# Patient Record
Sex: Female | Born: 1945 | Race: Black or African American | Hispanic: No | Smoking: Never smoker
Health system: Southern US, Community
[De-identification: ages and names within clinical notes are randomized; demographics above are authoritative.]

## PROBLEM LIST (undated history)

## (undated) DIAGNOSIS — I679 Cerebrovascular disease, unspecified: Secondary | ICD-10-CM

## (undated) DIAGNOSIS — I639 Cerebral infarction, unspecified: Secondary | ICD-10-CM

## (undated) DIAGNOSIS — E785 Hyperlipidemia, unspecified: Secondary | ICD-10-CM

## (undated) DIAGNOSIS — F1410 Cocaine abuse, uncomplicated: Secondary | ICD-10-CM

## (undated) DIAGNOSIS — R197 Diarrhea, unspecified: Secondary | ICD-10-CM

## (undated) DIAGNOSIS — I10 Essential (primary) hypertension: Secondary | ICD-10-CM

## (undated) HISTORY — DX: Diarrhea, unspecified: R19.7

## (undated) HISTORY — DX: Hyperlipidemia, unspecified: E78.5

## (undated) HISTORY — DX: Essential (primary) hypertension: I10

## (undated) HISTORY — DX: Cocaine abuse, uncomplicated: F14.10

## (undated) HISTORY — DX: Cerebrovascular disease, unspecified: I67.9

---

## 1968-02-07 HISTORY — PX: TOTAL HIP ARTHROPLASTY: SHX124

## 1997-12-21 ENCOUNTER — Inpatient Hospital Stay (HOSPITAL_COMMUNITY): Admission: RE | Admit: 1997-12-21 | Discharge: 1997-12-29 | Payer: Self-pay | Admitting: Orthopaedic Surgery

## 1997-12-29 ENCOUNTER — Inpatient Hospital Stay (HOSPITAL_COMMUNITY)
Admission: RE | Admit: 1997-12-29 | Discharge: 1998-01-06 | Payer: Self-pay | Admitting: Physical Medicine and Rehabilitation

## 1998-01-25 ENCOUNTER — Encounter: Admission: RE | Admit: 1998-01-25 | Discharge: 1998-02-25 | Payer: Self-pay | Admitting: Orthopaedic Surgery

## 1998-04-01 ENCOUNTER — Encounter: Admission: RE | Admit: 1998-04-01 | Discharge: 1998-06-30 | Payer: Self-pay | Admitting: Orthopaedic Surgery

## 1999-06-29 ENCOUNTER — Encounter: Admission: RE | Admit: 1999-06-29 | Discharge: 1999-08-18 | Payer: Self-pay | Admitting: Orthopaedic Surgery

## 2002-08-19 ENCOUNTER — Encounter: Payer: Self-pay | Admitting: Emergency Medicine

## 2002-08-19 ENCOUNTER — Emergency Department (HOSPITAL_COMMUNITY): Admission: EM | Admit: 2002-08-19 | Discharge: 2002-08-19 | Payer: Self-pay | Admitting: Emergency Medicine

## 2003-12-16 ENCOUNTER — Ambulatory Visit: Payer: Self-pay | Admitting: Family Medicine

## 2004-05-10 ENCOUNTER — Ambulatory Visit: Payer: Self-pay | Admitting: Family Medicine

## 2004-06-15 ENCOUNTER — Ambulatory Visit: Payer: Self-pay | Admitting: Family Medicine

## 2005-08-20 ENCOUNTER — Inpatient Hospital Stay (HOSPITAL_COMMUNITY): Admission: EM | Admit: 2005-08-20 | Discharge: 2005-08-24 | Payer: Self-pay | Admitting: Emergency Medicine

## 2005-08-20 ENCOUNTER — Encounter (INDEPENDENT_AMBULATORY_CARE_PROVIDER_SITE_OTHER): Payer: Self-pay | Admitting: Cardiology

## 2005-08-20 ENCOUNTER — Encounter: Payer: Self-pay | Admitting: Vascular Surgery

## 2005-08-25 ENCOUNTER — Ambulatory Visit: Payer: Self-pay | Admitting: Family Medicine

## 2005-08-28 ENCOUNTER — Encounter: Admission: RE | Admit: 2005-08-28 | Discharge: 2005-08-28 | Payer: Self-pay | Admitting: Internal Medicine

## 2005-09-21 ENCOUNTER — Ambulatory Visit: Payer: Self-pay | Admitting: Family Medicine

## 2005-10-12 ENCOUNTER — Ambulatory Visit: Payer: Self-pay | Admitting: Family Medicine

## 2007-05-29 ENCOUNTER — Ambulatory Visit: Payer: Self-pay | Admitting: Family Medicine

## 2007-05-29 LAB — CONVERTED CEMR LAB
ALT: 10 units/L (ref 0–35)
AST: 11 units/L (ref 0–37)
BUN: 13 mg/dL (ref 6–23)
Basophils Absolute: 0 10*3/uL (ref 0.0–0.1)
Basophils Relative: 0 % (ref 0–1)
Calcium: 9.8 mg/dL (ref 8.4–10.5)
Chloride: 100 meq/L (ref 96–112)
Creatinine, Ser: 0.75 mg/dL (ref 0.40–1.20)
Eosinophils Absolute: 0 10*3/uL (ref 0.0–0.7)
Eosinophils Relative: 1 % (ref 0–5)
HCT: 43.2 % (ref 36.0–46.0)
Hemoglobin: 14.3 g/dL (ref 12.0–15.0)
MCHC: 33.1 g/dL (ref 30.0–36.0)
MCV: 81.1 fL (ref 78.0–100.0)
Monocytes Absolute: 0.4 10*3/uL (ref 0.1–1.0)
Monocytes Relative: 6 % (ref 3–12)
RBC: 5.33 M/uL — ABNORMAL HIGH (ref 3.87–5.11)
RDW: 12.8 % (ref 11.5–15.5)
Total Bilirubin: 0.3 mg/dL (ref 0.3–1.2)

## 2007-05-30 ENCOUNTER — Encounter (INDEPENDENT_AMBULATORY_CARE_PROVIDER_SITE_OTHER): Payer: Self-pay | Admitting: Family Medicine

## 2007-06-03 ENCOUNTER — Telehealth (INDEPENDENT_AMBULATORY_CARE_PROVIDER_SITE_OTHER): Payer: Self-pay | Admitting: *Deleted

## 2007-06-04 ENCOUNTER — Ambulatory Visit: Payer: Self-pay | Admitting: Family Medicine

## 2007-06-04 DIAGNOSIS — R197 Diarrhea, unspecified: Secondary | ICD-10-CM

## 2007-06-04 DIAGNOSIS — F1410 Cocaine abuse, uncomplicated: Secondary | ICD-10-CM | POA: Insufficient documentation

## 2007-06-04 LAB — CONVERTED CEMR LAB: Hgb A1c MFr Bld: 12 %

## 2007-06-05 ENCOUNTER — Telehealth (INDEPENDENT_AMBULATORY_CARE_PROVIDER_SITE_OTHER): Payer: Self-pay | Admitting: *Deleted

## 2007-06-05 DIAGNOSIS — I10 Essential (primary) hypertension: Secondary | ICD-10-CM | POA: Insufficient documentation

## 2007-06-05 DIAGNOSIS — E1129 Type 2 diabetes mellitus with other diabetic kidney complication: Secondary | ICD-10-CM

## 2007-06-05 DIAGNOSIS — E785 Hyperlipidemia, unspecified: Secondary | ICD-10-CM

## 2007-06-05 DIAGNOSIS — I679 Cerebrovascular disease, unspecified: Secondary | ICD-10-CM

## 2007-07-02 ENCOUNTER — Telehealth (INDEPENDENT_AMBULATORY_CARE_PROVIDER_SITE_OTHER): Payer: Self-pay | Admitting: *Deleted

## 2007-08-21 ENCOUNTER — Telehealth (INDEPENDENT_AMBULATORY_CARE_PROVIDER_SITE_OTHER): Payer: Self-pay | Admitting: *Deleted

## 2007-08-29 ENCOUNTER — Encounter (INDEPENDENT_AMBULATORY_CARE_PROVIDER_SITE_OTHER): Payer: Self-pay | Admitting: *Deleted

## 2007-09-30 ENCOUNTER — Ambulatory Visit: Payer: Self-pay | Admitting: Family Medicine

## 2007-09-30 LAB — CONVERTED CEMR LAB: Blood Glucose, Fingerstick: 242

## 2008-06-11 DIAGNOSIS — I639 Cerebral infarction, unspecified: Secondary | ICD-10-CM | POA: Insufficient documentation

## 2008-06-12 ENCOUNTER — Ambulatory Visit: Payer: Self-pay | Admitting: Surgery

## 2008-06-12 ENCOUNTER — Ambulatory Visit: Payer: Self-pay | Admitting: Cardiovascular Disease

## 2008-06-12 ENCOUNTER — Inpatient Hospital Stay (HOSPITAL_COMMUNITY): Admission: EM | Admit: 2008-06-12 | Discharge: 2008-06-15 | Payer: Self-pay | Admitting: Emergency Medicine

## 2008-06-12 ENCOUNTER — Encounter (INDEPENDENT_AMBULATORY_CARE_PROVIDER_SITE_OTHER): Payer: Self-pay | Admitting: Internal Medicine

## 2008-06-15 ENCOUNTER — Encounter (INDEPENDENT_AMBULATORY_CARE_PROVIDER_SITE_OTHER): Payer: Self-pay | Admitting: Internal Medicine

## 2008-06-16 ENCOUNTER — Telehealth (INDEPENDENT_AMBULATORY_CARE_PROVIDER_SITE_OTHER): Payer: Self-pay | Admitting: Internal Medicine

## 2008-06-23 ENCOUNTER — Encounter (INDEPENDENT_AMBULATORY_CARE_PROVIDER_SITE_OTHER): Payer: Self-pay | Admitting: Family Medicine

## 2008-06-23 ENCOUNTER — Ambulatory Visit: Payer: Self-pay | Admitting: Internal Medicine

## 2008-06-23 LAB — CONVERTED CEMR LAB: Blood Glucose, Fingerstick: 85

## 2008-06-25 ENCOUNTER — Telehealth (INDEPENDENT_AMBULATORY_CARE_PROVIDER_SITE_OTHER): Payer: Self-pay | Admitting: Internal Medicine

## 2008-07-01 ENCOUNTER — Ambulatory Visit: Payer: Self-pay | Admitting: Internal Medicine

## 2008-07-03 ENCOUNTER — Emergency Department (HOSPITAL_COMMUNITY): Admission: EM | Admit: 2008-07-03 | Discharge: 2008-07-03 | Payer: Self-pay | Admitting: Emergency Medicine

## 2008-07-09 ENCOUNTER — Encounter (INDEPENDENT_AMBULATORY_CARE_PROVIDER_SITE_OTHER): Payer: Self-pay | Admitting: Internal Medicine

## 2008-07-27 ENCOUNTER — Encounter: Admission: RE | Admit: 2008-07-27 | Discharge: 2008-10-25 | Payer: Self-pay | Admitting: Internal Medicine

## 2008-07-31 ENCOUNTER — Ambulatory Visit: Payer: Self-pay | Admitting: Internal Medicine

## 2008-07-31 LAB — CONVERTED CEMR LAB: Hgb A1c MFr Bld: 9 %

## 2008-08-05 ENCOUNTER — Encounter (INDEPENDENT_AMBULATORY_CARE_PROVIDER_SITE_OTHER): Payer: Self-pay | Admitting: Internal Medicine

## 2008-08-06 ENCOUNTER — Encounter (INDEPENDENT_AMBULATORY_CARE_PROVIDER_SITE_OTHER): Payer: Self-pay | Admitting: Internal Medicine

## 2008-08-12 ENCOUNTER — Encounter (INDEPENDENT_AMBULATORY_CARE_PROVIDER_SITE_OTHER): Payer: Self-pay | Admitting: Internal Medicine

## 2008-08-12 ENCOUNTER — Telehealth (INDEPENDENT_AMBULATORY_CARE_PROVIDER_SITE_OTHER): Payer: Self-pay | Admitting: Internal Medicine

## 2008-08-26 ENCOUNTER — Encounter (INDEPENDENT_AMBULATORY_CARE_PROVIDER_SITE_OTHER): Payer: Self-pay | Admitting: Internal Medicine

## 2008-08-31 ENCOUNTER — Encounter (INDEPENDENT_AMBULATORY_CARE_PROVIDER_SITE_OTHER): Payer: Self-pay | Admitting: Internal Medicine

## 2008-09-11 ENCOUNTER — Encounter (INDEPENDENT_AMBULATORY_CARE_PROVIDER_SITE_OTHER): Payer: Self-pay | Admitting: Internal Medicine

## 2008-09-23 ENCOUNTER — Ambulatory Visit: Payer: Self-pay | Admitting: Infectious Disease

## 2008-09-23 ENCOUNTER — Inpatient Hospital Stay (HOSPITAL_COMMUNITY): Admission: EM | Admit: 2008-09-23 | Discharge: 2008-09-29 | Payer: Self-pay | Admitting: Emergency Medicine

## 2008-09-24 ENCOUNTER — Encounter (INDEPENDENT_AMBULATORY_CARE_PROVIDER_SITE_OTHER): Payer: Self-pay | Admitting: Internal Medicine

## 2008-09-25 ENCOUNTER — Encounter: Payer: Self-pay | Admitting: Infectious Disease

## 2008-09-25 ENCOUNTER — Ambulatory Visit: Payer: Self-pay | Admitting: Physical Medicine & Rehabilitation

## 2008-10-13 ENCOUNTER — Emergency Department (HOSPITAL_COMMUNITY): Admission: EM | Admit: 2008-10-13 | Discharge: 2008-10-14 | Payer: Self-pay | Admitting: Emergency Medicine

## 2008-12-04 ENCOUNTER — Encounter (INDEPENDENT_AMBULATORY_CARE_PROVIDER_SITE_OTHER): Payer: Self-pay | Admitting: Internal Medicine

## 2008-12-25 ENCOUNTER — Encounter (INDEPENDENT_AMBULATORY_CARE_PROVIDER_SITE_OTHER): Payer: Self-pay | Admitting: Internal Medicine

## 2008-12-25 ENCOUNTER — Telehealth (INDEPENDENT_AMBULATORY_CARE_PROVIDER_SITE_OTHER): Payer: Self-pay | Admitting: Internal Medicine

## 2008-12-25 DIAGNOSIS — R1310 Dysphagia, unspecified: Secondary | ICD-10-CM | POA: Insufficient documentation

## 2009-01-05 ENCOUNTER — Telehealth (INDEPENDENT_AMBULATORY_CARE_PROVIDER_SITE_OTHER): Payer: Self-pay | Admitting: Internal Medicine

## 2009-01-08 ENCOUNTER — Telehealth (INDEPENDENT_AMBULATORY_CARE_PROVIDER_SITE_OTHER): Payer: Self-pay | Admitting: Internal Medicine

## 2009-01-12 ENCOUNTER — Encounter (INDEPENDENT_AMBULATORY_CARE_PROVIDER_SITE_OTHER): Payer: Self-pay | Admitting: Internal Medicine

## 2009-01-14 ENCOUNTER — Ambulatory Visit: Payer: Self-pay | Admitting: Internal Medicine

## 2009-01-14 DIAGNOSIS — G4700 Insomnia, unspecified: Secondary | ICD-10-CM | POA: Insufficient documentation

## 2009-01-14 DIAGNOSIS — H814 Vertigo of central origin: Secondary | ICD-10-CM

## 2009-01-14 LAB — CONVERTED CEMR LAB: Blood Glucose, Fingerstick: 177

## 2009-01-15 ENCOUNTER — Encounter (INDEPENDENT_AMBULATORY_CARE_PROVIDER_SITE_OTHER): Payer: Self-pay | Admitting: Internal Medicine

## 2009-01-22 ENCOUNTER — Encounter (INDEPENDENT_AMBULATORY_CARE_PROVIDER_SITE_OTHER): Payer: Self-pay | Admitting: Internal Medicine

## 2009-01-22 ENCOUNTER — Telehealth (INDEPENDENT_AMBULATORY_CARE_PROVIDER_SITE_OTHER): Payer: Self-pay | Admitting: Internal Medicine

## 2009-01-25 ENCOUNTER — Ambulatory Visit: Admission: RE | Admit: 2009-01-25 | Discharge: 2009-01-25 | Payer: Self-pay | Admitting: Internal Medicine

## 2009-01-25 ENCOUNTER — Ambulatory Visit (HOSPITAL_COMMUNITY): Admission: RE | Admit: 2009-01-25 | Discharge: 2009-01-25 | Payer: Self-pay | Admitting: Internal Medicine

## 2009-01-25 LAB — CONVERTED CEMR LAB
AST: 19 units/L (ref 0–37)
Alkaline Phosphatase: 53 units/L (ref 39–117)
BUN: 24 mg/dL — ABNORMAL HIGH (ref 6–23)
Glucose, Bld: 169 mg/dL — ABNORMAL HIGH (ref 70–99)
HDL: 56 mg/dL (ref >39)
LDL Cholesterol: 114 mg/dL — ABNORMAL HIGH (ref 0–99)
Total Bilirubin: 0.3 mg/dL (ref 0.3–1.2)
Total CHOL/HDL Ratio: 3.5
Triglycerides: 132 mg/dL (ref <150)
VLDL: 26 mg/dL (ref 0–40)

## 2009-01-27 ENCOUNTER — Encounter (INDEPENDENT_AMBULATORY_CARE_PROVIDER_SITE_OTHER): Payer: Self-pay | Admitting: Internal Medicine

## 2009-02-01 ENCOUNTER — Encounter (INDEPENDENT_AMBULATORY_CARE_PROVIDER_SITE_OTHER): Payer: Self-pay | Admitting: Internal Medicine

## 2009-02-09 ENCOUNTER — Ambulatory Visit: Payer: Self-pay | Admitting: Internal Medicine

## 2009-02-09 LAB — CONVERTED CEMR LAB
Basophils Absolute: 0 10*3/uL (ref 0.0–0.1)
Basophils Relative: 0 % (ref 0–1)
Eosinophils Absolute: 0 10*3/uL (ref 0.0–0.7)
MCHC: 31.6 g/dL (ref 30.0–36.0)
MCV: 84.6 fL (ref 78.0–100.0)
Monocytes Relative: 10 % (ref 3–12)
Neutrophils Relative %: 47 % (ref 43–77)
Platelets: 318 10*3/uL (ref 150–400)
RDW: 13 % (ref 11.5–15.5)

## 2009-02-10 ENCOUNTER — Encounter (INDEPENDENT_AMBULATORY_CARE_PROVIDER_SITE_OTHER): Payer: Self-pay | Admitting: Internal Medicine

## 2009-02-17 ENCOUNTER — Encounter (INDEPENDENT_AMBULATORY_CARE_PROVIDER_SITE_OTHER): Payer: Self-pay | Admitting: Internal Medicine

## 2009-02-22 ENCOUNTER — Telehealth (INDEPENDENT_AMBULATORY_CARE_PROVIDER_SITE_OTHER): Payer: Self-pay | Admitting: Internal Medicine

## 2009-02-23 ENCOUNTER — Encounter (INDEPENDENT_AMBULATORY_CARE_PROVIDER_SITE_OTHER): Payer: Self-pay | Admitting: Internal Medicine

## 2009-02-25 ENCOUNTER — Encounter (INDEPENDENT_AMBULATORY_CARE_PROVIDER_SITE_OTHER): Payer: Self-pay | Admitting: Internal Medicine

## 2009-02-28 ENCOUNTER — Encounter (INDEPENDENT_AMBULATORY_CARE_PROVIDER_SITE_OTHER): Payer: Self-pay | Admitting: Internal Medicine

## 2009-03-08 ENCOUNTER — Telehealth (INDEPENDENT_AMBULATORY_CARE_PROVIDER_SITE_OTHER): Payer: Self-pay | Admitting: Internal Medicine

## 2009-03-11 ENCOUNTER — Encounter (INDEPENDENT_AMBULATORY_CARE_PROVIDER_SITE_OTHER): Payer: Self-pay | Admitting: Internal Medicine

## 2009-03-25 ENCOUNTER — Telehealth (INDEPENDENT_AMBULATORY_CARE_PROVIDER_SITE_OTHER): Payer: Self-pay | Admitting: Internal Medicine

## 2009-04-02 ENCOUNTER — Telehealth (INDEPENDENT_AMBULATORY_CARE_PROVIDER_SITE_OTHER): Payer: Self-pay | Admitting: Internal Medicine

## 2009-04-06 ENCOUNTER — Encounter (INDEPENDENT_AMBULATORY_CARE_PROVIDER_SITE_OTHER): Payer: Self-pay | Admitting: Internal Medicine

## 2009-04-06 ENCOUNTER — Telehealth (INDEPENDENT_AMBULATORY_CARE_PROVIDER_SITE_OTHER): Payer: Self-pay | Admitting: Internal Medicine

## 2009-04-14 ENCOUNTER — Encounter (INDEPENDENT_AMBULATORY_CARE_PROVIDER_SITE_OTHER): Payer: Self-pay | Admitting: Internal Medicine

## 2009-04-20 ENCOUNTER — Encounter: Admission: RE | Admit: 2009-04-20 | Discharge: 2009-05-24 | Payer: Self-pay | Admitting: Internal Medicine

## 2009-04-20 ENCOUNTER — Encounter (INDEPENDENT_AMBULATORY_CARE_PROVIDER_SITE_OTHER): Payer: Self-pay | Admitting: Internal Medicine

## 2009-04-26 ENCOUNTER — Encounter (INDEPENDENT_AMBULATORY_CARE_PROVIDER_SITE_OTHER): Payer: Self-pay | Admitting: Internal Medicine

## 2009-05-19 ENCOUNTER — Encounter (INDEPENDENT_AMBULATORY_CARE_PROVIDER_SITE_OTHER): Payer: Self-pay | Admitting: Internal Medicine

## 2009-06-24 ENCOUNTER — Ambulatory Visit: Payer: Self-pay | Admitting: Internal Medicine

## 2009-06-24 DIAGNOSIS — F329 Major depressive disorder, single episode, unspecified: Secondary | ICD-10-CM

## 2009-06-24 DIAGNOSIS — F3289 Other specified depressive episodes: Secondary | ICD-10-CM | POA: Insufficient documentation

## 2009-06-24 DIAGNOSIS — M25559 Pain in unspecified hip: Secondary | ICD-10-CM | POA: Insufficient documentation

## 2009-06-29 ENCOUNTER — Ambulatory Visit (HOSPITAL_COMMUNITY): Admission: RE | Admit: 2009-06-29 | Discharge: 2009-06-29 | Payer: Self-pay | Admitting: Internal Medicine

## 2009-07-03 DIAGNOSIS — M1610 Unilateral primary osteoarthritis, unspecified hip: Secondary | ICD-10-CM | POA: Insufficient documentation

## 2009-07-03 DIAGNOSIS — M169 Osteoarthritis of hip, unspecified: Secondary | ICD-10-CM | POA: Insufficient documentation

## 2009-07-03 LAB — CONVERTED CEMR LAB
LDL Cholesterol: 111 mg/dL — ABNORMAL HIGH (ref 0–99)
Triglycerides: 166 mg/dL — ABNORMAL HIGH (ref <150)
VLDL: 33 mg/dL (ref 0–40)

## 2009-07-06 ENCOUNTER — Ambulatory Visit: Payer: Self-pay | Admitting: Internal Medicine

## 2009-07-07 ENCOUNTER — Encounter (INDEPENDENT_AMBULATORY_CARE_PROVIDER_SITE_OTHER): Payer: Self-pay | Admitting: Internal Medicine

## 2009-07-13 ENCOUNTER — Encounter (INDEPENDENT_AMBULATORY_CARE_PROVIDER_SITE_OTHER): Payer: Self-pay | Admitting: Internal Medicine

## 2009-07-21 ENCOUNTER — Encounter: Admission: RE | Admit: 2009-07-21 | Discharge: 2009-10-19 | Payer: Self-pay | Admitting: Internal Medicine

## 2009-07-23 ENCOUNTER — Encounter (INDEPENDENT_AMBULATORY_CARE_PROVIDER_SITE_OTHER): Payer: Self-pay | Admitting: Internal Medicine

## 2009-07-26 ENCOUNTER — Telehealth (INDEPENDENT_AMBULATORY_CARE_PROVIDER_SITE_OTHER): Payer: Self-pay | Admitting: Internal Medicine

## 2009-07-28 ENCOUNTER — Telehealth (INDEPENDENT_AMBULATORY_CARE_PROVIDER_SITE_OTHER): Payer: Self-pay | Admitting: Internal Medicine

## 2009-07-29 ENCOUNTER — Encounter (INDEPENDENT_AMBULATORY_CARE_PROVIDER_SITE_OTHER): Payer: Self-pay | Admitting: Internal Medicine

## 2009-08-05 ENCOUNTER — Telehealth (INDEPENDENT_AMBULATORY_CARE_PROVIDER_SITE_OTHER): Payer: Self-pay | Admitting: Internal Medicine

## 2009-08-10 ENCOUNTER — Encounter (INDEPENDENT_AMBULATORY_CARE_PROVIDER_SITE_OTHER): Payer: Self-pay | Admitting: Internal Medicine

## 2009-08-17 ENCOUNTER — Encounter (INDEPENDENT_AMBULATORY_CARE_PROVIDER_SITE_OTHER): Payer: Self-pay | Admitting: Internal Medicine

## 2009-09-02 ENCOUNTER — Encounter (INDEPENDENT_AMBULATORY_CARE_PROVIDER_SITE_OTHER): Payer: Self-pay | Admitting: Internal Medicine

## 2009-09-15 ENCOUNTER — Encounter (INDEPENDENT_AMBULATORY_CARE_PROVIDER_SITE_OTHER): Payer: Self-pay | Admitting: Internal Medicine

## 2009-09-21 ENCOUNTER — Encounter (INDEPENDENT_AMBULATORY_CARE_PROVIDER_SITE_OTHER): Payer: Self-pay | Admitting: Internal Medicine

## 2009-09-27 ENCOUNTER — Encounter (INDEPENDENT_AMBULATORY_CARE_PROVIDER_SITE_OTHER): Payer: Self-pay | Admitting: Internal Medicine

## 2009-10-01 ENCOUNTER — Encounter (INDEPENDENT_AMBULATORY_CARE_PROVIDER_SITE_OTHER): Payer: Self-pay | Admitting: Internal Medicine

## 2009-10-01 ENCOUNTER — Ambulatory Visit (HOSPITAL_COMMUNITY): Admission: RE | Admit: 2009-10-01 | Discharge: 2009-10-01 | Payer: Self-pay | Admitting: Internal Medicine

## 2009-10-12 ENCOUNTER — Encounter (INDEPENDENT_AMBULATORY_CARE_PROVIDER_SITE_OTHER): Payer: Self-pay | Admitting: Internal Medicine

## 2009-11-16 ENCOUNTER — Telehealth (INDEPENDENT_AMBULATORY_CARE_PROVIDER_SITE_OTHER): Payer: Self-pay | Admitting: Internal Medicine

## 2009-11-16 ENCOUNTER — Ambulatory Visit: Payer: Self-pay | Admitting: Internal Medicine

## 2009-11-16 DIAGNOSIS — K5900 Constipation, unspecified: Secondary | ICD-10-CM | POA: Insufficient documentation

## 2009-11-18 ENCOUNTER — Encounter (INDEPENDENT_AMBULATORY_CARE_PROVIDER_SITE_OTHER): Payer: Self-pay | Admitting: Internal Medicine

## 2009-11-23 ENCOUNTER — Ambulatory Visit: Payer: Self-pay | Admitting: Internal Medicine

## 2009-11-25 ENCOUNTER — Telehealth (INDEPENDENT_AMBULATORY_CARE_PROVIDER_SITE_OTHER): Payer: Self-pay | Admitting: Internal Medicine

## 2009-11-25 ENCOUNTER — Emergency Department (HOSPITAL_COMMUNITY): Admission: EM | Admit: 2009-11-25 | Discharge: 2009-11-25 | Payer: Self-pay | Admitting: Emergency Medicine

## 2009-11-29 LAB — HM DIABETES EYE EXAM

## 2009-12-02 ENCOUNTER — Telehealth (INDEPENDENT_AMBULATORY_CARE_PROVIDER_SITE_OTHER): Payer: Self-pay | Admitting: Internal Medicine

## 2009-12-09 ENCOUNTER — Encounter (INDEPENDENT_AMBULATORY_CARE_PROVIDER_SITE_OTHER): Payer: Self-pay | Admitting: *Deleted

## 2009-12-10 ENCOUNTER — Encounter (INDEPENDENT_AMBULATORY_CARE_PROVIDER_SITE_OTHER): Payer: Self-pay | Admitting: Internal Medicine

## 2009-12-10 ENCOUNTER — Inpatient Hospital Stay (HOSPITAL_COMMUNITY): Admission: EM | Admit: 2009-12-10 | Discharge: 2009-12-13 | Payer: Self-pay | Admitting: Emergency Medicine

## 2009-12-14 ENCOUNTER — Inpatient Hospital Stay (HOSPITAL_COMMUNITY): Admission: EM | Admit: 2009-12-14 | Discharge: 2009-12-17 | Payer: Self-pay | Admitting: Emergency Medicine

## 2009-12-15 ENCOUNTER — Ambulatory Visit: Payer: Self-pay | Admitting: Vascular Surgery

## 2009-12-15 ENCOUNTER — Encounter (INDEPENDENT_AMBULATORY_CARE_PROVIDER_SITE_OTHER): Payer: Self-pay | Admitting: Internal Medicine

## 2009-12-16 ENCOUNTER — Encounter (INDEPENDENT_AMBULATORY_CARE_PROVIDER_SITE_OTHER): Payer: Self-pay | Admitting: Internal Medicine

## 2009-12-16 ENCOUNTER — Ambulatory Visit: Payer: Self-pay | Admitting: Internal Medicine

## 2009-12-17 ENCOUNTER — Telehealth (INDEPENDENT_AMBULATORY_CARE_PROVIDER_SITE_OTHER): Payer: Self-pay | Admitting: Internal Medicine

## 2009-12-29 ENCOUNTER — Telehealth (INDEPENDENT_AMBULATORY_CARE_PROVIDER_SITE_OTHER): Payer: Self-pay | Admitting: Internal Medicine

## 2010-01-03 ENCOUNTER — Ambulatory Visit: Payer: Self-pay | Admitting: Internal Medicine

## 2010-01-03 LAB — CONVERTED CEMR LAB: Blood Glucose, Fingerstick: 364

## 2010-01-03 LAB — HM DIABETES FOOT EXAM

## 2010-01-05 ENCOUNTER — Telehealth: Payer: Self-pay | Admitting: Internal Medicine

## 2010-01-07 ENCOUNTER — Encounter (INDEPENDENT_AMBULATORY_CARE_PROVIDER_SITE_OTHER): Payer: Self-pay | Admitting: Internal Medicine

## 2010-01-11 ENCOUNTER — Encounter: Payer: Self-pay | Admitting: Internal Medicine

## 2010-01-17 ENCOUNTER — Telehealth: Payer: Self-pay | Admitting: Internal Medicine

## 2010-01-24 ENCOUNTER — Telehealth: Payer: Self-pay | Admitting: Internal Medicine

## 2010-01-25 ENCOUNTER — Ambulatory Visit: Payer: Self-pay | Admitting: Internal Medicine

## 2010-01-25 DIAGNOSIS — H01009 Unspecified blepharitis unspecified eye, unspecified eyelid: Secondary | ICD-10-CM | POA: Insufficient documentation

## 2010-02-15 ENCOUNTER — Ambulatory Visit: Admit: 2010-02-15 | Payer: Self-pay | Admitting: Internal Medicine

## 2010-02-27 ENCOUNTER — Encounter: Payer: Self-pay | Admitting: Internal Medicine

## 2010-03-07 ENCOUNTER — Ambulatory Visit: Admit: 2010-03-07 | Payer: Self-pay | Admitting: Internal Medicine

## 2010-03-10 NOTE — Letter (Signed)
Summary: SUMMARY REPORT  SUMMARY REPORT   Imported By: Roland Earl 04/26/2009 09:12:32  _____________________________________________________________________  External Attachment:    Type:   Image     Comment:   External Document

## 2010-03-10 NOTE — Letter (Signed)
Summary: REFERRAL//[facility]ECH [facility]ERAP[facility]//APPT DATE & TIME  REFERRAL//[facility]//APPT DATE & TIME   Imported By: Roland Earl 07/19/2009 15:00:38  _____________________________________________________________________  External Attachment:    Type:   Image     Comment:   External Document

## 2010-03-10 NOTE — Letter (Signed)
Summary: [facility] FEEDBACK   Imported By: Roland Earl 07/09/2009 09:54:29  _____________________________________________________________________  External Attachment:    Type:   Image     Comment:   External Document

## 2010-03-10 NOTE — Progress Notes (Signed)
Summary: Rx refill req  Phone Note Call from Patient   Caller: [phone] - Daughter Rosaria Ferries  Summary of Call: Daughter is req a call back regarding pt's medications.  Initial call taken by: Charlsie Quest, [place],  January 05, 2010 3:46 PM  Follow-up for Phone Call        Pt's daughter called requesting refill of Amplodipine Follow-up by: Crissie Sickles, CMA,  January 06, 2010 11:13 AM    Prescriptions: AMLODIPINE BESYLATE 10 MG TABS (AMLODIPINE BESYLATE) 1 by mouth once daily  #30 x 11   Entered by:   Crissie Sickles, CMA   Authorized by:   Janith Lima MD   Signed by:   Crissie Sickles, CMA on 01/06/2010   Method used:   [facility]        [facility] [address] [facility] [address] [phone]* (retail)       [address]       [place], [place]  [address]       Ph: [phone] or [phone]       Fax: [phone]   RxID:   [id number]

## 2010-03-10 NOTE — Letter (Signed)
Summary: DURABLE MEDICAL EQUIPMENT //FAXED  DURABLE MEDICAL EQUIPMENT //FAXED   Imported By: Roland Earl 01/07/2010 12:18:27  _____________________________________________________________________  External Attachment:    Type:   Image     Comment:   External Document

## 2010-03-10 NOTE — Letter (Signed)
Summary: TEST ORDER FORM//MOD BARIUM SWALLOW //APPT DATE & TIME  TEST ORDER FORM//MOD BARIUM SWALLOW //APPT DATE & TIME   Imported By: Roland Earl 03/02/2009 14:59:33  _____________________________________________________________________  External Attachment:    Type:   Image     Comment:   External Document

## 2010-03-10 NOTE — Letter (Signed)
Summary: FAXED [facility]//PRESCRIPTION FOR COMMODE  FAXED [facility]//PRESCRIPTION FOR COMMODE   Imported By: Roland Earl 04/26/2009 09:19:17  _____________________________________________________________________  External Attachment:    Type:   Image     Comment:   External Document

## 2010-03-10 NOTE — Letter (Signed)
Summary: PT PROGRESS REPORT//[facility]  PT PROGRESS REPORT//[facility]   Imported By: Roland Earl 03/18/2009 15:56:11  _____________________________________________________________________  External Attachment:    Type:   Image     Comment:   External Document

## 2010-03-10 NOTE — Progress Notes (Signed)
Summary: Query:  Refill Meclizine?  Phone Note Outgoing Call   Summary of Call: Do you want to refill her meclizine for 6 months through [facility]?  Last seen 11/2009. Initial call taken by: Sherian Maroon RN,  December 17, 2009 11:05 AM  Follow-up for Phone Call        That's fine. Follow-up by: Mack Hook MD,  December 17, 2009 1:47 PM  Additional Follow-up for Phone Call Additional follow up Details #1::        Noted.  Refill completed.  Sherian Maroon RN  December 17, 2009 4:41 PM     Prescriptions: MECLIZINE HCL 25 MG TABS (MECLIZINE HCL) 1 by mouth q 6hours as needed  #30 x 5   Entered by:   Sherian Maroon RN   Authorized by:   Mack Hook MD   Signed by:   Sherian Maroon RN on 12/17/2009   Method used:   Historical   RxID:   [id number]

## 2010-03-10 NOTE — Letter (Signed)
Summary: [facility]//OPHTHALOLOGY  [facility]//OPHTHALOLOGY   Imported By: Roland Earl 01/07/2010 12:31:54  _____________________________________________________________________  External Attachment:    Type:   Image     Comment:   External Document

## 2010-03-10 NOTE — Letter (Signed)
Summary: PERSONAL CARE SERVICES  PERSONAL CARE SERVICES   Imported By: Roland Earl 07/06/2009 11:00:05  _____________________________________________________________________  External Attachment:    Type:   Image     Comment:   External Document

## 2010-03-10 NOTE — Letter (Signed)
Summary: BARIUM SWALLOW STUDY  BARIUM SWALLOW STUDY   Imported By: Roland Earl 07/06/2009 11:27:54  _____________________________________________________________________  External Attachment:    Type:   Image     Comment:   External Document

## 2010-03-10 NOTE — Progress Notes (Signed)
  Phone Note Outgoing Call   Summary of Call: Called Speech Therapy last week--pt. actually not in need of another study to evaluate swallowing, but reeducation as she is now living with another daughter that is not aware of what she is supposed to be doing with dysphagia diet.  Called and spoke with Speech again today and they will take care of it.   Initial call taken by: Mack Hook MD,  July 26, 2009 8:27 AM

## 2010-03-10 NOTE — Miscellaneous (Signed)
Summary: [facility]   Imported By: Roland Earl 06/24/2009 12:55:46  _____________________________________________________________________  External Attachment:    Type:   Image     Comment:   External Document

## 2010-03-10 NOTE — Assessment & Plan Note (Signed)
Summary: eye infection?/SD   Vital Signs:  Patient profile:   65 year old female Menstrual status:  postmenopausal Height:      60.5 inches Weight:      137 pounds BMI:     26.41 O2 Sat:      97 % on Room air Temp:     98.5 degrees F rectal Pulse rate:   64 / minute Pulse rhythm:   regular Resp:     16 per minute BP sitting:   116 / 68  (left arm) Cuff size:   large  Vitals Entered By: Estell Harpin CMA (January 25, 2010 8:52 AM)  Nutrition Counseling: Patient's BMI is greater than 25 and therefore counseled on weight management options.  O2 Flow:  Room air CC: Patient c/o watery, sore eays w/ redness, Hypertension Management Is Patient Diabetic? Yes Did you bring your meter with you today? No  Does patient need assistance? Functional Status Self care Ambulation Normal     Menstrual Status postmenopausal   Primary Care Nakina Spatz:  Janith Lima MD  CC:  Patient c/o watery, sore eays w/ redness, and Hypertension Management.  History of Present Illness: She returns c/o redness and irritation in both eyes for several months but she states that it has recently worsened. She tells me that the symptoms started after Dr. Zadie Rhine did a surgery on the right eye to repair some diabetic damage. She is putting a tear replacement drop in her eyes but she denies using any other eye drops, she does not wear contacts, and has not had an eye injury.  Hypertension History:      She denies headache, chest pain, palpitations, dyspnea with exertion, orthopnea, PND, peripheral edema, visual symptoms, neurologic problems, syncope, and side effects from treatment.  She notes no problems with any antihypertensive medication side effects.        Positive major cardiovascular risk factors include female age 50 years old or older, diabetes, hyperlipidemia, and hypertension.  Negative major cardiovascular risk factors include negative family history for ischemic heart disease and non-tobacco-user  status.        Positive history for target organ damage include prior stroke (or TIA).  Further assessment for target organ damage reveals no history of ASHD, cardiac end-organ damage (CHF/LVH), peripheral vascular disease, renal insufficiency, or hypertensive retinopathy.    Current Medications (verified): 1)  Glucometer and Supplies .... 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 Take 1 Tablet By Mouth Once A Day For Stroke Prevention 3)  Amlodipine Besylate 10 Mg Tabs (Amlodipine Besylate) .Marland Kitchen.. 1 By Mouth Once Daily 4)  Glipizide 10 Mg Tabs (Glipizide) .... 1/2 Tab By Mouth Two Times A Day 5)  Metformin Hcl 1000 Mg Tabs (Metformin Hcl) .Marland Kitchen.. 1 Tab By Mouth Two Times A Day 6)  Benazepril Hcl 10 Mg Tabs (Benazepril Hcl) .Marland Kitchen.. 1 Tab By Mouth Daily 7)  Hydrochlorothiazide 12.5 Mg Tabs (Hydrochlorothiazide) .Marland Kitchen.. 1 Tab By Mouth in Morning. 8)  Actos 30 Mg Tabs (Pioglitazone Hcl) .Marland Kitchen.. 1 Tab By Mouth Daily 9)  Plavix 75 Mg Tabs (Clopidogrel Bisulfate) .Marland Kitchen.. 1 By Mouth Once Daily 10)  Meclizine Hcl 25 Mg Tabs (Meclizine Hcl) .Marland Kitchen.. 1 By Mouth Q 6hours As Needed 11)  One Touch Ultra Test Strips .... Test Three Times A Day 12)  Lorazepam 1 Mg Tabs (Lorazepam) .... 1/2 To 1 Tab By Mouth Every 8 Hours As Needed For Anxiety 13)  Tub Bench .Marland Kitchen.. 386.2 and V12.59 14)  Miralax  Powd (Polyethylene Glycol 3350) .Marland KitchenMarland KitchenMarland Kitchen 17 G in 8 Oz Fluid By Mouth Daily--Decrease To 1/2 Dose If Stools Become Too Loose 15)  Flomax 0.4 Mg Caps (Tamsulosin Hcl) .... Take 1 Tablet By Mouth Once A Day  Allergies (verified): 1)  ! Morphine 2)  ! Simvastatin 3)  Pravastatin Sodium (Pravastatin Sodium)  Past History:  Past Medical History: Last updated: 06/04/2007 Current Problems:  HYPERLIPIDEMIA (ICD-272.4) CEREBROVASCULAR DISEASE (ICD-437.9) DIARRHEA, ACUTE (ICD-787.91) COCAINE ABUSE (ICD-305.60) ESSENTIAL HYPERTENSION, BENIGN (ICD-401.1) DIABETES MELLITUS, TYPE II,  UNCONTROLLED (ICD-250.02)  Past Surgical History: Last updated: 07/03/2009 1.  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 Dr.Mark Lorin Mercy  Family History: Last updated: 01/03/2010 Family History Diabetes 1st degree relative Family History of Stroke F 1st degree relative <60 Family History of Stroke M 1st degree relative <50  Social History: Last updated: 01/03/2010 Retired Divorced Never Smoked Alcohol use-no Drug use-no Regular exercise-yes  Risk Factors: Alcohol Use: 0 (01/14/2009) Caffeine Use: not every day (06/04/2007) Exercise: yes (01/03/2010)  Risk Factors: Smoking Status: never (01/03/2010)  Family History: Reviewed history from 01/03/2010 and no changes required. Family History Diabetes 1st degree relative Family History of Stroke F 1st degree relative <60 Family History of Stroke M 1st degree relative <50  Social History: Reviewed history from 01/03/2010 and no changes required. Retired Divorced Never Smoked Alcohol use-no Drug use-no Regular exercise-yes  Review of Systems  The patient denies anorexia, fever, weight loss, weight gain, chest pain, syncope, dyspnea on exertion, peripheral edema, prolonged cough, headaches, hemoptysis, abdominal pain, suspicious skin lesions, transient blindness, unusual weight change, abnormal bleeding, and enlarged lymph nodes.    Physical Exam  General:  alert, well-developed, well-nourished, well-hydrated, appropriate dress, normal appearance, healthy-appearing, cooperative to examination, and good hygiene.   Head:  she has multiple healed facial lesions that are small hypopigmented papules and two small scabs over her left eyebrow. there are no vesicles, pustules, or targets today. there is no swelling or angioedema. Eyes:  both eyes are injected with involvement of the lid margins and palpebral conjunctival surfaces, there is no exudate and no lesions on the corneal epithelial surfaces or the lid margins. there is no scleritis or  episcleritis Ears:  R ear normal and L ear normal.   Mouth:  Oral mucosa and oropharynx without lesions or exudates.  Teeth in good repair. Neck:  supple, full ROM, no masses, no thyromegaly, no thyroid nodules or tenderness, no JVD, normal carotid upstroke, no carotid bruits, no cervical lymphadenopathy, and no neck tenderness.   Lungs:  normal respiratory effort, no intercostal retractions, no accessory muscle use, normal breath sounds, no dullness, no fremitus, no crackles, and no wheezes.   Heart:  normal rate, regular rhythm, no murmur, no gallop, no rub, and no JVD.   Abdomen:  soft, non-tender, normal bowel sounds, no distention, no masses, no guarding, no rigidity, no rebound tenderness, no abdominal hernia, no inguinal hernia, no hepatomegaly, and no splenomegaly.   Msk:  No deformity or scoliosis noted of thoracic or lumbar spine.   Pulses:  R and L carotid,radial,femoral,dorsalis pedis and posterior tibial pulses are full and equal bilaterally Extremities:  No clubbing, cyanosis, edema, or deformity noted with normal full range of motion of all joints.   Neurologic:  alert & oriented X3, cranial nerves II-XII intact, Romberg negative, abnormal gait, LUE hyperreflexia, LUE weakness, LLE hyperreflexia, and LLE weakness.   Skin:  turgor normal, no suspicious lesions, no ecchymoses, no petechiae,  no purpura, no ulcerations, and no edema.   Cervical Nodes:  no anterior cervical adenopathy and no posterior cervical adenopathy.   Axillary Nodes:  no R axillary adenopathy and no L axillary adenopathy.   Psych:  Oriented X3, memory intact for recent and remote, normally interactive, good eye contact, not anxious appearing, not depressed appearing, not agitated, not suicidal, and not homicidal.     Impression & Recommendations:  Problem # 1:  BLEPHARITIS, UNSPECIFIED (ICD-373.00) Assessment New start blephamide drops and see Dr. Zadie Rhine soon Orders: Ophthalmology Referral  (Ophthalmology)  Problem # 2:  ESSENTIAL HYPERTENSION, BENIGN (ICD-401.1) Assessment: Improved  Her updated medication list for this problem includes:    Amlodipine Besylate 10 Mg Tabs (Amlodipine besylate) .Marland Kitchen... 1 by mouth once daily    Benazepril Hcl 10 Mg Tabs (Benazepril hcl) .Marland Kitchen... 1 tab by mouth daily    Hydrochlorothiazide 12.5 Mg Tabs (Hydrochlorothiazide) .Marland Kitchen... 1 tab by mouth in morning.  BP today: 116/68 Prior BP: 142/74 (01/03/2010)  Labs Reviewed: K+: 5.1 (01/14/2009) Creat: : 1.03 (01/14/2009)   Chol: 204 (06/24/2009)   HDL: 60 (06/24/2009)   LDL: 111 (06/24/2009)   TG: 166 (06/24/2009)  Complete Medication List: 1)  Glucometer and Supplies  .... Check cbg two times a day and record dx=iddm uncontrolled 2)  Baby Aspirin 81 Mg Chew (Aspirin) .... Take 1 tablet by mouth once a day for stroke prevention 3)  Amlodipine Besylate 10 Mg Tabs (Amlodipine besylate) .Marland Kitchen.. 1 by mouth once daily 4)  Glipizide 10 Mg Tabs (Glipizide) .... 1/2 tab by mouth two times a day 5)  Metformin Hcl 1000 Mg Tabs (Metformin hcl) .Marland Kitchen.. 1 tab by mouth two times a day 6)  Benazepril Hcl 10 Mg Tabs (Benazepril hcl) .Marland Kitchen.. 1 tab by mouth daily 7)  Hydrochlorothiazide 12.5 Mg Tabs (Hydrochlorothiazide) .Marland Kitchen.. 1 tab by mouth in morning. 8)  Plavix 75 Mg Tabs (Clopidogrel bisulfate) .Marland Kitchen.. 1 by mouth once daily 9)  Meclizine Hcl 25 Mg Tabs (Meclizine hcl) .Marland Kitchen.. 1 by mouth q 6hours as needed 10)  One Touch Ultra Test Strips  .... Test three times a day 11)  Lorazepam 1 Mg Tabs (Lorazepam) .... 1/2 to 1 tab by mouth every 8 hours as needed for anxiety 12)  Tub Bench  .Marland Kitchen.. 386.2 and v12.59 13)  Miralax Powd (Polyethylene glycol 3350) .Marland KitchenMarland KitchenMarland Kitchen 17 g in 8 oz fluid by mouth daily--decrease to 1/2 dose if stools become too loose 14)  Flomax 0.4 Mg Caps (Tamsulosin hcl) .... Take 1 tablet by mouth once a day 15)  Bleph-10 10 % Soln (Sulfacetamide sodium) .... One gtt in each eye tid  Hypertension Assessment/Plan:      The  patient's hypertensive risk group is category C: Target organ damage and/or diabetes.  Her calculated 10 year risk of coronary heart disease is 8 %.  Today's blood pressure is 116/68.  Her blood pressure goal is < 130/80.   Patient Instructions: 1)  Clean any discharge from eyelids with baby shampoo and warm water. Be sure to wash your hands often  to avoid spreading and reinfection. If you wear contacts, remove them and wear glasses until infection resolved (be sure and clean lenses before replacing).  Prescriptions: BLEPH-10 10 % SOLN (SULFACETAMIDE SODIUM) one gtt in each eye TID  #1 bottle x 0   Entered and Authorized by:   Janith Lima MD   Signed by:   Janith Lima MD on 01/25/2010   Method used:   Electronically to  [facility] [address] [phone]* (retail)       [address]       [place], [place]  [address]       Ph: [phone] or [phone]       Fax: [phone]   RxID:   [id number]    Orders Added: 1)  Ophthalmology Referral [Ophthalmology] 2)  Est. Patient Level IV [id number]

## 2010-03-10 NOTE — Miscellaneous (Signed)
  Clinical Lists Changes  Problems: Changed problem from PROBLEMS WITH SWALLOWING AND MASTICATION (ICD-V41.6) - Upgraded to Mechanical Soft DIII diet--thin liquids allowed to PROBLEMS WITH SWALLOWING AND MASTICATION (ICD-V41.6) - Upgraded to Mechanical Soft DIII diet--thin liquids allowed.  Last evaluation 10/01/09--needs to perform chin tuck to avoid silent aspiration.  Appended Document:     Clinical Lists Changes  Problems: Changed problem from PROBLEMS WITH SWALLOWING AND MASTICATION (ICD-V41.6) - Upgraded to Mechanical Soft DIII diet--thin liquids allowed.  Last evaluation 10/01/09--needs to perform chin tuck to avoid silent aspiration. to PROBLEMS WITH SWALLOWING AND MASTICATION (ICD-V41.6) - Upgraded to Mechanical Soft DIII diet--thin liquids allowed.  Last evaluation 10/01/09--needs to perform chin tuck to avoid silent aspiration.  To be seated upright at 90 degrees and take small bites, sips as well.

## 2010-03-10 NOTE — Letter (Signed)
Summary: Michelle Lopez /PHYSICIANS ORDERS  [facility] /PHYSICIANS ORDERS   Imported By: Roland Earl 04/05/2009 15:29:20  _____________________________________________________________________  External Attachment:    Type:   Image     Comment:   External Document

## 2010-03-10 NOTE — Letter (Signed)
Summary: [facility]   Imported By: Roland Earl 04/08/2009 12:51:11  _____________________________________________________________________  External Attachment:    Type:   Image     Comment:   External Document

## 2010-03-10 NOTE — Miscellaneous (Signed)
Summary: RENEWAL SUMMARY  RENEWAL SUMMARY   Imported By: Roland Earl 09/20/2009 10:21:34  _____________________________________________________________________  External Attachment:    Type:   Image     Comment:   External Document

## 2010-03-10 NOTE — Letter (Signed)
Summary: REQUEST FOR PERSONAL CARE SERVICES  REQUEST FOR PERSONAL CARE SERVICES   Imported By: Roland Earl 07/13/2009 10:04:29  _____________________________________________________________________  External Attachment:    Type:   Image     Comment:   External Document

## 2010-03-10 NOTE — Letter (Signed)
Summary: NUTRITIONIST//[facility]  NUTRITIONIST//[facility]   Imported By: Roland Earl 01/24/2010 12:04:05  _____________________________________________________________________  External Attachment:    Type:   Image     Comment:   External Document

## 2010-03-10 NOTE — Letter (Signed)
Summary: REFERRAL/PHYSICAL THERAPY/[facility]IGO  REFERRAL/PHYSICAL THERAPY/[facility]IGO   Imported By: Roland Earl 03/05/2009 12:53:02  _____________________________________________________________________  External Attachment:    Type:   Image     Comment:   External Document

## 2010-03-10 NOTE — Letter (Signed)
Summary: REFERRAL/PERSONAL CARE SERVICES//FAXED  REFERRAL/PERSONAL CARE SERVICES//FAXED   Imported By: Roland Earl 09/15/2009 12:52:46  _____________________________________________________________________  External Attachment:    Type:   Image     Comment:   External Document

## 2010-03-10 NOTE — Letter (Signed)
Summary: PERSONAL CARE SERVICE  PERSONAL CARE SERVICE   Imported By: Roland Earl 09/02/2009 15:52:01  _____________________________________________________________________  External Attachment:    Type:   Image     Comment:   External Document

## 2010-03-10 NOTE — Letter (Signed)
Summary: [facility]//FAXED  [facility]//FAXED   Imported By: Roland Earl 03/09/2009 15:10:58  _____________________________________________________________________  External Attachment:    Type:   Image     Comment:   External Document

## 2010-03-10 NOTE — Letter (Signed)
Summary: [facility] REPORT  [facility] REPORT   Imported By: Roland Earl 10/26/2009 10:00:14  _____________________________________________________________________  External Attachment:    Type:   Image     Comment:   External Document

## 2010-03-10 NOTE — Miscellaneous (Signed)
Summary: Rehab Report//INITIAL SUMMARY/MAILED  Rehab Report//INITIAL SUMMARY/MAILED   Imported By: Roland Earl 07/29/2009 10:15:36  _____________________________________________________________________  External Attachment:    Type:   Image     Comment:   External Document

## 2010-03-10 NOTE — Progress Notes (Signed)
Summary: MED ?  Phone Note Call from Patient   Caller: Daughter Michelle Lopez [phone]  (11:30) Summary of Call: Daughter called: Should pt be taking amlodipine & gen flomax?  Initial call taken by: Charlsie Quest, CMA,  January 17, 2010 11:45 AM  Follow-up for Phone Call        amlodipine - yes flomax- probably not, but I was not the Doctor that started her on that so I am not sure Follow-up by: Janith Lima MD,  January 17, 2010 11:56 AM  Additional Follow-up for Phone Call Additional follow up Details #1::        Daughter informed.  Additional Follow-up by: Charlsie Quest, [place],  January 17, 2010 2:11 PM

## 2010-03-10 NOTE — Assessment & Plan Note (Signed)
Summary: DM CHECK//KT   Vital Signs:  Patient profile:   65 year old female Weight:      144 pounds BMI:     27.76 Temp:     97.7 degrees F Pulse rate:   82 / minute Pulse rhythm:   regular Resp:     16 per minute BP sitting:   138 / 72  (left arm) Cuff size:   regular  Vitals Entered By: Shellia Carwin CMA (Jun 24, 2009 12:34 PM) CC: f/u on diabetes Is Patient Diabetic? Yes Pain Assessment Patient in pain? yes     Location: lt hip Intensity: 9 CBG Result 202  Does patient need assistance? Ambulation Impaired:Risk for fall   CC:  f/u on diabetes.  History of Present Illness: 1.  Pt. needs paperwork filled out for home aide:  Needs meals prepared.  Needs help getting into bath or shower.    2.  Central Vertigo:  pt. states she did go to physical therapy for this.  Finished 2 weeks ago.  Continuing exercises as well, though compliance in the past has been as issue.  Discussed will always have this from her stroke.  Sounds like much of her safety features were left at her [facility] house after her death in [facility] recall anything but a tub bench.  3.  Speech:  When eats, feels like she is choking.  Did receive speech therapy and speech/swallowing evaluation, but cannot find swallowing evaluation in emr nor on echart.  Pt not aware of having to thicken  liquids or being on a dysphagia diet.  She is aware of needing a chin tuck, but was not aware she was to do that with swallowing.  Feels she has had problems with this for 3 months.  4.  DM: Sugars checked by daughter, [name], with whom she is currently living.  A1C today is 7.3%--adequate.  5.  Hypertension:  has been controlled.  6.  Loss of daughter, Dewaine Oats, who was one of main caregivers--a little over 2 months ago.  Cannot stop crying, looks forward to day, able to get out of bed and get dressed daily.  Also, frustrated with her physical condition and impairments.   7.  Hyperlipidemia:  never came back for FLP after  increase in Simvastatin.  Fasting today.   8.  Low back pain into lateral upper left thigh.  Has been a problem for about 1 month.  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  Constant pain.  Dull aching pain.  No movement or position makes worse or better.  No numbness, tingling or weakness down that leg.  Allergies (verified): 1)  ! Morphine  Physical Exam  Lungs:  Normal respiratory effort, chest expands symmetrically. Lungs are clear to auscultation, no crackles or wheezes. Heart:  Normal rate and regular rhythm. S1 and S2 normal without gallop, murmur, click, rub or other extra sounds.  Radial pulses normal and equal Msk:  Tender over greater trochanter on left Neurologic:  patellar reflex on left possibly mildly increased--3+, strength of left hip flexor difficult to evaluate secondary to pain   Impression & Recommendations:  Problem # 1:  HIP PAIN, LEFT (ICD-719.45)  Her updated medication list for this problem includes:    Baby Aspirin 81 Mg Chew (Aspirin) .Marland Kitchen... Take 1 tablet by mouth once a day for stroke prevention  Orders: Diagnostic X-Ray/Fluoroscopy (Diagnostic X-Ray/Flu) Physical Therapy Referral (PT)  Problem # 2:  DEPRESSION (ICD-311)  Her updated medication list for this problem includes:  Lorazepam 1 Mg Tabs (Lorazepam) .Marland Kitchen... 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 no longer remembers what to do with swallowing. Will send her back to speech therapy to review diet and swallowing recommendations Did obtain Swallowing study later in day--pt. is to be on a Dysphagia 3 diet, ?Thin liquids--this will need to be reviewed by speech therapy as appears she has silent aspiration with thin liquids, to take meds in puree (apple sauce), clear throat  intermittently with eating and perform chin tuck with swallow.  Orders: Speech Therapy (Speech Therapy)  Problem # 5:  HYPERLIPIDEMIA (P102836.4)  Her updated medication list for this problem includes:    Simvastatin 40 Mg Tabs (Simvastatin) .Marland Kitchen... 1 tab by mouth daily  Orders: T-Lipid Profile [id number])  Problem # 6:  DIABETES MELLITUS, TYPE II, UNCONTROLLED (ICD-250.02)  Control adequate Her updated medication list for this problem includes:    Baby Aspirin 81 Mg Chew (Aspirin) .Marland Kitchen... Take 1 tablet by mouth once a day for stroke prevention    Glipizide 10 Mg Tabs (Glipizide) .Marland Kitchen... 1 by mouth once daily    Metformin Hcl 1000 Mg Tabs (Metformin hcl) .Marland Kitchen... 1 tab by mouth two times a day    Benazepril Hcl 10 Mg Tabs (Benazepril hcl) .Marland Kitchen... 1 tab by mouth daily    Actos 30 Mg Tabs (Pioglitazone hcl) .Marland Kitchen... 1 tab by mouth daily  Orders: Hemoglobin A1C (83036)  Problem # 7:  ESSENTIAL HYPERTENSION, BENIGN (ICD-401.1) Controlled Her updated medication list for this problem includes:    Amlodipine Besylate 10 Mg Tabs (Amlodipine besylate) .Marland Kitchen... 1 by mouth once daily    Benazepril Hcl 10 Mg Tabs (Benazepril hcl) .Marland Kitchen... 1 tab by mouth daily    Hydrochlorothiazide 12.5 Mg Tabs (Hydrochlorothiazide) .Marland Kitchen... 1 tab by mouth in morning.  Complete Medication List: 1)  Glucometer and Supplies  .... Check cbg two times a day and record dx=iddm uncontrolled 2)  Baby Aspirin 81 Mg Chew (Aspirin) .... Take 1 tablet by mouth once a day for stroke prevention 3)  Amlodipine Besylate 10 Mg Tabs (Amlodipine besylate) .Marland Kitchen.. 1 by mouth once daily 4)  Glipizide 10 Mg Tabs (Glipizide) .Marland Kitchen.. 1 by mouth once daily 5)  Metformin Hcl 1000 Mg Tabs (Metformin hcl) .Marland Kitchen.. 1 tab by mouth two times a day 6)  Benazepril Hcl 10 Mg Tabs (Benazepril hcl) .Marland Kitchen.. 1 tab by mouth daily 7)  Hydrochlorothiazide 12.5 Mg Tabs (Hydrochlorothiazide) .Marland Kitchen.. 1 tab by mouth in morning. 8)  Actos 30 Mg Tabs (Pioglitazone hcl) .Marland Kitchen.. 1 tab by  mouth daily 9)  Zolpidem Tartrate 5 Mg Tabs (Zolpidem tartrate) .Marland Kitchen.. 1 by mouth at bedtime 10)  Plavix 75 Mg Tabs (Clopidogrel bisulfate) .Marland Kitchen.. 1 by mouth once daily 11)  Tamsulosin Hcl 0.4 Mg Caps (Tamsulosin hcl) .Marland Kitchen.. 1 by mouth once daily 12)  Meclizine Hcl 25 Mg Tabs (Meclizine hcl) .Marland Kitchen.. 1 by mouth q 6hours as needed 13)  Simvastatin 40 Mg Tabs (Simvastatin) .Marland Kitchen.. 1 tab by mouth daily 14)  One Touch Ultra Test Strips  .... Test three times a day 15)  Lorazepam 1 Mg Tabs (Lorazepam) .... 1/2 to 1 tab by mouth every 8 hours as needed for anxiety 16)  Tub Bench  .Marland Kitchen.. 386.2 and v12.59  Other Orders: Capillary Blood Glucose/CBG [phone])  Patient Instructions: 1)  Take Tylenol 1000 mg two times a day for hip pain 2)  Follow up with Dr. Amil Amen in 3 months --hip pain 3)  Referral to Romilda Joy, grieving Prescriptions: TUB BENCH 386.2 and V12.59  #1 x 0   Entered and Authorized by:   Mack Hook MD   Signed by:   Mack Hook MD on 06/24/2009   Method used:   Print then Give to Patient   RxID:   [id number]

## 2010-03-10 NOTE — Progress Notes (Signed)
Summary: [place]  Phone Note From Other Clinic Call back at [phone]   Summary of Call: Erin from [facility] called in because they finished with the therapy and the pt wants to conitinue with the thepary outside of the home but in order to do that the physician needs to do a prescription for authorization. Haughton MD  Initial call taken by: Alexis Goodell,  February 22, 2009 11:09 AM  Follow-up for Phone Call        forward to provider to review for additional orders... Follow-up by: Vinetta Bergamo CMA,  February 22, 2009 12:09 PM  Additional Follow-up for Phone Call Additional follow up Details #1::        pt's daughter is calling and wants someone to also contact Kathy([facility]) speech therapy for additional services. Yvette Swartzendruber(daughter)please contact if you have additional [phone] or [phone]). Additional Follow-up by: Vinetta Bergamo CMA,  February 22, 2009 2:41 PM    Additional Follow-up for Phone Call Additional follow up Details #2::    I believe I filled out forms for more speech. Please see the order already done for outpatient vertigo PT at [facility] that been faxed? Follow-up by: Mack Hook MD,  February 23, 2009 6:38 PM  Additional Follow-up for Phone Call Additional follow up Details #3:: Details for Additional Follow-up Action Taken: Referral faxed last week. Additional Follow-up by: Shellia Carwin CMA,  March 01, 2009 2:56 PM

## 2010-03-10 NOTE — Medication Information (Signed)
Summary: [facility]   Imported By: Roland Earl 12/10/2009 15:22:11  _____________________________________________________________________  External Attachment:    Type:   Image     Comment:   External Document

## 2010-03-10 NOTE — Letter (Signed)
Summary: OSYCHOLOGY REFERRAL//NO SHOWED  OSYCHOLOGY REFERRAL//NO SHOWED   Imported By: Roland Earl 02/14/2010 10:33:57  _____________________________________________________________________  External Attachment:    Type:   Image     Comment:   External Document

## 2010-03-10 NOTE — Miscellaneous (Signed)
Summary: Rehab Report//DISCHARGE SUMMARY  Rehab Report//DISCHARGE SUMMARY   Imported By: Roland Earl 11/18/2009 15:57:26  _____________________________________________________________________  External Attachment:    Type:   Image     Comment:   External Document

## 2010-03-10 NOTE — Letter (Signed)
Summary: REFERRAL//O.P MODIFIED BARIUM SWALLOW  REFERRAL//O.P MODIFIED BARIUM SWALLOW   Imported By: Roland Earl 03/22/2009 14:13:56  _____________________________________________________________________  External Attachment:    Type:   Image     Comment:   External Document

## 2010-03-10 NOTE — Letter (Signed)
Summary: acute rehab  acute rehab   Imported By: Roland Earl 09/21/2009 14:29:40  _____________________________________________________________________  External Attachment:    Type:   Image     Comment:   External Document

## 2010-03-10 NOTE — Miscellaneous (Signed)
Summary: [facility]   Imported By: Roland Earl 09/30/2009 10:34:27  _____________________________________________________________________  External Attachment:    Type:   Image     Comment:   External Document

## 2010-03-10 NOTE — Letter (Signed)
Summary: Generic Letter  [facility]  [address] [place], [place] [address]   Phone: [phone]  Fax: [phone]    08/10/2009  Re:  SONIQUE HARLAN      [id number] [place], [place]  [address]  To Whom It May Concern:  Due to multiple health concerns, including a history of stroke with residual chronic dizziness/vertigo and lack of balance, Ms.  Minich would not be safe living alone.   Sincerely,   Mack Hook MD

## 2010-03-10 NOTE — Progress Notes (Signed)
Summary: NOT RESTING/DAUGHTER HAD MASSIVE STROKE  Phone Note Other Incoming Call back at [phone]   Caller: DAUGHTER-Michelle Lopez Summary of Call: [place] TO SEE IF YOU CAN PRESCRIBE SOMETHING TO HELP Michelle Lopez REST AND THE REASON WHY IS BECAUSE HER DAUGHTER Michelle Lopez WHO WAS HELPING TAKE CARE OF Michelle Lopez, HAS HAD A MASSIVE STROKE AND IS BRAIN DEAD. Michelle Lopez SAYS THAT THIS HAPPENED "SUNDAY AND Michelle Seaberg REFUSES TO LEAVE THE HOSPITAL, SHE WILL NOT EAT AND SHE HASN'T HAD ANY REST SINCE THIS HAS HAPPENED. Initial call taken by: Kimberly Tinnin,  March 25, 2009 10:43 AM  Follow-up for Phone Call        Will fax Ativan--see where she wants it.  Additional Follow-up for Phone Call Additional follow up Details #1::        [facility]  Faxed. Additional Follow-up by: Tiffany McCoy CMA,  March 25, 2009 11:23 AM    New/Updated Medications: LORAZEPAM 1 MG TABS (LORAZEPAM) 1/2 to 1 tab by mouth every 8 hours as needed for anxiety Prescriptions: LORAZEPAM 1 MG TABS (LORAZEPAM) 1/2 to 1 tab by mouth every 8 hours as needed for anxiety  #10 x 0   Entered and Authorized by:   Natanael Saladin MD   Signed by:   Joda Braatz MD on 03/25/2009   Method used:   Printed then faxed to ...       [facility]  [address] [id number] (retail)       [address]       [place], [place]  [address]       Ph: [phone] or [phone]       Fax: [phone]   RxID:   [id number]

## 2010-03-10 NOTE — Assessment & Plan Note (Signed)
Summary: DIZZINESS/VERTIGO///KT   Vital Signs:  Patient profile:   65 year old female Weight:      146.8 pounds Temp:     98.9 degrees F Pulse rate:   88 / minute Pulse rhythm:   regular Resp:     20 per minute BP sitting:   140 / 72  (left arm) Cuff size:   regular  Vitals Entered By: Rhunette Croft (November 16, 2009 12:02 PM) CC: follow-up visit. pt c/o dizziness. pt states it is getting worse  Is Patient Diabetic? Yes Pain Assessment Patient in pain? no       Does patient need assistance? Ambulation Impaired:Risk for fall Comments pt ambulates with cane   Chief Complaint:  follow-up visit. pt c/o dizziness. pt states it is getting worse .  History of Present Illness: 1.  Vertigo:  Chronic central vertigo.  Daughter states that Ms. Farooqui argued with caregiver and caregiver will no longer come.  Daughter quite adamant that her mother just is not doing what she should--doesn't exercise as recommended, doesn't eat in healthy way.  Not doing any home exercise program from [facility].  Pt. states when daughter leaves room that no one is willing to go out with her for walking, etc.  She is not using her cane or walker in home as she feels she needs to practice walking without help.  Grandson living with pt.  Live in caregiver to start soon.--no one available in evening to make sure she takes her Metformin with meal.  2. DM:  Not clear, but sugars in 200s per daughter.  Ms. Zicari states that was only once.  Not eating meals on a regular basis.  Not taking Metformin with meals as she often skips the meal.  Missing evening dose most days as just forgets. Willing to see Mendel Corning again for instruction in diet.  3.  Hyperlipidemia:  On Simvastatin--discussed need to switch to Pravastatin.   4.  Dysphagia:  still feels like she is choking at times, but not clear she is always doing a chin tuck--mainly with thin liquids.  Breathing is fine.  5.  Hypertension:  Sounds like  taking meds most of the time except for evening Metformin.  Little to no exercise as above  6.  Constipation:  Stools always hard and small.  Tries to drink enough fluid (sounds like she is drinking soda frequently per daughter)  Not clear she is getting much in way of veggies/fruits.  Has never tried Miralax.  Current Medications (verified): 1)  Glucometer and Supplies .... 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 Take 1 Tablet By Mouth Once A Day For Stroke Prevention 3)  Amlodipine Besylate 10 Mg Tabs (Amlodipine Besylate) .Marland Kitchen.. 1 By Mouth Once Daily 4)  Glipizide 10 Mg Tabs (Glipizide) .Marland Kitchen.. 1 By Mouth Once Daily 5)  Metformin Hcl 1000 Mg Tabs (Metformin Hcl) .Marland Kitchen.. 1 Tab By Mouth Two Times A Day 6)  Benazepril Hcl 10 Mg Tabs (Benazepril Hcl) .Marland Kitchen.. 1 Tab By Mouth Daily 7)  Hydrochlorothiazide 12.5 Mg Tabs (Hydrochlorothiazide) .Marland Kitchen.. 1 Tab By Mouth in Morning. 8)  Actos 30 Mg Tabs (Pioglitazone Hcl) .Marland Kitchen.. 1 Tab By Mouth Daily 9)  Zolpidem Tartrate 5 Mg Tabs (Zolpidem Tartrate) .Marland Kitchen.. 1 By Mouth At Bedtime 10)  Plavix 75 Mg Tabs (Clopidogrel Bisulfate) .Marland Kitchen.. 1 By Mouth Once Daily 11)  Tamsulosin Hcl 0.4 Mg Caps (Tamsulosin Hcl) .Marland Kitchen.. 1 By  Mouth Once Daily 12)  Meclizine Hcl 25 Mg Tabs (Meclizine Hcl) .Marland Kitchen.. 1 By Mouth Q 6hours As Needed 13)  Simvastatin 80 Mg Tabs (Simvastatin) .Marland Kitchen.. 1 Tab By Mouth Daily 14)  One Touch Ultra Test Strips .... Test Three Times A Day 15)  Lorazepam 1 Mg Tabs (Lorazepam) .... 1/2 To 1 Tab By Mouth Every 8 Hours As Needed For Anxiety 16)  Tub Bench .Marland Kitchen.. 386.2 and V12.59  Allergies (verified): 1)  ! Morphine  Physical Exam  General:  NAD, frequently tearful. Lungs:  Normal respiratory effort, chest expands symmetrically. Lungs are clear to auscultation, no crackles or wheezes. Heart:  Normal rate and regular rhythm. S1 and S2 normal without gallop, murmur, click, rub or other extra sounds. Neurologic:  Left  sided weakness with fairly stable gait using cane.  Does have obvious symptoms of vertigo when turns around.   Impression & Recommendations:  Problem # 1:  VERTIGO, CENTRAL (ICD-386.2) Called [facility]--pt. was noncompliant and plateaued because of this--they will send a copy of the exercises that were recommended.  If she needs retraining, they could do 1-2 visits if necessary. Orders: Misc. Referral (Misc. Ref)  Problem # 2:  DEPRESSION (ICD-311) Will have her come in for counseling with Birdie Hopes May need medication. Not sure if pt. is truly being bullied at home--she has a history of noncompliance, but suspect she is feeling a loss of independence secondary to her condition and how she is being treated at home. Her updated medication list for this problem includes:    Lorazepam 1 Mg Tabs (Lorazepam) .Marland Kitchen... 1/2 to 1 tab by mouth every 8 hours as needed for anxiety  Orders: Psychology Referral (Psychology)  Her updated medication list for this problem includes:    Lorazepam 1 Mg Tabs (Lorazepam) .Marland Kitchen... 1/2 to 1 tab by mouth every 8 hours as needed for anxiety  Problem # 3:  PROBLEMS WITH SWALLOWING AND MASTICATION (ICD-V41.6) To continue with chin tuck.  Problem # 4:  HYPERLIPIDEMIA (P102836.4)  Switching to Pravastatin Will recheck cholesterol at next visit. Her updated medication list for this problem includes:    Pravastatin Sodium 80 Mg Tabs (Pravastatin sodium) .Marland Kitchen... 1 tab by mouth daily  Her updated medication list for this problem includes:    Pravastatin Sodium 80 Mg Tabs (Pravastatin sodium) .Marland Kitchen... 1 tab by mouth daily  Problem # 5:  DIABETES MELLITUS, TYPE II, UNCONTROLLED (ICD-250.02) To come in and work with [place] on dietary habits Because of med concerns and concerns she is getting adequate attention with exercises, will refer to [facility] and see if they can evaluate home situation and if can set up meds .  Her updated medication list for this problem  includes:    Baby Aspirin 81 Mg Chew (Aspirin) .Marland Kitchen... Take 1 tablet by mouth once a day for stroke prevention    Glipizide 10 Mg Tabs (Glipizide) .Marland Kitchen... 1 by mouth once daily    Metformin Hcl 1000 Mg Tabs (Metformin hcl) .Marland Kitchen... 1 tab by mouth two times a day    Benazepril Hcl 10 Mg Tabs (Benazepril hcl) .Marland Kitchen... 1 tab by mouth daily    Actos 30 Mg Tabs (Pioglitazone hcl) .Marland Kitchen... 1 tab by mouth daily  Problem # 6:  ESSENTIAL HYPERTENSION, BENIGN (ICD-401.1) Fair control Her updated medication list for this problem includes:    Amlodipine Besylate 10 Mg Tabs (Amlodipine besylate) .Marland Kitchen... 1 by mouth once daily    Benazepril Hcl 10 Mg Tabs (Benazepril hcl) .Marland KitchenMarland KitchenMarland KitchenMarland Kitchen 1  tab by mouth daily    Hydrochlorothiazide 12.5 Mg Tabs (Hydrochlorothiazide) .Marland Kitchen... 1 tab by mouth in morning.  Problem # 7:  CONSTIPATION (ICD-564.00) Start Miralax. Her updated medication list for this problem includes:    Miralax Powd (Polyethylene glycol 3350) .Marland KitchenMarland KitchenMarland KitchenMarland Kitchen 17 g in 8 oz fluid by mouth daily--decrease to 1/2 dose if stools become too loose  Problem # 8:  Preventive Health Care (ICD-V70.0) Flu and Tdap vaccines today.  Complete Medication List: 1)  Glucometer and Supplies  .... Check cbg two times a day and record dx=iddm uncontrolled 2)  Baby Aspirin 81 Mg Chew (Aspirin) .... Take 1 tablet by mouth once a day for stroke prevention 3)  Amlodipine Besylate 10 Mg Tabs (Amlodipine besylate) .Marland Kitchen.. 1 by mouth once daily 4)  Glipizide 10 Mg Tabs (Glipizide) .Marland Kitchen.. 1 by mouth once daily 5)  Metformin Hcl 1000 Mg Tabs (Metformin hcl) .Marland Kitchen.. 1 tab by mouth two times a day 6)  Benazepril Hcl 10 Mg Tabs (Benazepril hcl) .Marland Kitchen.. 1 tab by mouth daily 7)  Hydrochlorothiazide 12.5 Mg Tabs (Hydrochlorothiazide) .Marland Kitchen.. 1 tab by mouth in morning. 8)  Actos 30 Mg Tabs (Pioglitazone hcl) .Marland Kitchen.. 1 tab by mouth daily 9)  Zolpidem Tartrate 5 Mg Tabs (Zolpidem tartrate) .Marland Kitchen.. 1 by mouth at bedtime 10)  Plavix 75 Mg Tabs (Clopidogrel bisulfate) .Marland Kitchen.. 1 by mouth once  daily 11)  Tamsulosin Hcl 0.4 Mg Caps (Tamsulosin hcl) .Marland Kitchen.. 1 by mouth once daily 12)  Meclizine Hcl 25 Mg Tabs (Meclizine hcl) .Marland Kitchen.. 1 by mouth q 6hours as needed 13)  Pravastatin Sodium 80 Mg Tabs (Pravastatin sodium) .Marland Kitchen.. 1 tab by mouth daily 14)  One Touch Ultra Test Strips  .... Test three times a day 15)  Lorazepam 1 Mg Tabs (Lorazepam) .... 1/2 to 1 tab by mouth every 8 hours as needed for anxiety 16)  Tub Bench  .Marland Kitchen.. 386.2 and v12.59 17)  Miralax Powd (Polyethylene glycol 3350) .Marland KitchenMarland KitchenMarland Kitchen 17 g in 8 oz fluid by mouth daily--decrease to 1/2 dose if stools become too loose  Patient Instructions: 1)  Follow up with Dr. Amil Amen in 3 months --constipation, depression,  2)  Referral to [place] 3)  Referral to Birdie Hopes Prescriptions: MIRALAX  POWD (POLYETHYLENE GLYCOL 3350) 17 g in 8 oz fluid by mouth daily--decrease to 1/2 dose if stools become too loose  #1 month x 11   Entered and Authorized by:   Mack Hook MD   Signed by:   Mack Hook MD on 11/16/2009   Method used:   [facility] [address] [phone]* (retail)       [address]       [place], [place]  [address]       Ph: [phone] or [phone]       Fax: [phone]   RxID:   [id number] PRAVASTATIN SODIUM 80 MG TABS (PRAVASTATIN SODIUM) 1 tab by mouth daily  #30 x 11   Entered and Authorized by:   Mack Hook MD   Signed by:   Mack Hook MD on 11/16/2009   Method used:   [facility]        [name] [address] [phone]* (retail)       [address]       [place], [place]  [address]       Ph: [phone] or  [phone]       Fax: [phone]   RxIDPD:[id number]     Appended Document: DIZZINESS/VERTIGO///KT    Clinical Lists Changes  Orders: Added new Service order of Influenza Vaccine MCR [phone]) - Signed Added new Service order of Tdap => 29yrs IM [id number]) - Signed Added new Service order of  Admin 1st Vaccine [phone]) - Signed Added new Service order of Admin 1st Vaccine [facility]) [phone]) - Signed Observations: Added new observation of TD BOOST VIS: 12/25/07 version given November 16, 2009. (11/16/2009 14:24) Added new observation of TD BOOSTERLO: [id number] (11/16/2009 14:24) Added new observation of TD BOOST EXP: 02/19/2012 (11/16/2009 14:24) Added new observation of TD BOOSTERBY: Chantel Miller (11/16/2009 14:24) Added new observation of TD BOOSTERRT: IM (11/16/2009 14:24) Added new observation of TDBOOSTERDSE: 0.5 ml (11/16/2009 14:24) Added new observation of TD BOOSTERMF: [place] (11/16/2009 14:24) Added new observation of TD BOOST SIT: right deltoid (11/16/2009 14:24) Added new observation of TD BOOSTER: Tdap (11/16/2009 14:24) Added new observation of FLU VAX#1VIS: 08/31/09 version given November 16, 2009. (11/16/2009 14:24) Added new observation of FLU VAXLOT: [id number] (11/16/2009 14:24) Added new observation of FLU VAX EXP: 08/06/2010 (11/16/2009 14:24) Added new observation of FLU VAXBY: Chantel Miller (11/16/2009 14:24) Added new observation of FLU VAXRTE: IM (11/16/2009 14:24) Added new observation of FLU VAX DSE: 0.5 ml (11/16/2009 14:24) Added new observation of FLU VAXMFR: GlaxoSmithKline (11/16/2009 14:24) Added new observation of FLU VAX SITE: left deltoid (11/16/2009 14:24) Added new observation of FLU VAX: Fluvax MCR (11/16/2009 14:24)       Tetanus/Td Vaccine    Vaccine Type: Tdap    Site: right deltoid    Mfr: [place]    Dose: 0.5 ml    Route: IM    Given by: Rhunette Croft    Exp. Date: 02/19/2012    Lot #: [id number]    VIS given: 12/25/07 version given November 16, 2009.  Influenza Vaccine    Vaccine Type: Fluvax MCR    Site: left deltoid    Mfr: GlaxoSmithKline    Dose: 0.5 ml    Route: IM    Given by: Rhunette Croft    Exp. Date: 08/06/2010    Lot #: [id number]    VIS given: 08/31/09 version given November 16, 2009.  Flu  Vaccine Consent Questions    Do you have a history of severe allergic reactions to this vaccine? no    Any prior history of allergic reactions to egg and/or gelatin? no    Do you have a sensitivity to the preservative Thimersol? no    Do you have a past history of Guillan-Barre Syndrome? no    Do you currently have an acute febrile illness? no    Have you ever had a severe reaction to latex? no    Vaccine information given and explained to patient? yes    Are you currently pregnant? no

## 2010-03-10 NOTE — Miscellaneous (Signed)
  Clinical Lists Changes  Problems: Changed problem from PROBLEMS WITH SWALLOWING AND MASTICATION (ICD-V41.6) to PROBLEMS WITH SWALLOWING AND MASTICATION (ICD-V41.6) - Upgraded to Mechanical Soft DIII diet--thin liquids allowed

## 2010-03-10 NOTE — Progress Notes (Signed)
Summary: Needs alternative cholesterol med  Phone Note Outgoing Call   Summary of Call: Pt. states she's allergic to pravastatin, broke out in hives, face swelled and broke out in hives, face was draining.  Went to the hospital ED on 11/25/09.  Hospital put her on steroids.  Has not had cholesterol medication x1 week. Initial call taken by: Sherian Maroon RN,  December 02, 2009 5:15 PM  Follow-up for Phone Call        Have her throw out her Pravastatin. When her rash is completely resolved, she may start  gemfibrozil 600 mg two times a day. Please schedule her for a repeat FLP/liver profile in 8 weeks. Follow-up by: Mack Hook MD,  December 03, 2009 8:20 AM  Additional Follow-up for Phone Call Additional follow up Details #1::        Left message on answering machine for pt to call back.Marland KitchenMarland KitchenMarland KitchenThailand Shannon  December 07, 2009 2:01 PM   tried calling pt but no answer .Marland KitchenMarland KitchenThailand Shannon  December 08, 2009 9:48 AM  mail box is full... will mail letter.Marland KitchenMarland KitchenThailand Shannon  December 09, 2009 11:14 AM    New Allergies: PRAVASTATIN SODIUM (PRAVASTATIN SODIUM) New/Updated Medications: GEMFIBROZIL 600 MG TABS (GEMFIBROZIL) 1 tab by mouth two times a day with meals New Allergies: PRAVASTATIN SODIUM (PRAVASTATIN SODIUM)Prescriptions: GEMFIBROZIL 600 MG TABS (GEMFIBROZIL) 1 tab by mouth two times a day with meals  #60 x 4   Entered and Authorized by:   Mack Hook MD   Signed by:   Mack Hook MD on 12/03/2009   Method used:   [facility]        [facility] [address] [phone]* (retail)       [address]       [place], [place]  [address]       Ph: [phone] or [phone]       Fax: [phone]   RxID:   [id number]

## 2010-03-10 NOTE — Letter (Signed)
Summary: REFERRAL//REHAB  REFERRAL//REHAB   Imported By: Roland Earl 04/22/2009 12:46:27  _____________________________________________________________________  External Attachment:    Type:   Image     Comment:   External Document

## 2010-03-10 NOTE — Letter (Signed)
Summary: [facility]//INTERIM ORDER/MAILED  [facility]//INTERIM ORDER/MAILED   Imported By: Roland Earl 02/23/2009 11:21:33  _____________________________________________________________________  External Attachment:    Type:   Image     Comment:   External Document

## 2010-03-10 NOTE — Progress Notes (Signed)
Summary: VERBAL ORDER FOR [facility]  Phone Note From Other Clinic Call back at [phone] EXT [phone]   Caller: [facility]  Reason for Call: Need Referral Information Summary of Call: WANTS TO KNOW IF SHE CAN GET A VERBAL ORDER TO GET MS Steinke A SHOWER CHAIR, BECAUSE OF HER HISTORY OF STROKES. Initial call taken by: Roberto Scales,  December 29, 2009 10:37 AM  Follow-up for Phone Call        per [facility] said its okay for pt to get shower chair... called [facility] and gave verbal order.. Follow-up by: [place] Shannon,  December 29, 2009 10:47 AM

## 2010-03-10 NOTE — Letter (Signed)
Summary: IME FOR APPT  IME FOR APPT   Imported By: Roland Earl 03/26/2009 12:02:32  _____________________________________________________________________  External Attachment:    Type:   Image     Comment:   External Document

## 2010-03-10 NOTE — Progress Notes (Signed)
  Phone Note Other Incoming   Summary of Call: Received note from [place] that pt. already discharged before our referral sent (for Speech therapy)   Please call the pt's daughter and see if outpatient PT/Vertigo PT set up and if they are already scheduled. Initial call taken by: Mack Hook MD,  March 08, 2009 11:39 AM  Follow-up for Phone Call        Spoke with pt's daughter Michelle Lopez and she said her mom has her own place now and does not think anyone has come out for the new therapy.  Referral resent to make sure. Follow-up by: Shellia Carwin CMA,  March 11, 2009 12:36 PM

## 2010-03-10 NOTE — Letter (Signed)
Summary: REFERRAL//PHYSICAL THERAPY  REFERRAL//PHYSICAL THERAPY   Imported By: Roland Earl 06/16/2009 12:36:02  _____________________________________________________________________  External Attachment:    Type:   Image     Comment:   External Document

## 2010-03-10 NOTE — Miscellaneous (Signed)
Summary: Rehab Report//DISCHARGE SUMMARY  Rehab Report//DISCHARGE SUMMARY   Imported By: Roland Earl 04/19/2009 14:26:01  _____________________________________________________________________  External Attachment:    Type:   Image     Comment:   External Document

## 2010-03-10 NOTE — Progress Notes (Signed)
Summary: eye burning  Phone Note Call from Patient Call back at Home Phone [phone]   Caller: Daughter Prentiss Bells  Summary of Call: Pt's eyes are swelling again. C/o redness, crust, runny & burning.  Initial call taken by: Charlsie Quest, [place],  January 24, 2010 4:37 PM  Follow-up for Phone Call        Spoke w/dtr, scheduled for eval tomorrow am.  Follow-up by: Charlsie Quest, CMA,  January 24, 2010 5:18 PM

## 2010-03-10 NOTE — Letter (Signed)
Summary: patient progress report  patient progress report   Imported By: Roland Earl 04/06/2009 09:24:48  _____________________________________________________________________  External Attachment:    Type:   Image     Comment:   External Document

## 2010-03-10 NOTE — Miscellaneous (Signed)
Summary: Rehab Report//DISCHARGE SUMMARY  Rehab Report//DISCHARGE SUMMARY   Imported By: Roland Earl 07/22/2009 15:32:47  _____________________________________________________________________  External Attachment:    Type:   Image     Comment:   External Document

## 2010-03-10 NOTE — Progress Notes (Signed)
Summary: [place]   Phone Note Outgoing Call   Summary of Call: I TALK TO AMARY [facility] ABOUT MS S[place] SHE SAID THAT SHE CAN'T HELP HER BECAUSE HER MEDICAID IS NOT [place] [employer]  IS STRAIGHT MEDICAID AND SHE CAN'T TOUCH HER . I SEND THE REFERRAL TO [facility] ph # [phone] or cell [phone] Initial call taken by: Maren Reamer,  November 25, 2009 8:59 AM  Follow-up for Phone Call         I TALK TO [place] SHE IS GOING TO CONTACT A NURSE AND WILL LET Nora . Follow-up by: Maren Reamer,  November 25, 2009 1:02 PM  Additional Follow-up for Phone Call Additional follow up Details #1::        Does [facility] do similar things that [facility] does? Can you also call [place] and see if pt. is a candidate for their home delivery program? Additional Follow-up by: Mack Hook MD,  November 25, 2009 11:35 PM    Additional Follow-up for Phone Call Additional follow up Details #2::    [place] . I CALL JEANNE FROM [facility] SHE WILL PASS BY AND PICK UP THE INFORMATION SO SHE CAN CONTACT THE PT. JEANNE CAME TODAY. I CALL HER DAUGHTER AND I LEFT A MESSAGE TO CALL ME BACK  Follow-up by: Maren Reamer,  November 26, 2009 4:02 PM  Additional Follow-up for Phone Call Additional follow up Details #3:: Details for Additional Follow-up Action Taken: Please have [facility] call here next week when I am back--I would like to speak to them regarding some of my concerns with Ms. Moch.  Mack Hook MD  November 30, 2009 11:29 PM   I'm sorry but Remi Haggard can't help the pt at these time . So i call [facility]  they provide physical therapy and nursing at home I spoke to Gerda Diss [phone] and she told me to faxed the information with the notes .Marland KitchenMaren Reamer  December 10, 2009 11:53 AM  I don't think she will meet criteria for home health--please have  the family get her into [facility] so that [facility] can help out.  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

## 2010-03-10 NOTE — Letter (Signed)
Summary: REFERRAL//PHYSICAL THERAPY  REFERRAL//PHYSICAL THERAPY   Imported By: Roland Earl 05/03/2009 15:24:24  _____________________________________________________________________  External Attachment:    Type:   Image     Comment:   External Document

## 2010-03-10 NOTE — Letter (Signed)
Summary: TEST ORDER FORM//MOD BARIUM SWALLOW  TEST ORDER FORM//MOD BARIUM SWALLOW   Imported By: Roland Earl 02/26/2009 12:03:58  _____________________________________________________________________  External Attachment:    Type:   Image     Comment:   External Document

## 2010-03-10 NOTE — Letter (Signed)
Summary: REFERRAL FORM//PHYSICAL THERAPY  REFERRAL FORM//PHYSICAL THERAPY   Imported By: Roland Earl 04/08/2009 14:10:45  _____________________________________________________________________  External Attachment:    Type:   Image     Comment:   External Document

## 2010-03-10 NOTE — Progress Notes (Signed)
  Phone Note Outgoing Call   Summary of Call: Did pt. ever get set up with [facility] (PT) ?  no notation in emr order.  If not, please refax order and notify pt./daughter Initial call taken by: Mack Hook MD,  April 06, 2009 10:00 AM  Follow-up for Phone Call        Spoke with [facility], they have not heard from them so I will refax order........ Follow-up by: Shellia Carwin CMA,  April 14, 2009 4:20 PM

## 2010-03-10 NOTE — Progress Notes (Signed)
Summary: [facility] referral  Phone Note Outgoing Call   Summary of Call: [place]--referral to [facility] for help with medication management --? [place]?  and also to get an idea of family dynamics.  Pt. feels somewhat bullied by daughter, but pt. also known to be noncompliant and daughter may just be very frustrated.  Pt. is reportedly eating a poor diet, missing evening meds regularly and not performing exercises given to her at [facility] (so her dizziness is worse) Initial call taken by: Mack Hook MD,  November 16, 2009 1:44 PM  Follow-up for Phone Call        I talk to [facility] she is going to give it to [facility] cause she is in charge of Medicaid pt. Follow-up by: Maren Reamer,  November 19, 2009 12:04 PM

## 2010-03-10 NOTE — Miscellaneous (Signed)
Summary: Rehab Report///INITIAL SUMMARY//MAILED  Rehab Report///INITIAL SUMMARY//MAILED   Imported By: Roland Earl 04/30/2009 09:50:41  _____________________________________________________________________  External Attachment:    Type:   Image     Comment:   External Document

## 2010-03-10 NOTE — Progress Notes (Signed)
  Phone Note Other Incoming   Summary of Call: Received another form for personal care services--have already done twice--please call the pt. and Tamika Lawson--see paperwork--and see why we are doing this again. Initial call taken by: Mack Hook MD,  July 28, 2009 9:48 PM  Follow-up for Phone Call        Left msg at [facility] for [place] to call me back. Follow-up by: Shellia Carwin CMA,  July 30, 2009 12:14 PM  Additional Follow-up for Phone Call Additional follow up Details #1::        Form was missing medicaid id and npi number.  Filled in and faxed to [phone] Additional Follow-up by: Shellia Carwin CMA,  July 30, 2009 12:35 PM

## 2010-03-10 NOTE — Letter (Signed)
Summary: O.P. MODIFIED BARIUM SWALLOW REFERRAL  O.P. MODIFIED BARIUM SWALLOW REFERRAL   Imported By: Roland Earl 07/27/2009 10:21:35  _____________________________________________________________________  External Attachment:    Type:   Image     Comment:   External Document

## 2010-03-10 NOTE — Progress Notes (Signed)
Summary: Michelle Lopez  Phone Note From Other Clinic   Summary of Call: Tim from [place] dropped off a patient progress report tor the provider to sign in.  The pt had been discharge from outpatient.  In case there is any questions, you can reach back to Tim at [phone]. [name] Md  Initial call taken by: Alexis Goodell,  April 02, 2009 3:19 PM  Follow-up for Phone Call        PUT IN YOUR REFILL SLOT Follow-up by: Roland Earl,  April 02, 2009 3:21 PM  Additional Follow-up for Phone Call Additional follow up Details #1::        signed Additional Follow-up by: Mack Hook MD,  April 06, 2009 9:59 AM

## 2010-03-10 NOTE — Letter (Signed)
Summary: *HSN Results Follow up  [facility]  [address] [place], [place] [address]   Phone: [phone]  Fax: [phone]      12/09/2009   [facility] [address] [place], [place]  [address]   Dear  Ms. Amani Suchy,                            ____S.Drinkard,FNP   ____D. Gore,FNP       ____B. McPherson,MD   ____V. Rankins,MD    ____E. Mulberry,MD    ____N. Hassell Done, FNP  ____D. Jobe Igo, MD    ____K. Tomma Lightning, MD    ____Other     This letter is to inform you that your recent test(s):  _______Pap Smear    _______Lab Test     _______X-ray    _______ is within acceptable limits  ___X____ requires a medication change  _______ requires a follow-up lab visit  _______ requires a follow-up visit with your Aniket Paye   Comments: We have been trying to reach you.  Please give the office a call       _________________________________________________________ If you have any questions, please contact our office                     Sincerely,  [place] Shannon [facility]

## 2010-03-10 NOTE — Letter (Signed)
Summary: REEFERRAL//PHYSICAL THERAPY  REEFERRAL//PHYSICAL THERAPY   Imported By: Roland Earl 07/07/2009 10:28:43  _____________________________________________________________________  External Attachment:    Type:   Image     Comment:   External Document

## 2010-03-10 NOTE — Progress Notes (Signed)
Summary: Letter Request  Phone Note Call from Patient Call back at [phone]   Summary of Call: Gennaro Africa, who is the daughter of the pt, is requesting the provider write down a letter stating ther mother cannot live alone.  The appointment with the section A which is on July 14.  Please when the letter is ready call her back so she can pick up. Kalub Morillo MD Initial call taken by: Alexis Goodell,  August 05, 2009 2:55 PM  Follow-up for Phone Call        Fwd to Dr. Amil Amen for letter. Follow-up by: Shellia Carwin CMA,  August 05, 2009 3:16 PM  Additional Follow-up for Phone Call Additional follow up Details #1::        Done Additional Follow-up by: Mack Hook MD,  August 10, 2009 5:58 PM    Additional Follow-up for Phone Call Additional follow up Details #2::    left msg at [phone] for [name] to call back. Follow-up by: Shellia Carwin CMA,  August 12, 2009 9:22 AM  Additional Follow-up for Phone Call Additional follow up Details #3:: Details for Additional Follow-up Action Taken: Pt's daughter aware. [name] Additional Follow-up by: Shellia Carwin CMA,  August 18, 2009 4:06 PM

## 2010-03-10 NOTE — Miscellaneous (Signed)
Summary: Care Plans/[facility]  Care Plans/[facility]   Imported By: Phillis Knack 01/17/2010 09:02:13  _____________________________________________________________________  External Attachment:    Type:   Image     Comment:   External Document

## 2010-03-10 NOTE — Letter (Signed)
Summary: MBS REPORT  MBS REPORT   Imported By: Roland Earl 10/14/2009 12:33:57  _____________________________________________________________________  External Attachment:    Type:   Image     Comment:   External Document

## 2010-03-10 NOTE — Letter (Signed)
Summary: [facility]  [facility]   Imported By: Roland Earl 10/12/2009 14:39:08  _____________________________________________________________________  External Attachment:    Type:   Image     Comment:   External Document

## 2010-03-10 NOTE — Assessment & Plan Note (Signed)
Summary: dizziness//gk   Vital Signs:  Patient profile:   65 year old female Weight:      142.6 pounds Temp:     98.3 degrees F oral Pulse rate:   88 / minute Pulse rhythm:   regular Resp:     17 per minute BP sitting:   118 / 74  (left arm) Cuff size:   regular  Vitals Entered By: Sharon Seller (February 09, 2009 2:37 PM) CC: pt states she has been dizzy all throughout the day shes been  feeling the dizziness since Nov 15th, pt is out of the One Touch Ultra strips for her glucose machine, pt is also out of Zolpidem Tartrate 5mg , pt has also beenhaving lower back pain mostly when shes moving around  Is Patient Diabetic? Yes Did you bring your meter with you today? Yes Pain Assessment Patient in pain? no      CBG Result 139  Does patient need assistance? Functional Status Self care Ambulation Impaired:Risk for fall   CC:  pt states she has been dizzy all throughout the day shes been  feeling the dizziness since Nov 15th, pt is out of the One Touch Ultra strips for her glucose machine, pt is also out of Zolpidem Tartrate 5mg , and pt has also beenhaving lower back pain mostly when shes moving around .  History of Present Illness: Pt. here with daughter, Evette  1.  Vertigo:  states has been worse for past week.  Has gradually worsened from her point of view.  Is undergoing PT with [facility]--Erin and Annette--sounds like they are doing vertigo rehab:  [phone]  No falls since last here.  2.  Swallowing concerns:  apparently having some aspiration--doing a chin tuck.  Have not seen report.   3.  DM:  Did get meds filled.  Have not been testing as out of strips.   4.  Health maintnenance:  did get a flu shot last visit per pt.   5.  Hyperlipidemia:  Did not increase Simvastatin as asked after last labs.  Allergies (verified): 1)  ! Morphine  Physical Exam  Lungs:  Normal respiratory effort, chest expands symmetrically. Lungs are clear to auscultation, no crackles or  wheezes. Heart:  Normal rate and regular rhythm. S1 and S2 normal without gallop, murmur, click, rub or other extra sounds.  Radial pulses normal and equal Neurologic:  Positive Romberg with eyes closed in particular.Rapid alternating motions with some difficulty on left hand--appears to be weakness.   Gait appears about the same.  Unsteady but does fairly well with cane   Impression & Recommendations:  Problem # 1:  VERTIGO, CENTRAL (ICD-386.2) Call into Annette at [facility] like to get her input as to how pt. is doing.  Problem # 2:  HYPERLIPIDEMIA (B2193296.4) Increase Simvastatin to 40 mg--they just have not picked up yet Her updated medication list for this problem includes:    Simvastatin 40 Mg Tabs (Simvastatin) .Marland Kitchen... 1 tab by mouth daily  Problem # 3:  DIABETES MELLITUS, TYPE II, UNCONTROLLED (ICD-250.02) Need them to bring in sugars next visit. Her updated medication list for this problem includes:    Baby Aspirin 81 Mg Chew (Aspirin) .Marland Kitchen... Take 1 tablet by mouth once a day for stroke prevention    Glipizide 10 Mg Tabs (Glipizide) .Marland Kitchen... 1 by mouth once daily    Metformin Hcl 1000 Mg Tabs (Metformin hcl) .Marland Kitchen... 1 tab by mouth two times a day    Benazepril Hcl 10 Mg Tabs (  Benazepril hcl) .Marland Kitchen... 1 tab by mouth daily    Actos 30 Mg Tabs (Pioglitazone hcl) .Marland Kitchen... 1 tab by mouth daily  Orders: T-Urine Microalbumin w/creat. ratio [phone])  Problem # 4:  PROBLEMS WITH SWALLOWING AND MASTICATION (ICD-V41.6) Apparently did not get the final swallowing eval--just the modified barium study--will set that up.  Complete Medication List: 1)  Glucometer and Supplies  .... Check cbg two times a day and record dx=iddm uncontrolled 2)  Baby Aspirin 81 Mg Chew (Aspirin) .... Take 1 tablet by mouth once a day for stroke prevention 3)  Amlodipine Besylate 10 Mg Tabs (Amlodipine besylate) .Marland Kitchen.. 1 by mouth once daily 4)  Glipizide 10 Mg Tabs (Glipizide) .Marland Kitchen.. 1 by mouth once daily 5)   Metformin Hcl 1000 Mg Tabs (Metformin hcl) .Marland Kitchen.. 1 tab by mouth two times a day 6)  Benazepril Hcl 10 Mg Tabs (Benazepril hcl) .Marland Kitchen.. 1 tab by mouth daily 7)  Hydrochlorothiazide 12.5 Mg Tabs (Hydrochlorothiazide) .Marland Kitchen.. 1 tab by mouth in morning. 8)  Actos 30 Mg Tabs (Pioglitazone hcl) .Marland Kitchen.. 1 tab by mouth daily 9)  Zolpidem Tartrate 5 Mg Tabs (Zolpidem tartrate) .Marland Kitchen.. 1 by mouth at bedtime 10)  Plavix 75 Mg Tabs (Clopidogrel bisulfate) .Marland Kitchen.. 1 by mouth once daily 11)  Tamsulosin Hcl 0.4 Mg Caps (Tamsulosin hcl) .Marland Kitchen.. 1 by mouth once daily 12)  Meclizine Hcl 25 Mg Tabs (Meclizine hcl) .Marland Kitchen.. 1 by mouth q 6hours as needed 13)  Simvastatin 40 Mg Tabs (Simvastatin) .Marland Kitchen.. 1 tab by mouth daily 14)  One Touch Ultra Test Strips  .... Test three times a day  Other Orders: Capillary Blood Glucose/CBG [id number]) T-CBC w/Diff [id number])  Patient Instructions: 1)  Change next fasting lab visit to 6 weeks from now--FLP, liver enzymes 2)  Follow up with Dr. Amil Amen in 3 months --DM, vertigo, hyperlipidemia Prescriptions: ONE TOUCH ULTRA TEST STRIPS Test three times a day  #100 x 11   Entered and Authorized by:   Mack Hook MD   Signed by:   Mack Hook MD on 02/09/2009   Method used:   Print then Give to Patient   RxID:   [id number]   Appended Document: dizziness//gk Received a call on 02/10/09 from [place] at [place] [facility].  She stated not much compliance with therapy other than when they are there to work with her.  Lots of social issues.  Has had 8 weeks of OP therapy.  They are limited as to what the can do for vertigo in her home--much more available if they went to a facility for this.  No nystagmus. Will refer to [facility] outpatient Vertigo PT please--call and let pt. and family know they should be hearing from them shortly.  to call if they do   Please also contact Erin at phone number above and let her know we are going to try sending her to a facility,  left msg at [phone] for [facility]  Referral faxed............. Shellia Carwin CMA  February 24, 2009 2:38 PM   Clinical Lists Changes  Orders: Added new Referral order of Physical Therapy Referral (PT) - Signed

## 2010-03-10 NOTE — Miscellaneous (Signed)
Summary: [facility] --med change  Clinical Lists Changes  Medications: Changed medication from GLIPIZIDE 10 MG TABS (GLIPIZIDE) 1 by mouth once daily to GLIPIZIDE 10 MG TABS (GLIPIZIDE) 1/2 tab by mouth two times a day

## 2010-03-10 NOTE — Miscellaneous (Signed)
Summary: Rehab Report//DISCHARGE SUMMARY  Rehab Report//DISCHARGE SUMMARY   Imported By: Roland Earl 01/18/2010 14:22:17  _____________________________________________________________________  External Attachment:    Type:   Image     Comment:   External Document

## 2010-03-10 NOTE — Letter (Signed)
Summary: Michelle Lopez   Imported By: Roland Earl 04/27/2009 15:09:32  _____________________________________________________________________  External Attachment:    Type:   Image     Comment:   External Document

## 2010-03-10 NOTE — Progress Notes (Signed)
Summary: Office Visit  Office Visit   Imported By: Renato Battles 02/12/2009 17:03:28  _____________________________________________________________________  External Attachment:    Type:   Image     Comment:   External Document  Appended Document: Office Visit Tiffany--can you call the number in this scanned document and give my order for their requested therapy for speech and swallowing--Thanks

## 2010-03-10 NOTE — Assessment & Plan Note (Signed)
Summary: new pt/medicare/#/lb   Vital Signs:  Patient profile:   65 year old female Height:      60.5 inches Weight:      138.38 pounds BMI:     26.68 O2 Sat:      96 % on Room air Temp:     98.3 degrees F oral Pulse rate:   74 / minute Pulse rhythm:   regular Resp:     16 per minute BP sitting:   142 / 74  (left arm) Cuff size:   large  Vitals Entered By: Estell Harpin CMA (January 03, 2010 1:32 PM)  Nutrition Counseling: Patient's BMI is greater than 25 and therefore counseled on weight management options.  O2 Flow:  Room air CC: New to establish CBG Result 364   Primary Care Provider:  Janith Lima MD  CC:  New to establish.  History of Present Illness: New to me she needs a new PCP. She was recently admitted for a vesicular rash on her face that was felt to be due to herpes zoster. The rash has healed and does not bother her. She was told to hold all meds until she was seen for f/up.  Preventive Screening-Counseling & Management  Alcohol-Tobacco     Smoking Status: never  Caffeine-Diet-Exercise     Does Patient Exercise: yes      Drug Use:  no.    Current Medications (verified): 1)  Glucometer and Supplies .... 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 Take 1 Tablet By Mouth Once A Day For Stroke Prevention 3)  Amlodipine Besylate 10 Mg Tabs (Amlodipine Besylate) .Marland Kitchen.. 1 By Mouth Once Daily 4)  Glipizide 10 Mg Tabs (Glipizide) .... 1/2 Tab By Mouth Two Times A Day 5)  Metformin Hcl 1000 Mg Tabs (Metformin Hcl) .Marland Kitchen.. 1 Tab By Mouth Two Times A Day 6)  Benazepril Hcl 10 Mg Tabs (Benazepril Hcl) .Marland Kitchen.. 1 Tab By Mouth Daily 7)  Hydrochlorothiazide 12.5 Mg Tabs (Hydrochlorothiazide) .Marland Kitchen.. 1 Tab By Mouth in Morning. 8)  Actos 30 Mg Tabs (Pioglitazone Hcl) .Marland Kitchen.. 1 Tab By Mouth Daily 9)  Zolpidem Tartrate 5 Mg Tabs (Zolpidem Tartrate) .Marland Kitchen.. 1 By Mouth At Bedtime 10)  Plavix 75 Mg Tabs (Clopidogrel Bisulfate)  .Marland Kitchen.. 1 By Mouth Once Daily 11)  Tamsulosin Hcl 0.4 Mg Caps (Tamsulosin Hcl) .Marland Kitchen.. 1 By Mouth Once Daily 12)  Meclizine Hcl 25 Mg Tabs (Meclizine Hcl) .Marland Kitchen.. 1 By Mouth Q 6hours As Needed 13)  One Touch Ultra Test Strips .... Test Three Times A Day 14)  Lorazepam 1 Mg Tabs (Lorazepam) .... 1/2 To 1 Tab By Mouth Every 8 Hours As Needed For Anxiety 15)  Tub Bench .Marland Kitchen.. 386.2 and V12.59 16)  Miralax  Powd (Polyethylene Glycol 3350) .Marland KitchenMarland KitchenMarland Kitchen 17 G in 8 Oz Fluid By Mouth Daily--Decrease To 1/2 Dose If Stools Become Too Loose 17)  Gemfibrozil 600 Mg Tabs (Gemfibrozil) .Marland Kitchen.. 1 Tab By Mouth Two Times A Day With Meals  Allergies (verified): 1)  ! Morphine 2)  ! Simvastatin 3)  Pravastatin Sodium (Pravastatin Sodium)  Past History:  Social History: Last updated: 01/03/2010 Retired Divorced Never Smoked Alcohol use-no Drug use-no Regular exercise-yes  Risk Factors: Alcohol Use: 0 (01/14/2009) Caffeine Use: not every day (06/04/2007) Exercise: yes (01/03/2010)  Risk Factors: Smoking Status: never (01/03/2010)  Past Medical History: Reviewed history from 06/04/2007 and no changes required. Current Problems:  HYPERLIPIDEMIA (ICD-272.4) CEREBROVASCULAR DISEASE (  ICD-437.9) DIARRHEA, ACUTE (ICD-787.91) COCAINE ABUSE (ICD-305.60) ESSENTIAL HYPERTENSION, BENIGN (ICD-401.1) DIABETES MELLITUS, TYPE II, UNCONTROLLED (ICD-250.02)  Past Surgical History: Reviewed history from 07/03/2009 and no changes required. 1.  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 Dr.Mark Lorin [facility]  Family History: Family History Diabetes 1st degree relative Family History of Stroke F 1st degree relative <60 Family History of Stroke M 1st degree relative <50  Social History: Retired Divorced Never Smoked Alcohol use-no Drug use-no Regular exercise-yes Drug Use:  no Does Patient Exercise:  yes  Review of Systems  The patient denies anorexia, fever, weight loss, weight gain, chest pain, syncope, dyspnea on exertion, peripheral  edema, prolonged cough, headaches, hemoptysis, abdominal pain, hematuria, suspicious skin lesions, and depression.   Psych:  Complains of anxiety, depression, and easily tearful; denies irritability, mental problems, panic attacks, sense of great danger, suicidal thoughts/plans, thoughts of violence, unusual visions or sounds, and thoughts /plans of harming others. Endo:  Denies cold intolerance, excessive hunger, excessive thirst, excessive urination, heat intolerance, polyuria, and weight change.  Physical Exam  General:  alert, well-developed, well-nourished, well-hydrated, appropriate dress, normal appearance, healthy-appearing, cooperative to examination, and good hygiene.   Head:  she has multiple healed facial lesions that are small hypopigmented papules and two small scabs over her left eyebrow. there are no vesicles, pustules, or targets today. there is no swelling or angioedema. Eyes:  vision grossly intact, pupils round, pupils reactive to light, and no injection.   Mouth:  Oral mucosa and oropharynx without lesions or exudates.  Teeth in good repair. Neck:  supple, full ROM, no masses, no thyromegaly, no thyroid nodules or tenderness, no JVD, normal carotid upstroke, no carotid bruits, no cervical lymphadenopathy, and no neck tenderness.   Lungs:  normal respiratory effort, no intercostal retractions, no accessory muscle use, normal breath sounds, no dullness, no fremitus, no crackles, and no wheezes.   Heart:  normal rate, regular rhythm, no murmur, no gallop, no rub, and no JVD.   Abdomen:  soft, non-tender, normal bowel sounds, no distention, no masses, no guarding, no rigidity, no rebound tenderness, no abdominal hernia, no inguinal hernia, no hepatomegaly, and no splenomegaly.   Msk:  No deformity or scoliosis noted of thoracic or lumbar spine.   Pulses:  R and L carotid,radial,femoral,dorsalis pedis and posterior tibial pulses are full and equal bilaterally Extremities:  No  clubbing, cyanosis, edema, or deformity noted with normal full range of motion of all joints.   Neurologic:  alert & oriented X3, cranial nerves II-XII intact, Romberg negative, abnormal gait, LUE hyperreflexia, LUE weakness, LLE hyperreflexia, and LLE weakness.   Skin:  turgor normal, no suspicious lesions, no ecchymoses, no petechiae, no purpura, no ulcerations, and no edema.   Cervical Nodes:  no anterior cervical adenopathy and no posterior cervical adenopathy.   Axillary Nodes:  no R axillary adenopathy and no L axillary adenopathy.   Psych:  Oriented X3, memory intact for recent and remote, normally interactive, good eye contact, not agitated, not suicidal, dysphoric affect, and tearful.    Diabetes Management Exam:    Foot Exam (with socks and/or shoes not present):       Sensory-Pinprick/Light touch:          Left medial foot (L-4): normal          Left dorsal foot (L-5): normal          Left lateral foot (S-1): normal          Right medial foot (L-4): normal  Right dorsal foot (L-5): normal          Right lateral foot (S-1): normal       Sensory-Monofilament:          Left foot: normal          Right foot: normal       Inspection:          Left foot: normal          Right foot: normal       Nails:          Left foot: normal          Right foot: normal    Eye Exam:       Eye Exam done elsewhere          Date: 11/29/2009          Results: diabetic retinopathy          Done by: Rankin   Impression & Recommendations:  Problem # 1:  DEPRESSION (ICD-311) Assessment Unchanged  Her updated medication list for this problem includes:    Lorazepam 1 Mg Tabs (Lorazepam) .Marland Kitchen... 1/2 to 1 tab by mouth every 8 hours as needed for anxiety  Problem # 2:  DIABETES MELLITUS, TYPE II, UNCONTROLLED (ICD-250.02) Assessment: Deteriorated she will restart meds  Her updated medication list for this problem includes:    Baby Aspirin 81 Mg Chew (Aspirin) .Marland Kitchen... Take 1 tablet by mouth  once a day for stroke prevention    Glipizide 10 Mg Tabs (Glipizide) .Marland Kitchen... 1/2 tab by mouth two times a day    Metformin Hcl 1000 Mg Tabs (Metformin hcl) .Marland Kitchen... 1 tab by mouth two times a day    Benazepril Hcl 10 Mg Tabs (Benazepril hcl) .Marland Kitchen... 1 tab by mouth daily    Actos 30 Mg Tabs (Pioglitazone hcl) .Marland Kitchen... 1 tab by mouth daily  Labs Reviewed: Creat: 1.03 (01/14/2009)     Last Eye Exam: diabetic retinopathy (11/29/2009) Reviewed HgBA1c results: 9.0 (07/31/2008)  12.0 (06/04/2007)  Problem # 3:  ESSENTIAL HYPERTENSION, BENIGN (ICD-401.1) Assessment: Deteriorated she will restart meds Her updated medication list for this problem includes:    Amlodipine Besylate 10 Mg Tabs (Amlodipine besylate) .Marland Kitchen... 1 by mouth once daily    Benazepril Hcl 10 Mg Tabs (Benazepril hcl) .Marland Kitchen... 1 tab by mouth daily    Hydrochlorothiazide 12.5 Mg Tabs (Hydrochlorothiazide) .Marland Kitchen... 1 tab by mouth in morning.  Complete Medication List: 1)  Glucometer and Supplies  .... Check cbg two times a day and record dx=iddm uncontrolled 2)  Baby Aspirin 81 Mg Chew (Aspirin) .... Take 1 tablet by mouth once a day for stroke prevention 3)  Amlodipine Besylate 10 Mg Tabs (Amlodipine besylate) .Marland Kitchen.. 1 by mouth once daily 4)  Glipizide 10 Mg Tabs (Glipizide) .... 1/2 tab by mouth two times a day 5)  Metformin Hcl 1000 Mg Tabs (Metformin hcl) .Marland Kitchen.. 1 tab by mouth two times a day 6)  Benazepril Hcl 10 Mg Tabs (Benazepril hcl) .Marland Kitchen.. 1 tab by mouth daily 7)  Hydrochlorothiazide 12.5 Mg Tabs (Hydrochlorothiazide) .Marland Kitchen.. 1 tab by mouth in morning. 8)  Actos 30 Mg Tabs (Pioglitazone hcl) .Marland Kitchen.. 1 tab by mouth daily 9)  Plavix 75 Mg Tabs (Clopidogrel bisulfate) .Marland Kitchen.. 1 by mouth once daily 10)  Meclizine Hcl 25 Mg Tabs (Meclizine hcl) .Marland Kitchen.. 1 by mouth q 6hours as needed 11)  One Touch Ultra Test Strips  .... Test three times a day 12)  Lorazepam 1 Mg Tabs (  Lorazepam) .... 1/2 to 1 tab by mouth every 8 hours as needed for anxiety 13)  Tub Bench   .Marland Kitchen.. 386.2 and v12.59 14)  Miralax Powd (Polyethylene glycol 3350) .Marland KitchenMarland KitchenMarland Kitchen 17 g in 8 oz fluid by mouth daily--decrease to 1/2 dose if stools become too loose  Other Orders: Capillary Blood Glucose/CBG [id number])  Patient Instructions: 1)  Please schedule a follow-up appointment in 2 months. 2)  It is important that you exercise regularly at least 20 minutes 5 times a week. If you develop chest pain, have severe difficulty breathing, or feel very tired , stop exercising immediately and seek medical attention. 3)  You need to lose weight. Consider a lower calorie diet and regular exercise.  4)  Check your blood sugars regularly. If your readings are usually above 200 or below 70 you should contact our office. 5)  It is important that your Diabetic A1c level is checked every 3 months. 6)  See your eye doctor yearly to check for diabetic eye damage. 7)  Check your feet each night for sore areas, calluses or signs of infection. 8)  Check your Blood Pressure regularly. If it is above 130/80: you should make an appointment.   Orders Added: 1)  Capillary Blood Glucose/CBG [[id number]] 2)  New Patient Level III [id number]

## 2010-03-24 ENCOUNTER — Telehealth: Payer: Self-pay | Admitting: Internal Medicine

## 2010-03-28 ENCOUNTER — Ambulatory Visit (INDEPENDENT_AMBULATORY_CARE_PROVIDER_SITE_OTHER): Payer: Medicare Other | Admitting: Internal Medicine

## 2010-03-28 ENCOUNTER — Encounter (INDEPENDENT_AMBULATORY_CARE_PROVIDER_SITE_OTHER): Payer: Self-pay | Admitting: *Deleted

## 2010-03-28 ENCOUNTER — Other Ambulatory Visit: Payer: Self-pay | Admitting: Internal Medicine

## 2010-03-28 ENCOUNTER — Other Ambulatory Visit: Payer: Medicare Other

## 2010-03-28 ENCOUNTER — Encounter: Payer: Self-pay | Admitting: Internal Medicine

## 2010-03-28 DIAGNOSIS — I10 Essential (primary) hypertension: Secondary | ICD-10-CM

## 2010-03-28 DIAGNOSIS — B354 Tinea corporis: Secondary | ICD-10-CM

## 2010-03-28 DIAGNOSIS — E785 Hyperlipidemia, unspecified: Secondary | ICD-10-CM

## 2010-03-28 DIAGNOSIS — E1165 Type 2 diabetes mellitus with hyperglycemia: Secondary | ICD-10-CM

## 2010-03-28 LAB — LIPID PANEL: Triglycerides: 148 mg/dL (ref 0.0–149.0)

## 2010-03-28 LAB — BASIC METABOLIC PANEL
CO2: 29 mEq/L (ref 19–32)
Calcium: 9.5 mg/dL (ref 8.4–10.5)
Glucose, Bld: 113 mg/dL — ABNORMAL HIGH (ref 70–99)
Potassium: 4 mEq/L (ref 3.5–5.1)
Sodium: 139 mEq/L (ref 135–145)

## 2010-03-28 LAB — HEPATIC FUNCTION PANEL
AST: 17 U/L (ref 0–37)
Albumin: 3.7 g/dL (ref 3.5–5.2)
Alkaline Phosphatase: 45 U/L (ref 39–117)
Total Protein: 7 g/dL (ref 6.0–8.3)

## 2010-03-29 ENCOUNTER — Telehealth: Payer: Self-pay | Admitting: Internal Medicine

## 2010-03-30 NOTE — Progress Notes (Addendum)
Summary: Low CBG & Rash Ronnald Ramp PT   Phone Note Call from Patient   Caller: [phone] Summary of Call: Daughter called lmovm reporting a CBG of 60 x 3days and severe rash which she thinks may be from medication. She is requesting a call back  Follow-up for Phone Call        Spoke w/daughter - she is unsure what meds pt is taking exactly. Caregiver has reported to daughter that pt's fasting cbgs the last couple days have been in 60's. Pt feels ok except for itchy rash under her breast.   Daughter will go to pt's home and review pt's meds and get more info on rash Follow-up by: Charlsie Quest, CMA,  March 24, 2010 5:13 PM  Additional Follow-up for Phone Call Additional follow up Details #1::        Reviewed meds w/daughter. Pt is taking metformin 1000 mg two times a day, glipizide 10mg  1/2 tab two times a day and actos 30mg  two times a day.   MD removed actos at last office visit. Daughter remembers this but forgot to have pt stop med. She will stop now continue to monitor and f/u w/Dr Ronnald Ramp Monday.  RASH - Pt has itchy rash under her breasts x 4 days. Advised UC soon, daughter agreed and will take pt for eval tomorrow am.  Additional Follow-up by: Charlsie Quest, [place],  March 24, 2010 7:01 PM    Additional Follow-up for Phone Call Additional follow up Details #2::    ok Follow-up by: Neena Rhymes MD,  March 25, 2010 10:00 AM

## 2010-04-05 NOTE — Progress Notes (Signed)
Summary: med refills and new prescription request  Phone Note Call from Patient Call back at Home Phone [phone]   Caller: Daughter - Verdis Frederickson Summary of Call: Daughter called - she is req a call back regarding pt's medication  Initial call taken by: Charlsie Quest, [place],  March 29, 2010 11:14 AM  Follow-up for Phone Call        spoke w/Pt's daughter (per HIPAA)  1-Pt is needing Rx refill on Bleph -10 10% soluton for eyes.Marland Kitchen 2-Pt is also needing #90 Rx for Meclizine 25mg  as she is using three times a day (8,2,8) instead of #30. 3-Pt also needs a new Rx for Glipizide 5mg  due to change in dosage (from 10mg  to 5 mg) Pt's pharmacy callback [phone] Follow-up by: Mackey Birchwood [facility]),  March 29, 2010 5:08 PM    Prescriptions: GLIPIZIDE 5 MG XR24H-TAB (GLIPIZIDE) One by mouth once daily for diabetes  #30 x 11   Entered and Authorized by:   Janith Lima MD   Signed by:   Janith Lima MD on 03/30/2010   Method used:   [facility]        [facility] [address] [phone]* (retail)       [address]       [place], [place]  [address]       Ph: [phone] or [phone]       Fax: [phone]   RxID:   [id number] BLEPH-10 10 % SOLN (SULFACETAMIDE SODIUM) one gtt in each eye TID  #1 bottle x 1   Entered and Authorized by:   Janith Lima MD   Signed by:   Janith Lima MD on 03/30/2010   Method used:   [facility]ically to        [facility] [address] [phone]* (retail)       [place] [address]       [place], [place]  [address]       Ph: [phone] or [phone]       Fax: [phone]   RxID:   [id number] MECLIZINE HCL 25 MG TABS (MECLIZINE HCL) 1 by mouth q 6hours as needed  #90 x 5   Entered and Authorized by:   Janith Lima MD   Signed by:   Janith Lima MD on 03/30/2010   Method used:   [facility] to        [facility] [address] [phone]* (retail)       [place] [address]       [place], [place]  [address]       Ph: [phone] or [phone]       Fax: [phone]   RxID:   [id number]

## 2010-04-05 NOTE — Assessment & Plan Note (Signed)
Summary: discuss blood sugar/SD   Vital Signs:  Patient profile:   65 year old female Menstrual status:  postmenopausal Height:      60.5 inches Weight:      155 pounds BMI:     29.88 O2 Sat:      97 % on Room air Temp:     98.4 degrees F oral Pulse rate:   88 / minute Pulse rhythm:   regular Resp:     16 per minute BP sitting:   128 / 62  (left arm) Cuff size:   large  Vitals Entered By: [place] (March 28, 2010 10:29 AM)  O2 Flow:  Room air CC: Patient c/o bilateral breast rash x 1wk/// painful and burning, Lipid Management Is Patient Diabetic? Yes Did you bring your meter with you today? No Pain Assessment Patient in pain? yes     Location: Bilat Breast Type: burning  Does patient need assistance? Functional Status Self care Ambulation Normal   Primary Care Provider:  Janith Lima MD  CC:  Patient c/o bilateral breast rash x 1wk/// painful and burning and Lipid Management.  History of Present Illness: She returns c/o several days of low blood sugars wtih RBS=60 or so and feeling weak and irritable.  Also, she has had a painful/itchy rash under both breasts for several days. It is symmetrical.  Lipid Management History:      Positive NCEP/ATP III risk factors include female age 58 years old or older, diabetes, hypertension, and prior stroke (or TIA).  Negative NCEP/ATP III risk factors include HDL cholesterol greater than 60, no family history for ischemic heart disease, non-tobacco-user status, no ASHD (atherosclerotic heart disease), no peripheral vascular disease, and no history of aortic aneurysm.        The patient states that she knows about the "Therapeutic Lifestyle Change" diet.  Her compliance with the TLC diet is poor.  The patient expresses understanding of adjunctive measures for cholesterol lowering.  Adjunctive measures started by the patient include fiber, limit alcohol consumpton, and weight reduction.  She expresses no side effects  from her lipid-lowering medication.  The patient denies any symptoms to suggest myopathy or liver disease.    Preventive Screening-Counseling & Management  Alcohol-Tobacco     Alcohol drinks/day: 0     Alcohol Counseling: not indicated; patient does not drink     Feels need to cut down: no     Smoking Status: never     Tobacco Counseling: not indicated; no tobacco use  Hep-HIV-STD-Contraception     Hepatitis Risk: no risk noted     HIV Risk: no risk noted     STD Risk: no risk noted      Drug Use:  no.    Clinical Review Panels:  Immunizations   Last Tetanus Booster:  Tdap (11/16/2009)   Last Flu Vaccine:  Fluvax MCR (11/16/2009)   Last Pneumovax:  Historical (12/24/2008)  Lipid Management   Cholesterol:  204 (06/24/2009)   LDL (bad choesterol):  111 (06/24/2009)   HDL (good cholesterol):  60 (06/24/2009)  Diabetes Management   HgBA1C:  9.0 (07/31/2008)   Creatinine:  1.03 (01/14/2009)   Last Dilated Eye Exam:  diabetic retinopathy (11/29/2009)   Last Foot Exam:  yes (01/03/2010)   Last Flu Vaccine:  Fluvax MCR (11/16/2009)   Last Pneumovax:  Historical (12/24/2008)  CBC   WBC:  7.1 (02/09/2009)   RBC:  4.79 (02/09/2009)   Hgb:  12.8 (02/09/2009)   Hct:  40.5 (02/09/2009)   Platelets:  318 (02/09/2009)   MCV  84.6 (02/09/2009)   MCHC  31.6 (02/09/2009)   RDW  13.0 (02/09/2009)   PMN:  47 (02/09/2009)   Lymphs:  43 (02/09/2009)   Monos:  10 (02/09/2009)   Eosinophils:  1 (02/09/2009)   Basophil:  0 (02/09/2009)  Complete Metabolic Panel   Glucose:  169 (01/14/2009)   Sodium:  138 (01/14/2009)   Potassium:  5.1 (01/14/2009)   Chloride:  99 (01/14/2009)   CO2:  18 (01/14/2009)   BUN:  24 (01/14/2009)   Creatinine:  1.03 (01/14/2009)   Albumin:  4.4 (01/14/2009)   Total Protein:  8.0 (01/14/2009)   Calcium:  9.3 (01/14/2009)   Total Bili:  0.3 (01/14/2009)   Alk Phos:  53 (01/14/2009)   SGPT (ALT):  10 (01/14/2009)   SGOT (AST):  19  (01/14/2009)   Medications Prior to Update: 1)  Glucometer and Supplies .... 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 Take 1 Tablet By Mouth Once A Day For Stroke Prevention 3)  Amlodipine Besylate 10 Mg Tabs (Amlodipine Besylate) .Marland Kitchen.. 1 By Mouth Once Daily 4)  Glipizide 10 Mg Tabs (Glipizide) .... 1/2 Tab By Mouth Two Times A Day 5)  Metformin Hcl 1000 Mg Tabs (Metformin Hcl) .Marland Kitchen.. 1 Tab By Mouth Two Times A Day 6)  Benazepril Hcl 10 Mg Tabs (Benazepril Hcl) .Marland Kitchen.. 1 Tab By Mouth Daily 7)  Hydrochlorothiazide 12.5 Mg Tabs (Hydrochlorothiazide) .Marland Kitchen.. 1 Tab By Mouth in Morning. 8)  Plavix 75 Mg Tabs (Clopidogrel Bisulfate) .Marland Kitchen.. 1 By Mouth Once Daily 9)  Meclizine Hcl 25 Mg Tabs (Meclizine Hcl) .Marland Kitchen.. 1 By Mouth Q 6hours As Needed 10)  One Touch Ultra Test Strips .... Test Three Times A Day 11)  Lorazepam 1 Mg Tabs (Lorazepam) .... 1/2 To 1 Tab By Mouth Every 8 Hours As Needed For Anxiety 12)  Tub Bench .Marland Kitchen.. 386.2 and V12.59 13)  Miralax  Powd (Polyethylene Glycol 3350) .Marland KitchenMarland KitchenMarland Kitchen 17 G in 8 Oz Fluid By Mouth Daily--Decrease To 1/2 Dose If Stools Become Too Loose 14)  Flomax 0.4 Mg Caps (Tamsulosin Hcl) .... Take 1 Tablet By Mouth Once A Day 15)  Bleph-10 10 % Soln (Sulfacetamide Sodium) .... One Gtt in Each Eye Tid  Current Medications (verified): 1)  Glucometer and Supplies .... 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 Take 1 Tablet By Mouth Once A Day For Stroke Prevention 3)  Amlodipine Besylate 10 Mg Tabs (Amlodipine Besylate) .Marland Kitchen.. 1 By Mouth Once Daily 4)  Metformin Hcl 1000 Mg Tabs (Metformin Hcl) .Marland Kitchen.. 1 Tab By Mouth Two Times A Day 5)  Benazepril Hcl 10 Mg Tabs (Benazepril Hcl) .Marland Kitchen.. 1 Tab By Mouth Daily 6)  Hydrochlorothiazide 12.5 Mg Tabs (Hydrochlorothiazide) .Marland Kitchen.. 1 Tab By Mouth in Morning. 7)  Plavix 75 Mg Tabs (Clopidogrel Bisulfate) .Marland Kitchen.. 1 By Mouth Once Daily 8)   Meclizine Hcl 25 Mg Tabs (Meclizine Hcl) .Marland Kitchen.. 1 By Mouth Q 6hours As Needed 9)  One Touch Ultra Test Strips .... Test Three Times A Day 10)  Lorazepam 1 Mg Tabs (Lorazepam) .... 1/2 To 1 Tab By Mouth Every 8 Hours As Needed For Anxiety 11)  Tub Bench .Marland Kitchen.. 386.2 and V12.59 12)  Miralax  Powd (Polyethylene Glycol 3350) .Marland KitchenMarland KitchenMarland Kitchen 17 G in 8 Oz Fluid By Mouth Daily--Decrease To 1/2 Dose  If Stools Become Too Loose 13)  Flomax 0.4 Mg Caps (Tamsulosin Hcl) .... Take 1 Tablet By Mouth Once A Day 14)  Bleph-10 10 % Soln (Sulfacetamide Sodium) .... One Gtt in Each Eye Tid 15)  Glipizide 5 Mg Xr24h-Tab (Glipizide) .... One By Mouth Once Daily For Diabetes 16)  Ketoconazole 2 % Crea (Ketoconazole) .... Apply To Aa Under Both Breasts Two Times A Day For 14 Days  Allergies (verified): 1)  ! Morphine 2)  ! Simvastatin 3)  Pravastatin Sodium (Pravastatin Sodium)  Past History:  Past Medical History: Last updated: 06/04/2007 Current Problems:  HYPERLIPIDEMIA (ICD-272.4) CEREBROVASCULAR DISEASE (ICD-437.9) DIARRHEA, ACUTE (ICD-787.91) COCAINE ABUSE (ICD-305.60) ESSENTIAL HYPERTENSION, BENIGN (ICD-401.1) DIABETES MELLITUS, TYPE II, UNCONTROLLED (ICD-250.02)  Past Surgical History: Last updated: 07/03/2009 1.  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 Dr.Mark Lorin [facility]  Family History: Last updated: 01/03/2010 Family History Diabetes 1st degree relative Family History of Stroke F 1st degree relative <60 Family History of Stroke M 1st degree relative <50  Social History: Last updated: 01/03/2010 Retired Divorced Never Smoked Alcohol use-no Drug use-no Regular exercise-yes  Risk Factors: Alcohol Use: 0 (03/28/2010) Caffeine Use: not every day (06/04/2007) Exercise: yes (01/03/2010)  Risk Factors: Smoking Status: never (03/28/2010)  Family History: Reviewed history from 01/03/2010 and no changes required. Family History Diabetes 1st degree relative Family History of Stroke F 1st degree relative  <60 Family History of Stroke M 1st degree relative <50  Social History: Reviewed history from 01/03/2010 and no changes required. Retired Divorced Never Smoked Alcohol use-no Drug use-no Regular exercise-yes Hepatitis Risk:  no risk noted HIV Risk:  no risk noted STD Risk:  no risk noted  Review of Systems       The patient complains of weight gain.  The patient denies anorexia, fever, weight loss, chest pain, syncope, dyspnea on exertion, peripheral edema, prolonged cough, headaches, hemoptysis, abdominal pain, hematuria, muscle weakness, enlarged lymph nodes, and angioedema.   Derm:  Complains of itching and rash; denies changes in color of skin, changes in nail beds, dryness, excessive perspiration, flushing, lesion(s), and poor wound healing. Endo:  Denies cold intolerance, excessive hunger, excessive thirst, excessive urination, heat intolerance, polyuria, and weight change.  Physical Exam  General:  alert, well-developed, well-nourished, well-hydrated, appropriate dress, normal appearance, healthy-appearing, cooperative to examination, and good hygiene.   Head:  she has multiple healed facial lesions that are small hypopigmented papules and two small scabs over her left eyebrow. there are no vesicles, pustules, or targets today. there is no swelling or angioedema. Mouth:  Oral mucosa and oropharynx without lesions or exudates.  Teeth in good repair. Neck:  supple, full ROM, no masses, no thyromegaly, no thyroid nodules or tenderness, no JVD, normal carotid upstroke, no carotid bruits, no cervical lymphadenopathy, and no neck tenderness.   Lungs:  normal respiratory effort, no intercostal retractions, no accessory muscle use, normal breath sounds, no dullness, no fremitus, no crackles, and no wheezes.   Heart:  normal rate, regular rhythm, no murmur, no gallop, no rub, and no JVD.   Abdomen:  soft, non-tender, normal bowel sounds, no distention, no masses, no guarding, no rigidity,  no rebound tenderness, no abdominal hernia, no inguinal hernia, no hepatomegaly, and no splenomegaly.   Msk:  No deformity or scoliosis noted of thoracic or lumbar spine.   Pulses:  R and L carotid,radial,femoral,dorsalis pedis and posterior tibial pulses are full and equal bilaterally Extremities:  No clubbing, cyanosis, edema, or deformity noted with normal full range of motion of  all joints.   Neurologic:  alert & oriented X3, cranial nerves II-XII intact, Romberg negative, abnormal gait, LUE hyperreflexia, LUE weakness, LLE hyperreflexia, and LLE weakness.   Skin:  she has a symmetrical rash under both breasts and over her lower sternum that is scaly/erythematous/serpiginous and coalesced in some areas, there is no exudate, streaking, induration, fluctuance, vesicles, targets, or fissures. Cervical Nodes:  no anterior cervical adenopathy and no posterior cervical adenopathy.   Axillary Nodes:  no R axillary adenopathy and no L axillary adenopathy.   Psych:  Oriented X3, memory intact for recent and remote, normally interactive, good eye contact, not anxious appearing, not depressed appearing, and not agitated.     Impression & Recommendations:  Problem # 1:  DIABETES MELLITUS, TYPE II, UNCONTROLLED (ICD-250.02) Assessment Deteriorated  The following medications were removed from the medication list:    Glipizide 10 Mg Tabs (Glipizide) .Marland Kitchen... 1/2 tab by mouth two times a day Her updated medication list for this problem includes:    Baby Aspirin 81 Mg Chew (Aspirin) .Marland Kitchen... Take 1 tablet by mouth once a day for stroke prevention    Metformin Hcl 1000 Mg Tabs (Metformin hcl) .Marland Kitchen... 1 tab by mouth two times a day    Benazepril Hcl 10 Mg Tabs (Benazepril hcl) .Marland Kitchen... 1 tab by mouth daily    Glipizide 5 Mg Xr24h-tab (Glipizide) ..... One by mouth once daily for diabetes  Orders: Venipuncture [id number]) TLB-Lipid Panel (80061-LIPID) TLB-BMP (Basic Metabolic Panel-BMET)  (99991111) TLB-Hepatic/Liver Function Pnl (80076-HEPATIC) TLB-A1C / Hgb A1C (Glycohemoglobin) (83036-A1C) Diabetic Clinic Referral (Diabetic) Nutrition Referral (Nutrition)  Problem # 2:  TINEA CORPORIS (ICD-110.5) Assessment: New start Ketoconazole Cream  Problem # 3:  ESSENTIAL HYPERTENSION, BENIGN (ICD-401.1) Assessment: Unchanged  Her updated medication list for this problem includes:    Amlodipine Besylate 10 Mg Tabs (Amlodipine besylate) .Marland Kitchen... 1 by mouth once daily    Benazepril Hcl 10 Mg Tabs (Benazepril hcl) .Marland Kitchen... 1 tab by mouth daily    Hydrochlorothiazide 12.5 Mg Tabs (Hydrochlorothiazide) .Marland Kitchen... 1 tab by mouth in morning.  BP today: 128/62 Prior BP: 116/68 (01/25/2010)  Prior 10 Yr Risk Heart Disease: 8 % (01/25/2010)  Labs Reviewed: K+: 5.1 (01/14/2009) Creat: : 1.03 (01/14/2009)   Chol: 204 (06/24/2009)   HDL: 60 (06/24/2009)   LDL: 111 (06/24/2009)   TG: 166 (06/24/2009)  Problem # 4:  HYPERLIPIDEMIA (ICD-272.4) Assessment: Unchanged  Orders: Venipuncture [id number]) TLB-Lipid Panel (80061-LIPID) TLB-BMP (Basic Metabolic Panel-BMET) (99991111) TLB-Hepatic/Liver Function Pnl (80076-HEPATIC) TLB-A1C / Hgb A1C (Glycohemoglobin) (83036-A1C)  Labs Reviewed: SGOT: 19 (01/14/2009)   SGPT: 10 (01/14/2009)  Prior 10 Yr Risk Heart Disease: 8 % (01/25/2010)   HDL:60 (06/24/2009), 56 (01/14/2009)  LDL:111 (06/24/2009), 114 (01/14/2009)  Chol:204 (06/24/2009), 196 (01/14/2009)  Trig:166 (06/24/2009), 132 (01/14/2009)  Complete Medication List: 1)  Glucometer and Supplies  .... Check cbg two times a day and record dx=iddm uncontrolled 2)  Baby Aspirin 81 Mg Chew (Aspirin) .... Take 1 tablet by mouth once a day for stroke prevention 3)  Amlodipine Besylate 10 Mg Tabs (Amlodipine besylate) .Marland Kitchen.. 1 by mouth once daily 4)  Metformin Hcl 1000 Mg Tabs (Metformin hcl) .Marland Kitchen.. 1 tab by mouth two times a day 5)  Benazepril Hcl 10 Mg Tabs (Benazepril hcl) .Marland Kitchen.. 1 tab by mouth  daily 6)  Hydrochlorothiazide 12.5 Mg Tabs (Hydrochlorothiazide) .Marland Kitchen.. 1 tab by mouth in morning. 7)  Plavix 75 Mg Tabs (Clopidogrel bisulfate) .Marland Kitchen.. 1 by mouth once daily 8)  Meclizine Hcl 25 Mg Tabs (Meclizine hcl) .Marland KitchenMarland KitchenMarland Kitchen  1 by mouth q 6hours as needed 9)  One Touch Ultra Test Strips  .... Test three times a day 10)  Lorazepam 1 Mg Tabs (Lorazepam) .... 1/2 to 1 tab by mouth every 8 hours as needed for anxiety 11)  Tub Bench  .Marland Kitchen.. 386.2 and v12.59 12)  Miralax Powd (Polyethylene glycol 3350) .Marland KitchenMarland KitchenMarland Kitchen 17 g in 8 oz fluid by mouth daily--decrease to 1/2 dose if stools become too loose 13)  Flomax 0.4 Mg Caps (Tamsulosin hcl) .... Take 1 tablet by mouth once a day 14)  Bleph-10 10 % Soln (Sulfacetamide sodium) .... One gtt in each eye tid 15)  Glipizide 5 Mg Xr24h-tab (Glipizide) .... One by mouth once daily for diabetes 16)  Ketoconazole 2 % Crea (Ketoconazole) .... Apply to aa under both breasts two times a day for 14 days  Lipid Assessment/Plan:      Based on NCEP/ATP III, the patient's risk factor category is "history of coronary disease, peripheral vascular disease, cerebrovascular disease, or aortic aneurysm along with either diabetes, current smoker, or LDL > 130 plus HDL < 40 plus triglycerides > 200".  The patient's lipid goals are as follows: Total cholesterol goal is 200; LDL cholesterol goal is 70; HDL cholesterol goal is 40; Triglyceride goal is 150.     Patient Instructions: 1)  Please schedule a follow-up appointment in 1 month. 2)  It is important that you exercise regularly at least 20 minutes 5 times a week. If you develop chest pain, have severe difficulty breathing, or feel very tired , stop exercising immediately and seek medical attention. 3)  You need to lose weight. Consider a lower calorie diet and regular exercise.  4)  Check your blood sugars regularly. If your readings are usually above 200 or below 70 you should contact our office. 5)  It is important that your Diabetic A1c  level is checked every 3 months. 6)  See your eye doctor yearly to check for diabetic eye damage. 7)  Check your feet each night for sore areas, calluses or signs of infection. 8)  Check your Blood Pressure regularly. If it is above 130/80: you should make an appointment. Prescriptions: KETOCONAZOLE 2 % CREA (KETOCONAZOLE) Apply to AA under both breasts two times a day for 14 days  #100 gms x 2   Entered and Authorized by:   Janith Lima MD   Signed by:   Janith Lima MD on 03/28/2010   Method used:   [facility]        [facility] [address] [phone]* (retail)       [address]       [place], [place]  [address]       Ph: [phone] or [phone]       Fax: [phone]   RxID:   [id number] GLIPIZIDE 5 MG XR24H-TAB (GLIPIZIDE) One by mouth once daily for diabetes  #30 x 11   Entered and Authorized by:   Janith Lima MD   Signed by:   Janith Lima MD on 03/28/2010   Method used:   [facility] to        [facility] [address] [phone]* (retail)       [place] [address]       [place],   [address]       Ph: [phone] or [phone]       Fax: [phone]  [id number]    Orders Added: 1)  Venipuncture [id number] 2)  TLB-Lipid Panel [80061-LIPID] 3)  TLB-BMP (Basic Metabolic Panel-BMET) 123456 4)  TLB-Hepatic/Liver Function Pnl [80076-HEPATIC] 5)  TLB-A1C / Hgb A1C (Glycohemoglobin) [83036-A1C] 6)  Diabetic Clinic Referral [Diabetic] 7)  Nutrition Referral [Nutrition] 8)  Est. Patient Level IV [id number]

## 2010-04-05 NOTE — Letter (Signed)
Summary: Lipid Letter  [facility]   [place], [place] [address]   Phone: [phone]  Fax: [phone]    03/28/2010  Michelle Lopez [place], [place]  [address]  Dear Ms. Tennant:  We have carefully reviewed your last lipid profile from 06/24/2009 and the results are noted below with a summary of recommendations for lipid management.    Cholesterol:       242     Goal: <200   HDL "good" Cholesterol:   49.70     Goal: >40   LDL "bad" Cholesterol:   174     Goal: <70   Triglycerides:       148.0     Goal: <150    blood sugars are a little too high    TLC Diet (Therapeutic Lifestyle Change): Saturated Fats & Transfatty acids should be kept < 7% of total calories ***Reduce Saturated Fats Polyunstaurated Fat can be up to 10% of total calories Monounsaturated Fat Fat can be up to 20% of total calories Total Fat should be no greater than 25-35% of total calories Carbohydrates should be 50-60% of total calories Protein should be approximately 15% of total calories Fiber should be at least 20-30 grams a day ***Increased fiber may help lower LDL Total Cholesterol should be < 200mg /day Consider adding plant stanol/sterols to diet (example: Benacol spread) ***A higher intake of unsaturated fat may reduce Triglycerides and Increase HDL    Adjunctive Measures (may lower LIPIDS and reduce risk of Heart Attack) include: Aerobic Exercise (20-30 minutes 3-4 times a week) Limit Alcohol Consumption Weight Reduction Aspirin 75-81 mg a day by mouth (if not allergic or contraindicated) Dietary Fiber 20-30 grams a day by mouth     Current Medications: 1)    Glucometer and Supplies  .... Check cbg two times a day and record dx=iddm uncontrolled 2)    Baby Aspirin 81 Mg  Chew (Aspirin) .... Take 1 tablet by mouth once a day for stroke prevention 3)    Amlodipine Besylate 10 Mg Tabs (Amlodipine besylate) .Marland Kitchen.. 1 by mouth once daily 4)     Metformin Hcl 1000 Mg Tabs (Metformin hcl) .Marland Kitchen.. 1 tab by mouth two times a day 5)    Benazepril Hcl 10 Mg Tabs (Benazepril hcl) .Marland Kitchen.. 1 tab by mouth daily 6)    Hydrochlorothiazide 12.5 Mg Tabs (Hydrochlorothiazide) .Marland Kitchen.. 1 tab by mouth in morning. 7)    Plavix 75 Mg Tabs (Clopidogrel bisulfate) .Marland Kitchen.. 1 by mouth once daily 8)    Meclizine Hcl 25 Mg Tabs (Meclizine hcl) .Marland Kitchen.. 1 by mouth q 6hours as needed 9)    One Touch Ultra Test Strips  .... Test three times a day 10)    Lorazepam 1 Mg Tabs (Lorazepam) .... 1/2 to 1 tab by mouth every 8 hours as needed for anxiety 11)    Tub Bench  .Marland Kitchen.. 386.2 and v12.59 12)    Miralax  Powd (Polyethylene glycol 3350) .Marland KitchenMarland KitchenMarland Kitchen 17 g in 8 oz fluid by mouth daily--decrease to 1/2 dose if stools become too loose 13)    Flomax 0.4 Mg Caps (Tamsulosin hcl) .... Take 1 tablet by mouth once a day 14)    Bleph-10 10 % Soln (Sulfacetamide sodium) .... One gtt in each eye tid 15)    Glipizide 5 Mg Xr24h-tab (Glipizide) .... One by mouth once daily for diabetes 16)    Ketoconazole 2 % Crea (Ketoconazole) .... Apply to  aa under both breasts two times a day for 14 days  If you have any questions, please call. We appreciate being able to work with you.   Sincerely,    [facility] Janith Lima MD

## 2010-04-19 LAB — GLUCOSE, CAPILLARY
Glucose-Capillary: 200 mg/dL — ABNORMAL HIGH (ref 70–99)
Glucose-Capillary: 208 mg/dL — ABNORMAL HIGH (ref 70–99)
Glucose-Capillary: 255 mg/dL — ABNORMAL HIGH (ref 70–99)
Glucose-Capillary: 272 mg/dL — ABNORMAL HIGH (ref 70–99)
Glucose-Capillary: 358 mg/dL — ABNORMAL HIGH (ref 70–99)
Glucose-Capillary: 365 mg/dL — ABNORMAL HIGH (ref 70–99)
Glucose-Capillary: 442 mg/dL — ABNORMAL HIGH (ref 70–99)
Glucose-Capillary: 443 mg/dL — ABNORMAL HIGH (ref 70–99)
Glucose-Capillary: 146 mg/dL — ABNORMAL HIGH (ref 70–99)
Glucose-Capillary: 170 mg/dL — ABNORMAL HIGH (ref 70–99)
Glucose-Capillary: 181 mg/dL — ABNORMAL HIGH (ref 70–99)
Glucose-Capillary: 251 mg/dL — ABNORMAL HIGH (ref 70–99)
Glucose-Capillary: 266 mg/dL — ABNORMAL HIGH (ref 70–99)
Glucose-Capillary: 275 mg/dL — ABNORMAL HIGH (ref 70–99)
Glucose-Capillary: 294 mg/dL — ABNORMAL HIGH (ref 70–99)
Glucose-Capillary: 368 mg/dL — ABNORMAL HIGH (ref 70–99)

## 2010-04-19 LAB — CBC
HCT: 35.4 % — ABNORMAL LOW (ref 36.0–46.0)
Hemoglobin: 11.5 g/dL — ABNORMAL LOW (ref 12.0–15.0)
Hemoglobin: 11.5 g/dL — ABNORMAL LOW (ref 12.0–15.0)
Hemoglobin: 12.7 g/dL (ref 12.0–15.0)
Hemoglobin: 12.3 g/dL (ref 12.0–15.0)
MCH: 26.6 pg (ref 26.0–34.0)
MCH: 26.6 pg (ref 26.0–34.0)
MCH: 26.6 pg (ref 26.0–34.0)
MCH: 27.1 pg (ref 26.0–34.0)
MCHC: 31.9 g/dL (ref 30.0–36.0)
MCHC: 31.9 g/dL (ref 30.0–36.0)
MCHC: 32.5 g/dL (ref 30.0–36.0)
MCV: 82.7 fL (ref 78.0–100.0)
MCV: 83.6 fL (ref 78.0–100.0)
MCV: 84 fL (ref 78.0–100.0)
MCV: 83.5 fL (ref 78.0–100.0)
Platelets: 241 10*3/uL (ref 150–400)
Platelets: 246 10*3/uL (ref 150–400)
Platelets: 273 10*3/uL (ref 150–400)
Platelets: 243 10*3/uL (ref 150–400)
RBC: 4.69 MIL/uL (ref 3.87–5.11)
RBC: 4.13 MIL/uL (ref 3.87–5.11)
RDW: 13 % (ref 11.5–15.5)
RDW: 13.1 % (ref 11.5–15.5)
RDW: 13.1 % (ref 11.5–15.5)
WBC: 10.9 10*3/uL — ABNORMAL HIGH (ref 4.0–10.5)
WBC: 8.2 10*3/uL (ref 4.0–10.5)
WBC: 9.1 10*3/uL (ref 4.0–10.5)

## 2010-04-19 LAB — COMPREHENSIVE METABOLIC PANEL
ALT: 8 U/L (ref 0–35)
AST: 15 U/L (ref 0–37)
Alkaline Phosphatase: 58 U/L (ref 39–117)
CO2: 26 mEq/L (ref 19–32)
Chloride: 105 mEq/L (ref 96–112)
GFR calc non Af Amer: >60 mL/min (ref >60)
Glucose, Bld: 142 mg/dL — ABNORMAL HIGH (ref 70–99)
Potassium: 4.6 mEq/L (ref 3.5–5.1)
Sodium: 139 mEq/L (ref 135–145)
Total Bilirubin: 0.5 mg/dL (ref 0.3–1.2)

## 2010-04-19 LAB — EYE CULTURE

## 2010-04-19 LAB — BASIC METABOLIC PANEL
BUN: 16 mg/dL (ref 6–23)
BUN: 12 mg/dL (ref 6–23)
CO2: 26 mEq/L (ref 19–32)
CO2: 28 mEq/L (ref 19–32)
Calcium: 8.6 mg/dL (ref 8.4–10.5)
Calcium: 8.5 mg/dL (ref 8.4–10.5)
Calcium: 8.5 mg/dL (ref 8.4–10.5)
Calcium: 8.8 mg/dL (ref 8.4–10.5)
Chloride: 101 mEq/L (ref 96–112)
Chloride: 101 mEq/L (ref 96–112)
Creatinine, Ser: 0.73 mg/dL (ref 0.4–1.2)
Creatinine, Ser: 0.94 mg/dL (ref 0.4–1.2)
Creatinine, Ser: 0.81 mg/dL (ref 0.4–1.2)
GFR calc Af Amer: >60 mL/min (ref >60)
GFR calc Af Amer: >60 mL/min (ref >60)
GFR calc non Af Amer: >60 mL/min (ref >60)
GFR calc non Af Amer: >60 mL/min (ref >60)
GFR calc non Af Amer: >60 mL/min (ref >60)
Glucose, Bld: 234 mg/dL — ABNORMAL HIGH (ref 70–99)
Glucose, Bld: 240 mg/dL — ABNORMAL HIGH (ref 70–99)
Glucose, Bld: 269 mg/dL — ABNORMAL HIGH (ref 70–99)
Potassium: 3.3 mEq/L — ABNORMAL LOW (ref 3.5–5.1)
Potassium: 3.9 mEq/L (ref 3.5–5.1)
Sodium: 140 mEq/L (ref 135–145)
Sodium: 139 mEq/L (ref 135–145)
Sodium: 140 mEq/L (ref 135–145)

## 2010-04-19 LAB — URINE CULTURE: Culture  Setup Time: 201111031755

## 2010-04-19 LAB — DIFFERENTIAL
Basophils Absolute: 0 10*3/uL (ref 0.0–0.1)
Basophils Relative: 0 % (ref 0–1)
Eosinophils Absolute: 0 10*3/uL (ref 0.0–0.7)
Eosinophils Relative: 0 % (ref 0–5)
Lymphocytes Relative: 43 % (ref 12–46)
Lymphs Abs: 1.8 10*3/uL (ref 0.7–4.0)
Monocytes Relative: 15 % — ABNORMAL HIGH (ref 3–12)
Neutro Abs: 3.5 10*3/uL (ref 1.7–7.7)
Neutrophils Relative %: 43 % (ref 43–77)

## 2010-04-19 LAB — CULTURE, BLOOD (ROUTINE X 2)
Culture  Setup Time: 201111040149
Culture  Setup Time: 201111040149
Culture: NO GROWTH

## 2010-04-19 LAB — URINALYSIS, ROUTINE W REFLEX MICROSCOPIC
Bilirubin Urine: NEGATIVE
Ketones, ur: NEGATIVE mg/dL
Nitrite: NEGATIVE
Urobilinogen, UA: 0.2 mg/dL (ref 0.0–1.0)
pH: 5.5 (ref 5.0–8.0)

## 2010-04-19 LAB — CK TOTAL AND CKMB (NOT AT ARMC): Relative Index: INVALID (ref 0.0–2.5)

## 2010-04-19 LAB — URINE MICROSCOPIC-ADD ON

## 2010-04-19 LAB — GONOCOCCUS CULTURE

## 2010-04-19 LAB — HIV ANTIBODY (ROUTINE TESTING W REFLEX): HIV: NONREACTIVE

## 2010-05-11 ENCOUNTER — Telehealth: Payer: Self-pay | Admitting: *Deleted

## 2010-05-11 NOTE — Telephone Encounter (Signed)
done

## 2010-05-11 NOTE — Telephone Encounter (Signed)
Pt's daughter is req referral to podiatrist - Dr Caren Macadam - [phone]

## 2010-05-13 LAB — URINALYSIS, ROUTINE W REFLEX MICROSCOPIC
Bilirubin Urine: NEGATIVE
Glucose, UA: NEGATIVE mg/dL
Hgb urine dipstick: NEGATIVE
Ketones, ur: NEGATIVE mg/dL
Protein, ur: NEGATIVE mg/dL
Urobilinogen, UA: 1 mg/dL (ref 0.0–1.0)

## 2010-05-13 LAB — COMPREHENSIVE METABOLIC PANEL
ALT: 26 U/L (ref 0–35)
AST: 28 U/L (ref 0–37)
Albumin: 3.7 g/dL (ref 3.5–5.2)
Alkaline Phosphatase: 67 U/L (ref 39–117)
Calcium: 9.4 mg/dL (ref 8.4–10.5)
GFR calc Af Amer: >60 mL/min (ref >60)
Glucose, Bld: 244 mg/dL — ABNORMAL HIGH (ref 70–99)
Potassium: 4 mEq/L (ref 3.5–5.1)
Sodium: 135 mEq/L (ref 135–145)
Total Protein: 7.6 g/dL (ref 6.0–8.3)

## 2010-05-13 LAB — URINE CULTURE: Culture: NO GROWTH

## 2010-05-13 LAB — CBC
Hemoglobin: 12.7 g/dL (ref 12.0–15.0)
RDW: 13.5 % (ref 11.5–15.5)

## 2010-05-13 LAB — DIFFERENTIAL
Basophils Relative: 1 % (ref 0–1)
Eosinophils Absolute: 0 10*3/uL (ref 0.0–0.7)
Eosinophils Relative: 1 % (ref 0–5)
Lymphs Abs: 1.9 10*3/uL (ref 0.7–4.0)
Monocytes Absolute: 0.7 10*3/uL (ref 0.1–1.0)
Monocytes Relative: 9 % (ref 3–12)
Neutrophils Relative %: 62 % (ref 43–77)

## 2010-05-14 LAB — CBC
HCT: 36.4 % (ref 36.0–46.0)
Hemoglobin: 12.1 g/dL (ref 12.0–15.0)
MCHC: 33.2 g/dL (ref 30.0–36.0)
MCV: 83.1 fL (ref 78.0–100.0)
Platelets: 250 10*3/uL (ref 150–400)
Platelets: 265 10*3/uL (ref 150–400)
RBC: 4.42 MIL/uL (ref 3.87–5.11)
WBC: 6.5 10*3/uL (ref 4.0–10.5)
WBC: 7.9 10*3/uL (ref 4.0–10.5)

## 2010-05-14 LAB — URINALYSIS, ROUTINE W REFLEX MICROSCOPIC
Glucose, UA: NEGATIVE mg/dL
Protein, ur: NEGATIVE mg/dL
Specific Gravity, Urine: 1.007 (ref 1.005–1.030)
pH: 6.5 (ref 5.0–8.0)

## 2010-05-14 LAB — CK TOTAL AND CKMB (NOT AT ARMC): Relative Index: INVALID (ref 0.0–2.5)

## 2010-05-14 LAB — GLUCOSE, CAPILLARY
Glucose-Capillary: 119 mg/dL — ABNORMAL HIGH (ref 70–99)
Glucose-Capillary: 119 mg/dL — ABNORMAL HIGH (ref 70–99)
Glucose-Capillary: 120 mg/dL — ABNORMAL HIGH (ref 70–99)
Glucose-Capillary: 122 mg/dL — ABNORMAL HIGH (ref 70–99)
Glucose-Capillary: 134 mg/dL — ABNORMAL HIGH (ref 70–99)
Glucose-Capillary: 141 mg/dL — ABNORMAL HIGH (ref 70–99)
Glucose-Capillary: 151 mg/dL — ABNORMAL HIGH (ref 70–99)
Glucose-Capillary: 152 mg/dL — ABNORMAL HIGH (ref 70–99)
Glucose-Capillary: 152 mg/dL — ABNORMAL HIGH (ref 70–99)
Glucose-Capillary: 196 mg/dL — ABNORMAL HIGH (ref 70–99)
Glucose-Capillary: 65 mg/dL — ABNORMAL LOW (ref 70–99)
Glucose-Capillary: 70 mg/dL (ref 70–99)
Glucose-Capillary: 91 mg/dL (ref 70–99)
Glucose-Capillary: 91 mg/dL (ref 70–99)

## 2010-05-14 LAB — RAPID URINE DRUG SCREEN, HOSP PERFORMED
Benzodiazepines: NOT DETECTED
Cocaine: NOT DETECTED
Opiates: NOT DETECTED
Tetrahydrocannabinol: NOT DETECTED

## 2010-05-14 LAB — COMPREHENSIVE METABOLIC PANEL
ALT: 17 U/L (ref 0–35)
Alkaline Phosphatase: 56 U/L (ref 39–117)
CO2: 29 mEq/L (ref 19–32)
GFR calc non Af Amer: >60 mL/min (ref >60)
Glucose, Bld: 263 mg/dL — ABNORMAL HIGH (ref 70–99)
Potassium: 3.6 mEq/L (ref 3.5–5.1)
Sodium: 138 mEq/L (ref 135–145)

## 2010-05-14 LAB — DIFFERENTIAL
Eosinophils Absolute: 0 10*3/uL (ref 0.0–0.7)
Eosinophils Relative: 0 % (ref 0–5)
Lymphocytes Relative: 32 % (ref 12–46)
Lymphs Abs: 2.6 10*3/uL (ref 0.7–4.0)
Monocytes Absolute: 0.6 10*3/uL (ref 0.1–1.0)
Monocytes Relative: 7 % (ref 3–12)

## 2010-05-14 LAB — BASIC METABOLIC PANEL
BUN: 17 mg/dL (ref 6–23)
BUN: 22 mg/dL (ref 6–23)
CO2: 27 mEq/L (ref 19–32)
Calcium: 9.2 mg/dL (ref 8.4–10.5)
Creatinine, Ser: 0.78 mg/dL (ref 0.4–1.2)
Creatinine, Ser: 0.8 mg/dL (ref 0.4–1.2)
GFR calc Af Amer: >60 mL/min (ref >60)
GFR calc non Af Amer: >60 mL/min (ref >60)

## 2010-05-14 LAB — POCT I-STAT, CHEM 8
BUN: 27 mg/dL — ABNORMAL HIGH (ref 6–23)
Calcium, Ion: 1.15 mmol/L (ref 1.12–1.32)
TCO2: 30 mmol/L (ref 0–100)

## 2010-05-14 LAB — LIPID PANEL
Cholesterol: 217 mg/dL — ABNORMAL HIGH (ref 0–200)
LDL Cholesterol: 138 mg/dL — ABNORMAL HIGH (ref 0–99)
VLDL: 25 mg/dL (ref 0–40)

## 2010-05-14 LAB — URINE MICROSCOPIC-ADD ON

## 2010-05-14 LAB — URINE CULTURE: Culture: NO GROWTH

## 2010-05-14 LAB — HEMOGLOBIN A1C: Hgb A1c MFr Bld: 7.8 % — ABNORMAL HIGH (ref 4.6–6.1)

## 2010-05-14 LAB — TROPONIN I: Troponin I: 0.05 ng/mL (ref 0.00–0.06)

## 2010-05-17 LAB — COMPREHENSIVE METABOLIC PANEL
ALT: 10 U/L (ref 0–35)
ALT: 10 U/L (ref 0–35)
AST: 49 U/L — ABNORMAL HIGH (ref 0–37)
AST: 12 U/L (ref 0–37)
AST: 15 U/L (ref 0–37)
Albumin: 3.7 g/dL (ref 3.5–5.2)
Albumin: 3.2 g/dL — ABNORMAL LOW (ref 3.5–5.2)
Albumin: 3.3 g/dL — ABNORMAL LOW (ref 3.5–5.2)
Albumin: 3.4 g/dL — ABNORMAL LOW (ref 3.5–5.2)
Alkaline Phosphatase: 74 U/L (ref 39–117)
Alkaline Phosphatase: 74 U/L (ref 39–117)
BUN: 22 mg/dL (ref 6–23)
BUN: 12 mg/dL (ref 6–23)
CO2: 29 mEq/L (ref 19–32)
Calcium: 9.3 mg/dL (ref 8.4–10.5)
Calcium: 9.1 mg/dL (ref 8.4–10.5)
Calcium: 9.4 mg/dL (ref 8.4–10.5)
Chloride: 100 mEq/L (ref 96–112)
Chloride: 107 mEq/L (ref 96–112)
Creatinine, Ser: 1.07 mg/dL (ref 0.4–1.2)
Creatinine, Ser: 0.87 mg/dL (ref 0.4–1.2)
GFR calc Af Amer: >60 mL/min (ref >60)
GFR calc Af Amer: >60 mL/min (ref >60)
GFR calc Af Amer: >60 mL/min (ref >60)
GFR calc Af Amer: >60 mL/min (ref >60)
GFR calc non Af Amer: 51 mL/min — ABNORMAL LOW (ref >60)
GFR calc non Af Amer: 52 mL/min — ABNORMAL LOW (ref >60)
GFR calc non Af Amer: >60 mL/min (ref >60)
Glucose, Bld: 299 mg/dL — ABNORMAL HIGH (ref 70–99)
Potassium: 3.3 mEq/L — ABNORMAL LOW (ref 3.5–5.1)
Potassium: 3.7 mEq/L (ref 3.5–5.1)
Sodium: 137 mEq/L (ref 135–145)
Sodium: 137 mEq/L (ref 135–145)
Total Bilirubin: 0.4 mg/dL (ref 0.3–1.2)
Total Bilirubin: 0.4 mg/dL (ref 0.3–1.2)
Total Protein: 6.8 g/dL (ref 6.0–8.3)

## 2010-05-17 LAB — HEMOGLOBIN A1C
Hgb A1c MFr Bld: 10.3 % — ABNORMAL HIGH (ref 4.6–6.1)
Mean Plasma Glucose: 249 mg/dL

## 2010-05-17 LAB — DIFFERENTIAL
Basophils Absolute: 0 10*3/uL (ref 0.0–0.1)
Basophils Absolute: 0 10*3/uL (ref 0.0–0.1)
Eosinophils Absolute: 0.1 10*3/uL (ref 0.0–0.7)
Eosinophils Relative: 0 % (ref 0–5)
Eosinophils Relative: 1 % (ref 0–5)
Lymphocytes Relative: 9 % — ABNORMAL LOW (ref 12–46)
Lymphocytes Relative: 42 % (ref 12–46)
Lymphs Abs: 1.2 10*3/uL (ref 0.7–4.0)
Monocytes Absolute: 0.7 10*3/uL (ref 0.1–1.0)
Monocytes Absolute: 0.4 10*3/uL (ref 0.1–1.0)
Neutro Abs: 10.5 10*3/uL — ABNORMAL HIGH (ref 1.7–7.7)

## 2010-05-17 LAB — CBC
HCT: 44 % (ref 36.0–46.0)
HCT: 40.9 % (ref 36.0–46.0)
Hemoglobin: 14 g/dL (ref 12.0–15.0)
MCHC: 33.2 g/dL (ref 30.0–36.0)
MCHC: 33.4 g/dL (ref 30.0–36.0)
MCHC: 33.9 g/dL (ref 30.0–36.0)
MCV: 80.8 fL (ref 78.0–100.0)
MCV: 80.6 fL (ref 78.0–100.0)
MCV: 81.6 fL (ref 78.0–100.0)
Platelets: 220 10*3/uL (ref 150–400)
Platelets: 213 10*3/uL (ref 150–400)
Platelets: 225 10*3/uL (ref 150–400)
RBC: 5.15 MIL/uL — ABNORMAL HIGH (ref 3.87–5.11)
RBC: 5.17 MIL/uL — ABNORMAL HIGH (ref 3.87–5.11)
RDW: 12.6 % (ref 11.5–15.5)
WBC: 5.7 10*3/uL (ref 4.0–10.5)
WBC: 5.9 10*3/uL (ref 4.0–10.5)

## 2010-05-17 LAB — RAPID URINE DRUG SCREEN, HOSP PERFORMED
Barbiturates: NOT DETECTED
Benzodiazepines: NOT DETECTED
Cocaine: POSITIVE — AB

## 2010-05-17 LAB — CARDIAC PANEL(CRET KIN+CKTOT+MB+TROPI)
CK, MB: 1.4 ng/mL (ref 0.3–4.0)
Relative Index: INVALID (ref 0.0–2.5)
Total CK: 39 U/L (ref 7–177)
Troponin I: 0.02 ng/mL (ref 0.00–0.06)
Troponin I: 0.02 ng/mL (ref 0.00–0.06)

## 2010-05-17 LAB — URINALYSIS, ROUTINE W REFLEX MICROSCOPIC
Bilirubin Urine: NEGATIVE
Ketones, ur: NEGATIVE mg/dL
Leukocytes, UA: NEGATIVE
Nitrite: NEGATIVE
Nitrite: NEGATIVE
Protein, ur: NEGATIVE mg/dL
Specific Gravity, Urine: 1.018 (ref 1.005–1.030)
Urobilinogen, UA: 1 mg/dL (ref 0.0–1.0)
Urobilinogen, UA: 1 mg/dL (ref 0.0–1.0)
pH: 5.5 (ref 5.0–8.0)

## 2010-05-17 LAB — BASIC METABOLIC PANEL
BUN: 15 mg/dL (ref 6–23)
CO2: 27 mEq/L (ref 19–32)
Glucose, Bld: 159 mg/dL — ABNORMAL HIGH (ref 70–99)
Potassium: 4 mEq/L (ref 3.5–5.1)
Sodium: 141 mEq/L (ref 135–145)

## 2010-05-17 LAB — GLUCOSE, CAPILLARY
Glucose-Capillary: 147 mg/dL — ABNORMAL HIGH (ref 70–99)
Glucose-Capillary: 151 mg/dL — ABNORMAL HIGH (ref 70–99)
Glucose-Capillary: 154 mg/dL — ABNORMAL HIGH (ref 70–99)
Glucose-Capillary: 191 mg/dL — ABNORMAL HIGH (ref 70–99)
Glucose-Capillary: 211 mg/dL — ABNORMAL HIGH (ref 70–99)
Glucose-Capillary: 219 mg/dL — ABNORMAL HIGH (ref 70–99)
Glucose-Capillary: 248 mg/dL — ABNORMAL HIGH (ref 70–99)
Glucose-Capillary: 295 mg/dL — ABNORMAL HIGH (ref 70–99)
Glucose-Capillary: 306 mg/dL — ABNORMAL HIGH (ref 70–99)
Glucose-Capillary: 70 mg/dL (ref 70–99)
Glucose-Capillary: 94 mg/dL (ref 70–99)

## 2010-05-17 LAB — LIPID PANEL
Cholesterol: 249 mg/dL — ABNORMAL HIGH (ref 0–200)
HDL: 50 mg/dL (ref >39)
HDL: 52 mg/dL (ref >39)
LDL Cholesterol: 179 mg/dL — ABNORMAL HIGH (ref 0–99)
Total CHOL/HDL Ratio: 5 RATIO
Total CHOL/HDL Ratio: 5.1 RATIO
Triglycerides: 171 mg/dL — ABNORMAL HIGH (ref <150)
VLDL: 34 mg/dL (ref 0–40)

## 2010-05-17 LAB — URINE CULTURE: Culture: NO GROWTH

## 2010-05-17 LAB — ETHANOL: Alcohol, Ethyl (B): <5 mg/dL (ref 0–10)

## 2010-05-17 LAB — TROPONIN I: Troponin I: 0.02 ng/mL (ref 0.00–0.06)

## 2010-05-17 LAB — POCT CARDIAC MARKERS
CKMB, poc: <1 ng/mL — ABNORMAL LOW (ref 1.0–8.0)
Myoglobin, poc: 51 ng/mL (ref 12–200)
Troponin i, poc: <0.05 ng/mL (ref 0.00–0.09)

## 2010-05-17 LAB — LIPASE, BLOOD: Lipase: 14 U/L (ref 11–59)

## 2010-05-17 LAB — BRAIN NATRIURETIC PEPTIDE: Pro B Natriuretic peptide (BNP): <30 pg/mL (ref 0.0–100.0)

## 2010-05-17 LAB — URINE MICROSCOPIC-ADD ON

## 2010-05-17 LAB — PROTIME-INR: Prothrombin Time: 14.2 seconds (ref 11.6–15.2)

## 2010-06-21 NOTE — Discharge Summary (Signed)
Michelle Lopez, Michelle Lopez NO.:  [id number]   MEDICAL RECORD NO.:  [id number]          PATIENT TYPE:  INP   LOCATION:  3002                         FACILITY:  [facility]   PHYSICIAN:  Alcide Evener, MD  DATE OF BIRTH:  1945-08-27   DATE OF ADMISSION:  09/23/2008  DATE OF DISCHARGE:  09/28/2008                               DISCHARGE SUMMARY   DISCHARGE DIAGNOSES:  1. Acute ischemic stroke.  2. Hypertension.  3. Hyperlipidemia.  4. Diabetes mellitus type 2.  5. Status post left hip replacement.   DISCHARGE MEDICATIONS:  1. Actos 15 mg p.o. daily.  2. Metformin 1000 mg p.o. b.i.d.  3. Glucotrol 10 mg p.o. daily.  4. HCTZ 12.5 mg p.o. daily.  5. Lotensin 1 mg p.o. daily.  6. Norvasc 10 mg p.o. daily.  7. Zocor 20 mg p.o. daily.  8. Clopidogrel 75 mg p.o. daily.   DISPOSITION AND FOLLOWUP:  Patient is discharged in stable condition.  Follow up with Michelle Lopez, PCP from [facility], phone [phone] on  October 15, 2008 at 2:15 p.m.  during this appointment, please recheck  hemoglobin A1C and fasting lipid panel and adjust those medications  accordingly.   PROCEDURES:  1. CT of the head without contrast.  Advanced small vessel ischemic      changes and an old left cerebellar infarct.  No acute intracranial      hemorrhage.  2. MRI/MRA of the brain showed the following.  A 4 mm acute infarct of      the right pons.  A 1.5 cm acute versus subacute infarct of the deep      white matter adjacent to the atrium of the right lateral ventricle.      Additionally, punctate foci of restriction in the thalamus that      could represent tiny acute infarct.  3. MRA of the Circle of Willis showed the following:  Atherosclerotic      changes, diffusely.  No acute large vessel occlusion.  4. Left hip film showed no acute process or fracture.  Echocardiogram      showed no evidence of cardioembolic source for stroke.   CONSULTATIONS:  None.   BRIEF ADMISSION HISTORY OF  PRESENT ILLNESS:  Michelle Lopez is a 65-year-  old African American female with past medical history of CVA x2 in 2007  and May 2010 with no residual deficits and also hypertension,  hyperlipidemia and type 2 diabetes who presented to [facility] Emergency  Department with a two day history of left sided weakness, dysarthria and  loss of balance.  The symptoms were first noticed by her rehab physical  therapy team on the day prior to admission.  These symptoms had remained  the same over the 48 hours with no improvement.  Nothing relieves or  exacerbates the symptoms.  She did fall one time onto her bed as a  result of poor balance.  She denies all of the following:  Loss of  sensation, tingling, syncope, palpations, chest pain, dyspnea, chest  congestion, cough and dysuria.   HOSPITAL COURSE:  1. Acute ischemic  stroke.  The patient presented with the above      symptoms and was found to be weak with 4/5 strength on the left      upper and left lower extremities.  Initial imaging of the head with      CT showed an ischemic stroke, not hemorrhagic, and this was      confirmed by MRI which showed specifically infarct in the locations      mentioned above.  This was her third stroke in the past three years      despite aspirin and statin therapy.  Therefore, we decided to      discontinue her aspirin in favor of clopidogrel started during the      hospitalization.  Risk stratification with hemoglobin A1C and      fasting lipid panel, both suggested that there were needs for      increased measures twoards secondary prevention.  During the      hospitalization, her neurologic function remained the same, and her      left sided weakness persisted.  She was able to work with physical      therapy and occupational therapy in a capacity that allowed her to      ambulate, feed herself and mostly be independent.  2. Hypertension.  Her home regimen on admission included Lotensin,      HCTZ and  amlodipine.  These medications were initially held in the      setting of an acute ischemic stroke allowing permissive      hypertension.  However, on discharge, we recommend that they all be      resumed.  3. Diabetes mellitus.  Hemoglobin A1C 7.8.  At home, she had been on      500 b.i.d. of Metformin, and it was decided that the dose of this      should be increased while the doses of Glucotrol and Actos were      kept the same as prior.  It would be important for Michelle Lopez to      follow up a repeat hemoglobin A1C and continue to adjust her      medications accordingly.  She may require insulin in the near      future for adequate control of her diabetes.  4. Hyperlipidemia.  Her fasting lipid panel showed an LDL of 138, and      despite treatment with Simvastatin 10 mg, thus her dose of      Simvastatin was increased to 20 mg during the hospitalization.  5. Status post left hip replacement.  The patient complained of slight      pain of the left hip on admission.  A film of that revealed no      acute fracture or other process, and thus, we monitored her hip      pain which gradually resolved during the hospitalization.   DISCHARGE LABORATORIES:  Discharge day labs include the following:  Sodium 137, potassium 3.5, chloride 101, bicarbonate 27, BUN 22,  creatinine 0.78, glucose 150.  CBC:  White count 7.0, hemoglobin 12.2,  hematocrit 36.8, platelets 265.  Fasting lipid panel, total cholesterol  217, triglyceride 123, HDL 58, LDL 138, hemoglobin A1C 7.8.      Michelle Ser, MD  Electronically Signed      Alcide Evener, MD  Electronically Signed    CW/MEDQ  D:  09/28/2008  T:  09/28/2008  Job:  [id number]   cc:   Michelle Lopez  [facility] Michelle Lopez

## 2010-06-21 NOTE — Discharge Summary (Signed)
NAMEJULEANA, COOKE               ACCOUNT NO.:  [id number]   MEDICAL RECORD NO.:  [id number]          PATIENT TYPE:  INP   LOCATION:  [id number]                         FACILITY:  [facility]   PHYSICIAN:  Cherene Altes, M.D.DATE OF BIRTH:  1945/08/18   DATE OF ADMISSION:  06/11/2008  DATE OF DISCHARGE:  06/15/2008                               DISCHARGE SUMMARY   PRIMARY CARE PHYSICIAN:  Unassigned - to follow up at [facility].   DISCHARGE DIAGNOSES:  1. Acute posterior circulation infarct on the left involving the      cerebellum and posterior membrane.      a.     Uncontrolled diabetes.      b.     Hypertension.      c.     Undiagnosed/untreated hypercholesterolemia.      d.     Cocaine abuse.      e.     No evidence of extracranial vascular disease.      f.     Normal echocardiogram.      g.     Aspirin therapy added to treatment.  2. Cocaine dependency/cocaine abuse - educated on connection to stroke      - counseled on resources for discontinuation.  3. Uncontrolled diabetes mellitus - medication therapy initiated.  4. Dyslipidemia - medication therapy initiated.  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   DISCHARGE MEDICATIONS:  1. Actos 15 mg p.o. daily.  2. Lotensin 10 mg p.o. daily.  3. Aspirin 325 mg p.o. daily.  4. Norvasc 10 mg p.o. daily.  5. Glucotrol 10 mg daily.  6. Glucophage 500 mg b.i.d.  7. Zocor 10 mg nightly.  8. Hydrochlorothiazide 12.5 mg daily   FOLLOW UP:  The patient is being set up for follow up at [facility].  She has no primary care physician at the present time.  She will follow  up at [facility] at the most readily available follow up appointment.  At that time, blood pressure and CBG should be rechecked.  LFTs should  be reassessed in approximately 1 month given the patient's new treatment  with Zocor.  BMET should also be assessed to assure the patient is  tolerating her ACE inhibitor therapy.   The patient should be assessed  for ongoing abstinence from cocaine.   CONSULTATIONS:  None.   PROCEDURES:  1. MRI of the brain, Jun 12, 2008 - acute posterior circulation infarct      on the left involving the cerebellum and posterior midbrain.  2. MRA of the head, Jun 12, 2008 - moderately severe diffuse      intracranial atherosclerosis without large vessel occlusion.  3. MRA of the neck Jun 12, 2008 - no significant carotid stenosis.  4. Transthoracic echocardiogram, Jun 12, 2008 - LV wall thickness      increase and pattern of severe left ventricular hypertrophy.      Systolic ejection fraction normal.  EF 55-60%.  Trivial aortic      regurg.  Left atrium mildly dilated.  5. Carotid  Doppler, Jun 12, 2008 - no significant extracranial carotid      artery stenosis demonstrated.   HOSPITAL COURSE:  Ms. Shamora Taunton is a pleasant 65 year old female who  presented to the hospital on Jun 11, 2008, with a 48-hour plus history of  slurred speech and questionable left-sided greater than right-sided  weakness.  On physical exam the patient did not appear to have focal  extremity weakness, but in fact was weak in general.  The patient  however, did exhibit significant slurred speech.  It was discovered at  the time of admission that the patient smokes cocaine on a daily basis.  Urine drug screen confirmed a positive cocaine test.  Full stroke  evaluation was carried out.  MRI and MRA of the head and neck were  carried out with results noted above.  Ultimately, a left posterior  circulation cerebellar and mid brain stroke were identified.  The  patient was also found to have uncontrolled hypertension, uncontrolled  diabetes mellitus and previously untreated hyperlipidemia.  All four of  these risk factors combined were addressed individually.  The patient  was counseled extensively as to the direct connection between cocaine  abuse and her acute stroke.  She was advised to abstain from cocaine   immediately.  Clinical social work consultation was carried out for  further counseling on available community resources to assist the  patient's abstinence from cocaine abuse.  Medication adjustment was  carried out to control the patient's diabetes.  CBG was significantly  improved at time of the patient's discharge.  Blood pressure medications  were titrated to assure appropriate control of the patient's blood  pressure, but the patient's blood pressure was not acutely lowered for  the initial 48 hours of the patient's hospital stay.  LFTs were  confirmed to be normal and Zocor was initiated for treatment of the  patient's hyperlipidemia.  The patient was educated on appropriate  diabetic diet.  The patient was scheduled for outpatient diabetes  education.  With completion of the patient's stroke workup to include  carotid Dopplers and echocardiogram no further inpatient hospitalization  was felt to be indicated.  On physical exam, however, the patient did  exhibit significant cognitive deficit.  This was further supported by  sessions with physical and occupational therapy.  This was felt to be  secondary to vascular mediated dementia as appreciated on MRI scan.  As  a result, it was advised that the patient not live independently.  Fortunately, the patient has multiple family members who were involved  in her care.  At the time of discharge, the patient is to  move in with  her daughter who will be able to supervise her.  On Jun 15, 2008, the  patient is deemed to be stable for discharge.  She is ambulating well  with no focal significant difficulties other than diffuse generalized  weakness.  Home health speech therapy will continue to work with the  patient on her slurred speech.  No further home health physical therapy  or occupational therapy needs are identified per our evaluation during  the hospital stay.      Cherene Altes, M.D.  Electronically Signed      JTM/MEDQ  D:  06/15/2008  T:  06/15/2008  Job:  [id number]

## 2010-06-24 NOTE — H&P (Signed)
NAME:  Michelle Lopez, Michelle Lopez               ACCOUNT NO.:  [id number]   MEDICAL RECORD NO.:  [id number]          PATIENT TYPE:  EMS   LOCATION:  MAJO                         FACILITY:  [facility]   PHYSICIAN:  Corinna L. Conley Canal, MDDATE OF BIRTH:  1945-10-02   DATE OF ADMISSION:  08/20/2005  DATE OF DISCHARGE:                                HISTORY & PHYSICAL   CHIEF COMPLAINT:  Slurred speech.   HISTORY OF PRESENT ILLNESS:  Michelle Lopez is an unassigned 65 year old black  female who presents to the emergency room with slurred speech, weakness and  confusion earlier today.  This occurred at 2 p.m., but she did not come to  the emergency room until 11:00.  She has a history of hypertension and  diabetes but has not taken her medications in half a year.  She usually goes  to [facility].  Her daughter reports that she seemed to be struggling to  get up off the floor where she was watching TV.  She also had slurred  speech.  This is resolved now.   PAST MEDICAL HISTORY:  1.  Hypertension.  2.  Diabetes.   MEDICATIONS:  None.   ALLERGIES:  None.   SOCIAL HISTORY:  She does not work.  She does not smoke.  She drinks  occasionally.  She has abused cocaine and marijuana in the past.   FAMILY HISTORY:  Her brother had a stroke.  Her mother had Alzheimer's  disease.   PAST SURGICAL HISTORY:  She has had a total hip arthroplasty.   REVIEW OF SYSTEMS:  As above.  Otherwise negative.   PHYSICAL EXAMINATION:  VITAL SIGNS:  Blood pressure initially 267/121, pulse  78, respiratory rate 20, oxygen saturation 99% on room air.  GENERAL:  The patient is a black female in no acute distress.  HEENT:  Normocephalic, atraumatic.  Pupils are equal, round and reactive to  light.  Sclerae are nonicteric.  Moist mucous membranes.  NECK:  Supple.  No carotid bruits.  No thyromegaly.  LUNGS:  Clear to auscultation bilaterally without wheezes, rhonchi or rales.  CARDIOVASCULAR:  Regular rate and rhythm without  murmurs, gallops or rubs.  ABDOMEN:  Normal bowel sounds.  Soft, nontender and nondistended.  GENITOURINARY:  Deferred.  RECTAL:  Deferred.  EXTREMITIES:  No clubbing, cyanosis or edema.  PSYCHIATRIC:  Normal affect.  NEUROLOGIC:  She is alert and oriented x3.  Cranial nerves are intact.  Motor strength is 5/5 throughout.  Deep tendon reflexes 2+ and equal.  Babinski's negative.  I did not test gait.  Finger-to-nose is normal.  SKIN:  No rash.   LABORATORY:  CBC is unremarkable.  PT/PTT normal.  Basic metabolic panel  significant for a glucose of 270, otherwise unremarkable.  EKG showed normal  sinus rhythm with LVH by voltage and strain versus ischemia laterally.  CT  of the brain shows extensive white matter disease, making it difficult to  rule out underlying infarct.   ASSESSMENT/PLAN:  1.  Hypertensive urgency.  She has received 20 mg of IV labetalol, and her      blood pressure is  198/96.  I will admit her to the stepdown unit and      continue p.r.n. labetalol.  I will also start metoprolol and      hydrochlorothiazide and give p.r.n. clonidine.  2.  Transient ischemic attack versus hypertensive encephalopathy.  I will      check a MRI/MRA, carotid Dopplers and echocardiogram, start an aspirin a      day and check fasting lipids and homocysteine level.  She will get neuro      checks.  Her symptoms are nearly resolved currently.  3.  Uncontrolled diabetes.  I will start Amaryl and give sliding-scale      insulin.  4.  History of drug abuse.  I will check a urine drug screen.  5.  Noncompliance.      Corinna L. Conley Canal, MD  Electronically Signed     CLS/MEDQ  D:  08/20/2005  T:  08/20/2005  Job:  [id number]

## 2010-06-24 NOTE — Discharge Summary (Signed)
NAMEWENDY, Michelle Lopez               ACCOUNT NO.:  [id number]   MEDICAL RECORD NO.:  [id number]          PATIENT TYPE:  INP   LOCATION:  3041                         FACILITY:  [facility]   PHYSICIAN:  Corinna L. Conley Canal, MDDATE OF BIRTH:  February 21, 1945   DATE OF ADMISSION:  08/19/2005  DATE OF DISCHARGE:  08/24/2005                                 DISCHARGE SUMMARY   DISCHARGE DIAGNOSES:  1. Hypertensive urgency.  2. Cerebrovascular infarct.  3. Diabetes, controlled.  4. Cocaine abuse.  5. Lipidemia  6. Diffuse cerebral vascular disease without treatable lesion.  7. Noncompliance.   DISCHARGE MEDICATIONS:  1. Aspirin 325 mg a day.  2. Zocor 20 mg a day.  3. Amaryl 8 mg in the morning.  4. Metformin 500 mg p.o. daily for 7 days and b.i.d. for 7 days, then 1000      mg p.o. b.i.d.  5. Hydrochlorothiazide 25 mg a day.  6. Labetalol 200 mg p.o. b.i.d.   FOLLOW UP:  With [facility] as soon as possible.   CONSULTATIONS:  CVTS, Dr. Estella Husk, Dr. Estanislado Pandy.   PROCEDURES:  Cerebral angiogram which showed diffuse focal areas of  narrowing involving the posterior and anterior circulation interspersed with  areas of normal caliber, probably reflecting arteriosclerotic changes  intracranially; vasculitis have a similar appearance whether inflammatory or  chemical in nature, 50% stenosis of the right internal carotid artery and  left internal carotid artery, proximal probable arteriosclerotic changes in  the left vertebral artery with narrowing of approximately 30%.   DIET:  His diet should be diabetic, low salt.   ACTIVITY:  Ad lib.   PERTINENT LABORATORY DATA:  CBC unremarkable.  PT/PTT normal. Complete  metabolic panel significant for a glucose of 270, otherwise unremarkable.  Homocystine 9, hemoglobin A1c 11.9.  Cardiac enzymes negative.  LDL 166, HDL  150, triglycerides 144 and drug screen positive for cocaine. EKG showed  normal sinus rhythm and LVH by voltage with strain versus  ischemia  laterally.  Echocardiogram showed ejection fraction of 60%, mildly increased  aortic valve thickness, trivial aortic valvular regurgitation, mild mitral  valvular regurgitation.  Carotid Dopplers showed mild soft plaque of the  common carotid artery and origin of the internal carotid artery, severe soft  plaque versus thrombus through the PCA and origin of the ICA on the left,  antegrade vertebral arteries. CT of the brain without contrast showed  extensive periventricular white matter, hypodensity consistent with small  vessel ischemic disease, nothing acute.  MRI of the brain showed moderate  size acute ischemic infarct involving the left paramedian pons, subacute  infarct evolving the right paramedian pons with 2 old lacunar infarcts,  ischemic infarct in the left caudate head and posterior aspect of the right  thalamus. MRA showed scattered areas of probable arteriosclerotic narrowing  involving the middle cerebral artery distribution, left posterior cerebral  artery distribution and cavernous carotid; no aneurysm; suspicion of  approximately 50% stenosis of the left vertebral basilar junction; probable  50% stenosis of the left vertebral basilar junction with less than 50%  stenosis of the right vertebral basilar junction.  HISTORY AND HOSPITAL COURSE:  Michelle Lopez is a 65 year old unassigned black  female with a history of hypertension, diabetes and cocaine abuse who  presented to the emergency room a day after her daughter noted she had  slurred speech.  She also was weak and confused.  Her blood pressure in the  emergency room was found to be 267/121.  She had a nonfocal exam and clear  speech.  She was admitted to the step-down unit for blood pressure control.  It was felt that she may have had a TIA versus hypertensive encephalopathy.  The MRI scan, however, did show several areas of infarct as well as  cerebrovascular disease.  Neurology was consulted and recommended  a cerebral  angiogram which was done.  There was diffuse cerebral vascular disease but  no treatable lesion.  Cardiovascular thoracic surgery also was consulted and  signed off.  The patient's blood pressure improved.  She was started on  medication for her diabetes, and this was better controlled.  She was  encouraged to abstain from drugs and follow up with [facility] as soon as  possible, also to take her medications as prescribed.      Corinna L. Conley Canal, MD  Electronically Signed     CLS/MEDQ  D:  09/10/2005  T:  09/11/2005  Job:  [id number]

## 2010-06-24 NOTE — Consult Note (Signed)
Michelle Lopez, Michelle Lopez               ACCOUNT NO.:  [id number]   MEDICAL RECORD NO.:  [id number]          PATIENT TYPE:  INP   LOCATION:  3041                         FACILITY:  [facility]   PHYSICIAN:  Shaune Pascal. Champey, M.D.DATE OF BIRTH:  12/08/1945   DATE OF CONSULTATION:  DATE OF DISCHARGE:                                   CONSULTATION   REASON FOR CONSULTATION:  Stroke.   HISTORY OF PRESENT ILLNESS:  Michelle Lopez is a 65 year old African American  female with past medical history of hypertension, diabetes (who stopped her  medication 6 months ago) who initially presented on August 20, 2005 with  facial droop and dysarthric speech.  Patient states that, last Friday, she  felt generalized weakness and fatigue.  The next day, they noticed that  patient had some facial droop, greater on the left than right, and had some  dysarthric speech.  Her speech has improved since the onset.  Patient has  also been having some difficulty swallowing.  She denies any focal weakness,  numbness, vision changes, vertigo, dizziness, falls , or loss of  consciousness.   PAST MEDICAL HISTORY:  Positive for hypertension, diabetes, and status post  left hip replacement.   CURRENT MEDICATIONS:  None prior to admission.  Currently, she is on Amaryl,  hydrochlorothiazide, aspirin, and labetalol.   ALLERGIES:  PATIENT HAS NO KNOWN DRUG ALLERGIES.   FAMILY HISTORY:  Positive for hypertension, diabetes, and heart disease.   SOCIAL HISTORY:  Patient lives alone.  Denies any smoking.  Patient socially  drinks alcohol.  Patient does have a history of drug use with marijuana and  cocaine and states that her last cocaine use was 1 month ago.   REVIEW OF SYSTEMS:  Positive as per history of present illness and also for  hot flashes and depression.  Review of systems negative as per history of  present illness and great than other 7 systems.   PHYSICAL EXAMINATION:  VITAL SIGNS:  Temperature 97.2, pulse 62,  respirations 20, blood pressure 131/64.  HEENT:  Normocephalic, atraumatic.  Extraocular muscles are intact.  Pupils  are equal, round, and reactive to light.  NECK:  Supple.  No carotid bruits are heard.  HEART:  Regular.  LUNGS:  Clear.  ABDOMEN:  Soft, nontender.  EXTREMITIES:  Show no edema with good pulses.  NEUROLOGIC:  Patient is awake, alert, and oriented x3.  Language is  dysarthric.  Patient does not have any aphagia.  Memory is within normal  limits.  Cranial nerves, patient might have some slight facial asymmetry.  However, rest of cranial nerves II-XII are grossly intact.  MOTOR:  Shows 4+/5 out of 5 strength.  Throughout, the patient does have 4  out of 5 strength in the left proximal upper extremity secondary to old,  left hip surgery.  Patient does have normal tone.  No drift is noted.  SENSORY:  Patient has slightly decreased sensation to pinprick on the left,  greater in the upper extremity than the lower extremity.  Reflexes are 1+ to  2+ in the upper extremities bilaterally and trace in  the lower extremities  bilaterally.  Toes are neutral bilaterally.  Cerebellar function is within  normal limits.  Finger-to-nose, heel-to-shin, gait is steady.   LABORATORY DATA:  Sodium is 139, potassium 3.7, chloride 101 CO2 31, BUN 14,  creatinine 0.8, glucose 285.  Urine drug screen was positive for cocaine.  Homocystine level is 9.  Cholesterol is 245, triglycerides 144, LDL 166.  2-  D echo showed an EF of 60%.  Carotid Doppler showed left ICA plaque.  MRI,  MRA showed moderate size infarct in the left pons, which is acute and  subacute right infarct with old lacunar infarcts in the pons.  Patient has  an old, left caudate ischemic infarct and small vessel ischemia.  Patient,  on MRA, had scattered arteriosclerosis in the MCA distribution, left ICA,  and carotids.  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  Patient has been on medications in the past and I agree with  starting patient on new regimen and starting aspirin and Zocor.  I have  reviewed the MRI and MRA, concerned for posterior circulation stenosis in  the left vertebral artery causing her strokes in the pons, both in new and  old strokes.  I have discussed the case with Dr. Estanislado Pandy, who read her  MRI.  I believe a cerebral angiogram is warranted for ideal evaluation of  the vasculature and stenosis.  Patient will also need drug counseling  education given her positive cocaine history.  Keep the patient n.p.o. until  she passes swallow evaluation by bedside nurse evaluations.  Check  hemoglobin A1c.  Get speech consult as well.  I have discussed the case and  plan with Dr. Dillard Essex, who agrees with the plan.  Will follow the patient on  our stroke consult service.      Shaune Pascal. Estella Husk, M.D.  Electronically Signed     DRC/MEDQ  D:  08/22/2005  T:  08/23/2005  Job:  [id number]

## 2010-06-30 ENCOUNTER — Telehealth: Payer: Self-pay | Admitting: *Deleted

## 2010-06-30 MED ORDER — SULFACETAMIDE SODIUM 10 % OP SOLN: 1.0000 [drp] | Freq: Three times a day (TID) | OPHTHALMIC | Status: AC

## 2010-06-30 NOTE — Telephone Encounter (Signed)
Rx Done . 

## 2010-08-02 ENCOUNTER — Telehealth: Payer: Self-pay | Admitting: *Deleted

## 2010-08-02 MED ORDER — AMLODIPINE BESYLATE 10 MG PO TABS: 10.0000 mg | ORAL_TABLET | Freq: Every day | ORAL | Status: DC

## 2010-08-02 MED ORDER — TAMSULOSIN HCL 0.4 MG PO CAPS: 0.4000 mg | ORAL_CAPSULE | Freq: Every day | ORAL | Status: DC

## 2010-08-02 MED ORDER — HYDROCHLOROTHIAZIDE 12.5 MG PO CAPS: 12.5000 mg | ORAL_CAPSULE | Freq: Every day | ORAL | Status: DC

## 2010-08-02 MED ORDER — GLUCOSE BLOOD VI STRP: ORAL_STRIP | Status: DC

## 2010-08-02 MED ORDER — BENAZEPRIL HCL 10 MG PO TABS: 10.0000 mg | ORAL_TABLET | Freq: Every day | ORAL | Status: DC

## 2010-08-02 MED ORDER — PRODIGY LANCETS 28G MISC: Status: DC

## 2010-08-02 MED ORDER — METFORMIN HCL 1000 MG PO TABS: 1000.0000 mg | ORAL_TABLET | Freq: Two times a day (BID) | ORAL | Status: DC

## 2010-08-02 MED ORDER — CLOPIDOGREL BISULFATE 75 MG PO TABS: 75.0000 mg | ORAL_TABLET | Freq: Every day | ORAL | Status: DC

## 2010-08-02 NOTE — Telephone Encounter (Signed)
Rfs needed, fax received from pharm

## 2010-08-05 ENCOUNTER — Other Ambulatory Visit: Payer: Self-pay | Admitting: Internal Medicine

## 2010-09-01 ENCOUNTER — Telehealth: Payer: Self-pay

## 2010-09-01 NOTE — Telephone Encounter (Signed)
Pharmacy notified.

## 2010-09-01 NOTE — Telephone Encounter (Signed)
She should not need this anymore, if she does then she needs to see her eye doctor

## 2010-09-01 NOTE — Telephone Encounter (Signed)
Received fax from pharmacy Needing to confirm continued need for neomycin and sulfacetamide. Please advise

## 2010-09-14 ENCOUNTER — Ambulatory Visit: Payer: Medicaid Other | Admitting: Internal Medicine

## 2010-09-15 ENCOUNTER — Other Ambulatory Visit: Payer: Self-pay | Admitting: Internal Medicine

## 2010-09-15 ENCOUNTER — Encounter: Payer: Self-pay | Admitting: Internal Medicine

## 2010-09-15 ENCOUNTER — Ambulatory Visit (INDEPENDENT_AMBULATORY_CARE_PROVIDER_SITE_OTHER): Payer: Medicare Other | Admitting: Internal Medicine

## 2010-09-15 ENCOUNTER — Other Ambulatory Visit (INDEPENDENT_AMBULATORY_CARE_PROVIDER_SITE_OTHER): Payer: Medicare Other

## 2010-09-15 DIAGNOSIS — I10 Essential (primary) hypertension: Secondary | ICD-10-CM

## 2010-09-15 DIAGNOSIS — Z1231 Encounter for screening mammogram for malignant neoplasm of breast: Secondary | ICD-10-CM | POA: Insufficient documentation

## 2010-09-15 DIAGNOSIS — E785 Hyperlipidemia, unspecified: Secondary | ICD-10-CM

## 2010-09-15 DIAGNOSIS — Z8679 Personal history of other diseases of the circulatory system: Secondary | ICD-10-CM

## 2010-09-15 LAB — COMPREHENSIVE METABOLIC PANEL
ALT: 9 U/L (ref 0–35)
Alkaline Phosphatase: 63 U/L (ref 39–117)
Creatinine, Ser: 1.3 mg/dL — ABNORMAL HIGH (ref 0.4–1.2)
Sodium: 137 mEq/L (ref 135–145)
Total Bilirubin: 0.3 mg/dL (ref 0.3–1.2)
Total Protein: 7.8 g/dL (ref 6.0–8.3)

## 2010-09-15 LAB — CBC WITH DIFFERENTIAL/PLATELET
Basophils Absolute: 0 10*3/uL (ref 0.0–0.1)
Eosinophils Absolute: 0 10*3/uL (ref 0.0–0.7)
HCT: 37.2 % (ref 36.0–46.0)
Lymphs Abs: 2.2 10*3/uL (ref 0.7–4.0)
MCHC: 32.1 g/dL (ref 30.0–36.0)
MCV: 81.3 fl (ref 78.0–100.0)
Monocytes Absolute: 0.5 10*3/uL (ref 0.1–1.0)
Neutro Abs: 3.2 10*3/uL (ref 1.4–7.7)
Platelets: 336 10*3/uL (ref 150.0–400.0)
RDW: 13.8 % (ref 11.5–14.6)

## 2010-09-15 LAB — LIPID PANEL
Cholesterol: 258 mg/dL — ABNORMAL HIGH (ref 0–200)
Total CHOL/HDL Ratio: 6
Triglycerides: 194 mg/dL — ABNORMAL HIGH (ref 0.0–149.0)

## 2010-09-15 LAB — LDL CHOLESTEROL, DIRECT: Direct LDL: 200.2 mg/dL

## 2010-09-15 MED ORDER — ROSUVASTATIN CALCIUM 10 MG PO TABS: 10.0000 mg | ORAL_TABLET | Freq: Every day | ORAL | Status: DC

## 2010-09-15 NOTE — Assessment & Plan Note (Signed)
Start crestor 

## 2010-09-15 NOTE — Assessment & Plan Note (Signed)
She is at her baseline wrt neuro deficits, I see no evidence of a new event

## 2010-09-15 NOTE — Progress Notes (Signed)
Subjective:    Patient ID: Michelle Lopez, female    DOB: 1945-11-26, 65 y.o.   MRN: [id number]  Hypertension This is a chronic problem. The current episode started more than 1 year ago. The problem has been gradually improving since onset. The problem is controlled. Pertinent negatives include no anxiety, blurred vision, chest pain, headaches, malaise/fatigue, neck pain, orthopnea, palpitations, peripheral edema, PND, shortness of breath or sweats. There are no associated agents to hypertension. Past treatments include calcium channel blockers and ACE inhibitors. The current treatment provides significant improvement. Compliance problems include medication side effects (dizziness).  Hypertensive end-organ damage includes CVA.  Diabetes She presents for her follow-up diabetic visit. She has type 2 diabetes mellitus. Her disease course has been stable. Hypoglycemia symptoms include dizziness and sleepiness. Pertinent negatives for hypoglycemia include no confusion, headaches, hunger, mood changes, nervousness/anxiousness, pallor, seizures, speech difficulty, sweats or tremors. Associated symptoms include weakness ("all over"). Pertinent negatives for diabetes include no blurred vision, no chest pain, no fatigue, no foot paresthesias, no foot ulcerations, no polydipsia, no polyphagia, no polyuria, no visual change and no weight loss. There are no hypoglycemic complications. Symptoms are stable. Diabetic complications include a CVA. Current diabetic treatment includes oral agent (dual therapy). She is compliant with treatment all of the time. Her weight is stable. She is following a generally healthy diet. Meal planning includes avoidance of concentrated sweets. She has not had a previous visit with a dietician. She never participates in exercise. There is no change in her home blood glucose trend. Her breakfast blood glucose range is generally 70-90 mg/dl. Her lunch blood glucose range is generally 90-110 mg/dl.  Her dinner blood glucose range is generally 90-110 mg/dl. Her highest blood glucose is >200 mg/dl. Her overall blood glucose range is 90-110 mg/dl. An ACE inhibitor/angiotensin II receptor blocker is being taken. She sees a podiatrist.Eye exam is current.      Review of Systems  Constitutional: Negative for fever, chills, weight loss, malaise/fatigue, diaphoresis, activity change, appetite change, fatigue and unexpected weight change.  HENT: Negative for sore throat, facial swelling, trouble swallowing, neck pain, neck stiffness and voice change.   Eyes: Negative for blurred vision, photophobia, redness and visual disturbance.  Respiratory: Negative for apnea, cough, choking, chest tightness, shortness of breath, wheezing and stridor.   Cardiovascular: Negative for chest pain, palpitations, orthopnea, leg swelling and PND.  Gastrointestinal: Negative for nausea, vomiting, abdominal pain, diarrhea, constipation and blood in stool.  Genitourinary: Negative for dysuria, urgency, polyuria, frequency, flank pain, decreased urine volume, enuresis and difficulty urinating.  Musculoskeletal: Negative for myalgias, back pain, joint swelling, arthralgias and gait problem.  Skin: Negative for color change, pallor, rash and wound.  Neurological: Positive for dizziness and weakness ("all over"). Negative for tremors, seizures, syncope, facial asymmetry, speech difficulty, light-headedness, numbness and headaches.  Hematological: Negative for polydipsia, polyphagia and adenopathy. Does not bruise/bleed easily.  Psychiatric/Behavioral: Negative for suicidal ideas, hallucinations, behavioral problems, confusion, sleep disturbance, self-injury, dysphoric mood, decreased concentration and agitation. The patient is not nervous/anxious and is not hyperactive.        Objective:   Physical Exam  Vitals reviewed. Constitutional: She is oriented to person, place, and time. She appears well-developed and  well-nourished. No distress.  HENT:  Head: Normocephalic and atraumatic.  Mouth/Throat: Oropharynx is clear and moist. No oropharyngeal exudate.  Eyes: Conjunctivae and EOM are normal. Pupils are equal, round, and reactive to light. Right eye exhibits no discharge. Left eye exhibits no discharge. No scleral icterus.  Neck: Normal range of motion. Neck supple. No JVD present. No tracheal deviation present. No thyromegaly present.  Cardiovascular: Normal rate, regular rhythm, normal heart sounds and intact distal pulses.  Exam reveals no gallop and no friction rub.   No murmur heard. Pulmonary/Chest: Effort normal and breath sounds normal. No stridor. No respiratory distress. She has no wheezes. She has no rales. She exhibits no tenderness.  Abdominal: Soft. Bowel sounds are normal. She exhibits no distension and no mass. There is no tenderness. There is no rebound and no guarding.  Musculoskeletal: Normal range of motion. She exhibits no edema and no tenderness.  Lymphadenopathy:    She has no cervical adenopathy.  Neurological: She is alert and oriented to person, place, and time. She displays no atrophy, no tremor and normal reflexes. No cranial nerve deficit or sensory deficit. She exhibits normal muscle tone. She displays no seizure activity. Coordination (mild ataxia, uses a cane walker) and gait abnormal. She displays no Babinski's sign on the right side. She displays no Babinski's sign on the left side.  Reflex Scores:      Tricep reflexes are 1+ on the right side and 1+ on the left side.      Bicep reflexes are 1+ on the right side and 1+ on the left side.      Brachioradialis reflexes are 1+ on the right side and 1+ on the left side.      Patellar reflexes are 1+ on the right side and 1+ on the left side.      Achilles reflexes are 1+ on the right side and 1+ on the left side. Skin: Skin is warm and dry. No rash noted. She is not diaphoretic. No erythema. No pallor.  Psychiatric: She has  a normal mood and affect. Her behavior is normal. Judgment and thought content normal.          Assessment & Plan:

## 2010-09-15 NOTE — Patient Instructions (Signed)
Diabetes, Type 2 Diabetes is a lasting (chronic) disease. In type 2 diabetes, the pancreas does not make enough insulin (a hormone), and the body does not respond normally to the insulin that is made. This type of diabetes was also previously called adult onset diabetes. About 90% of all those who have diabetes have type 2. It usually occurs after the age of 40 but can occur at any age. CAUSES Unlike type 1 diabetes, which happens because insulin is no longer being made, type 2 diabetes happens because the body is making less insulin and has trouble using the insulin properly. SYMPTOMS  Drinking more than usual.   Urinating more than usual.   Blurred vision.   Dry, itchy skin.   Frequent infection like yeast infections in women.   More tired than usual (fatigue).  TREATMENT  Healthy eating.   Exercise.   Medication, if needed.   Monitoring blood glucose (sugar).   Seeing your caregiver regularly.  HOME CARE INSTRUCTIONS  Check your blood glucose (sugar) at least once daily. More frequent monitoring may be necessary, depending on your medications and on how well your diabetes is controlled. Your caregiver will advise you.   Take your medicine as directed by your caregiver.   Do not smoke.   Make wise food choices. Ask your caregiver for information. Weight loss can improve your diabetes.   Learn about low blood glucose (hypoglycemia) and how to treat it.   Get your eyes checked regularly.   Have a yearly physical exam. Have your blood pressure checked. Get your blood and urine tested.   Wear a pendant or bracelet saying that you have diabetes.   Check your feet every night for sores. Let your caregiver know if you have sores that are not healing.  SEEK MEDICAL CARE IF:  You are having problems keeping your blood glucose at target range.   You feel you might be having problems with your medicines.   You have symptoms of an illness that is not improving after 24  hours.   You have a sore or wound that is not healing.   You notice a change in vision or a new problem with your vision.   You develop a fever of more than 100.5.  Document Released: 01/23/2005 Document Re-Released: 02/14/2009 [employer] Patient Information 2011 [employer].Hypertension (High Blood Pressure) As your heart beats, it forces blood through your arteries. This force is your blood pressure. If the pressure is too high, it is called hypertension (HTN) or high blood pressure. HTN is dangerous because you may have it and not know it. High blood pressure may mean that your heart has to work harder to pump blood. Your arteries may be narrow or stiff. The extra work puts you at risk for heart disease, stroke, and other problems.  Blood pressure consists of two numbers, a higher number over a lower, 110/72, for example. It is stated as "110 over 72." The ideal is below 120 for the top number (systolic) and under 80 for the bottom (diastolic). Write down your blood pressure today. You should pay close attention to your blood pressure if you have certain conditions such as:  Heart failure.  Prior heart attack.   Diabetes   Chronic kidney disease.   Prior stroke.   Multiple risk factors for heart disease.   To see if you have HTN, your blood pressure should be measured while you are seated with your arm held at the level of the heart. It   should be measured at least twice. A one-time elevated blood pressure reading (especially in the Emergency Department) does not mean that you need treatment. There may be conditions in which the blood pressure is different between your right and left arms. It is important to see your caregiver soon for a recheck. Most people have essential hypertension which means that there is not a specific cause. This type of high blood pressure may be lowered by changing lifestyle factors such as:  Stress.  Smoking.   Lack of exercise.   Excessive  weight.  Drug/tobacco/alcohol use.   Eating less salt.   Most people do not have symptoms from high blood pressure until it has caused damage to the body. Effective treatment can often prevent, delay or reduce that damage. TREATMENT Treatment for high blood pressure, when a cause has been identified, is directed at the cause. There are a large number of medications to treat HTN. These fall into several categories, and your caregiver will help you select the medicines that are best for you. Medications may have side effects. You should review side effects with your caregiver. If your blood pressure stays high after you have made lifestyle changes or started on medicines,   Your medication(s) may need to be changed.   Other problems may need to be addressed.   Be certain you understand your prescriptions, and know how and when to take your medicine.   Be sure to follow up with your caregiver within the time frame advised (usually within two weeks) to have your blood pressure rechecked and to review your medications.   If you are taking more than one medicine to lower your blood pressure, make sure you know how and at what times they should be taken. Taking two medicines at the same time can result in blood pressure that is too low.  SEEK IMMEDIATE MEDICAL CARE IF YOU DEVELOP:  A severe headache, blurred or changing vision, or confusion.   Unusual weakness or numbness, or a faint feeling.   Severe chest or abdominal pain, vomiting, or breathing problems.  MAKE SURE YOU:   Understand these instructions.   Will watch your condition.   Will get help right away if you are not doing well or get worse.  Document Released: 01/23/2005 Document Re-Released: 07/13/2009 [employer] Patient Information 2011 [employer]. 

## 2010-09-15 NOTE — Assessment & Plan Note (Signed)
Her BP is a little low and I think this is causing dizziness so I have asked her to stop taking amlodidpine

## 2010-09-15 NOTE — Assessment & Plan Note (Signed)
It sounds like her BS has been well controlled, I will check her A1C and monitor her renal function today

## 2010-10-21 ENCOUNTER — Ambulatory Visit: Payer: Medicare Other

## 2010-10-21 ENCOUNTER — Ambulatory Visit
Admission: RE | Admit: 2010-10-21 | Discharge: 2010-10-21 | Disposition: A | Discharge status: Home / Self Care | Payer: Medicare Other | Source: Ambulatory Visit | Attending: Internal Medicine | Admitting: Internal Medicine

## 2010-10-21 DIAGNOSIS — Z1231 Encounter for screening mammogram for malignant neoplasm of breast: Secondary | ICD-10-CM

## 2010-10-28 ENCOUNTER — Other Ambulatory Visit: Payer: Self-pay | Admitting: Internal Medicine

## 2010-10-28 DIAGNOSIS — R928 Other abnormal and inconclusive findings on diagnostic imaging of breast: Secondary | ICD-10-CM

## 2010-10-31 ENCOUNTER — Other Ambulatory Visit: Payer: Self-pay | Admitting: Internal Medicine

## 2010-11-09 ENCOUNTER — Other Ambulatory Visit: Payer: Medicare Other

## 2010-11-21 ENCOUNTER — Other Ambulatory Visit: Payer: Medicare Other

## 2010-12-07 ENCOUNTER — Other Ambulatory Visit (INDEPENDENT_AMBULATORY_CARE_PROVIDER_SITE_OTHER): Payer: Medicare Other

## 2010-12-07 ENCOUNTER — Encounter: Payer: Self-pay | Admitting: Internal Medicine

## 2010-12-07 ENCOUNTER — Ambulatory Visit (INDEPENDENT_AMBULATORY_CARE_PROVIDER_SITE_OTHER): Payer: Medicare Other | Admitting: Internal Medicine

## 2010-12-07 DIAGNOSIS — I10 Essential (primary) hypertension: Secondary | ICD-10-CM

## 2010-12-07 DIAGNOSIS — E785 Hyperlipidemia, unspecified: Secondary | ICD-10-CM

## 2010-12-07 LAB — BASIC METABOLIC PANEL
CO2: 27 mEq/L (ref 19–32)
Calcium: 9.4 mg/dL (ref 8.4–10.5)
Chloride: 104 mEq/L (ref 96–112)
Glucose, Bld: 144 mg/dL — ABNORMAL HIGH (ref 70–99)
Potassium: 4.6 mEq/L (ref 3.5–5.1)
Sodium: 140 mEq/L (ref 135–145)

## 2010-12-07 LAB — HEMOGLOBIN A1C: Hgb A1c MFr Bld: 7.1 % — ABNORMAL HIGH (ref 4.6–6.5)

## 2010-12-07 MED ORDER — ROSUVASTATIN CALCIUM 20 MG PO TABS: 20.0000 mg | ORAL_TABLET | Freq: Every day | ORAL | Status: DC

## 2010-12-07 NOTE — Patient Instructions (Signed)

## 2010-12-07 NOTE — Progress Notes (Signed)
Subjective:    Patient ID: Michelle Lopez, female    DOB: 09/27/1945, 65 y.o.   MRN: [id number]  Hypertension This is a chronic problem. The current episode started more than 1 year ago. The problem has been gradually improving since onset. The problem is controlled. Pertinent negatives include no anxiety, blurred vision, chest pain, headaches, malaise/fatigue, neck pain, orthopnea, palpitations, peripheral edema, PND, shortness of breath or sweats. There are no associated agents to hypertension. Past treatments include ACE inhibitors and diuretics. The current treatment provides significant improvement. Compliance problems include exercise and diet.   Diabetes She presents for her follow-up diabetic visit. She has type 2 diabetes mellitus. Her disease course has been stable. There are no hypoglycemic associated symptoms. Pertinent negatives for hypoglycemia include no headaches or sweats. Pertinent negatives for diabetes include no blurred vision, no chest pain, no fatigue, no foot paresthesias, no foot ulcerations, no polydipsia, no polyphagia, no polyuria, no visual change, no weakness and no weight loss. There are no hypoglycemic complications. Symptoms are stable. There are no diabetic complications. Current diabetic treatment includes oral agent (dual therapy). She is compliant with treatment all of the time. Her weight is stable. She is following a generally healthy diet. Meal planning includes avoidance of concentrated sweets. She has not had a previous visit with a dietician. She never participates in exercise. There is no change in her home blood glucose trend. Her breakfast blood glucose range is generally 70-90 mg/dl. Her lunch blood glucose range is generally 90-110 mg/dl. Her dinner blood glucose range is generally 140-180 mg/dl. Her highest blood glucose is 180-200 mg/dl. Her overall blood glucose range is 130-140 mg/dl. An ACE inhibitor/angiotensin II receptor blocker is being taken. Eye exam is  current.      Review of Systems  Constitutional: Negative.  Negative for weight loss, malaise/fatigue and fatigue.  HENT: Negative.  Negative for neck pain.   Eyes: Negative.  Negative for blurred vision.  Respiratory: Negative.  Negative for shortness of breath.   Cardiovascular: Negative.  Negative for chest pain, palpitations, orthopnea and PND.  Gastrointestinal: Negative.   Genitourinary: Negative.  Negative for polyuria.  Musculoskeletal: Negative.   Skin: Negative.   Neurological: Negative.  Negative for weakness and headaches.  Hematological: Negative.  Negative for polydipsia and polyphagia.  Psychiatric/Behavioral: Negative.        Objective:   Physical Exam  Vitals reviewed. Constitutional: She is oriented to person, place, and time. She appears well-developed and well-nourished. No distress.  HENT:  Head: Normocephalic and atraumatic.  Mouth/Throat: Oropharynx is clear and moist. No oropharyngeal exudate.  Eyes: Conjunctivae are normal. Right eye exhibits no discharge. Left eye exhibits no discharge. No scleral icterus.  Neck: Normal range of motion. Neck supple. No JVD present. No tracheal deviation present. No thyromegaly present.  Cardiovascular: Normal rate, regular rhythm, normal heart sounds and intact distal pulses.  Exam reveals no gallop and no friction rub.   No murmur heard. Pulmonary/Chest: Effort normal and breath sounds normal. No stridor. No respiratory distress. She has no wheezes. She has no rales. She exhibits no tenderness.  Abdominal: Soft. Bowel sounds are normal. She exhibits no distension and no mass. There is no tenderness. There is no rebound and no guarding.  Musculoskeletal: Normal range of motion. She exhibits no edema and no tenderness.  Lymphadenopathy:    She has no cervical adenopathy.  Neurological: She is oriented to person, place, and time.  Skin: Skin is warm and dry. No rash noted. She is  not diaphoretic. No erythema. No pallor.    Psychiatric: She has a normal mood and affect. Her behavior is normal. Judgment and thought content normal.      Lab Results  Component Value Date   WBC 6.0 09/15/2010   HGB 11.9* 09/15/2010   HCT 37.2 09/15/2010   PLT 336.0 09/15/2010   GLUCOSE 168* 09/15/2010   CHOL 258* 09/15/2010   TRIG 194.0* 09/15/2010   HDL 43.30 09/15/2010   LDLDIRECT 200.2 09/15/2010   LDLCALC 111* 06/24/2009   ALT 9 09/15/2010   AST 14 09/15/2010   NA 137 09/15/2010   K 4.0 09/15/2010   CL 101 09/15/2010   CREATININE 1.3* 09/15/2010   BUN 27* 09/15/2010   CO2 24 09/15/2010   TSH 1.01 09/15/2010   INR 0.99 12/09/2009   HGBA1C 6.8* 09/15/2010   MICROALBUR 1.95* 02/09/2009      Assessment & Plan:

## 2010-12-07 NOTE — Assessment & Plan Note (Signed)
It sounds like she is well controlled, I will check her a1c and see if her meds need adjusting, also today I will monitor her renal function since she takes metformin, I restarted crestor for her

## 2010-12-07 NOTE — Assessment & Plan Note (Signed)
Her BP is well controlled, I will monitor her lytes and renal function

## 2010-12-07 NOTE — Assessment & Plan Note (Signed)
Restart crestor 

## 2010-12-20 ENCOUNTER — Other Ambulatory Visit: Payer: Self-pay | Admitting: Internal Medicine

## 2010-12-21 ENCOUNTER — Other Ambulatory Visit: Payer: Medicare Other

## 2010-12-22 ENCOUNTER — Ambulatory Visit
Admission: RE | Admit: 2010-12-22 | Discharge: 2010-12-22 | Disposition: A | Discharge status: Home / Self Care | Payer: Medicare Other | Source: Ambulatory Visit | Attending: Internal Medicine | Admitting: Internal Medicine

## 2010-12-22 DIAGNOSIS — R928 Other abnormal and inconclusive findings on diagnostic imaging of breast: Secondary | ICD-10-CM

## 2010-12-28 ENCOUNTER — Other Ambulatory Visit: Payer: Self-pay | Admitting: Internal Medicine

## 2011-04-20 ENCOUNTER — Emergency Department (HOSPITAL_COMMUNITY): Payer: Medicare Other

## 2011-04-20 ENCOUNTER — Encounter (HOSPITAL_COMMUNITY): Payer: Self-pay

## 2011-04-20 ENCOUNTER — Inpatient Hospital Stay (HOSPITAL_COMMUNITY)
Admission: EM | Admit: 2011-04-20 | Discharge: 2011-04-24 | DRG: 690 | Disposition: A | Discharge status: Home / Self Care | Payer: Medicare Other | Attending: Internal Medicine | Admitting: Internal Medicine

## 2011-04-20 ENCOUNTER — Other Ambulatory Visit: Payer: Self-pay

## 2011-04-20 DIAGNOSIS — J9819 Other pulmonary collapse: Secondary | ICD-10-CM | POA: Diagnosis not present

## 2011-04-20 DIAGNOSIS — R000 Tachycardia, unspecified: Secondary | ICD-10-CM | POA: Diagnosis not present

## 2011-04-20 DIAGNOSIS — F1410 Cocaine abuse, uncomplicated: Secondary | ICD-10-CM | POA: Diagnosis present

## 2011-04-20 DIAGNOSIS — K5900 Constipation, unspecified: Secondary | ICD-10-CM | POA: Diagnosis present

## 2011-04-20 DIAGNOSIS — I10 Essential (primary) hypertension: Secondary | ICD-10-CM | POA: Diagnosis present

## 2011-04-20 DIAGNOSIS — H814 Vertigo of central origin: Secondary | ICD-10-CM | POA: Diagnosis present

## 2011-04-20 DIAGNOSIS — Z8679 Personal history of other diseases of the circulatory system: Secondary | ICD-10-CM

## 2011-04-20 DIAGNOSIS — I69959 Hemiplegia and hemiparesis following unspecified cerebrovascular disease affecting unspecified side: Secondary | ICD-10-CM

## 2011-04-20 DIAGNOSIS — N390 Urinary tract infection, site not specified: Principal | ICD-10-CM | POA: Diagnosis present

## 2011-04-20 DIAGNOSIS — E876 Hypokalemia: Secondary | ICD-10-CM | POA: Diagnosis present

## 2011-04-20 DIAGNOSIS — E785 Hyperlipidemia, unspecified: Secondary | ICD-10-CM | POA: Diagnosis not present

## 2011-04-20 DIAGNOSIS — R05 Cough: Secondary | ICD-10-CM | POA: Diagnosis not present

## 2011-04-20 DIAGNOSIS — Z1231 Encounter for screening mammogram for malignant neoplasm of breast: Secondary | ICD-10-CM

## 2011-04-20 DIAGNOSIS — R197 Diarrhea, unspecified: Secondary | ICD-10-CM

## 2011-04-20 DIAGNOSIS — E782 Mixed hyperlipidemia: Secondary | ICD-10-CM | POA: Diagnosis not present

## 2011-04-20 DIAGNOSIS — E1129 Type 2 diabetes mellitus with other diabetic kidney complication: Secondary | ICD-10-CM | POA: Diagnosis present

## 2011-04-20 DIAGNOSIS — G4700 Insomnia, unspecified: Secondary | ICD-10-CM

## 2011-04-20 DIAGNOSIS — J069 Acute upper respiratory infection, unspecified: Secondary | ICD-10-CM | POA: Diagnosis present

## 2011-04-20 DIAGNOSIS — G319 Degenerative disease of nervous system, unspecified: Secondary | ICD-10-CM | POA: Diagnosis not present

## 2011-04-20 DIAGNOSIS — M169 Osteoarthritis of hip, unspecified: Secondary | ICD-10-CM

## 2011-04-20 DIAGNOSIS — E119 Type 2 diabetes mellitus without complications: Secondary | ICD-10-CM | POA: Diagnosis not present

## 2011-04-20 DIAGNOSIS — R509 Fever, unspecified: Secondary | ICD-10-CM

## 2011-04-20 DIAGNOSIS — R5381 Other malaise: Secondary | ICD-10-CM | POA: Diagnosis not present

## 2011-04-20 DIAGNOSIS — N179 Acute kidney failure, unspecified: Secondary | ICD-10-CM | POA: Diagnosis present

## 2011-04-20 DIAGNOSIS — IMO0001 Reserved for inherently not codable concepts without codable children: Secondary | ICD-10-CM | POA: Diagnosis present

## 2011-04-20 DIAGNOSIS — F329 Major depressive disorder, single episode, unspecified: Secondary | ICD-10-CM

## 2011-04-20 DIAGNOSIS — R079 Chest pain, unspecified: Secondary | ICD-10-CM | POA: Diagnosis not present

## 2011-04-20 DIAGNOSIS — I679 Cerebrovascular disease, unspecified: Secondary | ICD-10-CM | POA: Diagnosis present

## 2011-04-20 LAB — APTT: aPTT: 32 seconds (ref 24–37)

## 2011-04-20 LAB — CBC
HCT: 33.7 % — ABNORMAL LOW (ref 36.0–46.0)
Hemoglobin: 12.7 g/dL (ref 12.0–15.0)
MCHC: 31.9 g/dL (ref 30.0–36.0)
MCHC: 32 g/dL (ref 30.0–36.0)
MCV: 80 fL (ref 78.0–100.0)
RDW: 13.2 % (ref 11.5–15.5)
RDW: 13.3 % (ref 11.5–15.5)

## 2011-04-20 LAB — URINALYSIS, ROUTINE W REFLEX MICROSCOPIC
Glucose, UA: NEGATIVE mg/dL
Protein, ur: 100 mg/dL — AB
pH: 5.5 (ref 5.0–8.0)

## 2011-04-20 LAB — RAPID URINE DRUG SCREEN, HOSP PERFORMED
Benzodiazepines: NOT DETECTED
Opiates: NOT DETECTED

## 2011-04-20 LAB — PROTIME-INR: INR: 1.07 (ref 0.00–1.49)

## 2011-04-20 LAB — DIFFERENTIAL
Basophils Absolute: 0.1 10*3/uL (ref 0.0–0.1)
Basophils Relative: 1 % (ref 0–1)
Monocytes Relative: 15 % — ABNORMAL HIGH (ref 3–12)
Neutro Abs: 3.7 10*3/uL (ref 1.7–7.7)
Neutrophils Relative %: 67 % (ref 43–77)

## 2011-04-20 LAB — COMPREHENSIVE METABOLIC PANEL
AST: 14 U/L (ref 0–37)
Albumin: 4.2 g/dL (ref 3.5–5.2)
Alkaline Phosphatase: 66 U/L (ref 39–117)
Chloride: 100 mEq/L (ref 96–112)
Potassium: 3.7 mEq/L (ref 3.5–5.1)
Total Bilirubin: 0.3 mg/dL (ref 0.3–1.2)

## 2011-04-20 LAB — GLUCOSE, CAPILLARY: Glucose-Capillary: 167 mg/dL — ABNORMAL HIGH (ref 70–99)

## 2011-04-20 LAB — URINE MICROSCOPIC-ADD ON

## 2011-04-20 LAB — CARDIAC PANEL(CRET KIN+CKTOT+MB+TROPI)
Relative Index: INVALID (ref 0.0–2.5)
Troponin I: <0.3 ng/mL (ref <0.30)

## 2011-04-20 MED ORDER — ATORVASTATIN CALCIUM 10 MG PO TABS: 10.0000 mg | ORAL_TABLET | Freq: Every day | ORAL | Status: DC

## 2011-04-20 MED ORDER — INSULIN ASPART 100 UNIT/ML ~~LOC~~ SOLN
0.0000 [IU] | Freq: Three times a day (TID) | SUBCUTANEOUS | Status: DC
  Administered 2011-04-22 – 2011-04-24 (×4): 1 [IU] via SUBCUTANEOUS

## 2011-04-20 MED ORDER — LEVOFLOXACIN IN D5W 750 MG/150ML IV SOLN
750.0000 mg | INTRAVENOUS | Status: DC
  Administered 2011-04-20 – 2011-04-22 (×3): 750 mg via INTRAVENOUS
  Filled 2011-04-20 (×4): qty 150

## 2011-04-20 MED ORDER — NITROGLYCERIN 0.4 MG SL SUBL: 0.4000 mg | SUBLINGUAL_TABLET | SUBLINGUAL | Status: DC | PRN

## 2011-04-20 MED ORDER — HYDROCHLOROTHIAZIDE 12.5 MG PO CAPS
12.5000 mg | ORAL_CAPSULE | Freq: Every day | ORAL | Status: DC
  Administered 2011-04-21 – 2011-04-22 (×2): 12.5 mg via ORAL
  Filled 2011-04-20 (×3): qty 1

## 2011-04-20 MED ORDER — SODIUM CHLORIDE 0.9 % IV SOLN
INTRAVENOUS | Status: AC
  Administered 2011-04-20: 21:00:00 via INTRAVENOUS

## 2011-04-20 MED ORDER — ROSUVASTATIN CALCIUM 20 MG PO TABS
20.0000 mg | ORAL_TABLET | Freq: Every day | ORAL | Status: DC
  Administered 2011-04-21 – 2011-04-24 (×4): 20 mg via ORAL
  Filled 2011-04-20 (×5): qty 1

## 2011-04-20 MED ORDER — ONDANSETRON HCL 4 MG PO TABS: 4.0000 mg | ORAL_TABLET | Freq: Four times a day (QID) | ORAL | Status: DC | PRN

## 2011-04-20 MED ORDER — ACETAMINOPHEN 325 MG PO TABS
650.0000 mg | ORAL_TABLET | Freq: Once | ORAL | Status: AC
  Administered 2011-04-20: 650 mg via ORAL
  Filled 2011-04-20: qty 2

## 2011-04-20 MED ORDER — DEXTROSE 5 % IV SOLN
1.0000 g | INTRAVENOUS | Status: DC
  Administered 2011-04-20: 18:00:00 via INTRAVENOUS
  Administered 2011-04-21 – 2011-04-22 (×2): 1 g via INTRAVENOUS
  Filled 2011-04-20 (×4): qty 10

## 2011-04-20 MED ORDER — SENNOSIDES-DOCUSATE SODIUM 8.6-50 MG PO TABS
1.0000 | ORAL_TABLET | Freq: Two times a day (BID) | ORAL | Status: DC
  Administered 2011-04-20 – 2011-04-23 (×2): 1 via ORAL
  Filled 2011-04-20 (×11): qty 1

## 2011-04-20 MED ORDER — BENAZEPRIL HCL 10 MG PO TABS
10.0000 mg | ORAL_TABLET | Freq: Every day | ORAL | Status: DC
  Administered 2011-04-21 – 2011-04-22 (×2): 10 mg via ORAL
  Filled 2011-04-20 (×3): qty 1

## 2011-04-20 MED ORDER — BISACODYL 10 MG RE SUPP: 10.0000 mg | Freq: Every day | RECTAL | Status: DC | PRN

## 2011-04-20 MED ORDER — METFORMIN HCL 500 MG PO TABS
1000.0000 mg | ORAL_TABLET | Freq: Two times a day (BID) | ORAL | Status: DC
  Administered 2011-04-20: 1000 mg via ORAL
  Filled 2011-04-20 (×3): qty 2

## 2011-04-20 MED ORDER — CLOPIDOGREL BISULFATE 75 MG PO TABS
75.0000 mg | ORAL_TABLET | Freq: Every day | ORAL | Status: DC
  Administered 2011-04-21 – 2011-04-24 (×4): 75 mg via ORAL
  Filled 2011-04-20 (×5): qty 1

## 2011-04-20 MED ORDER — ONDANSETRON HCL 4 MG/2ML IJ SOLN: 4.0000 mg | Freq: Four times a day (QID) | INTRAMUSCULAR | Status: DC | PRN

## 2011-04-20 MED ORDER — POLYVINYL ALCOHOL 1.4 % OP SOLN
2.0000 [drp] | Freq: Two times a day (BID) | OPHTHALMIC | Status: DC
  Administered 2011-04-20 – 2011-04-24 (×8): 2 [drp] via OPHTHALMIC
  Filled 2011-04-20: qty 15

## 2011-04-20 MED ORDER — TAMSULOSIN HCL 0.4 MG PO CAPS
0.4000 mg | ORAL_CAPSULE | Freq: Every day | ORAL | Status: DC
  Administered 2011-04-21 – 2011-04-24 (×4): 0.4 mg via ORAL
  Filled 2011-04-20 (×5): qty 1

## 2011-04-20 MED ORDER — HEPARIN SODIUM (PORCINE) 5000 UNIT/ML IJ SOLN
5000.0000 [IU] | Freq: Three times a day (TID) | INTRAMUSCULAR | Status: DC
  Administered 2011-04-20 – 2011-04-24 (×11): 5000 [IU] via SUBCUTANEOUS
  Filled 2011-04-20 (×17): qty 1

## 2011-04-20 MED ORDER — GLIPIZIDE 5 MG PO TABS
5.0000 mg | ORAL_TABLET | Freq: Two times a day (BID) | ORAL | Status: DC
  Filled 2011-04-20 (×2): qty 1

## 2011-04-20 MED ORDER — POLYETHYL GLYCOL-PROPYL GLYCOL 0.4-0.3 % OP SOLN: 2.0000 [drp] | Freq: Two times a day (BID) | OPHTHALMIC | Status: DC

## 2011-04-20 MED ORDER — GUAIFENESIN-DM 100-10 MG/5ML PO SYRP: 5.0000 mL | ORAL_SOLUTION | ORAL | Status: DC | PRN

## 2011-04-20 MED ORDER — SODIUM CHLORIDE 0.9 % IV BOLUS (SEPSIS)
1000.0000 mL | Freq: Once | INTRAVENOUS | Status: AC
  Administered 2011-04-20: 1000 mL via INTRAVENOUS

## 2011-04-20 MED ORDER — AMLODIPINE BESYLATE 10 MG PO TABS
10.0000 mg | ORAL_TABLET | Freq: Every day | ORAL | Status: DC
  Administered 2011-04-21 – 2011-04-24 (×4): 10 mg via ORAL
  Filled 2011-04-20 (×5): qty 1

## 2011-04-20 MED ORDER — POLYETHYLENE GLYCOL 3350 17 G PO PACK
17.0000 g | PACK | Freq: Two times a day (BID) | ORAL | Status: DC
  Administered 2011-04-20: 17 g via ORAL
  Filled 2011-04-20 (×11): qty 1

## 2011-04-20 MED ORDER — ALBUTEROL SULFATE (5 MG/ML) 0.5% IN NEBU
2.5000 mg | INHALATION_SOLUTION | RESPIRATORY_TRACT | Status: DC | PRN
Start: 2011-04-20 — End: 2011-04-24

## 2011-04-20 MED ORDER — ASPIRIN 81 MG PO TABS: 81.0000 mg | ORAL_TABLET | Freq: Every day | ORAL | Status: DC

## 2011-04-20 MED ORDER — HYDROCODONE-ACETAMINOPHEN 5-325 MG PO TABS
1.0000 | ORAL_TABLET | ORAL | Status: DC | PRN
  Administered 2011-04-22 – 2011-04-23 (×2): 2 via ORAL
  Filled 2011-04-20 (×2): qty 2

## 2011-04-20 MED ORDER — SODIUM CHLORIDE 0.9 % IJ SOLN
3.0000 mL | Freq: Two times a day (BID) | INTRAMUSCULAR | Status: DC
  Administered 2011-04-20 – 2011-04-22 (×3): 3 mL via INTRAVENOUS

## 2011-04-20 NOTE — ED Notes (Signed)
Attempted to call report-placed on hold

## 2011-04-20 NOTE — ED Notes (Signed)
Pt states hx of CVA years ago with deficit to rt side, states now having weakness/aching/heaviness to all extriemties. C/o nonproductive cough x2days. C/o lower abdominal pain. Pt denies n/v/d

## 2011-04-20 NOTE — ED Provider Notes (Signed)
History     CSN: [id number]  Arrival date & time 04/20/11  1129   First MD Initiated Contact with Patient 04/20/11 1201      Chief Complaint  Patient presents with  . Extremity Weakness  . Cough    HPI Patient presents to the emergency room with complaints of cough and generalized weakness. She states her last few days she's had a nonproductive cough with some congestion. She has not had any vomiting or diarrhea. She has history of a prior stroke with resulting partial left hemiparesthesias. Today however she notes she is even having trouble lifting her right leg she also feels it in both arms. Patient does feel achy all over as well. She has had a fever as well. Her symptoms are worse whenever she tries to move around. She describes her pain as a 7/10 Past Medical History  Diagnosis Date  . Hyperlipidemia   . Diabetes mellitus     type 2, uncontrolled  . Essential hypertension, benign   . Cocaine abuse, unspecified   . Diarrhea   . Cerebrovascular disease, unspecified     Past Surgical History  Procedure Date  . Total hip arthroplasty 1970    Dr. Rodell Perna  . Total hip arthroplasty     Family History  Problem Relation Age of Onset  . Diabetes Other   . Stroke Other     History  Substance Use Topics  . Smoking status: Never Smoker   . Smokeless tobacco: Not on file  . Alcohol Use: No    OB History    Grav Para Term Preterm Abortions TAB SAB Ect Mult Living                  Review of Systems  All other systems reviewed and are negative.    Allergies  Morphine; Pravastatin sodium; and Simvastatin  Home Medications   Current Outpatient Rx  Name Route Sig Dispense Refill  . AMLODIPINE BESYLATE 10 MG PO TABS Oral Take 10 mg by mouth daily.      Marland Kitchen AMLODIPINE BESYLATE 10 MG PO TABS  TAKE 1 TABLET BY MOUTH EVERY DAY 30 tablet PRN  . ASPIRIN 81 MG PO TABS Oral Take 81 mg by mouth daily.      Marland Kitchen BENAZEPRIL HCL 10 MG PO TABS  TAKE 1 TABLET BY MOUTH EVERY DAY  30 tablet PRN  . CLOPIDOGREL BISULFATE 75 MG PO TABS  TAKE 1 TABLET BY MOUTH EVERY DAY 30 tablet PRN  . CRESTOR 20 MG PO TABS  TAKE 1 TABLET BY MOUTH EVERY NIGHT AT BEDTIME 30 tablet PRN  . GLIPIZIDE ER 5 MG PO TB24 Oral Take 5 mg by mouth daily.      Marland Kitchen HYDROCHLOROTHIAZIDE 12.5 MG PO CAPS  TAKE 1 CAPSULE BY MOUTH EVERY DAY 30 capsule PRN  . MECLIZINE HCL 25 MG PO TABS Oral Take 25 mg by mouth every 6 (six) hours as needed.      Marland Kitchen METFORMIN HCL 1000 MG PO TABS  TAKE 1 TABLET BY MOUTH TWICE DAILY 60 tablet PRN  . PRODIGY NO CODING BLOOD GLUC VI STRP  USE TO CHECK BLOOD SUGAR TWICE DAILY 100 each PRN  . PRODIGY TWIST TOP LANCETS 28G MISC  USE TO CHECK BLOOD SUGAR TWICE DAILY 100 each PRN  . TAMSULOSIN HCL 0.4 MG PO CAPS  TAKE 1 CAPSULE BY MOUTH EVERY DAY 30 capsule PRN    BP 135/59  Pulse 123  Temp(Src) 101.1 F (38.4  C) (Oral)  Resp 18  SpO2 94%  Physical Exam  Nursing note and vitals reviewed. Constitutional: She appears well-developed and well-nourished. No distress.  HENT:  Head: Normocephalic and atraumatic.  Right Ear: External ear normal.  Left Ear: External ear normal.  Eyes: Conjunctivae are normal. Right eye exhibits no discharge. Left eye exhibits no discharge. No scleral icterus.  Neck: Neck supple. No tracheal deviation present.  Cardiovascular: Normal rate, regular rhythm and intact distal pulses.   Pulmonary/Chest: Effort normal and breath sounds normal. No stridor. No respiratory distress. She has no wheezes. She has no rales.  Abdominal: Soft. Bowel sounds are normal. She exhibits no distension. There is no tenderness. There is no rebound and no guarding.  Musculoskeletal: She exhibits no edema and no tenderness.  Neurological: She is alert. No cranial nerve deficit or sensory deficit. She exhibits abnormal muscle tone. She displays no seizure activity. Coordination normal.       Father 5 grip strength bilaterally, no facial asymmetry, 5 over 5 strength right lower  extremity, able to lift the leg off the bed, unable to lift left leg off the  Skin: Skin is warm and dry. No rash noted.  Psychiatric: She has a normal mood and affect.    ED Course  Procedures (including critical care time)  Date: 04/20/2011  Rate: 115  Rhythm: sinus tachycardia and premature ventricular contractions (PVC)  QRS Axis: normal  Intervals: normal  ST/T Wave abnormalities: nonspecific T wave changes  Conduction Disutrbances:none  Narrative Interpretation:   Old EKG Reviewed: changes noted   Medications  benazepril (LOTENSIN) 10 MG tablet (not administered)  clopidogrel (PLAVIX) 75 MG tablet (not administered)  metFORMIN (GLUCOPHAGE) 500 MG tablet (not administered)  Tamsulosin HCl (FLOMAX) 0.4 MG CAPS (not administered)  hydrochlorothiazide (MICROZIDE) 12.5 MG capsule (not administered)  rosuvastatin (CRESTOR) 20 MG tablet (not administered)  glipiZIDE (GLUCOTROL) 5 MG tablet (not administered)  Polyethyl Glycol-Propyl Glycol (SYSTANE OP) (not administered)  cefTRIAXone (ROCEPHIN) 1 g in dextrose 5 % 50 mL IVPB (not administered)  sodium chloride 0.9 % bolus 1,000 mL (1000 mL Intravenous Given 04/20/11 1648)  acetaminophen (TYLENOL) tablet 650 mg (650 mg Oral Given 04/20/11 1646)    Labs Reviewed  CBC - Abnormal; Notable for the following:    MCH 25.7 (*)    All other components within normal limits  DIFFERENTIAL - Abnormal; Notable for the following:    Monocytes Relative 15 (*)    All other components within normal limits  COMPREHENSIVE METABOLIC PANEL - Abnormal; Notable for the following:    Glucose, Bld 188 (*)    Creatinine, Ser 1.11 (*)    GFR calc non Af Amer 50 (*)    GFR calc Af Amer 58 (*)    All other components within normal limits  GLUCOSE, CAPILLARY - Abnormal; Notable for the following:    Glucose-Capillary 167 (*)    All other components within normal limits  PROTIME-INR  APTT  CK TOTAL AND CKMB  TROPONIN I  URINALYSIS, ROUTINE W REFLEX  MICROSCOPIC   Dg Chest 1 View  04/20/2011  *RADIOLOGY REPORT*  Clinical Data: Cough.  Hypertension.  Chest pain.  Weakness.  CHEST - 1 VIEW  Comparison: 12/14/2009  Findings: Possible remote left clavicular trauma.  Mild osteopenia. Patient rotated to the left. Midline trachea.  Normal heart size for level of inspiration.  No pleural effusion or pneumothorax. Numerous leads and wires project over the chest.  Low lung volumes with resultant pulmonary interstitial prominence.  The chin overlies the left apex.  Mild volume loss/atelectasis at the left lung base.  IMPRESSION: Low lung volumes without acute disease.  Original Report Authenticated By: Areta Haber, M.D.   Ct Head Wo Contrast  04/20/2011  *RADIOLOGY REPORT*  Clinical Data: Weakness, cough.  CT HEAD WITHOUT CONTRAST  Technique:  Contiguous axial images were obtained from the base of the skull through the vertex without contrast.  Comparison: 09/23/2008  Findings: There is atrophy and chronic small vessel disease changes. No acute intracranial abnormality.  Specifically, no hemorrhage, hydrocephalus, mass lesion, acute infarction, or significant intracranial injury.  No acute calvarial abnormality. Visualized paranasal sinuses and mastoids clear.  Orbital soft tissues unremarkable.  IMPRESSION: No acute intracranial abnormality.  Atrophy, chronic microvascular disease.  Original Report Authenticated By: Raelyn Number, M.D.     1. UTI (lower urinary tract infection)   2. Fever   3. Tachycardia       MDM  I suspect the patient's general weakness and myalgias are related to her febrile illness. At this time she does not appear in any distress. X-ray does not suggest an obvious pneumonia however she has been having coughing.  I am awaiting the results of her urinalysis.  4:58 PM urinalysis is consistent with a urinary tract infection. Patient clinically appears well but with her tachycardia fever and weakness she will be admitted to the  hospital for further treatment      Kathalene Frames, MD 04/20/11 1659

## 2011-04-20 NOTE — ED Notes (Signed)
Report given to Shirlee Limerick, RN-transfer to [phone]

## 2011-04-20 NOTE — H&P (Signed)
Michelle Lopez CSN:[id number],[id number]  Outpatient Primary MD for the patient is Scarlette Calico, MD, MD  With History of -  Past Medical History  Diagnosis Date  . Hyperlipidemia   . Diabetes mellitus     type 2, uncontrolled  . Essential hypertension, benign   . Cocaine abuse, unspecified   . Diarrhea   . Cerebrovascular disease, unspecified       Past Surgical History  Procedure Date  . Total hip arthroplasty 1970    Dr. Rodell Perna  . Total hip arthroplasty     in for   Chief Complaint  Patient presents with  . Extremity Weakness  . Cough     HPI  Michelle Lopez  is a 66 y.o. 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    Review of Systems    In addition to the HPI above,   + Fever-chills, No Headache, No changes with Vision or hearing, No problems swallowing food or Liquids, No Chest pain, + Cough , No Shortness of Breath, No Abdominal pain, No Nausea or Vommitting, Bowel movements are regular, No Blood in stool or Urine, + dysuria, No new skin rashes or bruises, No new joints pains-aches,  No new focal weakness, tingling, numbness in any extremity, No recent weight gain or loss, No polyuria, polydypsia or polyphagia, No significant Mental Stressors.  A full 10 point Review of Systems was done, except as stated above, all other Review of Systems were negative.   Social History History  Substance Use Topics  . Smoking status: Never Smoker   . Smokeless tobacco: Not on file  . Alcohol Use: No      Family History Family History  Problem Relation Age of Onset  . Diabetes Other   . Stroke Other       Prior to Admission medications   Medication Sig Start Date End Date Taking? Authorizing Provider  amLODipine (NORVASC) 10 MG  tablet Take 10 mg by mouth daily.     Yes Historical Provider, MD  aspirin 81 MG tablet Take 81 mg by mouth daily.     Yes Historical Provider, MD  benazepril (LOTENSIN) 10 MG tablet Take 10 mg by mouth daily.   Yes Historical Provider, MD  clopidogrel (PLAVIX) 75 MG tablet Take 75 mg by mouth daily.   Yes Historical Provider, MD  glipiZIDE (GLUCOTROL) 5 MG tablet Take 5 mg by mouth 2 (two) times daily before a meal.   Yes Historical Provider, MD  hydrochlorothiazide (MICROZIDE) 12.5 MG capsule Take 12.5 mg by mouth daily.   Yes Historical Provider, MD  metFORMIN (GLUCOPHAGE) 500 MG tablet Take 1,000 mg by mouth 2 (two) times daily with a meal.   Yes Historical Provider, MD  Polyethyl Glycol-Propyl Glycol (SYSTANE OP) Place 1 drop into both eyes 2 (two) times daily.   Yes Historical Provider, MD  PRODIGY NO CODING BLOOD GLUC test strip USE TO CHECK BLOOD SUGAR TWICE DAILY 08/05/10  Yes Janith Lima, MD  PRODIGY TWIST TOP LANCETS 28G MISC USE TO CHECK BLOOD SUGAR TWICE DAILY 10/31/10  Yes Janith Lima, MD  rosuvastatin (CRESTOR) 20 MG tablet Take 20 mg by mouth daily.   Yes Historical Provider, MD  Tamsulosin HCl (FLOMAX) 0.4 MG CAPS  Take 0.4 mg by mouth daily.   Yes Historical Provider, MD    Allergies  Allergen Reactions  . Morphine Hives  . Pravastatin Sodium Rash  . Simvastatin Rash    Physical Exam  Vitals  Blood pressure 115/59, pulse 120, temperature 101.4 F (38.6 C), temperature source Oral, resp. rate 31, SpO2 99.00%.   1. General middle aged [place] female lying in bed in NAD,     2. Normal affect and insight, Not Suicidal or Homicidal, Awake Alert, Oriented *3.  3. No F.N deficits, ALL C.Nerves Intact, Strength 5/5 Rt sided extremities, Sensation intact all 4 extremities, Plantars down going.Left side 4/5  4. Ears and Eyes appear Normal, Conjunctivae clear, PERRLA. Moist Oral Mucosa.  5. Supple Neck, No JVD, No cervical lymphadenopathy appriciated, No Carotid  Bruits.  6. Symmetrical Chest wall movement, Good air movement bilaterally, few rales.  7. RRR, No Gallops, Rubs or Murmurs, No Parasternal Heave.  8. Positive Bowel Sounds, Abdomen Soft, Non tender, No organomegaly appriciated,       No rebound -guarding or rigidity.  9.  No Cyanosis, Normal Skin Turgor, No Skin Rash or Bruise.  10. Good muscle tone,  joints appear normal , no effusions, Normal ROM.  11. No Palpable Lymph Nodes in Neck or Axillae     Data Review  CBC  Lab 04/20/11 1229  WBC 5.6  HGB 12.7  HCT 39.8  PLT 297  MCV 80.6  MCH 25.7*  MCHC 31.9  RDW 13.2  LYMPHSABS 1.0  MONOABS 0.8  EOSABS 0.0  BASOSABS 0.1  BANDABS --   ------------------------------------------------------------------------------------------------------------------ Chemistries   Lab 04/20/11 1229  NA 138  K 3.7  CL 100  CO2 26  GLUCOSE 188*  BUN 17  CREATININE 1.11*  CALCIUM 9.8  MG --  AST 14  ALT 6  ALKPHOS 66  BILITOT 0.3   ------------------------------------------------------------------------------------------------------------------ CrCl is unknown because both a height and weight (above a minimum accepted value) are required for this calculation. ------------------------------------------------------------------------------------------------------------------ No results found for this basename: TSH,T4TOTAL,FREET3,T3FREE,THYROIDAB in the last 72 hours  Coagulation profile  Lab 04/20/11 1229  INR 1.07  PROTIME --   ------------------------------------------------------------------------------------------------------------------- No results found for this basename: DDIMER:2 in the last 72 hours ------------------------------------------------------------------------------------------------------------------- Cardiac Enzymes  Lab 04/20/11 1229  CKMB 0.9  TROPONINI <0.30  MYOGLOBIN --    ------------------------------------------------------------------------------------------------------------------ No components found with this basename: POCBNP:3 ------------------------------------------------------------------------------------------------------------------  Imaging results:   Dg Chest 1 View  04/20/2011  *RADIOLOGY REPORT*  Clinical Data: Cough.  Hypertension.  Chest pain.  Weakness.  CHEST - 1 VIEW  Comparison: 12/14/2009  Findings: Possible remote left clavicular trauma.  Mild osteopenia. Patient rotated to the left. Midline trachea.  Normal heart size for level of inspiration.  No pleural effusion or pneumothorax. Numerous leads and wires project over the chest.  Low lung volumes with resultant pulmonary interstitial prominence.  The chin overlies the left apex.  Mild volume loss/atelectasis at the left lung base.  IMPRESSION: Low lung volumes without acute disease.  Original Report Authenticated By: Areta Haber, M.D.   Ct Head Wo Contrast  04/20/2011  *RADIOLOGY REPORT*  Clinical Data: Weakness, cough.  CT HEAD WITHOUT CONTRAST  Technique:  Contiguous axial images were obtained from the base of the skull through the vertex without contrast.  Comparison: 09/23/2008  Findings: There is atrophy and chronic small vessel disease changes. No acute intracranial abnormality.  Specifically, no hemorrhage, hydrocephalus, mass lesion, acute infarction, or significant intracranial injury.  No acute calvarial  abnormality. Visualized paranasal sinuses and mastoids clear.  Orbital soft tissues unremarkable.  IMPRESSION: No acute intracranial abnormality.  Atrophy, chronic microvascular disease.  Original Report Authenticated By: Raelyn Number, M.D.    My personal review of EKG: Rhythm NSR, Rate  115 /min, QTc437 , T wave inversion Inf leads.    Assessment & Plan  1. Gen weakness due to UTI + URI - pan culture- IV Levaquin, PRN Nebs-o2, monitor. Gentle IVF, PT to see in  am.   2. H/O DM-2 - check A1c, home meds + ISS   3. 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   4.EKG change - pain free, No old EKG in chart, H/O Cociane abuse, declines current use, check UDS, follow Card enzymes, Echo-Tele, PRN NTG, ASA-Plavix continue.   5.HTN - home meds to contiue monitor trend.   DVT Prophylaxis Heparin   AM Labs Ordered, also please review Full Orders  Admission, patients condition and plan of care including tests being ordered have been discussed with the patient and daughter who indicate understanding and agree with the plan and Code Status.  Code Status Full  Condition Jill Side K M.D on 04/20/2011 at 5:17 PM  [facility] Office  [phone]

## 2011-04-20 NOTE — ED Notes (Signed)
Attempted to call report to 4 Filimon Miranda-no answer

## 2011-04-20 NOTE — ED Notes (Signed)
MD at bedside. 

## 2011-04-21 ENCOUNTER — Inpatient Hospital Stay (HOSPITAL_COMMUNITY): Payer: Medicare Other

## 2011-04-21 ENCOUNTER — Other Ambulatory Visit: Payer: Self-pay

## 2011-04-21 DIAGNOSIS — R05 Cough: Secondary | ICD-10-CM | POA: Diagnosis not present

## 2011-04-21 DIAGNOSIS — R0602 Shortness of breath: Secondary | ICD-10-CM | POA: Diagnosis not present

## 2011-04-21 DIAGNOSIS — E119 Type 2 diabetes mellitus without complications: Secondary | ICD-10-CM | POA: Diagnosis not present

## 2011-04-21 DIAGNOSIS — E782 Mixed hyperlipidemia: Secondary | ICD-10-CM | POA: Diagnosis not present

## 2011-04-21 DIAGNOSIS — J069 Acute upper respiratory infection, unspecified: Secondary | ICD-10-CM | POA: Diagnosis not present

## 2011-04-21 DIAGNOSIS — I517 Cardiomegaly: Secondary | ICD-10-CM

## 2011-04-21 DIAGNOSIS — I69959 Hemiplegia and hemiparesis following unspecified cerebrovascular disease affecting unspecified side: Secondary | ICD-10-CM | POA: Diagnosis not present

## 2011-04-21 LAB — CBC
Hemoglobin: 11 g/dL — ABNORMAL LOW (ref 12.0–15.0)
MCH: 25.6 pg — ABNORMAL LOW (ref 26.0–34.0)
MCV: 80.4 fL (ref 78.0–100.0)
RBC: 4.29 MIL/uL (ref 3.87–5.11)

## 2011-04-21 LAB — BASIC METABOLIC PANEL
CO2: 25 mEq/L (ref 19–32)
Calcium: 8.7 mg/dL (ref 8.4–10.5)
Glucose, Bld: 66 mg/dL — ABNORMAL LOW (ref 70–99)
Potassium: 3.1 mEq/L — ABNORMAL LOW (ref 3.5–5.1)
Sodium: 137 mEq/L (ref 135–145)

## 2011-04-21 LAB — CARDIAC PANEL(CRET KIN+CKTOT+MB+TROPI)
CK, MB: 1.2 ng/mL (ref 0.3–4.0)
Relative Index: INVALID (ref 0.0–2.5)
Total CK: 69 U/L (ref 7–177)
Troponin I: <0.3 ng/mL (ref <0.30)

## 2011-04-21 LAB — GLUCOSE, CAPILLARY
Glucose-Capillary: 91 mg/dL (ref 70–99)
Glucose-Capillary: 68 mg/dL — ABNORMAL LOW (ref 70–99)
Glucose-Capillary: 83 mg/dL (ref 70–99)
Glucose-Capillary: 72 mg/dL (ref 70–99)

## 2011-04-21 MED ORDER — ZOLPIDEM TARTRATE 5 MG PO TABS
5.0000 mg | ORAL_TABLET | Freq: Every evening | ORAL | Status: DC | PRN
  Administered 2011-04-21: 5 mg via ORAL
  Filled 2011-04-21: qty 1

## 2011-04-21 MED ORDER — METFORMIN HCL 500 MG PO TABS
1000.0000 mg | ORAL_TABLET | Freq: Two times a day (BID) | ORAL | Status: DC
  Administered 2011-04-21 – 2011-04-24 (×5): 1000 mg via ORAL
  Filled 2011-04-21 (×9): qty 2

## 2011-04-21 MED ORDER — POTASSIUM CHLORIDE CRYS ER 20 MEQ PO TBCR
40.0000 meq | EXTENDED_RELEASE_TABLET | Freq: Once | ORAL | Status: AC
  Administered 2011-04-21: 40 meq via ORAL
  Filled 2011-04-21: qty 2

## 2011-04-21 MED ORDER — METFORMIN HCL 500 MG PO TABS
500.0000 mg | ORAL_TABLET | Freq: Two times a day (BID) | ORAL | Status: DC
  Administered 2011-04-21: 500 mg via ORAL
  Filled 2011-04-21 (×2): qty 1

## 2011-04-21 NOTE — Progress Notes (Signed)
  Echocardiogram 2D Echocardiogram has been performed.  Alvin Critchley A 04/21/2011, 1:49 PM

## 2011-04-21 NOTE — Telephone Encounter (Signed)
Unable to verify if qd or bid dur to pt in the hospital

## 2011-04-21 NOTE — Progress Notes (Signed)
INITIAL ADULT NUTRITION ASSESSMENT Date: 04/21/2011   Time: 3:17 PM Reason for Assessment: health hx  ASSESSMENT: Female 66 y.o.  Dx: UTI (lower urinary tract infection)  Hx:  Past Medical History  Diagnosis Date  . Hyperlipidemia   . Diabetes mellitus     type 2, uncontrolled  . Essential hypertension, benign   . Cocaine abuse, unspecified   . Diarrhea   . Cerebrovascular disease, unspecified    Past Surgical History  Procedure Date  . Total hip arthroplasty 1970    Dr. Rodell Perna  . Total hip arthroplasty     Related Meds:  Scheduled Meds:   . acetaminophen  650 mg Oral Once  . amLODipine  10 mg Oral Daily  . benazepril  10 mg Oral Daily  . cefTRIAXone (ROCEPHIN)  IV  1 g Intravenous Q24H  . clopidogrel  75 mg Oral Q breakfast  . heparin  5,000 Units Subcutaneous Q8H  . hydrochlorothiazide  12.5 mg Oral Daily  . insulin aspart  0-9 Units Subcutaneous TID WC  . levofloxacin (LEVAQUIN) IV  750 mg Intravenous Q24H  . metFORMIN  1,000 mg Oral BID WC  . polyethylene glycol  17 g Oral BID  . polyvinyl alcohol  2 drop Both Eyes BID  . potassium chloride  40 mEq Oral Once  . rosuvastatin  20 mg Oral q1800  . senna-docusate  1 tablet Oral BID  . sodium chloride  1,000 mL Intravenous Once  . sodium chloride  3 mL Intravenous Q12H  . Tamsulosin HCl  0.4 mg Oral Daily  . DISCONTD: aspirin  81 mg Oral Daily  . DISCONTD: atorvastatin  10 mg Oral q1800  . DISCONTD: glipiZIDE  5 mg Oral BID AC  . DISCONTD: metFORMIN  1,000 mg Oral BID WC  . DISCONTD: metFORMIN  500 mg Oral BID WC  . DISCONTD: Polyethyl Glycol-Propyl Glycol  2 drop Both Eyes BID   Continuous Infusions:   . sodium chloride 50 mL/hr at 04/20/11 2106   PRN Meds:.albuterol, bisacodyl, guaiFENesin-dextromethorphan, HYDROcodone-acetaminophen, nitroGLYCERIN, ondansetron (ZOFRAN) IV, ondansetron   Ht: 5' (152.4 cm)  Wt: 137 lb 4.8 oz (62.279 kg)  Ideal Wt: 55.7 kg % Ideal Wt: 111%  Usual Wt: 62.1 kg  (11/2010) % Usual Wt: 100%  Body mass index is 26.81 kg/(m^2).  Food/Nutrition Related Hx: h/o CVAs x4 in the past, h/o dysphagia, chronic L sided hemiparesis.  Labs:  CMP     Component Value Date/Time   NA 137 04/21/2011 0250   K 3.1* 04/21/2011 0250   CL 102 04/21/2011 0250   CO2 25 04/21/2011 0250   GLUCOSE 66* 04/21/2011 0250   BUN 17 04/21/2011 0250   CREATININE 1.27* 04/21/2011 0250   CALCIUM 8.7 04/21/2011 0250   PROT 8.3 04/20/2011 1229   ALBUMIN 4.2 04/20/2011 1229   AST 14 04/20/2011 1229   ALT 6 04/20/2011 1229   ALKPHOS 66 04/20/2011 1229   BILITOT 0.3 04/20/2011 1229   GFRNONAA 42* 04/21/2011 0250   GFRAA 49* 04/21/2011 0250    CBC    Component Value Date/Time   WBC 4.6 04/21/2011 0250   RBC 4.29 04/21/2011 0250   HGB 11.0* 04/21/2011 0250   HCT 34.5* 04/21/2011 0250   PLT 232 04/21/2011 0250   MCV 80.4 04/21/2011 0250   MCH 25.6* 04/21/2011 0250   MCHC 31.9 04/21/2011 0250   RDW 13.4 04/21/2011 0250   LYMPHSABS 1.0 04/20/2011 1229   MONOABS 0.8 04/20/2011 1229   EOSABS 0.0 04/20/2011  1229   BASOSABS 0.1 04/20/2011 1229    Intake: 0% Output: 1-3 BMs daily  Intake/Output Summary (Last 24 hours) at 04/21/11 1521 Last data filed at 04/21/11 1500  Gross per 24 hour  Intake    720 ml  Output    204 ml  Net    516 ml     Diet Order: Carb Control  Supplements/Tube Feeding:  none  IVF:    sodium chloride Last Rate: 50 mL/hr at 04/20/11 2106    Estimated Nutritional Needs:   Kcal: 1550-1740 kcal Protein: 62-74g Fluid: >1.8 L/day  NUTRITION DIAGNOSIS: -Swallowing difficulty (NI-1.1).  Status: Ongoing  RELATED TO: h/o of CVAs, dysphagia  AS EVIDENCE BY: pt report  MONITORING/EVALUATION(Goals): 1.  Food/Beverage; pt to consume >50% of meals 2.  Head/Neck; to tolerate diet without s/s aspiration  EDUCATION NEEDS: -Education needs addressed  INTERVENTION: 1.  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  Reports she was on a  pureed diet at some point last year.  She would with a ST and was able to progress to a regular diet.  Pt reports she gets choked on food- solids and liquids- requiring her to cough it up. Although this does not affect her intake, it concerns her.  MD- if appropriate, consider evaluation by SLP.  Dietitian [phone]  DOCUMENTATION CODES Per approved criteria  -Not Applicable    Brynda Greathouse [facility] 04/21/2011, 3:17 PM

## 2011-04-21 NOTE — Progress Notes (Signed)
[name] CSN:[id number],MRN:[id number] is a 66 y.o. female,  Outpatient Primary MD for the patient is Michelle Calico, MD, MD  Chief Complaint  Patient presents with  . Extremity Weakness  . Cough        Subjective:   Michelle Lopez today has, No headache, No chest pain, No abdominal pain - No Nausea, No new weakness tingling or numbness, ++ Cough -  No SOB.    Objective:   Filed Vitals:   04/20/11 1850 04/20/11 2140 04/21/11 0506 04/21/11 0918  BP: 111/68 116/65 137/68 130/76  Pulse: 103 98 109   Temp: 99.7 F (37.6 C) 99.1 F (37.3 C) 99.2 F (37.3 C)   TempSrc: Oral Oral Oral   Resp: 24 22 20    Height: 5' (1.524 m)     Weight:   62.279 kg (137 lb 4.8 oz)   SpO2: 96% 92% 93%     Wt Readings from Last 3 Encounters:  04/21/11 62.279 kg (137 lb 4.8 oz)  12/07/10 62.143 kg (137 lb)  09/15/10 60.102 kg (132 lb 8 oz)     Intake/Output Summary (Last 24 hours) at 04/21/11 1209 Last data filed at 04/21/11 0900  Gross per 24 hour  Intake    720 ml  Output    203 ml  Net    517 ml    Exam Awake Alert, Oriented *3, No new F.N deficits, Normal affect [name].AT,PERRAL Supple Neck,No JVD, No cervical lymphadenopathy appriciated.  Symmetrical Chest wall movement, Good air movement bilaterally, few rales RRR,No Gallops,Rubs or new Murmurs, No Parasternal Heave +ve B.Sounds, Abd Soft, Non tender, No organomegaly appriciated, No rebound -guarding or rigidity. No Cyanosis, Clubbing or edema, No new Rash or bruise     Data Review  CBC  Lab 04/21/11 0250 04/20/11 1945 04/20/11 1229  WBC 4.6 4.3 5.6  HGB 11.0* 10.8* 12.7  HCT 34.5* 33.7* 39.8  PLT 232 242 297  MCV 80.4 80.0 80.6  MCH 25.6* 25.7* 25.7*  MCHC 31.9 32.0 31.9  RDW 13.4 13.3 13.2  LYMPHSABS -- -- 1.0  MONOABS -- -- 0.8  EOSABS -- -- 0.0  BASOSABS -- -- 0.1  BANDABS -- -- --    Chemistries   Lab 04/21/11 0250 04/20/11 1229  NA 137 138  K 3.1* 3.7  CL 102 100  CO2 25 26  GLUCOSE 66* 188*  BUN 17 17    CREATININE 1.27* 1.11*  CALCIUM 8.7 9.8  MG -- --  AST -- 14  ALT -- 6  ALKPHOS -- 66  BILITOT -- 0.3   ------------------------------------------------------------------------------------------------------------------ estimated creatinine clearance is 34.9 ml/min (by C-G formula based on Cr of 1.27). ------------------------------------------------------------------------------------------------------------------  [facility] 04/20/11 1945  HGBA1C 7.1*   ------------------------------------------------------------------------------------------------------------------ No results found for this basename: CHOL:2,HDL:2,LDLCALC:2,TRIG:2,CHOLHDL:2,LDLDIRECT:2 in the last 72 hours ------------------------------------------------------------------------------------------------------------------  [facility] 04/20/11 1945  TSH 0.595  T4TOTAL --  T3FREE --  THYROIDAB --   ------------------------------------------------------------------------------------------------------------------ No results found for this basename: VITAMINB12:2,FOLATE:2,FERRITIN:2,TIBC:2,IRON:2,RETICCTPCT:2 in the last 72 hours  Coagulation profile  Lab 04/20/11 1229  INR 1.07  PROTIME --    No results found for this basename: DDIMER:2 in the last 72 hours  Cardiac Enzymes  Lab 04/21/11 1125 04/21/11 0250 04/20/11 1945  CKMB 1.4 1.2 1.0  TROPONINI <0.30 <0.30 <0.30  MYOGLOBIN -- -- --   ------------------------------------------------------------------------------------------------------------------ No components found with this basename: POCBNP:3  Micro Results No results found for this or any previous visit (from the past 240 hour(s)).  Radiology Reports Dg Chest 1 View  04/20/2011  *RADIOLOGY REPORT*  Clinical Data: Cough.  Hypertension.  Chest pain.  Weakness.  CHEST - 1 VIEW  Comparison: 12/14/2009  Findings: Possible remote left clavicular trauma.  Mild osteopenia. Patient rotated to the left.  Midline trachea.  Normal heart size for level of inspiration.  No pleural effusion or pneumothorax. Numerous leads and wires project over the chest.  Low lung volumes with resultant pulmonary interstitial prominence.  The chin overlies the left apex.  Mild volume loss/atelectasis at the left lung base.  IMPRESSION: Low lung volumes without acute disease.  Original Report Authenticated By: Areta Haber, M.D.   Ct Head Wo Contrast  04/20/2011  *RADIOLOGY REPORT*  Clinical Data: Weakness, cough.  CT HEAD WITHOUT CONTRAST  Technique:  Contiguous axial images were obtained from the base of the skull through the vertex without contrast.  Comparison: 09/23/2008  Findings: There is atrophy and chronic small vessel disease changes. No acute intracranial abnormality.  Specifically, no hemorrhage, hydrocephalus, mass lesion, acute infarction, or significant intracranial injury.  No acute calvarial abnormality. Visualized paranasal sinuses and mastoids clear.  Orbital soft tissues unremarkable.  IMPRESSION: No acute intracranial abnormality.  Atrophy, chronic microvascular disease.  Original Report Authenticated By: Raelyn Number, M.D.    Scheduled Meds:   . acetaminophen  650 mg Oral Once  . amLODipine  10 mg Oral Daily  . benazepril  10 mg Oral Daily  . cefTRIAXone (ROCEPHIN)  IV  1 g Intravenous Q24H  . clopidogrel  75 mg Oral Q breakfast  . heparin  5,000 Units Subcutaneous Q8H  . hydrochlorothiazide  12.5 mg Oral Daily  . insulin aspart  0-9 Units Subcutaneous TID WC  . levofloxacin (LEVAQUIN) IV  750 mg Intravenous Q24H  . metFORMIN  500 mg Oral BID WC  . polyethylene glycol  17 g Oral BID  . polyvinyl alcohol  2 drop Both Eyes BID  . potassium chloride  40 mEq Oral Once  . rosuvastatin  20 mg Oral q1800  . senna-docusate  1 tablet Oral BID  . sodium chloride  1,000 mL Intravenous Once  . sodium chloride  3 mL Intravenous Q12H  . Tamsulosin HCl  0.4 mg Oral Daily  . DISCONTD: aspirin  81 mg  Oral Daily  . DISCONTD: atorvastatin  10 mg Oral q1800  . DISCONTD: glipiZIDE  5 mg Oral BID AC  . DISCONTD: metFORMIN  1,000 mg Oral BID WC  . DISCONTD: Polyethyl Glycol-Propyl Glycol  2 drop Both Eyes BID   Continuous Infusions:   . sodium chloride 50 mL/hr at 04/20/11 2106   PRN Meds:.albuterol, bisacodyl, guaiFENesin-dextromethorphan, HYDROcodone-acetaminophen, nitroGLYCERIN, ondansetron (ZOFRAN) IV, ondansetron  Assessment & Plan    1. Gen weakness due to UTI + URI - pan culture- IV Levaquin continue, repeat 2 view CXR, , PRN Nebs-o2, monitor. PT to see.  2. H/O DM-2 - noted stable A1c, home meds adjusted as Low sugars last night(DM diet)  +  ISS   Off glucotrol for now  Lab Results  Component Value Date   HGBA1C 7.1* 04/20/2011     CBG (last 3)   Basename 04/21/11 0747 04/21/11 0445 04/21/11 0357  GLUCAP 93 112* 70       3. 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      4.EKG change - pain free, No old EKG in chart, H/O Cociane abuse, declines current use, -ve UDS, -ve Card enzymes, Echo pending monitor on Tele, PRN  NTG, ASA-Plavix continue.     5.HTN - home meds to contiue monitor trend.    6. Low K - replaced.    DVT Prophylaxis  Heparin       Thurnell Lose M.D on 04/21/2011 at 12:09 PM  [facility] Office  [phone]

## 2011-04-21 NOTE — Telephone Encounter (Signed)
Received faxed refill request for glipizide xl 5mg 

## 2011-04-21 NOTE — Progress Notes (Signed)
CBG: 57       Treatment: 15 GM carbohydrate snack, orange juice  Symptoms: None  Follow up CBG Result:112  Possible Reasons for Event: Medication regimen: 1000mg  metformin at 2000  Comments/MD notified: sticky note left for MD

## 2011-04-21 NOTE — Progress Notes (Signed)
MD on call notified about hypoglycemia. Metformin orders have been modified.

## 2011-04-21 NOTE — Evaluation (Signed)
Physical Therapy Evaluation Patient Details Name: Henryetta Guastella MRN: [id number] DOB: 08-Jun-1945 Today's Date: 04/21/2011  Problem List:  Patient Active Problem List  Diagnoses  . DIABETES MELLITUS, TYPE II, UNCONTROLLED  . HYPERLIPIDEMIA  . DEPRESSION  . VERTIGO, CENTRAL  . Essential hypertension, benign  . CEREBROVASCULAR DISEASE  . OSTEOARTHRITIS, HIP, RIGHT  . INSOMNIA  . LEFT ACUTE POSTERIOR CIRCULATION INFARCT  . Other screening mammogram  . Hyperlipidemia  . Diarrhea  . Cocaine abuse, unspecified  . UTI (lower urinary tract infection)  . URI (upper respiratory infection)    Past Medical History:  Past Medical History  Diagnosis Date  . Hyperlipidemia   . Diabetes mellitus     type 2, uncontrolled  . Essential hypertension, benign   . Cocaine abuse, unspecified   . Diarrhea   . Cerebrovascular disease, unspecified    Past Surgical History:  Past Surgical History  Procedure Date  . Total hip arthroplasty 1970    Dr. Rodell Perna  . Total hip arthroplasty     PT Assessment/Plan/Recommendation PT Assessment Clinical Impression Statement: Pt diagnosed with UTI.  Pt would benefit from acute PT services in order to improve independence with ambulation and activity tolerance to prepare for d/c home.  Pt declined ambulation but supervision level for transfers BSC to recliner.  Pt did require assist for hygiene today but reports it is only 2* to her not feeling well.  Pt reports she feels her daughters could assist her upon return home. PT Recommendation/Assessment: Patient will need skilled PT in the acute care venue PT Problem List: Decreased activity tolerance;Decreased mobility PT Therapy Diagnosis : Difficulty walking PT Plan PT Frequency: Min 3X/week PT Treatment/Interventions: DME instruction;Gait training;Functional mobility training;Therapeutic activities;Therapeutic exercise PT Recommendation Follow Up Recommendations: Home health PT (depending on  progress) Equipment Recommended: None recommended by PT PT Goals  Acute Rehab PT Goals PT Goal Formulation: With patient Time For Goal Achievement: 7 days Pt will go Sit to Stand: with modified independence PT Goal: Sit to Stand - Progress: Goal set today Pt will go Stand to Sit: with modified independence PT Goal: Stand to Sit - Progress: Goal set today Pt will Ambulate: >150 feet;with modified independence;with least restrictive assistive device PT Goal: Ambulate - Progress: Goal set today Pt will Perform Home Exercise Program: with supervision, verbal cues required/provided PT Goal: Perform Home Exercise Program - Progress: Goal set today  PT Evaluation Precautions/Restrictions    Prior Functioning  Home Living Lives With: Alone Receives Help From:  (daughter comes everyday to assist) Type of Home: Apartment Home Layout: One level Home Adaptive Equipment: Shower chair with back;Walker - standard;Straight cane Additional Comments: Pt reports she doesn't know where her SW is located. Prior Function Level of Independence: Independent with basic ADLs;Requires assistive device for independence;Needs assistance with homemaking Comments: Pt reports daughter comes to assist with housekeeping and meals.  Pt reports she is mod I with ADLs and ambulates with cane. Cognition Cognition Arousal/Alertness: Awake/alert Overall Cognitive Status: Appears within functional limits for tasks assessed Sensation/Coordination   Extremity Assessment RUE Assessment RUE Assessment: Within Functional Limits LUE Assessment LUE Assessment: Exceptions to [facility] LUE Strength LUE Overall Strength: Due to premorbid status RLE Assessment RLE Assessment: Within Functional Limits LLE Strength LLE Overall Strength Comments: grossly 4/5 throughout, except DF 3+/5 Mobility (including Balance) Bed Mobility Bed Mobility: No (pt on BSC upon entering room) Transfers Transfers: Yes Sit to Stand: 5:  Supervision;With upper extremity assist;From chair/3-in-1 Sit to Stand Details (indicate cue type  and reason): verbal cues for hand placement Stand to Sit: 5: Supervision;With upper extremity assist;To chair/3-in-1 Stand Pivot Transfers: 5: Supervision Stand Pivot Transfer Details (indicate cue type and reason): pt able to take a couple steps to recliner without assist or assistive device. Ambulation/Gait Ambulation/Gait: No (pt declined today)    Exercise    End of Session PT - End of Session Activity Tolerance: Patient tolerated treatment well Patient left: in chair;with call bell in reach General Behavior During Session: [facility] for tasks performed Cognition: [facility] for tasks performed  Devean Skoczylas,KATHrine E 04/21/2011, 12:47 PM Pager: [phone]

## 2011-04-22 DIAGNOSIS — E119 Type 2 diabetes mellitus without complications: Secondary | ICD-10-CM | POA: Diagnosis not present

## 2011-04-22 DIAGNOSIS — J069 Acute upper respiratory infection, unspecified: Secondary | ICD-10-CM | POA: Diagnosis not present

## 2011-04-22 DIAGNOSIS — E782 Mixed hyperlipidemia: Secondary | ICD-10-CM | POA: Diagnosis not present

## 2011-04-22 DIAGNOSIS — I69959 Hemiplegia and hemiparesis following unspecified cerebrovascular disease affecting unspecified side: Secondary | ICD-10-CM | POA: Diagnosis not present

## 2011-04-22 LAB — URINE CULTURE
Colony Count: 25000
Culture  Setup Time: 201303150347

## 2011-04-22 LAB — GLUCOSE, CAPILLARY
Glucose-Capillary: 114 mg/dL — ABNORMAL HIGH (ref 70–99)
Glucose-Capillary: 147 mg/dL — ABNORMAL HIGH (ref 70–99)

## 2011-04-22 MED ORDER — MAGNESIUM SULFATE 40 MG/ML IJ SOLN
2.0000 g | Freq: Once | INTRAMUSCULAR | Status: AC
  Administered 2011-04-22: 2 g via INTRAVENOUS
  Filled 2011-04-22: qty 50

## 2011-04-22 NOTE — Progress Notes (Signed)
Michelle Lopez CSN:[id number],[id number] is a 66 y.o. female,  Outpatient Primary MD for the patient is Scarlette Calico, MD, MD  Chief Complaint  Patient presents with  . Extremity Weakness  . Cough        Subjective:   Michelle Lopez today has, No headache, No chest pain, No abdominal pain - No Nausea, No new weakness tingling or numbness, Imporoved Cough -  No SOB.  Some weakness  Objective:   Filed Vitals:   04/21/11 0918 04/21/11 1500 04/21/11 2225 04/22/11 0709  BP: 130/76 110/70 125/80 117/71  Pulse:  102 101 96  Temp:  100.2 F (37.9 C) 99.5 F (37.5 C) 98.7 F (37.1 C)  TempSrc:  Oral Oral   Resp:  20 18 18   Height:      Weight:    62.2 kg (137 lb 2 oz)  SpO2:  96% 96% 97%    Wt Readings from Last 3 Encounters:  04/22/11 62.2 kg (137 lb 2 oz)  12/07/10 62.143 kg (137 lb)  09/15/10 60.102 kg (132 lb 8 oz)     Intake/Output Summary (Last 24 hours) at 04/22/11 1051 Last data filed at 04/22/11 0900  Gross per 24 hour  Intake    513 ml  Output    851 ml  Net   -338 ml    Exam Awake Alert, Oriented *3, No new F.N deficits, Normal affect [place].AT,PERRAL Supple Neck,No JVD, No cervical lymphadenopathy appriciated.  Symmetrical Chest wall movement, Good air movement bilaterally, few rales RRR,No Gallops,Rubs or new Murmurs, No Parasternal Heave +ve B.Sounds, Abd Soft, Non tender, No organomegaly appriciated, No rebound -guarding or rigidity. No Cyanosis, Clubbing or edema, No new Rash or bruise     Data Review  CBC  Lab 04/21/11 0250 04/20/11 1945 04/20/11 1229  WBC 4.6 4.3 5.6  HGB 11.0* 10.8* 12.7  HCT 34.5* 33.7* 39.8  PLT 232 242 297  MCV 80.4 80.0 80.6  MCH 25.6* 25.7* 25.7*  MCHC 31.9 32.0 31.9  RDW 13.4 13.3 13.2  LYMPHSABS -- -- 1.0  MONOABS -- -- 0.8  EOSABS -- -- 0.0  BASOSABS -- -- 0.1  BANDABS -- -- --    Chemistries   Lab 04/22/11 0518 04/21/11 0250 04/20/11 1229  NA -- 137 138  K 3.7 3.1* 3.7  CL -- 102 100  CO2 -- 25 26  GLUCOSE  -- 66* 188*  BUN -- 17 17  CREATININE -- 1.27* 1.11*  CALCIUM -- 8.7 9.8  MG 1.2* -- --  AST -- -- 14  ALT -- -- 6  ALKPHOS -- -- 66  BILITOT -- -- 0.3   ------------------------------------------------------------------------------------------------------------------ estimated creatinine clearance is 34.9 ml/min (by C-G formula based on Cr of 1.27). ------------------------------------------------------------------------------------------------------------------  [facility] 04/20/11 1945  HGBA1C 7.1*   ------------------------------------------------------------------------------------------------------------------ No results found for this basename: CHOL:2,HDL:2,LDLCALC:2,TRIG:2,CHOLHDL:2,LDLDIRECT:2 in the last 72 hours ------------------------------------------------------------------------------------------------------------------  [facility] 04/20/11 1945  TSH 0.595  T4TOTAL --  T3FREE --  THYROIDAB --   ------------------------------------------------------------------------------------------------------------------ No results found for this basename: VITAMINB12:2,FOLATE:2,FERRITIN:2,TIBC:2,IRON:2,RETICCTPCT:2 in the last 72 hours  Coagulation profile  Lab 04/20/11 1229  INR 1.07  PROTIME --    No results found for this basename: DDIMER:2 in the last 72 hours  Cardiac Enzymes  Lab 04/21/11 1125 04/21/11 0250 04/20/11 1945  CKMB 1.4 1.2 1.0  TROPONINI <0.30 <0.30 <0.30  MYOGLOBIN -- -- --   ------------------------------------------------------------------------------------------------------------------ No components found with this basename: POCBNP:3  Micro Results Recent Results (from the past 240 hour(s))  URINE CULTURE  Status: Normal   Collection Time   04/20/11  4:49 PM      Component Value Range Status Comment   Specimen Description URINE, CLEAN CATCH   Final    Special Requests NONE   Final    Culture  Setup Time [id number]   Final    Colony  Count 25,000 COLONIES/ML   Final    Culture     Final    Value: Multiple bacterial morphotypes present, none predominant. Suggest appropriate recollection if clinically indicated.   Report Status 04/22/2011 FINAL   Final   CULTURE, BLOOD (ROUTINE X 2)     Status: Normal (Preliminary result)   Collection Time   04/20/11  5:55 PM      Component Value Range Status Comment   Specimen Description BLOOD LEFT ANTECUBITAL   Final    Special Requests BOTTLES DRAWN AEROBIC AND ANAEROBIC 5 CC EACH   Final    Culture  Setup Time [id number]   Final    Culture     Final    Value:        BLOOD CULTURE RECEIVED NO GROWTH TO DATE CULTURE WILL BE HELD FOR 5 DAYS BEFORE ISSUING A FINAL NEGATIVE REPORT   Report Status PENDING   Incomplete   CULTURE, BLOOD (ROUTINE X 2)     Status: Normal (Preliminary result)   Collection Time   04/20/11  6:00 PM      Component Value Range Status Comment   Specimen Description BLOOD LEFT HAND   Final    Special Requests BOTTLES DRAWN AEROBIC AND ANAEROBIC 5 CC EACH   Final    Culture  Setup Time [id number]   Final    Culture     Final    Value:        BLOOD CULTURE RECEIVED NO GROWTH TO DATE CULTURE WILL BE HELD FOR 5 DAYS BEFORE ISSUING A FINAL NEGATIVE REPORT   Report Status PENDING   Incomplete     Radiology Reports Dg Chest 1 View  04/20/2011  *RADIOLOGY REPORT*  Clinical Data: Cough.  Hypertension.  Chest pain.  Weakness.  CHEST - 1 VIEW  Comparison: 12/14/2009  Findings: Possible remote left clavicular trauma.  Mild osteopenia. Patient rotated to the left. Midline trachea.  Normal heart size for level of inspiration.  No pleural effusion or pneumothorax. Numerous leads and wires project over the chest.  Low lung volumes with resultant pulmonary interstitial prominence.  The chin overlies the left apex.  Mild volume loss/atelectasis at the left lung base.  IMPRESSION: Low lung volumes without acute disease.  Original Report Authenticated By: Areta Haber, M.D.   Ct  Head Wo Contrast  04/20/2011  *RADIOLOGY REPORT*  Clinical Data: Weakness, cough.  CT HEAD WITHOUT CONTRAST  Technique:  Contiguous axial images were obtained from the base of the skull through the vertex without contrast.  Comparison: 09/23/2008  Findings: There is atrophy and chronic small vessel disease changes. No acute intracranial abnormality.  Specifically, no hemorrhage, hydrocephalus, mass lesion, acute infarction, or significant intracranial injury.  No acute calvarial abnormality. Visualized paranasal sinuses and mastoids clear.  Orbital soft tissues unremarkable.  IMPRESSION: No acute intracranial abnormality.  Atrophy, chronic microvascular disease.  Original Report Authenticated By: Raelyn Number, M.D.    Scheduled Meds:    . amLODipine  10 mg Oral Daily  . benazepril  10 mg Oral Daily  . cefTRIAXone (ROCEPHIN)  IV  1 g Intravenous Q24H  . clopidogrel  75 mg  Oral Q breakfast  . heparin  5,000 Units Subcutaneous Q8H  . hydrochlorothiazide  12.5 mg Oral Daily  . insulin aspart  0-9 Units Subcutaneous TID WC  . levofloxacin (LEVAQUIN) IV  750 mg Intravenous Q24H  . magnesium sulfate 1 - 4 g bolus IVPB  2 g Intravenous Once  . metFORMIN  1,000 mg Oral BID WC  . polyethylene glycol  17 g Oral BID  . polyvinyl alcohol  2 drop Both Eyes BID  . potassium chloride  40 mEq Oral Once  . rosuvastatin  20 mg Oral q1800  . senna-docusate  1 tablet Oral BID  . sodium chloride  3 mL Intravenous Q12H  . Tamsulosin HCl  0.4 mg Oral Daily  . DISCONTD: metFORMIN  500 mg Oral BID WC   Continuous Infusions:  PRN Meds:.albuterol, bisacodyl, guaiFENesin-dextromethorphan, HYDROcodone-acetaminophen, nitroGLYCERIN, ondansetron (ZOFRAN) IV, ondansetron, zolpidem  Assessment & Plan    1. Gen weakness due to UTI + URI - pan culture- IV Levaquin continue, stable repeat 2 view CXR, , PRN Nebs-o2, monitor. PT to see.Improved.  2. H/O DM-2 - noted stable A1c, home meds adjusted as Low sugars last  night(DM diet)  +  ISS   Off glucotrol for now  Lab Results  Component Value Date   HGBA1C 7.1* 04/20/2011     CBG (last 3)   Basename 04/21/11 2144 04/21/11 1654 04/21/11 1139  GLUCAP 93 72 83       3. 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      4.Non specific EKG change on admission - pain free, No old EKG in chart, H/O Cociane abuse, declines current use, -ve UDS, -ve Card enzymes, Echo stable, stable monitor on Tele, PRN NTG, ASA-Plavix continue. Symptom free.  Echo -   Left ventricle: The cavity size was normal. Wall thickness was increased in a pattern of mild LVH. Systolic function was vigorous. The estimated ejection fraction was in the range of 65% to 70%. Doppler parameters are consistent with abnormal left ventricular relaxation (grade 1 diastolic dysfunction).     5.HTN - home meds to contiue monitor trend.     6. Low K - replaced.Stable, Low Mag replaced, check in am.    DVT Prophylaxis  Heparin       Thurnell Lose M.D on 04/22/2011 at 10:51 AM  [facility] Office  [phone]

## 2011-04-22 NOTE — Progress Notes (Signed)
Informed dr. Candiss Norse on rounds, held metformin as pt did not want to eat breakfast with CBG of 114. Stated this was o.k. For now.  Did not want to eat lunch until mid afternoon, about 1430.

## 2011-04-22 NOTE — Progress Notes (Signed)
CARE MANAGEMENT NOTE 04/22/2011  Patient:  Michelle Lopez,Michelle Lopez   Account Number:  [id number]  Date Initiated:  04/22/2011  Documentation initiated by:  Makiyla Linch  Subjective/Objective Assessment:   66 yo female admitted with urinary tract infection. PCP Volanda Napoleon.     Action/Plan:   Home when stable   Anticipated DC Date:  04/24/2011   Anticipated DC Plan:  [place]  In-house referral  NA      DC Planning Services  CM consult      [facility] Choice  HOME HEALTH   Choice offered to / List presented to:  C-1 Patient   DME arranged  NA      DME agency  NA     [place] arranged  HH-2 PT      [place]   Status of service:  Completed, signed off Medicare Important Message given?   (If response is "NO", the following Medicare IM given date fields will be blank) Date Medicare IM given:   Date Additional Medicare IM given:    Discharge Disposition:    Per UR Regulation:    If discussed at Long Length of Stay Meetings, dates discussed:    Comments:  04/22/11 [name] [phone] cm spoke with pt concerning MD order for HHPT. Pt agrees with plan of care,  per pt choice [place] to provide [facility] services. Pt states adult daughter is care taker for pt in home. No other [place] services needed. pt has access to DME. [place] [employer] rep Butch Penny notified of new referral.

## 2011-04-23 DIAGNOSIS — E782 Mixed hyperlipidemia: Secondary | ICD-10-CM | POA: Diagnosis not present

## 2011-04-23 DIAGNOSIS — J069 Acute upper respiratory infection, unspecified: Secondary | ICD-10-CM | POA: Diagnosis not present

## 2011-04-23 DIAGNOSIS — E119 Type 2 diabetes mellitus without complications: Secondary | ICD-10-CM | POA: Diagnosis not present

## 2011-04-23 DIAGNOSIS — I69959 Hemiplegia and hemiparesis following unspecified cerebrovascular disease affecting unspecified side: Secondary | ICD-10-CM | POA: Diagnosis not present

## 2011-04-23 LAB — BASIC METABOLIC PANEL
CO2: 26 mEq/L (ref 19–32)
Calcium: 8.9 mg/dL (ref 8.4–10.5)
Chloride: 99 mEq/L (ref 96–112)
Sodium: 135 mEq/L (ref 135–145)

## 2011-04-23 LAB — GLUCOSE, CAPILLARY
Glucose-Capillary: 129 mg/dL — ABNORMAL HIGH (ref 70–99)
Glucose-Capillary: 116 mg/dL — ABNORMAL HIGH (ref 70–99)

## 2011-04-23 LAB — MAGNESIUM: Magnesium: 1.8 mg/dL (ref 1.5–2.5)

## 2011-04-23 LAB — CBC
Platelets: 217 10*3/uL (ref 150–400)
RBC: 4.5 MIL/uL (ref 3.87–5.11)
WBC: 4.8 10*3/uL (ref 4.0–10.5)

## 2011-04-23 MED ORDER — SODIUM CHLORIDE 0.9 % IV SOLN
INTRAVENOUS | Status: AC
  Administered 2011-04-24: 75 mL/h via INTRAVENOUS

## 2011-04-23 MED ORDER — LEVOFLOXACIN IN D5W 750 MG/150ML IV SOLN: 750.0000 mg | INTRAVENOUS | Status: DC

## 2011-04-23 MED ORDER — LEVOFLOXACIN 500 MG PO TABS: 500.0000 mg | ORAL_TABLET | Freq: Every day | ORAL | Status: DC

## 2011-04-23 MED ORDER — LEVOFLOXACIN 250 MG PO TABS
250.0000 mg | ORAL_TABLET | Freq: Every day | ORAL | Status: DC
  Administered 2011-04-23 – 2011-04-24 (×2): 250 mg via ORAL
  Filled 2011-04-23 (×3): qty 1

## 2011-04-23 NOTE — Progress Notes (Signed)
Michelle Lopez CSN:[id number],MRN:[id number] is a 66 y.o. female,  Outpatient Primary MD for the patient is Scarlette Calico, MD, MD  Chief Complaint  Patient presents with  . Extremity Weakness  . Cough        Subjective:   Michelle Lopez today has, No headache, No chest pain, No abdominal pain - No Nausea, No new weakness tingling or numbness, Imporoved Cough -  No SOB.  Some weakness  Objective:   Filed Vitals:   04/22/11 0709 04/22/11 1300 04/22/11 2226 04/23/11 0556  BP: 117/71 96/60 111/69 100/63  Pulse: 96 102 90 79  Temp: 98.7 F (37.1 C) 99.1 F (37.3 C) 98.8 F (37.1 C) 97.6 F (36.4 C)  TempSrc:  Oral Oral Oral  Resp: 18 16 18 18   Height:      Weight: 62.2 kg (137 lb 2 oz)     SpO2: 97% 94% 93% 96%    Wt Readings from Last 3 Encounters:  04/22/11 62.2 kg (137 lb 2 oz)  12/07/10 62.143 kg (137 lb)  09/15/10 60.102 kg (132 lb 8 oz)     Intake/Output Summary (Last 24 hours) at 04/23/11 1105 Last data filed at 04/23/11 0935  Gross per 24 hour  Intake    500 ml  Output    750 ml  Net   -250 ml    Exam Awake Alert, Oriented *3, No new F.N deficits, Normal affect [place].AT,PERRAL Supple Neck,No JVD, No cervical lymphadenopathy appriciated.  Symmetrical Chest wall movement, Good air movement bilaterally, few rales RRR,No Gallops,Rubs or new Murmurs, No Parasternal Heave +ve B.Sounds, Abd Soft, Non tender, No organomegaly appriciated, No rebound -guarding or rigidity. No Cyanosis, Clubbing or edema, No new Rash or bruise     Data Review  CBC  Lab 04/23/11 0637 04/21/11 0250 04/20/11 1945 04/20/11 1229  WBC 4.8 4.6 4.3 5.6  HGB 11.5* 11.0* 10.8* 12.7  HCT 36.8 34.5* 33.7* 39.8  PLT 217 232 242 297  MCV 81.8 80.4 80.0 80.6  MCH 25.6* 25.6* 25.7* 25.7*  MCHC 31.3 31.9 32.0 31.9  RDW 13.6 13.4 13.3 13.2  LYMPHSABS -- -- -- 1.0  MONOABS -- -- -- 0.8  EOSABS -- -- -- 0.0  BASOSABS -- -- -- 0.1  BANDABS -- -- -- --    Chemistries   Lab 04/23/11 0637 04/22/11  0518 04/21/11 0250 04/20/11 1229  NA 135 -- 137 138  K 3.8 3.7 3.1* 3.7  CL 99 -- 102 100  CO2 26 -- 25 26  GLUCOSE 118* -- 66* 188*  BUN 18 -- 17 17  CREATININE 1.33* -- 1.27* 1.11*  CALCIUM 8.9 -- 8.7 9.8  MG 1.8 1.2* -- --  AST -- -- -- 14  ALT -- -- -- 6  ALKPHOS -- -- -- 66  BILITOT -- -- -- 0.3   ------------------------------------------------------------------------------------------------------------------ estimated creatinine clearance is 33.4 ml/min (by C-G formula based on Cr of 1.33). ------------------------------------------------------------------------------------------------------------------  [facility] 04/20/11 1945  HGBA1C 7.1*   ------------------------------------------------------------------------------------------------------------------ No results found for this basename: CHOL:2,HDL:2,LDLCALC:2,TRIG:2,CHOLHDL:2,LDLDIRECT:2 in the last 72 hours ------------------------------------------------------------------------------------------------------------------  [facility] 04/20/11 1945  TSH 0.595  T4TOTAL --  T3FREE --  THYROIDAB --   ------------------------------------------------------------------------------------------------------------------ No results found for this basename: VITAMINB12:2,FOLATE:2,FERRITIN:2,TIBC:2,IRON:2,RETICCTPCT:2 in the last 72 hours  Coagulation profile  Lab 04/20/11 1229  INR 1.07  PROTIME --    No results found for this basename: DDIMER:2 in the last 72 hours  Cardiac Enzymes  Lab 04/21/11 1125 04/21/11 0250 04/20/11 1945  CKMB 1.4 1.2 1.0  TROPONINI <0.30 <0.30 <0.30  MYOGLOBIN -- -- --   ------------------------------------------------------------------------------------------------------------------ No components found with this basename: POCBNP:3  Micro Results Recent Results (from the past 240 hour(s))  URINE CULTURE     Status: Normal   Collection Time   04/20/11  4:49 PM      Component Value Range  Status Comment   Specimen Description URINE, CLEAN CATCH   Final    Special Requests NONE   Final    Culture  Setup Time [id number]   Final    Colony Count 25,000 COLONIES/ML   Final    Culture     Final    Value: Multiple bacterial morphotypes present, none predominant. Suggest appropriate recollection if clinically indicated.   Report Status 04/22/2011 FINAL   Final   CULTURE, BLOOD (ROUTINE X 2)     Status: Normal (Preliminary result)   Collection Time   04/20/11  5:55 PM      Component Value Range Status Comment   Specimen Description BLOOD LEFT ANTECUBITAL   Final    Special Requests BOTTLES DRAWN AEROBIC AND ANAEROBIC 5 CC EACH   Final    Culture  Setup Time [id number]   Final    Culture     Final    Value:        BLOOD CULTURE RECEIVED NO GROWTH TO DATE CULTURE WILL BE HELD FOR 5 DAYS BEFORE ISSUING A FINAL NEGATIVE REPORT   Report Status PENDING   Incomplete   CULTURE, BLOOD (ROUTINE X 2)     Status: Normal (Preliminary result)   Collection Time   04/20/11  6:00 PM      Component Value Range Status Comment   Specimen Description BLOOD LEFT HAND   Final    Special Requests BOTTLES DRAWN AEROBIC AND ANAEROBIC 5 CC EACH   Final    Culture  Setup Time [id number]   Final    Culture     Final    Value:        BLOOD CULTURE RECEIVED NO GROWTH TO DATE CULTURE WILL BE HELD FOR 5 DAYS BEFORE ISSUING A FINAL NEGATIVE REPORT   Report Status PENDING   Incomplete     Radiology Reports Dg Chest 1 View  04/20/2011  *RADIOLOGY REPORT*  Clinical Data: Cough.  Hypertension.  Chest pain.  Weakness.  CHEST - 1 VIEW  Comparison: 12/14/2009  Findings: Possible remote left clavicular trauma.  Mild osteopenia. Patient rotated to the left. Midline trachea.  Normal heart size for level of inspiration.  No pleural effusion or pneumothorax. Numerous leads and wires project over the chest.  Low lung volumes with resultant pulmonary interstitial prominence.  The chin overlies the left apex.  Mild volume  loss/atelectasis at the left lung base.  IMPRESSION: Low lung volumes without acute disease.  Original Report Authenticated By: Areta Haber, M.D.   Ct Head Wo Contrast  04/20/2011  *RADIOLOGY REPORT*  Clinical Data: Weakness, cough.  CT HEAD WITHOUT CONTRAST  Technique:  Contiguous axial images were obtained from the base of the skull through the vertex without contrast.  Comparison: 09/23/2008  Findings: There is atrophy and chronic small vessel disease changes. No acute intracranial abnormality.  Specifically, no hemorrhage, hydrocephalus, mass lesion, acute infarction, or significant intracranial injury.  No acute calvarial abnormality. Visualized paranasal sinuses and mastoids clear.  Orbital soft tissues unremarkable.  IMPRESSION: No acute intracranial abnormality.  Atrophy, chronic microvascular disease.  Original Report Authenticated By: Raelyn Number, M.D.    Scheduled  Meds:    . amLODipine  10 mg Oral Daily  . clopidogrel  75 mg Oral Q breakfast  . heparin  5,000 Units Subcutaneous Q8H  . insulin aspart  0-9 Units Subcutaneous TID WC  . levofloxacin  500 mg Oral Daily  . magnesium sulfate 1 - 4 g bolus IVPB  2 g Intravenous Once  . metFORMIN  1,000 mg Oral BID WC  . polyethylene glycol  17 g Oral BID  . polyvinyl alcohol  2 drop Both Eyes BID  . rosuvastatin  20 mg Oral q1800  . senna-docusate  1 tablet Oral BID  . sodium chloride  3 mL Intravenous Q12H  . Tamsulosin HCl  0.4 mg Oral Daily  . DISCONTD: benazepril  10 mg Oral Daily  . DISCONTD: cefTRIAXone (ROCEPHIN)  IV  1 g Intravenous Q24H  . DISCONTD: hydrochlorothiazide  12.5 mg Oral Daily  . DISCONTD: levofloxacin (LEVAQUIN) IV  750 mg Intravenous Q24H  . DISCONTD: levofloxacin (LEVAQUIN) IV  750 mg Intravenous Q48H   Continuous Infusions:    . sodium chloride     PRN Meds:.albuterol, bisacodyl, guaiFENesin-dextromethorphan, HYDROcodone-acetaminophen, nitroGLYCERIN, ondansetron (ZOFRAN) IV, ondansetron,  zolpidem  Assessment & Plan    1. Gen weakness due to UTI + URI - pan culture- much better on RA, change to PO Levaquin, no SOB, no fever.    2. H/O DM-2 - noted stable A1c, home meds adjusted as Low sugars last night(DM diet)  +  ISS   Off glucotrol for now  Lab Results  Component Value Date   HGBA1C 7.1* 04/20/2011     CBG (last 3)   Basename 04/22/11 2230 04/22/11 1711 04/22/11 1223  GLUCAP 73 147* 121*       3. 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      4.Non specific EKG change on admission - pain free, No old EKG in chart, H/O Cociane abuse, declines current use, -ve UDS, -ve Card enzymes, Echo stable, stable monitor on Tele, PRN NTG, ASA-Plavix continue. Symptom free.  Echo -   Left ventricle: The cavity size was normal. Wall thickness was increased in a pattern of mild LVH. Systolic function was vigorous. The estimated ejection fraction was in the range of 65% to 70%. Doppler parameters are consistent with abnormal left ventricular relaxation (grade 1 diastolic dysfunction).     5.HTN - home meds to contiue monitor trend.     6. Low K & Low Mag - replaced & Stable.    7.Mild ARF - likely dehydration from fevers, hold ACE-HCTZ, IVF gentle.     DVT Prophylaxis  Heparin       Thurnell Lose M.D on 04/23/2011 at 11:05 AM  [facility] Office  [phone]

## 2011-04-24 ENCOUNTER — Inpatient Hospital Stay (HOSPITAL_COMMUNITY): Payer: Medicare Other

## 2011-04-24 DIAGNOSIS — J069 Acute upper respiratory infection, unspecified: Secondary | ICD-10-CM | POA: Diagnosis not present

## 2011-04-24 DIAGNOSIS — K5900 Constipation, unspecified: Secondary | ICD-10-CM | POA: Diagnosis not present

## 2011-04-24 DIAGNOSIS — E782 Mixed hyperlipidemia: Secondary | ICD-10-CM | POA: Diagnosis not present

## 2011-04-24 DIAGNOSIS — I69959 Hemiplegia and hemiparesis following unspecified cerebrovascular disease affecting unspecified side: Secondary | ICD-10-CM | POA: Diagnosis not present

## 2011-04-24 DIAGNOSIS — R110 Nausea: Secondary | ICD-10-CM | POA: Diagnosis not present

## 2011-04-24 DIAGNOSIS — E119 Type 2 diabetes mellitus without complications: Secondary | ICD-10-CM | POA: Diagnosis not present

## 2011-04-24 LAB — CREATININE, SERUM
Creatinine, Ser: 1.14 mg/dL — ABNORMAL HIGH (ref 0.50–1.10)
GFR calc non Af Amer: 48 mL/min — ABNORMAL LOW (ref >90)

## 2011-04-24 LAB — GLUCOSE, CAPILLARY: Glucose-Capillary: 111 mg/dL — ABNORMAL HIGH (ref 70–99)

## 2011-04-24 MED ORDER — LEVOFLOXACIN 250 MG PO TABS: 250.0000 mg | ORAL_TABLET | Freq: Every day | ORAL | Status: DC

## 2011-04-24 MED ORDER — BISACODYL 10 MG RE SUPP: 10.0000 mg | Freq: Every day | RECTAL | Status: DC | PRN

## 2011-04-24 MED ORDER — SENNOSIDES-DOCUSATE SODIUM 8.6-50 MG PO TABS: 1.0000 | ORAL_TABLET | Freq: Two times a day (BID) | ORAL | Status: DC | PRN

## 2011-04-24 NOTE — Discharge Summary (Addendum)
Michelle Lopez, 66 y.o., DOB May 24, 1945, MRN [id number]. Admission date: 04/20/2011 Discharge Date 04/24/2011 Primary MD Scarlette Calico, MD, MD Admitting Physician Thurnell Lose, MD  Admission Diagnosis  Cocaine abuse, unspecified [305.60] Diarrhea [787.91] Hyperlipidemia [272.4] Tachycardia [785.0] UTI (lower urinary tract infection) [599.0] Fever [780.60] EXTREMITY WEAKNESS, COUGH  Discharge Diagnosis   Principal Problem:  *UTI (lower urinary tract infection) Active Problems:  DIABETES MELLITUS, TYPE II, UNCONTROLLED  HYPERLIPIDEMIA  VERTIGO, CENTRAL  Essential hypertension, benign  CEREBROVASCULAR DISEASE  Cocaine abuse, unspecified  URI (upper respiratory infection)    Past Medical History  Diagnosis Date  . Hyperlipidemia   . Diabetes mellitus     type 2, uncontrolled  . Essential hypertension, benign   . Cocaine abuse, unspecified   . Diarrhea   . Cerebrovascular disease, unspecified     Past Surgical History  Procedure Date  . Total hip arthroplasty 1970    Dr. Rodell Perna  . Total hip arthroplasty      Hospital Course See H&P, Labs, Consult and Test reports for all details in brief, patient was admitted for    1. Gen weakness due to UTI + URI - negative cultures - looks and feels much better on Room Air, changed to PO Levaquin, no SOB, no fever, no dysuria.   2. H/O DM-2 - noted stable A1c, home meds adjusted as Low sugars last here(DM diet) , Off glucotrol for now    Lab Results  Component Value Date   HGBA1C 7.1* 04/20/2011    CBG (last 3)   Basename 04/24/11 1154 04/24/11 1031 04/24/11 0757  GLUCAP 131* 111* 132*      3. 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    4.Non specific EKG change on admission - pain free, No old EKG in chart, H/O Cociane abuse, declines current use, -ve UDS, -ve Card enzymes, Echo stable, stable monitor on Tele, PRN NTG, ASA-Plavix continue. Symptom free.  Outpt Cardiology Follow up recommended.   5.HTN - stable on Norvasc.   6. Low K & Low Mag - replaced & Stable.     7.Mild ARF - likely due to dehydration from fevers, hold ACE-HCTZ, improved post IVF, monitor BMP in 4 days and BP and adjust Meds.     8. Constipation - good results with enema, stool softners given for home PRN use.    Significant Tests:  See full reports for all details    Echo - Left ventricle: The cavity size was normal. Wall thickness was increased in a pattern of mild LVH. Systolic function was vigorous. The estimated ejection fraction was in the range of 65% to 70%. Doppler parameters are consistent with abnormal left ventricular relaxation (grade 1 diastolic dysfunction).     Dg Chest 1 View  04/20/2011  *RADIOLOGY REPORT*  Clinical Data: Cough.  Hypertension.  Chest pain.  Weakness.  CHEST - 1 VIEW  Comparison: 12/14/2009  Findings: Possible remote left clavicular trauma.  Mild osteopenia. Patient rotated to the left. Midline trachea.  Normal heart size for level of inspiration.  No pleural effusion or pneumothorax. Numerous leads and wires project over the chest.  Low lung volumes with resultant pulmonary interstitial prominence.  The chin overlies the left apex.  Mild volume loss/atelectasis at the left lung base.  IMPRESSION: Low lung volumes without acute disease.  Original Report Authenticated By: Areta Haber, M.D.   Dg Chest 2 View  04/21/2011  *  RADIOLOGY REPORT*  Clinical Data: Shortness of breath.  Cough.  CHEST - 2 VIEW  Comparison: 04/20/2011 and 12/14/2009 and a CT scan of the chest dated 12/14/2009  Findings: The heart size and pulmonary vascularity are normal and the lungs are clear except for small calcified granulomas in the left lung.  IMPRESSION: No acute abnormalities.  Original Report Authenticated By: Larey Seat, M.D.   Ct Head Wo Contrast  04/20/2011  *RADIOLOGY REPORT*  Clinical Data: Weakness, cough.  CT HEAD WITHOUT CONTRAST   Technique:  Contiguous axial images were obtained from the base of the skull through the vertex without contrast.  Comparison: 09/23/2008  Findings: There is atrophy and chronic small vessel disease changes. No acute intracranial abnormality.  Specifically, no hemorrhage, hydrocephalus, mass lesion, acute infarction, or significant intracranial injury.  No acute calvarial abnormality. Visualized paranasal sinuses and mastoids clear.  Orbital soft tissues unremarkable.  IMPRESSION: No acute intracranial abnormality.  Atrophy, chronic microvascular disease.  Original Report Authenticated By: Raelyn Number, M.D.   Dg Abd Portable 1v  04/24/2011  *RADIOLOGY REPORT*  Clinical Data: Nausea and constipation.  PORTABLE ABDOMEN - 1 VIEW  Comparison: CT of 10/13/2008  Findings: Single supine view of the abdomen and pelvis.  The far right side of the abdomen is excluded.  No free intraperitoneal air, given limitation of supine imaging.  Moderate ascending colonic stool.  Normal caliber of the colon.  Distal gas identified.  The lower rectum is partially excluded.  No significant small bowel dilatation.  No pneumatosis.  No definite abnormal abdominal calcifications. Left hip arthroplasty.  Right hip osteoarthritis.  IMPRESSION: Moderate ascending colonic stool.  Question constipation.  No obstruction.  Original Report Authenticated By: Areta Haber, M.D.     Today   Subjective:   Michelle Lopez today has no headache,no chest abdominal pain,no new weakness tingling or numbness, feels much better wants to go home today.    Objective:   Blood pressure 111/64, pulse 90, temperature 98.7 F (37.1 C), temperature source Oral, resp. rate 18, height 5' (1.524 m), weight 61.009 kg (134 lb 8 oz), SpO2 94.00%.  Intake/Output Summary (Last 24 hours) at 04/24/11 1358 Last data filed at 04/24/11 1319  Gross per 24 hour  Intake 1563.75 ml  Output    625 ml  Net 938.75 ml    Exam Awake Alert, Oriented *3, No new  F.N deficits, Normal affect [place].AT,PERRAL Supple Neck,No JVD, No cervical lymphadenopathy appriciated.  Symmetrical Chest wall movement, Good air movement bilaterally, CTAB RRR,No Gallops,Rubs or new Murmurs, No Parasternal Heave +ve B.Sounds, Abd Soft, Non tender, No organomegaly appriciated, No rebound -guarding or rigidity. No Cyanosis, Clubbing or edema, No new Rash or bruise  Data Review      CBC w Diff: Lab Results  Component Value Date   WBC 4.8 04/23/2011   HGB 11.5* 04/23/2011   HCT 36.8 04/23/2011   PLT 217 04/23/2011   LYMPHOPCT 18 04/20/2011   MONOPCT 15* 04/20/2011   EOSPCT 0 04/20/2011   BASOPCT 1 04/20/2011   CMP: Lab Results  Component Value Date   NA 135 04/23/2011   K 3.8 04/23/2011   CL 99 04/23/2011   CO2 26 04/23/2011   BUN 18 04/23/2011   CREATININE 1.14* 04/24/2011   PROT 8.3 04/20/2011   ALBUMIN 4.2 04/20/2011   BILITOT 0.3 04/20/2011   ALKPHOS 66 04/20/2011   AST 14 04/20/2011   ALT 6 04/20/2011  .  Micro Results Recent Results (from the  past 240 hour(s))  URINE CULTURE     Status: Normal   Collection Time   04/20/11  4:49 PM      Component Value Range Status Comment   Specimen Description URINE, CLEAN CATCH   Final    Special Requests NONE   Final    Culture  Setup Time [id number]   Final    Colony Count 25,000 COLONIES/ML   Final    Culture     Final    Value: Multiple bacterial morphotypes present, none predominant. Suggest appropriate recollection if clinically indicated.   Report Status 04/22/2011 FINAL   Final   CULTURE, BLOOD (ROUTINE X 2)     Status: Normal (Preliminary result)   Collection Time   04/20/11  5:55 PM      Component Value Range Status Comment   Specimen Description BLOOD LEFT ANTECUBITAL   Final    Special Requests BOTTLES DRAWN AEROBIC AND ANAEROBIC 5 CC EACH   Final    Culture  Setup Time [id number]   Final    Culture     Final    Value:        BLOOD CULTURE RECEIVED NO GROWTH TO DATE CULTURE WILL BE HELD FOR 5 DAYS BEFORE  ISSUING A FINAL NEGATIVE REPORT   Report Status PENDING   Incomplete   CULTURE, BLOOD (ROUTINE X 2)     Status: Normal (Preliminary result)   Collection Time   04/20/11  6:00 PM      Component Value Range Status Comment   Specimen Description BLOOD LEFT HAND   Final    Special Requests BOTTLES DRAWN AEROBIC AND ANAEROBIC 5 CC EACH   Final    Culture  Setup Time [id number]   Final    Culture     Final    Value:        BLOOD CULTURE RECEIVED NO GROWTH TO DATE CULTURE WILL BE HELD FOR 5 DAYS BEFORE ISSUING A FINAL NEGATIVE REPORT   Report Status PENDING   Incomplete      Discharge Instructions     Follow with Primary MD Scarlette Calico, MD, MD in 4 days   Get CBC, CMP, checked 4 days by Primary MD and again as instructed by your Primary MD. Get a 2 view Chest X ray done next visit.  Get Medicines reviewed and adjusted.  Accuchecks 4 times/day, Once in AM empty stomach and then before each meal. Log in all results and show them to your Prim.MD in 3 days. If any glucose reading is under 80 or above 300 call your Prim MD immidiately. Follow Low glucose instructions for glucose under 80 as instructed.  Please request your Prim.MD to go over all Hospital Tests and Procedure/Radiological results at the follow up, please get all Hospital records sent to your Prim MD by signing hospital release before you go home.  Activity: Fall precautions use walker/cane & assistance as needed  Diet: Heart Healthy - Diabetic, Aspiration precautions.  For Heart failure patients - Check your Weight same time everyday, if you gain over 2 pounds, or you develop in leg swelling, experience more shortness of breath or chest pain, call your Primary MD immediately. Follow Cardiac Low Salt Diet and 1.8 lit/day fluid restriction.  Disposition Home  If you experience worsening of your admission symptoms, develop shortness of breath, life threatening emergency, suicidal or homicidal thoughts you must seek medical  attention immediately by calling 911 or calling your MD immediately  if symptoms less severe.  You Must read complete instructions/literature along with all the possible adverse reactions/side effects for all the Medicines you take and that have been prescribed to you. Take any new Medicines after you have completely understood and accpet all the possible adverse reactions/side effects.   Do not drive if your were admitted for syncope or siezures until you have seen by Primary MD or a Neurologist and advised to drive.  Do not drive when taking Pain medications.    Do not take more than prescribed Pain, Sleep and Anxiety Medications  Special Instructions: If you have smoked or chewed Tobacco  in the last 2 yrs please stop smoking, stop any regular Alcohol  and or any Recreational drug use.  Wear Seat belts while driving.   Follow-up Information    Follow up with Scarlette Calico, MD. Schedule an appointment as soon as possible for a visit in 4 days.      Follow up with Jenkins Rouge, MD. Schedule an appointment as soon as possible for a visit in 1 week.   Contact information:   [id number], [place] [phone]          Discharge Medications   Medication List  As of 04/24/2011  1:58 PM   START taking these medications         bisacodyl 10 MG suppository   Commonly known as: DULCOLAX   Place 1 suppository (10 mg total) rectally daily as needed.      levofloxacin 250 MG tablet   Commonly known as: LEVAQUIN   Take 1 tablet (250 mg total) by mouth daily.      senna-docusate 8.6-50 MG per tablet   Commonly known as: Senokot-S   Take 1 tablet by mouth 2 (two) times daily as needed for constipation.         CONTINUE taking these medications         amLODipine 10 MG tablet   Commonly known as: NORVASC      aspirin 81 MG tablet      clopidogrel 75 MG tablet   Commonly known as: PLAVIX      hydrochlorothiazide  12.5 MG capsule   Commonly known as: MICROZIDE      metFORMIN 500 MG tablet   Commonly known as: GLUCOPHAGE      PRODIGY NO CODING BLOOD GLUC test strip   Generic drug: glucose blood   USE TO CHECK BLOOD SUGAR TWICE DAILY      PRODIGY TWIST TOP LANCETS 28G Misc   USE TO CHECK BLOOD SUGAR TWICE DAILY      rosuvastatin 20 MG tablet   Commonly known as: CRESTOR      SYSTANE OP      Tamsulosin HCl 0.4 MG Caps   Commonly known as: FLOMAX         STOP taking these medications         benazepril 10 MG tablet      glipiZIDE 5 MG tablet          Where to get your medications    These are the prescriptions that you need to pick up.   You may get these medications from any pharmacy.         bisacodyl 10 MG suppository   levofloxacin 250 MG tablet   senna-docusate 8.6-50 MG per tablet             Total Time in preparing paper work, data evaluation and todays  exam - 35 minutes  Thurnell Lose M.D on 04/24/2011 at 1:58 PM  [place]  [phone]

## 2011-04-24 NOTE — Progress Notes (Signed)
Physical Therapy Treatment Patient Details Name: Michelle Lopez MRN: [id number] DOB: 22-Nov-1945 Today's Date: 04/24/2011  PT Assessment/Plan  PT - Assessment/Plan Comments on Treatment Session: Pt reports she feels weak, especially in her legs with walking. She states her daughter can assist her at home. PT now recommending RW for home. Pt did well with ambulation today. Pt reports baseline dizziness when walking, she states she's had dizziness since having multiple strokes. PT Plan: Discharge plan remains appropriate PT Frequency: Min 3X/week Follow Up Recommendations: Home health PT Equipment Recommended: Rolling walker with 5" wheels PT Goals  Acute Rehab PT Goals PT Goal Formulation: With patient Time For Goal Achievement: 7 days Pt will go Sit to Stand: with modified independence PT Goal: Sit to Stand - Progress: Progressing toward goal Pt will go Stand to Sit: with modified independence PT Goal: Stand to Sit - Progress: Progressing toward goal Pt will Ambulate: >150 feet;with modified independence;with least restrictive assistive device PT Goal: Ambulate - Progress: Progressing toward goal Pt will Perform Home Exercise Program: with supervision, verbal cues required/provided PT Goal: Perform Home Exercise Program - Progress: Not met  PT Treatment Precautions/Restrictions  Restrictions Weight Bearing Restrictions: No Mobility (including Balance) Bed Mobility Bed Mobility: Yes Supine to Sit: 6: Modified independent (Device/Increase time);With rails;HOB flat Transfers Transfers: Yes Sit to Stand: 5: Supervision;From bed;With upper extremity assist;With armrests Sit to Stand Details (indicate cue type and reason): VCs for hand placement Stand to Sit: 5: Supervision;With upper extremity assist;To chair/3-in-1 Stand to Sit Details: VCs for hand placement Ambulation/Gait Ambulation/Gait: Yes Ambulation/Gait Assistance: 5: Supervision Ambulation/Gait Assistance Details (indicate  cue type and reason): Initially pt ambulated with [facility] for 15' but was unsteady so switched to using RW. No LOB with RW.  Ambulation Distance (Feet): 120 Feet Assistive device: Rolling walker Gait Pattern: Within Functional Limits    Exercise    End of Session PT - End of Session Equipment Utilized During Treatment: Gait belt Activity Tolerance: Patient tolerated treatment well Patient left: in chair;with call bell in reach Nurse Communication: Mobility status for transfers;Mobility status for ambulation General Behavior During Session: [facility] for tasks performed Cognition: [facility] for tasks performed  Philomena Doheny 04/24/2011, 11:55 AM

## 2011-04-24 NOTE — Progress Notes (Signed)
Tap water enema done with positive effect, hard small stools in large amount,MD aware.

## 2011-04-24 NOTE — Progress Notes (Signed)
Discharge to home accompanied by daughter(Andrea) alert and oriented, no complaints of any pain or discomfort. D/c instructions  and follow up appointments done and discussed with patient and the daughter, verbalized understanding.

## 2011-04-24 NOTE — Progress Notes (Signed)
04-24-11 Spoke with Jackelyn Poling with [facility] to make aware of the need for RW. To bring before dtr picks up patient. Sticky note left for nursing. Mrs Bhullar made aware as well.  [place], [place]  [phone]

## 2011-04-24 NOTE — Discharge Instructions (Signed)
Follow with Primary MD Scarlette Calico, MD, MD in 4 days   Get CBC, CMP, checked 4 days by Primary MD and again as instructed by your Primary MD. Get a 2 view Chest X ray done next visit.  Get Medicines reviewed and adjusted.  Accuchecks 4 times/day, Once in AM empty stomach and then before each meal. Log in all results and show them to your Prim.MD in 3 days. If any glucose reading is under 80 or above 300 call your Prim MD immidiately. Follow Low glucose instructions for glucose under 80 as instructed.  Please request your Prim.MD to go over all Hospital Tests and Procedure/Radiological results at the follow up, please get all Hospital records sent to your Prim MD by signing hospital release before you go home.  Activity: Fall precautions use walker/cane & assistance as needed  Diet: Heart Healthy - Diabetic, Aspiration precautions.  For Heart failure patients - Check your Weight same time everyday, if you gain over 2 pounds, or you develop in leg swelling, experience more shortness of breath or chest pain, call your Primary MD immediately. Follow Cardiac Low Salt Diet and 1.8 lit/day fluid restriction.  Disposition Home  If you experience worsening of your admission symptoms, develop shortness of breath, life threatening emergency, suicidal or homicidal thoughts you must seek medical attention immediately by calling 911 or calling your MD immediately  if symptoms less severe.  You Must read complete instructions/literature along with all the possible adverse reactions/side effects for all the Medicines you take and that have been prescribed to you. Take any new Medicines after you have completely understood and accpet all the possible adverse reactions/side effects.   Do not drive if your were admitted for syncope or siezures until you have seen by Primary MD or a Neurologist and advised to drive.  Do not drive when taking Pain medications.    Do not take more than prescribed Pain,  Sleep and Anxiety Medications  Special Instructions: If you have smoked or chewed Tobacco  in the last 2 yrs please stop smoking, stop any regular Alcohol  and or any Recreational drug use.  Wear Seat belts while driving.

## 2011-04-26 DIAGNOSIS — IMO0001 Reserved for inherently not codable concepts without codable children: Secondary | ICD-10-CM | POA: Diagnosis not present

## 2011-04-26 DIAGNOSIS — H814 Vertigo of central origin: Secondary | ICD-10-CM | POA: Diagnosis not present

## 2011-04-26 DIAGNOSIS — I69959 Hemiplegia and hemiparesis following unspecified cerebrovascular disease affecting unspecified side: Secondary | ICD-10-CM | POA: Diagnosis not present

## 2011-04-26 DIAGNOSIS — M6281 Muscle weakness (generalized): Secondary | ICD-10-CM | POA: Diagnosis not present

## 2011-04-27 LAB — CULTURE, BLOOD (ROUTINE X 2)
Culture  Setup Time: 201303150129
Culture: NO GROWTH
Culture: NO GROWTH

## 2011-05-02 ENCOUNTER — Other Ambulatory Visit (INDEPENDENT_AMBULATORY_CARE_PROVIDER_SITE_OTHER): Payer: Medicare Other

## 2011-05-02 ENCOUNTER — Ambulatory Visit (INDEPENDENT_AMBULATORY_CARE_PROVIDER_SITE_OTHER): Payer: Medicare Other | Admitting: Internal Medicine

## 2011-05-02 ENCOUNTER — Encounter: Payer: Self-pay | Admitting: Internal Medicine

## 2011-05-02 ENCOUNTER — Encounter: Payer: Medicare Other | Admitting: Physician Assistant

## 2011-05-02 VITALS — BP 138/70 | HR 81 | Temp 97.8°F | Resp 16

## 2011-05-02 DIAGNOSIS — IMO0001 Reserved for inherently not codable concepts without codable children: Secondary | ICD-10-CM

## 2011-05-02 DIAGNOSIS — E785 Hyperlipidemia, unspecified: Secondary | ICD-10-CM

## 2011-05-02 DIAGNOSIS — N390 Urinary tract infection, site not specified: Secondary | ICD-10-CM | POA: Diagnosis not present

## 2011-05-02 DIAGNOSIS — F068 Other specified mental disorders due to known physiological condition: Secondary | ICD-10-CM | POA: Diagnosis not present

## 2011-05-02 DIAGNOSIS — F0390 Unspecified dementia without behavioral disturbance: Secondary | ICD-10-CM

## 2011-05-02 DIAGNOSIS — Z0000 Encounter for general adult medical examination without abnormal findings: Secondary | ICD-10-CM

## 2011-05-02 LAB — URINALYSIS, ROUTINE W REFLEX MICROSCOPIC
Bilirubin Urine: NEGATIVE
Ketones, ur: NEGATIVE
Urine Glucose: NEGATIVE
Urobilinogen, UA: 0.2 (ref 0.0–1.0)

## 2011-05-02 NOTE — Progress Notes (Signed)
Subjective:    Patient ID: Michelle Lopez, female    DOB: 15-Dec-1945, 66 y.o.   MRN: [id number]  HPI  She returns for f/up and was recently admitted to the hosp for treatment of a uti. She has no urinary s/s today. While in the hospital she had some SOB and tells me that she saw Dr. Mare Ferrari and that she had a f/up with him today. She is very forgetful today and does not remember any details about the admission though she tells me that she had a brain scan done. She reports an achy pain in her arms and legs and diffuse weakness today.  Review of Systems  Constitutional: Negative for fever, chills, diaphoresis, activity change, appetite change, fatigue and unexpected weight change.  HENT: Negative.   Eyes: Negative.   Respiratory: Positive for shortness of breath. Negative for apnea, cough, choking, chest tightness, wheezing and stridor.   Cardiovascular: Negative for chest pain, palpitations and leg swelling.  Gastrointestinal: Negative for abdominal pain, diarrhea, constipation, blood in stool, abdominal distention, anal bleeding and rectal pain.  Genitourinary: Negative.   Musculoskeletal: Positive for myalgias. Negative for back pain, joint swelling, arthralgias and gait problem.  Skin: Negative for color change, pallor, rash and wound.  Neurological: Positive for dizziness. Negative for tremors, seizures, syncope, facial asymmetry, speech difficulty, weakness, light-headedness, numbness and headaches.  Psychiatric/Behavioral: Positive for confusion and decreased concentration. Negative for suicidal ideas, hallucinations, behavioral problems, sleep disturbance, self-injury, dysphoric mood and agitation. The patient is not nervous/anxious and is not hyperactive.        Objective:   Physical Exam  Vitals reviewed. Constitutional: She is oriented to person, place, and time. She appears well-developed and well-nourished. No distress.  HENT:  Head: Normocephalic and atraumatic.    Mouth/Throat: Oropharynx is clear and moist.  Eyes: Conjunctivae are normal. Right eye exhibits no discharge. Left eye exhibits no discharge. No scleral icterus.  Neck: Normal range of motion. Neck supple. No JVD present. No tracheal deviation present. No thyromegaly present.  Cardiovascular: Normal rate, regular rhythm, normal heart sounds and intact distal pulses.  Exam reveals no gallop and no friction rub.   No murmur heard. Pulmonary/Chest: Effort normal and breath sounds normal. No stridor. No respiratory distress. She has no wheezes. She has no rales. She exhibits no tenderness.  Abdominal: Soft. Bowel sounds are normal. She exhibits no distension and no mass. There is no tenderness. There is no rebound and no guarding.  Musculoskeletal: Normal range of motion. She exhibits no edema and no tenderness.  Lymphadenopathy:    She has no cervical adenopathy.  Neurological: She is oriented to person, place, and time.  Skin: Skin is warm and dry. No rash noted. She is not diaphoretic. No erythema. No pallor.  Psychiatric: She has a normal mood and affect. Her speech is normal. Judgment and thought content normal. Her mood appears not anxious. Her affect is not angry, not blunt, not labile and not inappropriate. She is slowed and withdrawn. She is not agitated, not aggressive, is not hyperactive, not actively hallucinating and not combative. Thought content is not paranoid and not delusional. Cognition and memory are impaired. She does not express impulsivity or inappropriate judgment. She does not exhibit a depressed mood. She expresses no homicidal and no suicidal ideation. She expresses no suicidal plans and no homicidal plans. She exhibits abnormal recent memory and abnormal remote memory. She is inattentive.      Lab Results  Component Value Date   WBC 4.8 04/23/2011  HGB 11.5* 04/23/2011   HCT 36.8 04/23/2011   PLT 217 04/23/2011   GLUCOSE 118* 04/23/2011   CHOL 258* 09/15/2010   TRIG  194.0* 09/15/2010   HDL 43.30 09/15/2010   LDLDIRECT 200.2 09/15/2010   LDLCALC 111* 06/24/2009   ALT 6 04/20/2011   AST 14 04/20/2011   NA 135 04/23/2011   K 3.8 04/23/2011   CL 99 04/23/2011   CREATININE 1.14* 04/24/2011   BUN 18 04/23/2011   CO2 26 04/23/2011   TSH 0.595 04/20/2011   INR 1.07 04/20/2011   HGBA1C 7.1* 04/20/2011   MICROALBUR 1.95* 02/09/2009      Assessment & Plan:

## 2011-05-02 NOTE — Telephone Encounter (Signed)
This encounter was created in error - please disregard.

## 2011-05-02 NOTE — Assessment & Plan Note (Signed)
I have asked her to see neurology to evaluate this

## 2011-05-02 NOTE — Assessment & Plan Note (Signed)
She has myalgias and weakness so I have asked her to stop the crestor to see if her symptoms improve

## 2011-05-02 NOTE — Progress Notes (Signed)
Addended by: Janith Lima on: 05/02/2011 06:46 PM   Modules accepted: Orders

## 2011-05-02 NOTE — Assessment & Plan Note (Signed)
Her recent a1c looks good

## 2011-05-02 NOTE — Patient Instructions (Signed)
Hypercholesterolemia High Blood Cholesterol Cholesterol is a white, waxy, fat-like protein needed by your body in small amounts. The liver makes all the cholesterol you need. It is carried from the liver by the blood through the blood vessels. Deposits (plaque) may build up on blood vessel walls. This makes the arteries narrower and stiffer. Plaque increases the risk for heart attack and stroke. You cannot feel your cholesterol level even if it is very high. The only way to know is by a blood test to check your lipid (fats) levels. Once you know your cholesterol levels, you should keep a record of the test results. Work with your caregiver to to keep your levels in the desired range. WHAT THE RESULTS MEAN:  Total cholesterol is a rough measure of all the cholesterol in your blood.   LDL is the so-called bad cholesterol. This is the type that deposits cholesterol in the walls of the arteries. You want this level to be low.   HDL is the good cholesterol because it cleans the arteries and carries the LDL away. You want this level to be high.   Triglycerides are fat that the body can either burn for energy or store. High levels are closely linked to heart disease.  DESIRED LEVELS:  Total cholesterol below 200.   LDL below 100 for people at risk, below 70 for very high risk.   HDL above 50 is good, above 60 is best.   Triglycerides below 150.  HOW TO LOWER YOUR CHOLESTEROL:  Diet.   Choose fish or white meat chicken and [place], roasted or baked. Limit fatty cuts of red meat, fried foods, and processed meats, such as sausage and lunch meat.   Eat lots of fresh fruits and vegetables. Choose whole grains, beans, pasta, potatoes and cereals.   Use only small amounts of olive, corn or canola oils. Avoid butter, mayonnaise, shortening or palm kernel oils. Avoid foods with trans-fats.   Use skim/nonfat milk and low-fat/nonfat yogurt and cheeses. Avoid whole milk, cream, ice cream, egg yolks and  cheeses. Healthy desserts include angel food cake, gingersnaps, animal crackers, hard candy, popsicles, and low-fat/nonfat frozen yogurt. Avoid pastries, cakes, pies and cookies.   Exercise.   A regular program helps decrease LDL and raises HDL.   Helps with weight control.   Do things that increase your activity level like gardening, walking, or taking the stairs.   Medication.   May be prescribed by your caregiver to help lowering cholesterol and the risk for heart disease.   You may need medicine even if your levels are normal if you have several risk factors.  HOME CARE INSTRUCTIONS   Follow your diet and exercise programs as suggested by your caregiver.   Take medications as directed.   Have blood work done when your caregiver feels it is necessary.  MAKE SURE YOU:   Understand these instructions.   Will watch your condition.   Will get help right away if you are not doing well or get worse.  Document Released: 01/23/2005 Document Revised: 01/12/2011 Document Reviewed: 07/11/2006 [facility] Patient Information 2012 [place].Urinary Tract Infection Infections of the urinary tract can start in several places. A bladder infection (cystitis), a kidney infection (pyelonephritis), and a prostate infection (prostatitis) are different types of urinary tract infections (UTIs). They usually get better if treated with medicines (antibiotics) that kill germs. Take all the medicine until it is gone. You or your child may feel better in a few days, but TAKE ALL MEDICINE  or the infection may not respond and may become more difficult to treat. HOME CARE INSTRUCTIONS   Drink enough water and fluids to keep the urine clear or pale yellow. Cranberry juice is especially recommended, in addition to large amounts of water.   Avoid caffeine, tea, and carbonated beverages. They tend to irritate the bladder.   Alcohol may irritate the prostate.   Only take over-the-counter or prescription  medicines for pain, discomfort, or fever as directed by your caregiver.  To prevent further infections:  Empty the bladder often. Avoid holding urine for long periods of time.   After a bowel movement, women should cleanse from front to back. Use each tissue only once.   Empty the bladder before and after sexual intercourse.  FINDING OUT THE RESULTS OF YOUR TEST Not all test results are available during your visit. If your or your child's test results are not back during the visit, make an appointment with your caregiver to find out the results. Do not assume everything is normal if you have not heard from your caregiver or the medical facility. It is important for you to follow up on all test results. SEEK MEDICAL CARE IF:   There is back pain.   Your baby is older than 3 months with a rectal temperature of 100.5 F (38.1 C) or higher for more than 1 day.   Your or your child's problems (symptoms) are no better in 3 days. Return sooner if you or your child is getting worse.  SEEK IMMEDIATE MEDICAL CARE IF:   There is severe back pain or lower abdominal pain.   You or your child develops chills.   You have a fever.   Your baby is older than 3 months with a rectal temperature of 102 F (38.9 C) or higher.   Your baby is 53 months old or younger with a rectal temperature of 100.4 F (38 C) or higher.   There is nausea or vomiting.   There is continued burning or discomfort with urination.  MAKE SURE YOU:   Understand these instructions.   Will watch your condition.   Will get help right away if you are not doing well or get worse.  Document Released: 11/02/2004 Document Revised: 01/12/2011 Document Reviewed: 06/07/2006 [facility] Patient Information 2012 [place].

## 2011-05-02 NOTE — Assessment & Plan Note (Signed)
I will recheck her UA and culture today

## 2011-05-03 ENCOUNTER — Ambulatory Visit (INDEPENDENT_AMBULATORY_CARE_PROVIDER_SITE_OTHER): Payer: Medicare Other | Admitting: Cardiology

## 2011-05-03 ENCOUNTER — Encounter: Payer: Self-pay | Admitting: Cardiology

## 2011-05-03 VITALS — BP 110/70 | HR 78 | Ht 60.0 in | Wt 134.0 lb

## 2011-05-03 DIAGNOSIS — I119 Hypertensive heart disease without heart failure: Secondary | ICD-10-CM

## 2011-05-03 DIAGNOSIS — E119 Type 2 diabetes mellitus without complications: Secondary | ICD-10-CM | POA: Diagnosis not present

## 2011-05-03 DIAGNOSIS — I639 Cerebral infarction, unspecified: Secondary | ICD-10-CM

## 2011-05-03 DIAGNOSIS — R42 Dizziness and giddiness: Secondary | ICD-10-CM | POA: Diagnosis not present

## 2011-05-03 DIAGNOSIS — I10 Essential (primary) hypertension: Secondary | ICD-10-CM

## 2011-05-03 DIAGNOSIS — I6350 Cerebral infarction due to unspecified occlusion or stenosis of unspecified cerebral artery: Secondary | ICD-10-CM

## 2011-05-03 DIAGNOSIS — E785 Hyperlipidemia, unspecified: Secondary | ICD-10-CM

## 2011-05-03 NOTE — Assessment & Plan Note (Signed)
The patient has a history of hyperlipidemia.  She is not currently on any lipid-lowering agents.  In the past there has been problems with medication compliance.

## 2011-05-03 NOTE — Assessment & Plan Note (Signed)
The patient has a history of diabetes mellitus and is on metformin 1000 mg twice a day.  She denies any symptoms of hypoglycemia.

## 2011-05-03 NOTE — Patient Instructions (Signed)
Your physician recommends that you continue on your current medications as directed. Please refer to the Current Medication list given to you today.  Your physician recommends you follow-up with Dr. Mare Ferrari as needed.

## 2011-05-03 NOTE — Progress Notes (Signed)
Michelle Lopez Date of Birth:  November 22, 1945 [facility] [address] [place], [place]  [address] [phone]         Fax   [phone]  History of Present Illness: This pleasant 66 year old woman is seen by me for the first time today.  She was recently hospitalized for dizziness and weakness and cocaine abuse.  While in the hospital she had an electrocardiogram which showed some nonspecific ST-T wave changes and occasional premature ventricular beats.  In the hospital she had a echocardiogram on 04/21/10 which showed normal left ventricular systolic function with an ejection fraction of 65-70%, mild LVH, and grade 1 diastolic dysfunction.  She had a chest x-ray in the hospital which was normal.  She was advised at the time of discharge to have cardiology followup.  Current Outpatient Prescriptions  Medication Sig Dispense Refill  . amLODipine (NORVASC) 10 MG tablet Take 10 mg by mouth daily.        Marland Kitchen aspirin 81 MG tablet Take 81 mg by mouth daily.        . benazepril (LOTENSIN) 10 MG tablet Take 10 mg by mouth daily.      . clopidogrel (PLAVIX) 75 MG tablet Take 75 mg by mouth daily.      . hydrochlorothiazide (MICROZIDE) 12.5 MG capsule Take 12.5 mg by mouth daily.      . metFORMIN (GLUCOPHAGE) 500 MG tablet Take 1,000 mg by mouth 2 (two) times daily with a meal.      . PRODIGY TWIST TOP LANCETS 28G MISC USE TO CHECK BLOOD SUGAR TWICE DAILY  100 each  PRN  . Tamsulosin HCl (FLOMAX) 0.4 MG CAPS Take 0.4 mg by mouth daily.      Marland Kitchen PRODIGY NO CODING BLOOD GLUC test strip USE TO CHECK BLOOD SUGAR TWICE DAILY  100 each  PRN    Allergies  Allergen Reactions  . Crestor (Rosuvastatin Calcium)     myalgias  . Morphine Hives  . Pravastatin Sodium Rash  . Simvastatin Rash    Patient Active Problem List  Diagnoses  . DIABETES MELLITUS, TYPE II, UNCONTROLLED  . DEPRESSION  . VERTIGO, CENTRAL  . Essential hypertension, benign  . OSTEOARTHRITIS, HIP, RIGHT  . INSOMNIA    . LEFT ACUTE POSTERIOR CIRCULATION INFARCT  . Other screening mammogram  . Hyperlipidemia  . Cocaine abuse, unspecified  . UTI (lower urinary tract infection)  . Dementia arising in the senium and presenium  . Routine general medical examination at a health care facility    History  Smoking status  . Never Smoker   Smokeless tobacco  . Never Used    History  Alcohol Use No    Family History  Problem Relation Age of Onset  . Diabetes Other   . Stroke Other     Review of Systems: Constitutional: no fever chills diaphoresis or fatigue or change in weight.  Head and neck: no hearing loss, no epistaxis, no photophobia or visual disturbance. Respiratory: No cough, shortness of breath or wheezing. Cardiovascular: No chest pain peripheral edema, palpitations. Gastrointestinal: No abdominal distention, no abdominal pain, no change in bowel habits hematochezia or melena. Genitourinary: No dysuria, no frequency, no urgency, no nocturia. Musculoskeletal:No arthralgias, no back pain, no gait disturbance or myalgias. Neurological: No dizziness, no headaches, no numbness, no seizures, no syncope, no weakness, no tremors. Hematologic: No lymphadenopathy, no easy bruising. Psychiatric: No confusion, no hallucinations, no sleep disturbance.    Physical Exam: Filed Vitals:   05/03/11 1207  BP: 110/70  Pulse: 78   the general appearance reveals a well-developed well-nourished older woman in no acute distress.  She has poor balance and walks with a cane and a very slow gait.Pupils equal and reactive.   Extraocular Movements are full.  There is no scleral icterus.  The mouth and pharynx are normal.  The neck is supple.  The carotids reveal no bruits.  The jugular venous pressure is normal.  The thyroid is not enlarged.  There is no lymphadenopathy.  The chest is clear to percussion and auscultation. There are no rales or rhonchi. Expansion of the chest is symmetrical.  The precordium is  quiet.  The first heart sound is normal.  The second heart sound is physiologically split.  There is no murmur gallop rub or click.  There is no abnormal lift or heave.  The abdomen is soft and nontender. Bowel sounds are normal. The liver and spleen are not enlarged. There Are no abdominal masses. There are no bruits.  The pedal pulses are good.  There is no phlebitis or edema.  There is no cyanosis or clubbing. The skin is warm and dry.  There is no rash.  EKG today shows normal sinus rhythm and no premature beats.  There are no ischemic changes.  Since her previous tracing of 04/20/11, sinus tachycardia and PVCs are no longer present.   Assessment / Plan: At this time there are no active cardiology issues present.  She has not had any chest pain or shortness of breath or palpitations.  Her electrocardiogram has returned to a stable baseline.  Her main problem is that of poor balance and poor equilibrium and for this she will follow up with her primary care physician and is awaiting her neurology outpatient appointment.  We will see her back here on a when necessary basis.

## 2011-05-03 NOTE — Assessment & Plan Note (Signed)
The patient has a history of essential hypertension.  He denies any history of chest pain or shortness of breath.  She has not been aware of any palpitations.  She has had dizziness related to problems with her balance and she has been scheduled to see neurology on an outpatient basis.  Dr. Eilleen Kempf is her primary care physician.

## 2011-05-06 LAB — CULTURE, URINE COMPREHENSIVE: Colony Count: 75000

## 2011-05-09 ENCOUNTER — Encounter: Payer: Self-pay | Admitting: Neurology

## 2011-05-11 DIAGNOSIS — IMO0001 Reserved for inherently not codable concepts without codable children: Secondary | ICD-10-CM

## 2011-05-11 DIAGNOSIS — M6281 Muscle weakness (generalized): Secondary | ICD-10-CM

## 2011-05-11 DIAGNOSIS — H814 Vertigo of central origin: Secondary | ICD-10-CM

## 2011-05-11 DIAGNOSIS — I69959 Hemiplegia and hemiparesis following unspecified cerebrovascular disease affecting unspecified side: Secondary | ICD-10-CM

## 2011-05-16 ENCOUNTER — Other Ambulatory Visit: Payer: Self-pay | Admitting: Internal Medicine

## 2011-05-16 DIAGNOSIS — N63 Unspecified lump in unspecified breast: Secondary | ICD-10-CM

## 2011-05-23 ENCOUNTER — Ambulatory Visit: Payer: Medicare Other | Admitting: Internal Medicine

## 2011-05-30 ENCOUNTER — Ambulatory Visit (INDEPENDENT_AMBULATORY_CARE_PROVIDER_SITE_OTHER): Payer: Medicare Other | Admitting: Internal Medicine

## 2011-05-30 ENCOUNTER — Other Ambulatory Visit (INDEPENDENT_AMBULATORY_CARE_PROVIDER_SITE_OTHER): Payer: Medicare Other

## 2011-05-30 ENCOUNTER — Encounter: Payer: Self-pay | Admitting: Internal Medicine

## 2011-05-30 VITALS — BP 136/90 | HR 80 | Temp 97.8°F | Resp 16 | Wt 138.8 lb

## 2011-05-30 DIAGNOSIS — E785 Hyperlipidemia, unspecified: Secondary | ICD-10-CM

## 2011-05-30 DIAGNOSIS — I10 Essential (primary) hypertension: Secondary | ICD-10-CM | POA: Diagnosis not present

## 2011-05-30 DIAGNOSIS — Z8679 Personal history of other diseases of the circulatory system: Secondary | ICD-10-CM | POA: Diagnosis not present

## 2011-05-30 DIAGNOSIS — IMO0001 Reserved for inherently not codable concepts without codable children: Secondary | ICD-10-CM

## 2011-05-30 DIAGNOSIS — I69991 Dysphagia following unspecified cerebrovascular disease: Secondary | ICD-10-CM

## 2011-05-30 DIAGNOSIS — I69391 Dysphagia following cerebral infarction: Secondary | ICD-10-CM

## 2011-05-30 LAB — COMPREHENSIVE METABOLIC PANEL
ALT: 8 U/L (ref 0–35)
AST: 12 U/L (ref 0–37)
Albumin: 3.9 g/dL (ref 3.5–5.2)
Alkaline Phosphatase: 63 U/L (ref 39–117)
BUN: 19 mg/dL (ref 6–23)
Chloride: 100 mEq/L (ref 96–112)
Creatinine, Ser: 0.9 mg/dL (ref 0.4–1.2)
Potassium: 4.5 mEq/L (ref 3.5–5.1)

## 2011-05-30 LAB — LIPID PANEL
Cholesterol: 268 mg/dL — ABNORMAL HIGH (ref 0–200)
Total CHOL/HDL Ratio: 6
Triglycerides: 234 mg/dL — ABNORMAL HIGH (ref 0.0–149.0)
VLDL: 46.8 mg/dL — ABNORMAL HIGH (ref 0.0–40.0)

## 2011-05-30 LAB — LDL CHOLESTEROL, DIRECT: Direct LDL: 181.2 mg/dL

## 2011-05-30 MED ORDER — COLESEVELAM HCL 3.75 G PO PACK: 1.0000 | PACK | Freq: Every day | ORAL | Status: DC

## 2011-05-30 MED ORDER — OLMESARTAN MEDOXOMIL 40 MG PO TABS: 40.0000 mg | ORAL_TABLET | Freq: Every day | ORAL | Status: DC

## 2011-05-30 MED ORDER — PITAVASTATIN CALCIUM 2 MG PO TABS: 1.0000 | ORAL_TABLET | Freq: Every day | ORAL | Status: DC

## 2011-05-30 NOTE — Patient Instructions (Signed)
Hypercholesterolemia High Blood Cholesterol Cholesterol is a white, waxy, fat-like protein needed by your body in small amounts. The liver makes all the cholesterol you need. It is carried from the liver by the blood through the blood vessels. Deposits (plaque) may build up on blood vessel walls. This makes the arteries narrower and stiffer. Plaque increases the risk for heart attack and stroke. You cannot feel your cholesterol level even if it is very high. The only way to know is by a blood test to check your lipid (fats) levels. Once you know your cholesterol levels, you should keep a record of the test results. Work with your caregiver to to keep your levels in the desired range. WHAT THE RESULTS MEAN:  Total cholesterol is a rough measure of all the cholesterol in your blood.   LDL is the so-called bad cholesterol. This is the type that deposits cholesterol in the walls of the arteries. You want this level to be low.   HDL is the good cholesterol because it cleans the arteries and carries the LDL away. You want this level to be high.   Triglycerides are fat that the body can either burn for energy or store. High levels are closely linked to heart disease.  DESIRED LEVELS:  Total cholesterol below 200.   LDL below 100 for people at risk, below 70 for very high risk.   HDL above 50 is good, above 60 is best.   Triglycerides below 150.  HOW TO LOWER YOUR CHOLESTEROL:  Diet.   Choose fish or white meat chicken and turkey, roasted or baked. Limit fatty cuts of red meat, fried foods, and processed meats, such as sausage and lunch meat.   Eat lots of fresh fruits and vegetables. Choose whole grains, beans, pasta, potatoes and cereals.   Use only small amounts of olive, corn or canola oils. Avoid butter, mayonnaise, shortening or palm kernel oils. Avoid foods with trans-fats.   Use skim/nonfat milk and low-fat/nonfat yogurt and cheeses. Avoid whole milk, cream, ice cream, egg yolks and  cheeses. Healthy desserts include angel food cake, gingersnaps, animal crackers, hard candy, popsicles, and low-fat/nonfat frozen yogurt. Avoid pastries, cakes, pies and cookies.   Exercise.   A regular program helps decrease LDL and raises HDL.   Helps with weight control.   Do things that increase your activity level like gardening, walking, or taking the stairs.   Medication.   May be prescribed by your caregiver to help lowering cholesterol and the risk for heart disease.   You may need medicine even if your levels are normal if you have several risk factors.  HOME CARE INSTRUCTIONS   Follow your diet and exercise programs as suggested by your caregiver.   Take medications as directed.   Have blood work done when your caregiver feels it is necessary.  MAKE SURE YOU:   Understand these instructions.   Will watch your condition.   Will get help right away if you are not doing well or get worse.  Document Released: 01/23/2005 Document Revised: 01/12/2011 Document Reviewed: 07/11/2006 Exit[employer] Patient Information 2012 [employer].Diabetes, Type 2 Diabetes is a long-lasting (chronic) disease. In type 2 diabetes, the pancreas does not make enough insulin (a hormone), and the body does not respond normally to the insulin that is made. This type of diabetes was also previously called adult-onset diabetes. It usually occurs after the age of 40, but it can occur at any age.  CAUSES  Type 2 diabetes happens because the   pancreasis not making enough insulin or your body has trouble using the insulin that your pancreas does make properly. SYMPTOMS   Drinking more than usual.   Urinating more than usual.   Blurred vision.   Dry, itchy skin.   Frequent infections.   Feeling more tired than usual (fatigue).  DIAGNOSIS The diagnosis of type 2 diabetes is usually made by one of the following tests:  Fasting blood glucose test. You will not eat for at least 8 hours and then  take a blood test.   Random blood glucose test. Your blood glucose (sugar) is checked at any time of the day regardless of when you ate.   Oral glucose tolerance test (OGTT). Your blood glucose is measured after you have not eaten (fasted) and then after you drink a glucose containing beverage.  TREATMENT   Healthy eating.   Exercise.   Medicine, if needed.   Monitoring blood glucose.   Seeing your caregiver regularly.  HOME CARE INSTRUCTIONS   Check your blood glucose at least once a day. More frequent monitoring may be necessary, depending on your medicines and on how well your diabetes is controlled. Your caregiver will advise you.   Take your medicine as directed by your caregiver.   Do not smoke.   Make wise food choices. Ask your caregiver for information. Weight loss can improve your diabetes.   Learn about low blood glucose (hypoglycemia) and how to treat it.   Get your eyes checked regularly.   Have a yearly physical exam. Have your blood pressure checked and your blood and urine tested.   Wear a pendant or bracelet saying that you have diabetes.   Check your feet every night for cuts, sores, blisters, and redness. Let your caregiver know if you have any problems.  SEEK MEDICAL CARE IF:   You have problems keeping your blood glucose in target range.   You have problems with your medicines.   You have symptoms of an illness that do not improve after 24 hours.   You have a sore or wound that is not healing.   You notice a change in vision or a new problem with your vision.   You have a fever.  MAKE SURE YOU:  Understand these instructions.   Will watch your condition.   Will get help right away if you are not doing well or get worse.  Document Released: 01/23/2005 Document Revised: 01/12/2011 Document Reviewed: 07/11/2010 ExitCare Patient Information 2012 [employer]. 

## 2011-05-30 NOTE — Assessment & Plan Note (Signed)
She has a high LDL and has had myalgias in the past with statins so today I asked her to start livalo and welchol to lower her LDL and therefore reduce her risk of another CVA

## 2011-05-30 NOTE — Progress Notes (Signed)
Subjective:    Patient ID: Michelle Lopez, female    DOB: Jul 11, 1945, 66 y.o.   MRN: [id number]  Diabetes She presents for her follow-up diabetic visit. She has type 2 diabetes mellitus. Her disease course has been stable. Hypoglycemia symptoms include dizziness. Pertinent negatives for hypoglycemia include no headaches, pallor, seizures, speech difficulty, sweats or tremors. Associated symptoms include fatigue and weakness (all over). Pertinent negatives for diabetes include no blurred vision, no chest pain, no foot paresthesias, no foot ulcerations, no polydipsia, no polyphagia, no polyuria, no visual change and no weight loss. There are no hypoglycemic complications. Symptoms are stable. Diabetic complications include a CVA. Current diabetic treatment includes oral agent (monotherapy). She is compliant with treatment all of the time. Her weight is stable. She is following a generally healthy diet. Meal planning includes avoidance of concentrated sweets. She has had a previous visit with a dietician. She participates in exercise intermittently. There is no change in her home blood glucose trend. An ACE inhibitor/angiotensin II receptor blocker is being taken. She does not see a podiatrist.Eye exam is current.  Hyperlipidemia This is a chronic problem. The current episode started more than 1 year ago. The problem is uncontrolled. Recent lipid tests were reviewed and are variable. Exacerbating diseases include diabetes. She has no history of chronic renal disease, hypothyroidism, liver disease, obesity or nephrotic syndrome. Pertinent negatives include no chest pain, focal sensory loss, focal weakness, leg pain, myalgias or shortness of breath. She is currently on no antihyperlipidemic treatment. The current treatment provides no improvement of lipids. Compliance problems include medication side effects.   Hypertension This is a chronic problem. The current episode started more than 1 year ago. The problem is  unchanged. The problem is uncontrolled. Pertinent negatives include no anxiety, blurred vision, chest pain, headaches, malaise/fatigue, neck pain, orthopnea, palpitations, peripheral edema, PND, shortness of breath or sweats. Past treatments include nothing. Compliance problems include medication side effects (she thinks amlo and benaze caused dizziness).  Hypertensive end-organ damage includes CVA. There is no history of chronic renal disease.      Review of Systems  Constitutional: Positive for fatigue. Negative for fever, chills, weight loss, malaise/fatigue, diaphoresis, activity change, appetite change and unexpected weight change.  HENT: Positive for trouble swallowing. Negative for sore throat, facial swelling, sneezing, drooling, mouth sores, neck pain, dental problem, voice change and sinus pressure.   Eyes: Negative.  Negative for blurred vision.  Respiratory: Negative for apnea, cough, choking, chest tightness, shortness of breath, wheezing and stridor.   Cardiovascular: Negative for chest pain, palpitations, orthopnea, leg swelling and PND.  Gastrointestinal: Negative for nausea, vomiting, abdominal pain, diarrhea and constipation.  Genitourinary: Negative.  Negative for polyuria.  Musculoskeletal: Negative for myalgias, back pain, joint swelling, arthralgias and gait problem.  Skin: Negative for color change, pallor, rash and wound.  Neurological: Positive for dizziness and weakness (all over). Negative for tremors, focal weakness, seizures, syncope, facial asymmetry, speech difficulty, light-headedness, numbness and headaches.  Hematological: Negative for polydipsia, polyphagia and adenopathy. Does not bruise/bleed easily.       Objective:   Physical Exam  Vitals reviewed. Constitutional: She is oriented to person, place, and time. She appears well-developed and well-nourished. No distress.  HENT:  Head: Normocephalic and atraumatic.  Mouth/Throat: Oropharynx is clear and  moist. No oropharyngeal exudate.  Eyes: Conjunctivae are normal. Right eye exhibits no discharge. Left eye exhibits no discharge. No scleral icterus.  Neck: Normal range of motion. Neck supple. No JVD present. No tracheal deviation  present. No thyromegaly present.  Cardiovascular: Normal rate, regular rhythm, normal heart sounds and intact distal pulses.  Exam reveals no gallop and no friction rub.   No murmur heard. Pulmonary/Chest: Effort normal and breath sounds normal. No stridor. No respiratory distress. She has no wheezes. She has no rales. She exhibits no tenderness.  Abdominal: Soft. Bowel sounds are normal. She exhibits no distension and no mass. There is no tenderness. There is no rebound and no guarding.  Musculoskeletal: Normal range of motion. She exhibits no edema and no tenderness.  Lymphadenopathy:    She has no cervical adenopathy.  Neurological: She is oriented to person, place, and time.  Skin: Skin is warm and dry. No rash noted. She is not diaphoretic. No erythema. No pallor.  Psychiatric: She has a normal mood and affect. Her behavior is normal. Judgment and thought content normal.     Lab Results  Component Value Date   WBC 4.8 04/23/2011   HGB 11.5* 04/23/2011   HCT 36.8 04/23/2011   PLT 217 04/23/2011   GLUCOSE 118* 04/23/2011   CHOL 258* 09/15/2010   TRIG 194.0* 09/15/2010   HDL 43.30 09/15/2010   LDLDIRECT 200.2 09/15/2010   LDLCALC 111* 06/24/2009   ALT 6 04/20/2011   AST 14 04/20/2011   NA 135 04/23/2011   K 3.8 04/23/2011   CL 99 04/23/2011   CREATININE 1.14* 04/24/2011   BUN 18 04/23/2011   CO2 26 04/23/2011   TSH 0.595 04/20/2011   INR 1.07 04/20/2011   HGBA1C 7.1* 04/20/2011   MICROALBUR 1.95* 02/09/2009       Assessment & Plan:

## 2011-05-30 NOTE — Assessment & Plan Note (Signed)
She feels like her meds caused dizziness so I have changed her to benicar, I will check her lytes and renal function today

## 2011-05-30 NOTE — Assessment & Plan Note (Addendum)
A1c and BMP today  Late note her blood sugars are high so I have asked her to add [place]

## 2011-05-30 NOTE — Assessment & Plan Note (Signed)
Speech therapy consult

## 2011-05-31 ENCOUNTER — Encounter: Payer: Self-pay | Admitting: Internal Medicine

## 2011-05-31 MED ORDER — SITAGLIPTIN PHOSPHATE 100 MG PO TABS: 100.0000 mg | ORAL_TABLET | Freq: Every day | ORAL | Status: DC

## 2011-05-31 NOTE — Progress Notes (Signed)
Addended by: Janith Lima on: 05/31/2011 07:32 AM   Modules accepted: Orders

## 2011-06-23 ENCOUNTER — Ambulatory Visit: Payer: Medicare Other | Attending: Internal Medicine

## 2011-06-23 DIAGNOSIS — IMO0001 Reserved for inherently not codable concepts without codable children: Secondary | ICD-10-CM | POA: Diagnosis not present

## 2011-06-23 DIAGNOSIS — R1310 Dysphagia, unspecified: Secondary | ICD-10-CM | POA: Insufficient documentation

## 2011-06-26 DIAGNOSIS — Z0279 Encounter for issue of other medical certificate: Secondary | ICD-10-CM

## 2011-06-27 ENCOUNTER — Telehealth: Payer: Self-pay

## 2011-06-27 NOTE — Telephone Encounter (Signed)
Pt does not qualify for OT because she is not consider home-bound. Pt agrees that she would be able to go to outpt OT at least once weekly.

## 2011-06-29 ENCOUNTER — Telehealth: Payer: Self-pay | Admitting: Internal Medicine

## 2011-06-29 NOTE — Telephone Encounter (Signed)
Caller: Marian/Child; Phone Number: [phone]; Message from caller: Daughter/ [name] calling wanting to know if Dr.  Ronnald Ramp would approve for a Handicapped Sticker for pt .  She has the paperwork from [facility] and wants to know if pt needs another OV or can she just drop the  form by the office for Dr.  Eliezer Bottom  sign.

## 2011-06-29 NOTE — Telephone Encounter (Signed)
She can drop it off to be signed

## 2011-07-04 ENCOUNTER — Encounter: Payer: Self-pay | Admitting: Neurology

## 2011-07-04 ENCOUNTER — Ambulatory Visit (INDEPENDENT_AMBULATORY_CARE_PROVIDER_SITE_OTHER): Payer: Medicare Other | Admitting: Neurology

## 2011-07-04 VITALS — BP 140/78 | HR 74 | Wt 140.0 lb

## 2011-07-04 DIAGNOSIS — M7500 Adhesive capsulitis of unspecified shoulder: Secondary | ICD-10-CM | POA: Diagnosis not present

## 2011-07-04 NOTE — Patient Instructions (Signed)
We will refer you to physical therapy at [address] in [place]. They will call you to schedule the appointment.  [phone].

## 2011-07-04 NOTE — Progress Notes (Signed)
Dear Dr. Ronnald Ramp,  Thank you for having me see Michelle Lopez in consultation today at [facility] for her problem with memory.  As you may recall, she is a 66 y.o. year old female with a history of diabetes, hypertension, cocaine abuse, multiple ischemic strokes who you were concerned about her memory.  Her daughter who accompanies her has not noticed a change, but the patient does say that her memory is not as good as it once was.  She sometimes forgets tasks that she is about to perform.  Her daughter denies who forgetting conversations or repeating stories.  She lives alone.  She denies hallucinations or changes in her gait.  She denies the current use of cocaine.  She had 3 ischemic strokes, which sound like they were probably in the setting of active cocaine use.  The last one was in 2010.  The involved left paramedian pons, left cerebellum and posterior midbrain, and right pons.  She is also known to have diffuse intracranial atherosclerosis.  She has no carotid stenosis, but does have proximal left vert stenosis with a right vert dominant posterior circulation.   She also has chronic significant microvascular disease on MRI.  The patient has been left with left sided weakness from her strokes.  TTE in 04/2011 revealed an EF of 65-70% with grade 1 diastolic dysfunction, but no valvular disease.  She is maintained on clopidogrel and aspirin.  She has had no new events.    Past Medical History  Diagnosis Date  . Hyperlipidemia   . Diabetes mellitus     type 2, uncontrolled  . Essential hypertension, benign   . Cocaine abuse, unspecified   . Diarrhea   . Cerebrovascular disease, unspecified   - no history of seizures or head trauma.  Past Surgical History  Procedure Date  . Total hip arthroplasty 1970    Dr. Rodell Perna  . Total hip arthroplasty     History   Social History  . Marital Status: Divorced    Spouse Name: N/A    Number of Children: N/A  . Years of Education: N/A    Occupational History  . retired    Social History Main Topics  . Smoking status: Never Smoker   . Smokeless tobacco: Never Used  . Alcohol Use: No  . Drug Use: No  . Sexually Active: Not Currently   Other Topics Concern  . None   Social History Narrative  . None    Family History  Problem Relation Age of Onset  . Diabetes Other   . Stroke Other   - no history of dementia.  Current Outpatient Prescriptions on File Prior to Visit  Medication Sig Dispense Refill  . aspirin 81 MG tablet Take 81 mg by mouth daily.        . clopidogrel (PLAVIX) 75 MG tablet Take 75 mg by mouth daily.      . hydrochlorothiazide (MICROZIDE) 12.5 MG capsule Take 12.5 mg by mouth daily.      . metFORMIN (GLUCOPHAGE) 500 MG tablet Take 1,000 mg by mouth 2 (two) times daily with a meal.      . olmesartan (BENICAR) 40 MG tablet Take 1 tablet (40 mg total) by mouth daily.  90 tablet  3  . Pitavastatin Calcium (LIVALO) 2 MG TABS Take 1 tablet (2 mg total) by mouth daily.  90 tablet  3  . PRODIGY NO CODING BLOOD GLUC test strip USE TO CHECK BLOOD SUGAR TWICE DAILY  100 each  PRN  . PRODIGY TWIST TOP LANCETS 28G MISC USE TO CHECK BLOOD SUGAR TWICE DAILY  100 each  PRN  . sitaGLIPtin (JANUVIA) 100 MG tablet Take 1 tablet (100 mg total) by mouth daily.  90 tablet  1  . Tamsulosin HCl (FLOMAX) 0.4 MG CAPS Take 0.4 mg by mouth daily.      . Colesevelam HCl 3.75 G PACK Take 1 each by mouth daily.  90 each  3  . DISCONTD: glipiZIDE (GLUCOTROL) 5 MG tablet Take 5 mg by mouth 2 (two) times daily before a meal.        Allergies  Allergen Reactions  . Crestor (Rosuvastatin Calcium)     myalgias  . Morphine Hives  . Pravastatin Sodium Rash  . Simvastatin Rash      ROS:  13 systems were reviewed and are unremarkable.   Examination:  Filed Vitals:   07/04/11 0843  BP: 140/78  Pulse: 74  Weight: 140 lb (63.504 kg)     In general, well appearing women in NAD.  Cardiovascular: The patient has a  regular rate and rhythm and no carotid bruits.  Fundoscopy:  Disks are flat. Vessel caliber within normal limits.  Mental status:   MMSE 1/5 time; 5/5 place; otherwise full for 23/27.  FRS: + PM  Cranial Nerves: Pupils are equally round and reactive to light. Visual fields full to confrontation. Extraocular movements are intact without nystagmus. Facial sensation and muscles of mastication are intact. Muscles of facial expression are symmetric in activation although there is some NL flattening on the left. Hearing intact to bilateral finger rub. Tongue protrusion, uvula, palate midline.  Shoulder shrug weak on left.  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  4+ triceps, shoulder abduction on left as well.  4/5 knee extension, knee flexion on left.  Other muscles full.   Reflexes:   Biceps  Triceps Brachioradialis Knee Ankle  Right 1+  1+  1+   1+ 0  Left  2+  2+  2+   2+ 1+  Toes down  Coordination:  Left F2N dysmetria.  Sensation appears symmetric to light touch.  Gait and Station reveal left sided hemiparesis.  Negative Romberg.   Impression/Recs: 1.  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  I didn't see a b12 but didn't have a chance to check it today, so that is probably worth checking if you can.  I don't think memory testing is going to teach [place] much more here.  I also don't think we are going to get significant changes from a cholinesterase inhibitor or memantine.  I think controlling her stroke risk factors are going to be the most important part of her management.  This would include trying to get her cholesterol under control as well as her diabetes. 2.  Ischemic strokes - She is currently on aspirin and Plavix.  Unless she has a cardiac reason there is no indication for her to be on both from a cerebrovascular point of view.  However, I am not sure if  there was another reason, but from my perspective she can be switched to just Plavix.   Thank you for having [place] see Michelle Lopez in consultation.  Feel free to contact me with any questions.  Kavin Leech Jacelyn Grip, MD [facility], [place] [address], [place] [address] Phone: [phone] Fax: [phone].

## 2011-07-05 ENCOUNTER — Telehealth: Payer: Self-pay | Admitting: Neurology

## 2011-07-05 NOTE — Telephone Encounter (Signed)
Spoke with Rosaria Ferries. Aware that the application for a handicap placard has been completed and is ready for pick up. She will be in by 5:00 pm today.

## 2011-07-06 ENCOUNTER — Ambulatory Visit: Payer: Medicare Other | Admitting: Rehabilitative and Restorative Service Providers"

## 2011-07-06 ENCOUNTER — Ambulatory Visit
Admission: RE | Admit: 2011-07-06 | Discharge: 2011-07-06 | Disposition: A | Discharge status: Home / Self Care | Payer: Medicare Other | Source: Ambulatory Visit | Attending: Internal Medicine | Admitting: Internal Medicine

## 2011-07-06 DIAGNOSIS — N63 Unspecified lump in unspecified breast: Secondary | ICD-10-CM

## 2011-07-06 LAB — HM MAMMOGRAPHY

## 2011-07-10 ENCOUNTER — Encounter: Payer: Self-pay | Admitting: Gastroenterology

## 2011-07-12 ENCOUNTER — Ambulatory Visit: Payer: Medicare Other | Attending: Neurology | Admitting: Physical Therapy

## 2011-07-12 DIAGNOSIS — IMO0001 Reserved for inherently not codable concepts without codable children: Secondary | ICD-10-CM | POA: Diagnosis not present

## 2011-07-12 DIAGNOSIS — R5381 Other malaise: Secondary | ICD-10-CM | POA: Insufficient documentation

## 2011-07-12 DIAGNOSIS — M25619 Stiffness of unspecified shoulder, not elsewhere classified: Secondary | ICD-10-CM | POA: Diagnosis not present

## 2011-07-12 DIAGNOSIS — M25519 Pain in unspecified shoulder: Secondary | ICD-10-CM | POA: Diagnosis not present

## 2011-07-18 DIAGNOSIS — H04219 Epiphora due to excess lacrimation, unspecified lacrimal gland: Secondary | ICD-10-CM | POA: Diagnosis not present

## 2011-07-18 DIAGNOSIS — H5310 Unspecified subjective visual disturbances: Secondary | ICD-10-CM | POA: Diagnosis not present

## 2011-07-18 DIAGNOSIS — H01009 Unspecified blepharitis unspecified eye, unspecified eyelid: Secondary | ICD-10-CM | POA: Diagnosis not present

## 2011-07-19 ENCOUNTER — Other Ambulatory Visit: Payer: Self-pay | Admitting: Internal Medicine

## 2011-07-20 ENCOUNTER — Ambulatory Visit: Payer: Medicare Other | Admitting: Physical Therapy

## 2011-07-20 DIAGNOSIS — R5381 Other malaise: Secondary | ICD-10-CM | POA: Diagnosis not present

## 2011-07-20 DIAGNOSIS — M25619 Stiffness of unspecified shoulder, not elsewhere classified: Secondary | ICD-10-CM | POA: Diagnosis not present

## 2011-07-20 DIAGNOSIS — IMO0001 Reserved for inherently not codable concepts without codable children: Secondary | ICD-10-CM | POA: Diagnosis not present

## 2011-07-20 DIAGNOSIS — M25519 Pain in unspecified shoulder: Secondary | ICD-10-CM | POA: Diagnosis not present

## 2011-07-24 ENCOUNTER — Encounter: Payer: Medicare Other | Admitting: Physical Therapy

## 2011-07-26 ENCOUNTER — Encounter: Payer: Medicare Other | Admitting: Physical Therapy

## 2011-08-01 ENCOUNTER — Encounter: Payer: Medicare Other | Admitting: Physical Therapy

## 2011-08-01 ENCOUNTER — Ambulatory Visit: Payer: Medicare Other | Admitting: Physical Therapy

## 2011-08-01 DIAGNOSIS — M25619 Stiffness of unspecified shoulder, not elsewhere classified: Secondary | ICD-10-CM | POA: Diagnosis not present

## 2011-08-01 DIAGNOSIS — R5381 Other malaise: Secondary | ICD-10-CM | POA: Diagnosis not present

## 2011-08-01 DIAGNOSIS — IMO0001 Reserved for inherently not codable concepts without codable children: Secondary | ICD-10-CM | POA: Diagnosis not present

## 2011-08-01 DIAGNOSIS — M25519 Pain in unspecified shoulder: Secondary | ICD-10-CM | POA: Diagnosis not present

## 2011-08-03 ENCOUNTER — Encounter: Payer: Medicare Other | Admitting: Physical Therapy

## 2011-08-03 ENCOUNTER — Ambulatory Visit: Payer: Medicare Other | Admitting: Physical Therapy

## 2011-08-03 DIAGNOSIS — IMO0001 Reserved for inherently not codable concepts without codable children: Secondary | ICD-10-CM | POA: Diagnosis not present

## 2011-08-03 DIAGNOSIS — R5381 Other malaise: Secondary | ICD-10-CM | POA: Diagnosis not present

## 2011-08-03 DIAGNOSIS — M25619 Stiffness of unspecified shoulder, not elsewhere classified: Secondary | ICD-10-CM | POA: Diagnosis not present

## 2011-08-03 DIAGNOSIS — M25519 Pain in unspecified shoulder: Secondary | ICD-10-CM | POA: Diagnosis not present

## 2011-08-07 ENCOUNTER — Ambulatory Visit: Payer: Medicare Other | Attending: Internal Medicine

## 2011-08-07 DIAGNOSIS — E1139 Type 2 diabetes mellitus with other diabetic ophthalmic complication: Secondary | ICD-10-CM | POA: Diagnosis not present

## 2011-08-07 DIAGNOSIS — M25519 Pain in unspecified shoulder: Secondary | ICD-10-CM | POA: Diagnosis not present

## 2011-08-07 DIAGNOSIS — H43829 Vitreomacular adhesion, unspecified eye: Secondary | ICD-10-CM | POA: Diagnosis not present

## 2011-08-07 DIAGNOSIS — H35379 Puckering of macula, unspecified eye: Secondary | ICD-10-CM | POA: Diagnosis not present

## 2011-08-07 DIAGNOSIS — M25619 Stiffness of unspecified shoulder, not elsewhere classified: Secondary | ICD-10-CM | POA: Diagnosis not present

## 2011-08-07 DIAGNOSIS — R5381 Other malaise: Secondary | ICD-10-CM | POA: Insufficient documentation

## 2011-08-07 DIAGNOSIS — IMO0001 Reserved for inherently not codable concepts without codable children: Secondary | ICD-10-CM | POA: Insufficient documentation

## 2011-08-07 DIAGNOSIS — E11311 Type 2 diabetes mellitus with unspecified diabetic retinopathy with macular edema: Secondary | ICD-10-CM | POA: Diagnosis not present

## 2011-08-14 ENCOUNTER — Encounter: Payer: Medicare Other | Admitting: Physical Therapy

## 2011-08-15 DIAGNOSIS — E11339 Type 2 diabetes mellitus with moderate nonproliferative diabetic retinopathy without macular edema: Secondary | ICD-10-CM | POA: Diagnosis not present

## 2011-08-15 DIAGNOSIS — E11311 Type 2 diabetes mellitus with unspecified diabetic retinopathy with macular edema: Secondary | ICD-10-CM | POA: Diagnosis not present

## 2011-08-15 DIAGNOSIS — E1139 Type 2 diabetes mellitus with other diabetic ophthalmic complication: Secondary | ICD-10-CM | POA: Diagnosis not present

## 2011-08-16 ENCOUNTER — Encounter: Payer: Medicare Other | Admitting: Physical Therapy

## 2011-08-25 ENCOUNTER — Ambulatory Visit: Payer: Medicare Other | Admitting: Physical Therapy

## 2011-09-05 DIAGNOSIS — E11311 Type 2 diabetes mellitus with unspecified diabetic retinopathy with macular edema: Secondary | ICD-10-CM | POA: Diagnosis not present

## 2011-09-05 DIAGNOSIS — E1139 Type 2 diabetes mellitus with other diabetic ophthalmic complication: Secondary | ICD-10-CM | POA: Diagnosis not present

## 2011-09-05 DIAGNOSIS — E11339 Type 2 diabetes mellitus with moderate nonproliferative diabetic retinopathy without macular edema: Secondary | ICD-10-CM | POA: Diagnosis not present

## 2011-09-15 LAB — HM DIABETES EYE EXAM

## 2011-10-03 ENCOUNTER — Encounter: Payer: Self-pay | Admitting: Internal Medicine

## 2011-10-03 ENCOUNTER — Other Ambulatory Visit (INDEPENDENT_AMBULATORY_CARE_PROVIDER_SITE_OTHER): Payer: Medicare Other

## 2011-10-03 ENCOUNTER — Ambulatory Visit (INDEPENDENT_AMBULATORY_CARE_PROVIDER_SITE_OTHER): Payer: Medicare Other | Admitting: Internal Medicine

## 2011-10-03 VITALS — BP 120/72 | HR 82 | Temp 98.1°F | Resp 16 | Wt 142.0 lb

## 2011-10-03 DIAGNOSIS — H814 Vertigo of central origin: Secondary | ICD-10-CM

## 2011-10-03 DIAGNOSIS — I10 Essential (primary) hypertension: Secondary | ICD-10-CM

## 2011-10-03 DIAGNOSIS — I69391 Dysphagia following cerebral infarction: Secondary | ICD-10-CM

## 2011-10-03 DIAGNOSIS — I69991 Dysphagia following unspecified cerebrovascular disease: Secondary | ICD-10-CM

## 2011-10-03 DIAGNOSIS — E785 Hyperlipidemia, unspecified: Secondary | ICD-10-CM

## 2011-10-03 DIAGNOSIS — F329 Major depressive disorder, single episode, unspecified: Secondary | ICD-10-CM

## 2011-10-03 DIAGNOSIS — D510 Vitamin B12 deficiency anemia due to intrinsic factor deficiency: Secondary | ICD-10-CM | POA: Insufficient documentation

## 2011-10-03 DIAGNOSIS — M169 Osteoarthritis of hip, unspecified: Secondary | ICD-10-CM

## 2011-10-03 LAB — HM DIABETES FOOT EXAM: HM Diabetic Foot Exam: NORMAL

## 2011-10-03 LAB — CBC WITH DIFFERENTIAL/PLATELET
Basophils Absolute: 0 10*3/uL (ref 0.0–0.1)
Eosinophils Absolute: 0.1 10*3/uL (ref 0.0–0.7)
Lymphocytes Relative: 43.8 % (ref 12.0–46.0)
MCHC: 31.6 g/dL (ref 30.0–36.0)
Monocytes Relative: 8.6 % (ref 3.0–12.0)
Neutrophils Relative %: 45.6 % (ref 43.0–77.0)
RBC: 4.78 Mil/uL (ref 3.87–5.11)
RDW: 14.2 % (ref 11.5–14.6)

## 2011-10-03 LAB — TSH: TSH: 1.1 u[IU]/mL (ref 0.35–5.50)

## 2011-10-03 LAB — LIPID PANEL
HDL: 48.4 mg/dL (ref >39.00)
Total CHOL/HDL Ratio: 4

## 2011-10-03 LAB — IBC PANEL
Iron: 63 ug/dL (ref 42–145)
Saturation Ratios: 17.6 % — ABNORMAL LOW (ref 20.0–50.0)

## 2011-10-03 LAB — COMPREHENSIVE METABOLIC PANEL
ALT: 10 U/L (ref 0–35)
AST: 15 U/L (ref 0–37)
Albumin: 3.8 g/dL (ref 3.5–5.2)
Calcium: 9.2 mg/dL (ref 8.4–10.5)
Chloride: 102 mEq/L (ref 96–112)
Potassium: 4.3 mEq/L (ref 3.5–5.1)
Sodium: 138 mEq/L (ref 135–145)
Total Protein: 7.5 g/dL (ref 6.0–8.3)

## 2011-10-03 LAB — URINALYSIS, ROUTINE W REFLEX MICROSCOPIC
Ketones, ur: NEGATIVE
Nitrite: NEGATIVE
Specific Gravity, Urine: >=1.03 (ref 1.000–1.030)
Total Protein, Urine: NEGATIVE
pH: 5.5 (ref 5.0–8.0)

## 2011-10-03 LAB — FOLATE: Folate: 9.4 ng/mL (ref >5.9)

## 2011-10-03 LAB — HEMOGLOBIN A1C: Hgb A1c MFr Bld: 8.8 % — ABNORMAL HIGH (ref 4.6–6.5)

## 2011-10-03 NOTE — Assessment & Plan Note (Addendum)
She is doing well on livalo, for a repeat FLP today

## 2011-10-03 NOTE — Assessment & Plan Note (Signed)
I have asked her to see GI to consider endoscopy and also for speech therapy to eval and treat her swallowing

## 2011-10-03 NOTE — Assessment & Plan Note (Signed)
CBC and vitamin levels today 

## 2011-10-03 NOTE — Assessment & Plan Note (Signed)
No changes noted, she does not want a med for pain

## 2011-10-03 NOTE — Patient Instructions (Signed)

## 2011-10-03 NOTE — Assessment & Plan Note (Signed)
I will check her a1c and will monitor her renal function today

## 2011-10-03 NOTE — Assessment & Plan Note (Signed)
Her BP is well controlled, I will check her lytes and renal function 

## 2011-10-03 NOTE — Progress Notes (Signed)
Subjective:    Patient ID: Michelle Lopez, female    DOB: [birth year], 66 y.o.   MRN: [id number]  Diabetes She presents for her follow-up diabetic visit. She has type 2 diabetes mellitus. Hypoglycemia symptoms include confusion and dizziness (chronic dizziness and vertigo s/p CVA). Pertinent negatives for hypoglycemia include no headaches, nervousness/anxiousness, seizures, speech difficulty or tremors. Associated symptoms include fatigue. Pertinent negatives for diabetes include no blurred vision, no chest pain, no foot paresthesias, no foot ulcerations, no polydipsia, no polyphagia, no polyuria, no visual change, no weakness and no weight loss. There are no hypoglycemic complications. Symptoms are stable. Diabetic complications include a CVA. Current diabetic treatment includes oral agent (dual therapy). She is compliant with treatment all of the time. Her weight is stable. She is following a generally healthy diet. Meal planning includes avoidance of concentrated sweets. She has not had a previous visit with a dietician. She participates in exercise intermittently. There is no change in her home blood glucose trend. An ACE inhibitor/angiotensin II receptor blocker is being taken. She does not see a podiatrist.Eye exam is current.      Review of Systems  Constitutional: Positive for fatigue. Negative for fever, chills, weight loss, diaphoresis, activity change, appetite change and unexpected weight change.  HENT: Negative.   Eyes: Negative.  Negative for blurred vision.  Respiratory: Negative for cough, chest tightness, shortness of breath, wheezing and stridor.   Cardiovascular: Negative for chest pain, palpitations and leg swelling.  Gastrointestinal: Positive for constipation. Negative for nausea, vomiting, abdominal pain, diarrhea, blood in stool and anal bleeding.  Genitourinary: Negative.  Negative for polyuria.  Musculoskeletal: Positive for arthralgias (bilat hip pain). Negative for  myalgias, back pain, joint swelling and gait problem.  Skin: Negative.   Neurological: Positive for dizziness (chronic dizziness and vertigo s/p CVA). Negative for tremors, seizures, syncope, facial asymmetry, speech difficulty, weakness, light-headedness, numbness and headaches.  Hematological: Negative for polydipsia, polyphagia and adenopathy. Does not bruise/bleed easily.  Psychiatric/Behavioral: Positive for confusion and decreased concentration. Negative for suicidal ideas, hallucinations, behavioral problems, disturbed wake/sleep cycle, self-injury, dysphoric mood and agitation. The patient is not nervous/anxious and is not hyperactive.        Objective:   Physical Exam  Vitals reviewed. Constitutional: She is oriented to person, place, and time. She appears well-developed and well-nourished. No distress.  HENT:  Head: Normocephalic and atraumatic.  Mouth/Throat: Oropharynx is clear and moist. No oropharyngeal exudate.  Eyes: Conjunctivae are normal. Right eye exhibits no discharge. Left eye exhibits no discharge. No scleral icterus.  Neck: Normal range of motion. Neck supple. No JVD present. No tracheal deviation present. No thyromegaly present.  Cardiovascular: Normal rate, regular rhythm, normal heart sounds and intact distal pulses.  Exam reveals no gallop and no friction rub.   No murmur heard. Pulmonary/Chest: Effort normal and breath sounds normal. No stridor. No respiratory distress. She has no wheezes. She has no rales. She exhibits no tenderness.  Abdominal: Soft. Bowel sounds are normal. She exhibits no distension and no mass. There is no tenderness. There is no rebound and no guarding.  Musculoskeletal: Normal range of motion. She exhibits no edema and no tenderness.  Lymphadenopathy:    She has no cervical adenopathy.  Neurological: She is alert and oriented to person, place, and time. She has normal reflexes. She displays normal reflexes. No cranial nerve deficit. She  exhibits normal muscle tone. Coordination normal.  Skin: Skin is warm and dry. No rash noted. She is not diaphoretic. No erythema. No  pallor.  Psychiatric: She has a normal mood and affect. Her behavior is normal. Judgment and thought content normal. Her speech is delayed and tangential. Her speech is not rapid and/or pressured and not slurred. Cognition and memory are impaired. She is communicative. She exhibits abnormal recent memory and abnormal remote memory.      Lab Results  Component Value Date   WBC 4.8 04/23/2011   HGB 11.5* 04/23/2011   HCT 36.8 04/23/2011   PLT 217 04/23/2011   GLUCOSE 251* 05/30/2011   CHOL 268* 05/30/2011   TRIG 234.0* 05/30/2011   HDL 47.90 05/30/2011   LDLDIRECT 181.2 05/30/2011   LDLCALC 111* 06/24/2009   ALT 8 05/30/2011   AST 12 05/30/2011   NA 139 05/30/2011   K 4.5 05/30/2011   CL 100 05/30/2011   CREATININE 0.9 05/30/2011   BUN 19 05/30/2011   CO2 30 05/30/2011   TSH 0.595 04/20/2011   INR 1.07 04/20/2011   HGBA1C 8.2* 05/30/2011   MICROALBUR 1.95* 02/09/2009      Assessment & Plan:

## 2011-10-03 NOTE — Assessment & Plan Note (Signed)
OT/PT to eval and treat

## 2011-10-04 ENCOUNTER — Ambulatory Visit (INDEPENDENT_AMBULATORY_CARE_PROVIDER_SITE_OTHER): Payer: Medicare Other | Admitting: General Practice

## 2011-10-04 ENCOUNTER — Telehealth: Payer: Self-pay | Admitting: Internal Medicine

## 2011-10-04 ENCOUNTER — Encounter: Payer: Self-pay | Admitting: Internal Medicine

## 2011-10-04 DIAGNOSIS — Z8679 Personal history of other diseases of the circulatory system: Secondary | ICD-10-CM

## 2011-10-04 DIAGNOSIS — D510 Vitamin B12 deficiency anemia due to intrinsic factor deficiency: Secondary | ICD-10-CM

## 2011-10-04 DIAGNOSIS — E785 Hyperlipidemia, unspecified: Secondary | ICD-10-CM

## 2011-10-04 MED ORDER — PITAVASTATIN CALCIUM 2 MG PO TABS: 1.0000 | ORAL_TABLET | Freq: Every day | ORAL | Status: DC

## 2011-10-04 MED ORDER — CYANOCOBALAMIN 1000 MCG/ML IJ SOLN
1000.0000 ug | Freq: Once | INTRAMUSCULAR | Status: AC
  Administered 2011-10-04: 1000 ug via INTRAMUSCULAR

## 2011-10-04 NOTE — Telephone Encounter (Signed)
The pt is hoping to get a refill of Livalo 2mg  sent through the [facility].   Call back - [phone] Thanks !

## 2011-10-12 DIAGNOSIS — H01009 Unspecified blepharitis unspecified eye, unspecified eyelid: Secondary | ICD-10-CM | POA: Diagnosis not present

## 2011-10-12 DIAGNOSIS — H04219 Epiphora due to excess lacrimation, unspecified lacrimal gland: Secondary | ICD-10-CM | POA: Diagnosis not present

## 2011-10-16 ENCOUNTER — Other Ambulatory Visit: Payer: Self-pay | Admitting: Internal Medicine

## 2011-10-18 ENCOUNTER — Ambulatory Visit (INDEPENDENT_AMBULATORY_CARE_PROVIDER_SITE_OTHER): Payer: Medicare Other | Admitting: *Deleted

## 2011-10-18 DIAGNOSIS — D510 Vitamin B12 deficiency anemia due to intrinsic factor deficiency: Secondary | ICD-10-CM

## 2011-10-18 MED ORDER — CYANOCOBALAMIN 1000 MCG/ML IJ SOLN
1000.0000 ug | Freq: Once | INTRAMUSCULAR | Status: AC
  Administered 2011-10-18: 1000 ug via INTRAMUSCULAR

## 2011-10-25 DIAGNOSIS — H04219 Epiphora due to excess lacrimation, unspecified lacrimal gland: Secondary | ICD-10-CM | POA: Diagnosis not present

## 2011-10-25 DIAGNOSIS — H01009 Unspecified blepharitis unspecified eye, unspecified eyelid: Secondary | ICD-10-CM | POA: Diagnosis not present

## 2011-10-25 DIAGNOSIS — H04129 Dry eye syndrome of unspecified lacrimal gland: Secondary | ICD-10-CM | POA: Diagnosis not present

## 2011-10-25 DIAGNOSIS — H16229 Keratoconjunctivitis sicca, not specified as Sjogren's, unspecified eye: Secondary | ICD-10-CM | POA: Diagnosis not present

## 2011-10-25 DIAGNOSIS — H01119 Allergic dermatitis of unspecified eye, unspecified eyelid: Secondary | ICD-10-CM | POA: Diagnosis not present

## 2011-10-27 ENCOUNTER — Ambulatory Visit (INDEPENDENT_AMBULATORY_CARE_PROVIDER_SITE_OTHER): Payer: Medicare Other | Admitting: Gastroenterology

## 2011-10-27 ENCOUNTER — Encounter: Payer: Self-pay | Admitting: Gastroenterology

## 2011-10-27 VITALS — BP 124/72 | HR 68 | Ht 60.0 in | Wt 144.0 lb

## 2011-10-27 DIAGNOSIS — I69991 Dysphagia following unspecified cerebrovascular disease: Secondary | ICD-10-CM

## 2011-10-27 DIAGNOSIS — I69391 Dysphagia following cerebral infarction: Secondary | ICD-10-CM

## 2011-10-27 NOTE — Patient Instructions (Addendum)
Tanya with Speech Pathology at [facility] will be calling you to set up appointments. Please call her at [phone] if you do not hear from her by next Wednesday.  CC: Scarlette Calico, M. D.

## 2011-10-27 NOTE — Progress Notes (Signed)
History of Present Illness:  This is a 66 year old [place] American female referred for evaluation of dysphagia. This patient has had previous CVAs with resultant neurogenic dysphagia previously evaluated by speech path oligemia with most recent exam in February of 2011. Patient continues with transfer dysphagia for solids and liquids in her retropharyngeal area. She denies any history of acid reflux, chest pain, or dysphagia for solid foods in her substernal area. Her daughter is with her today, the patient denies other GI issues with regular bowel movements and no melena or hematochezia. Because of her strokes and CVAs, she is on aspirin and Plavix, also Genevia for her diabetes. She does have a previous history of cocaine use. Despite all these complaints her daughter relates her appetite is good her weight has been fairly stable.  I have reviewed this patient's present history, medical and surgical past history, allergies and medications.          Physical Exam: Elderly appearing patient in no acute distress. Blood pressure 120/72, pulse 68, weight 144 pounds with BMI of 28.13. I cannot see a abdomen is on examination oropharynx area. General well developed well nourished patient in no acute distress, appearing their stated age Eyes PERRLA, no icterus, fundoscopic exam per opthamologist Skin no lesions noted Neck supple, no adenopathy, no thyroid enlargement, no tenderness Chest clear to percussion and auscultation Heart no significant murmurs, gallops or rubs noted Abdomen no hepatosplenomegaly masses or tenderness, BS normal. . Psychological mental status normal and normal affect.  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 I do not think this patient needs endoscopic exam at this time. I have referred her to speech pathology for repeat evaluation and treatment. She is to continue her other medications and followup with primary care as scheduled. She does have rather  profound B12 deficiency being treated by [employer]. Labs otherwise review shows normal CBC and liver profile. She also is not a good candidate for colonoscopy, and I would not recommend this exam at this time it her current medical condition.No diagnosis found.   Please send a copy this note to Dr. Scarlette Calico and primary care

## 2011-11-02 DIAGNOSIS — H409 Unspecified glaucoma: Secondary | ICD-10-CM | POA: Diagnosis not present

## 2011-11-02 DIAGNOSIS — H35129 Retinopathy of prematurity, stage 1, unspecified eye: Secondary | ICD-10-CM | POA: Diagnosis not present

## 2011-11-13 ENCOUNTER — Ambulatory Visit: Payer: Medicare Other | Attending: Internal Medicine | Admitting: Speech Pathology

## 2011-11-13 DIAGNOSIS — R1312 Dysphagia, oropharyngeal phase: Secondary | ICD-10-CM | POA: Diagnosis not present

## 2011-11-13 DIAGNOSIS — IMO0001 Reserved for inherently not codable concepts without codable children: Secondary | ICD-10-CM | POA: Insufficient documentation

## 2011-11-16 DIAGNOSIS — H2510 Age-related nuclear cataract, unspecified eye: Secondary | ICD-10-CM | POA: Diagnosis not present

## 2011-11-16 DIAGNOSIS — H409 Unspecified glaucoma: Secondary | ICD-10-CM | POA: Diagnosis not present

## 2011-11-16 DIAGNOSIS — H402290 Chronic angle-closure glaucoma, unspecified eye, stage unspecified: Secondary | ICD-10-CM | POA: Diagnosis not present

## 2011-11-16 DIAGNOSIS — H35379 Puckering of macula, unspecified eye: Secondary | ICD-10-CM | POA: Diagnosis not present

## 2011-11-16 DIAGNOSIS — H538 Other visual disturbances: Secondary | ICD-10-CM | POA: Diagnosis not present

## 2011-11-17 ENCOUNTER — Ambulatory Visit (HOSPITAL_COMMUNITY)
Admission: RE | Admit: 2011-11-17 | Discharge: 2011-11-17 | Disposition: A | Discharge status: Home / Self Care | Payer: Medicare Other | Source: Ambulatory Visit | Attending: Internal Medicine | Admitting: Internal Medicine

## 2011-11-17 DIAGNOSIS — I69991 Dysphagia following unspecified cerebrovascular disease: Secondary | ICD-10-CM | POA: Insufficient documentation

## 2011-11-17 DIAGNOSIS — R1310 Dysphagia, unspecified: Secondary | ICD-10-CM | POA: Diagnosis not present

## 2011-11-17 NOTE — Procedures (Signed)
Objective Swallowing Evaluation: Modified Barium Swallowing Study  Patient Details  Name: Michelle Lopez MRN: [id number] Date of Birth: Nov 09, 1945  Today's Date: 11/17/2011 Time: [id number] SLP Time Calculation (min): 20 min  Past Medical History:  Past Medical History  Diagnosis Date  . Hyperlipidemia   . Diabetes mellitus     type 2, uncontrolled  . Essential hypertension, benign   . Cocaine abuse, unspecified   . Diarrhea   . Cerebrovascular disease, unspecified    Past Surgical History:  Past Surgical History  Procedure Date  . Total hip arthroplasty 1970    Dr. Rodell Perna   HPI:  This is a 66 year old African American female referred recently to her PCP for evaluation of dysphagia. 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  She has undergone multiple MBS studies in the past, the most recent as an OP 10/01/09.  This study found silent aspiration of thin liquids, prevented with a chin-down posture.  Patient continues with transfer dysphagia for solids and liquids in her retropharyngeal area. She denies any history of acid reflux, chest pain, or dysphagia for solid foods in her substernal area.  She does have a previous history of cocaine use. Despite all these complaints her daughter relates her appetite is good her weight has been fairly stable.  Pt was referred for an OPSLP given persistence of dysphagia and the constant cough that she describes is associated with PO intake.     Assessment / Plan / Recommendation Clinical Impression  Dysphagia Diagnosis: Mild pharyngeal phase dysphagia Clinical impression: Pt presents with a mild pharyngeal swallow marked by delayed trigger with resultant trace aspiration of thin liquids: this occurred on one occasion, when pt was asked to consume multiple, large, consecutive boluses.  When pt controlled volume and rate of intake, there was no penetration nor aspiration.  Solid consistencies were  swallowed without incident.  Recommend pt continue with current PO consistencies, but that she avoid successive swalllows of liquids and monitor bolus size/rate of intake.  These simple precautions should reduce aspiration and subsequent coughing events.  Daughter and pt agree with results and recs.    Treatment Recommendation    none   Diet Recommendation Regular;Thin liquid   Liquid Administration via: Cup;Straw Medication Administration: Whole meds with liquid Supervision: Patient able to self feed Compensations: Slow rate;Small sips/bites Postural Changes and/or Swallow Maneuvers: Seated upright 90 degrees    Other  Recommendations Oral Care Recommendations: Oral care BID   Follow Up Recommendations  None    Frequency and Duration   n/a     Pertinent Vitals/Pain No pain     General HPI: This is a 66 year old [place] American female referred recently to her PCP for evaluation of dysphagia. 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  She has undergone multiple MBS studies in the past, the most recent as an OP 10/01/09.  This study found silent aspiration of thin liquids, prevented with a chin-down posture.  Patient continues with transfer dysphagia for solids and liquids in her retropharyngeal area. She denies any history of acid reflux, chest pain, or dysphagia for solid foods in her substernal area.  She does have a previous history of cocaine use. Despite all these complaints her daughter relates her appetite is good her weight has been fairly stable.  Pt was referred for an OPSLP given persistence of dysphagia and the constant cough that she describes is associated with PO intake.  Type of Study: Modified Barium Swallowing Study Reason for Referral: Objectively evaluate swallowing function Previous Swallow Assessment: yes Diet Prior to this Study: Regular;Thin liquids Temperature Spikes Noted: No Respiratory Status: Room  air History of Recent Intubation: No Behavior/Cognition: Alert;Cooperative;Pleasant mood Oral Motor / Sensory Function: Within functional limits Self-Feeding Abilities: Able to feed self Patient Positioning: Upright in chair Baseline Vocal Quality: Clear Volitional Cough: Strong Volitional Swallow: Able to elicit Anatomy: Within functional limits Pharyngeal Secretions: Not observed secondary MBS    Reason for Referral Objectively evaluate swallowing function   Oral Phase Oral Preparation/Oral Phase Oral Phase: WFL   Pharyngeal Phase Pharyngeal Phase Pharyngeal Phase: Impaired Pharyngeal - Nectar Pharyngeal - Nectar Cup: Delayed swallow initiation Pharyngeal - Thin Pharyngeal - Thin Cup: Delayed swallow initiation;Penetration/Aspiration during swallow Penetration/Aspiration details (thin cup): Material enters airway, passes BELOW cords and not ejected out despite cough attempt by patient Pharyngeal Phase - Comment Pharyngeal Comment: one incident of trace aspiration noted  Cervical Esophageal Phase        Cervical Esophageal Phase Cervical Esophageal Phase: [facility]    Functional Assessment Tool Used: clinical judgement Functional Limitations: Swallowing Swallow Current Status [id number]): At least 1 percent but less than 20 percent impaired, limited or restricted Swallow Goal Status [phone]): At least 1 percent but less than 20 percent impaired, limited or restricted Swallow Discharge Status [phone]): At least 1 percent but less than 20 percent impaired, limited or restricted   Michelle Lopez L. Tivis Ringer, [place] CCC/SLP Pager [phone]  Michelle Lopez 11/17/2011, 2:32 PM

## 2011-11-21 DIAGNOSIS — H402290 Chronic angle-closure glaucoma, unspecified eye, stage unspecified: Secondary | ICD-10-CM | POA: Diagnosis not present

## 2011-12-11 ENCOUNTER — Other Ambulatory Visit: Payer: Self-pay | Admitting: Internal Medicine

## 2011-12-12 DIAGNOSIS — H2510 Age-related nuclear cataract, unspecified eye: Secondary | ICD-10-CM | POA: Diagnosis not present

## 2011-12-12 DIAGNOSIS — H269 Unspecified cataract: Secondary | ICD-10-CM | POA: Diagnosis not present

## 2011-12-13 ENCOUNTER — Telehealth: Payer: Self-pay

## 2011-12-13 NOTE — Telephone Encounter (Signed)
Pt advised of same.

## 2011-12-13 NOTE — Telephone Encounter (Signed)
I don't think I am the doctor that prescribed that, women take it for kidney stones/renal pain

## 2011-12-13 NOTE — Telephone Encounter (Signed)
Pt called to requesting advisement on why she is taking Flomax. She was told that it is to treat sxs of enlarged prostate in men, please advise.

## 2011-12-28 IMAGING — CT CT ORBITS W/O CM
3 of 5 series · 16 of 47 positions shown, 19 images · non-contrast
Comparison: MRI 09/23/2008, CT 09/23/2008

CLINICAL DATA: Bilateral orbital swelling.  Discharge.

CT ORBITS WITHOUT CONTRAST
TECHNIQUE: Multidetector CT imaging of the orbits was performed
following the standard protocol without intravenous contrast.

[Series 3: orbit/facial 2.0 h30s · axial · 0.34mm/px · z∈[-138,-64]mm · 12 of 43 slices shown, 15 images]
[im 3/43  brain]
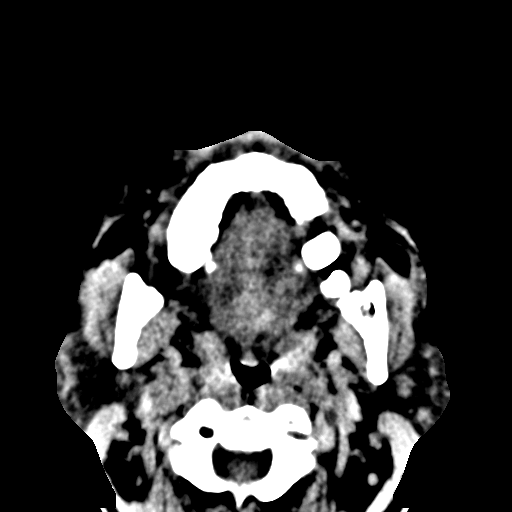
[im 3/43  bone]
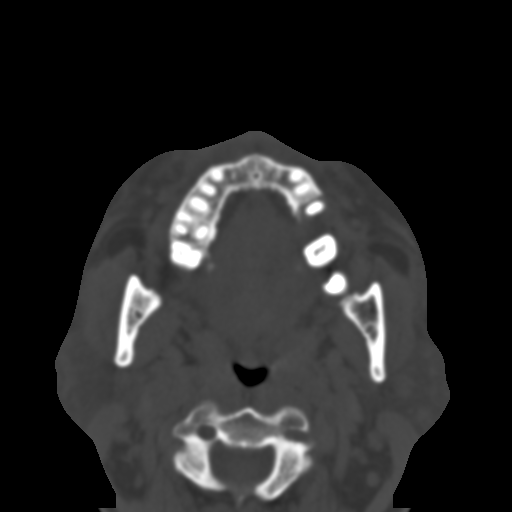
[im 6/43  bone]
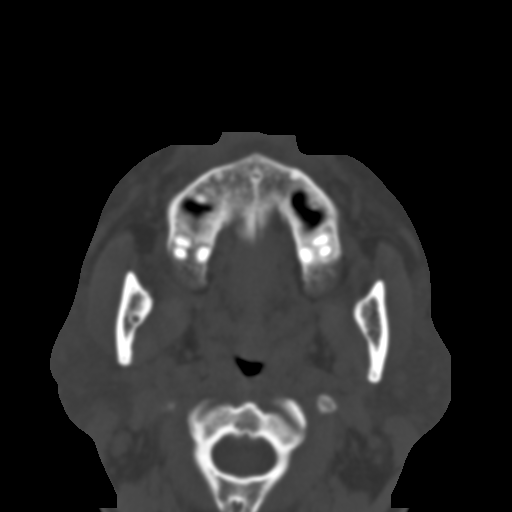
[im 9/43  bone]
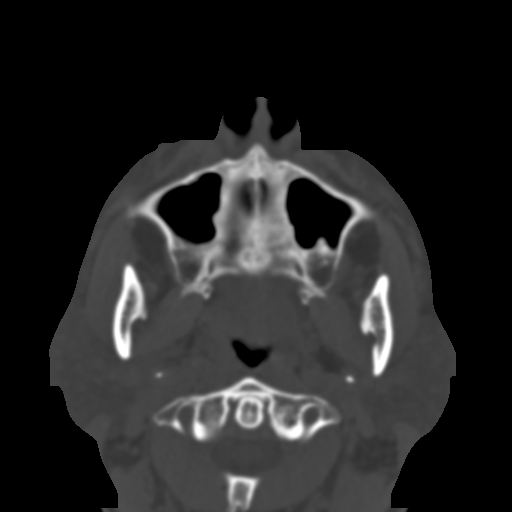
[im 14/43  bone]
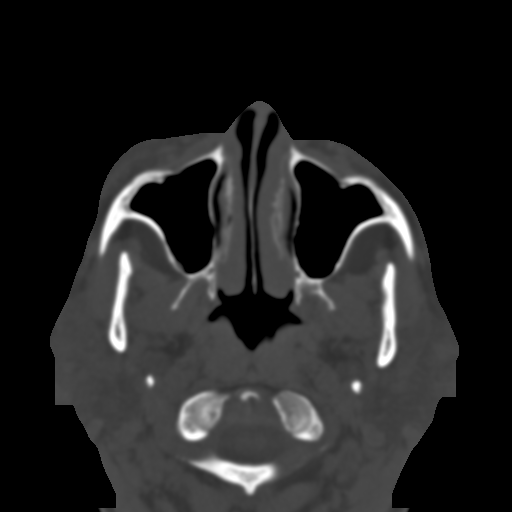
[im 16/43  brain]
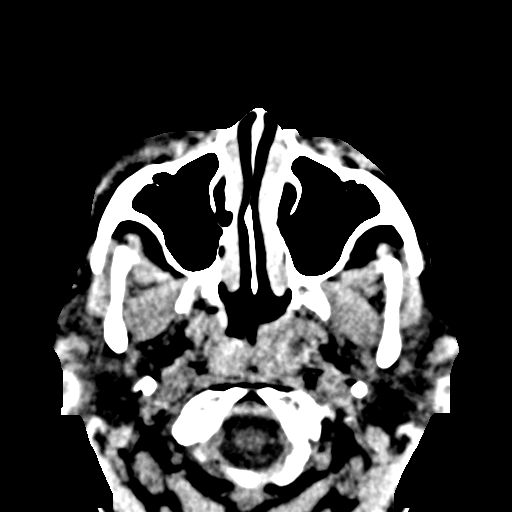
[im 16/43  bone]
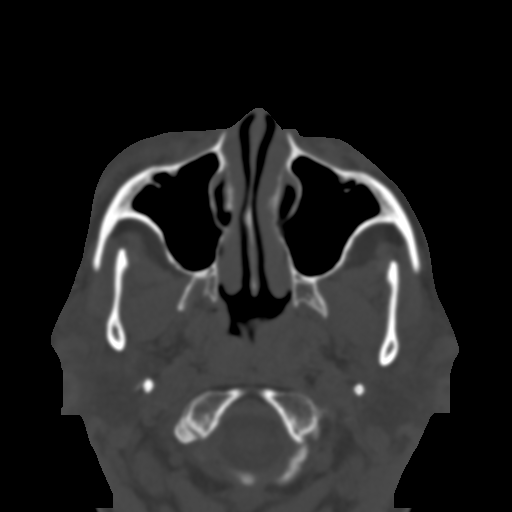
[im 19/43  bone]
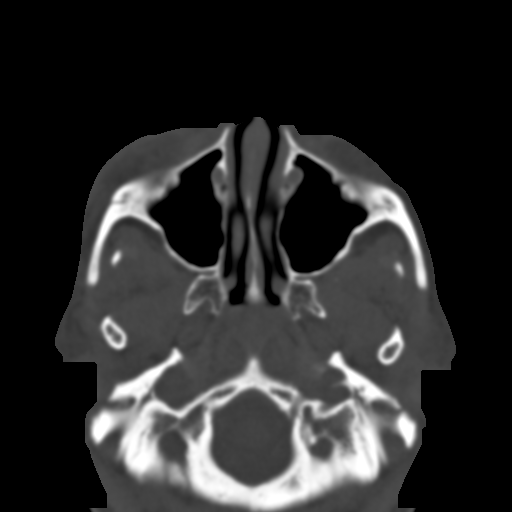
[im 24/43  bone]
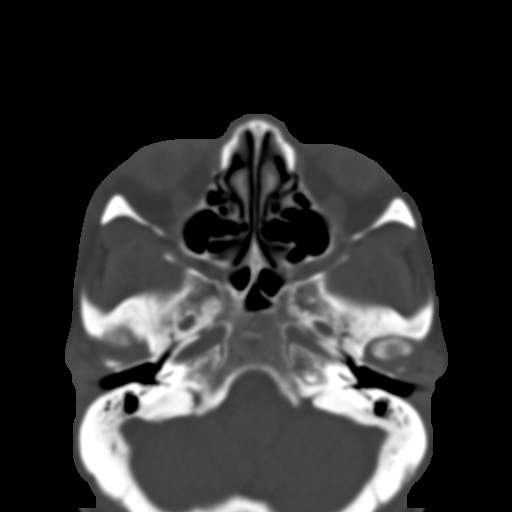
[im 27/43  bone]
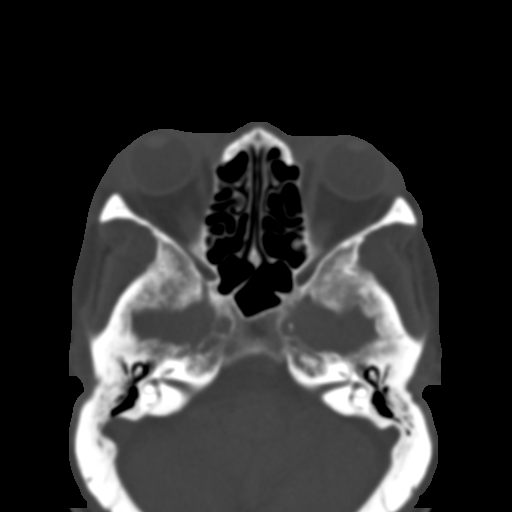
[im 29/43  brain]
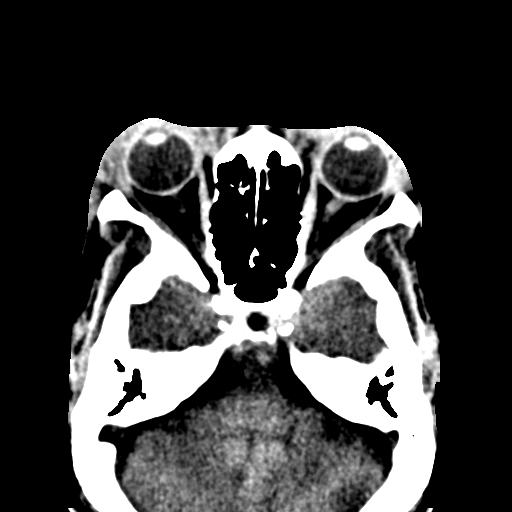
[im 29/43  bone]
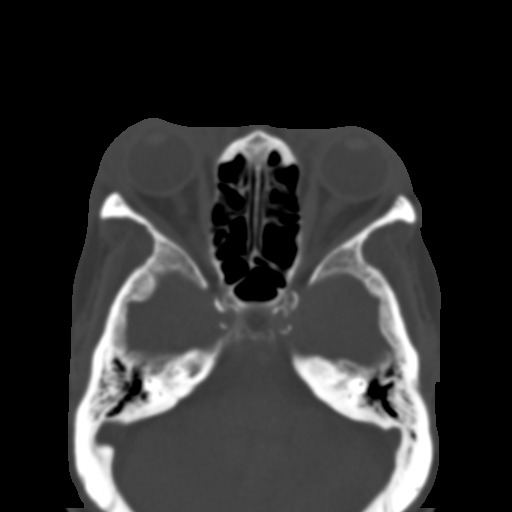
[im 34/43  bone]
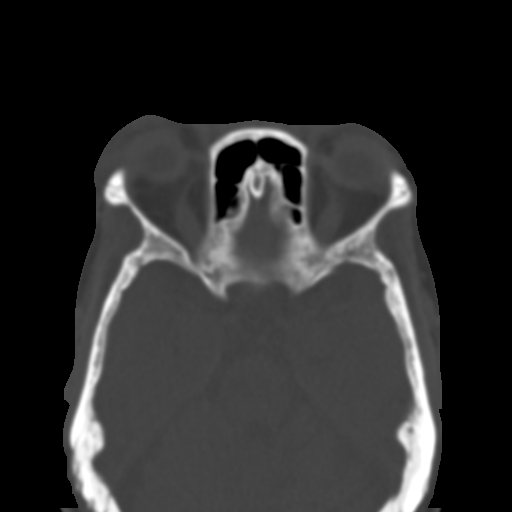
[im 37/43  bone]
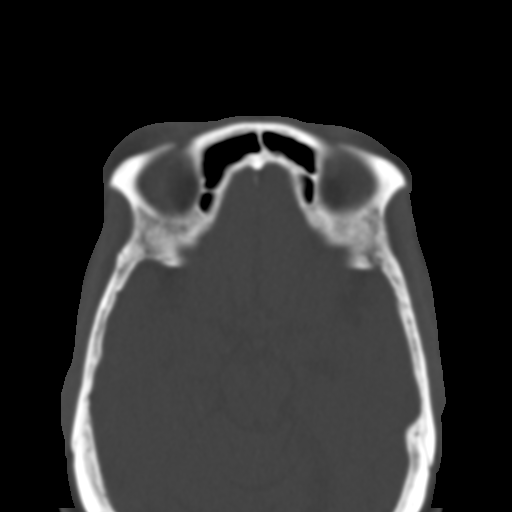
[im 40/43  bone]
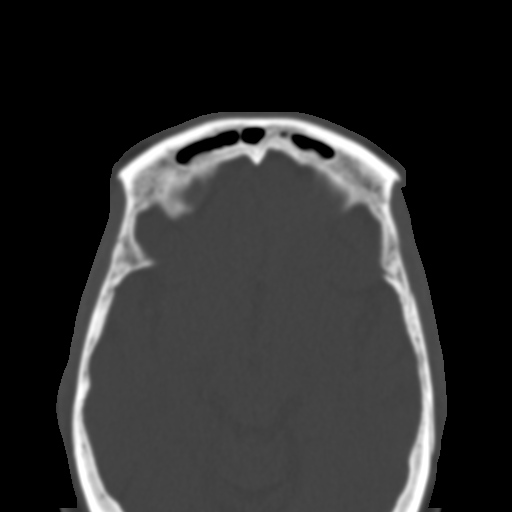

[Series 7: orbit/facial 2.0 bone cor · coronal · 0.39mm/px · 3 of 56 slices shown]
[im 14/56  bone]
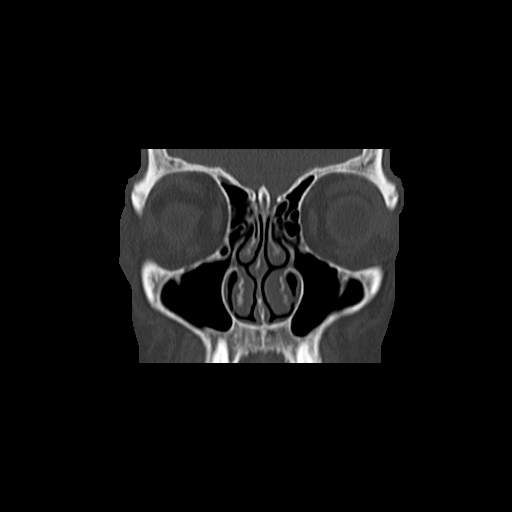
[im 28/56  bone]
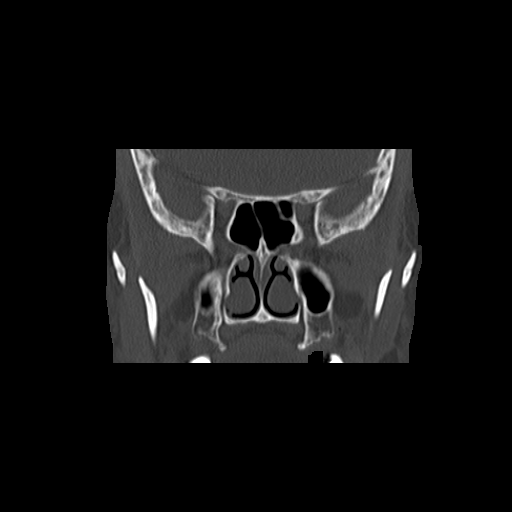
[im 42/56  bone]
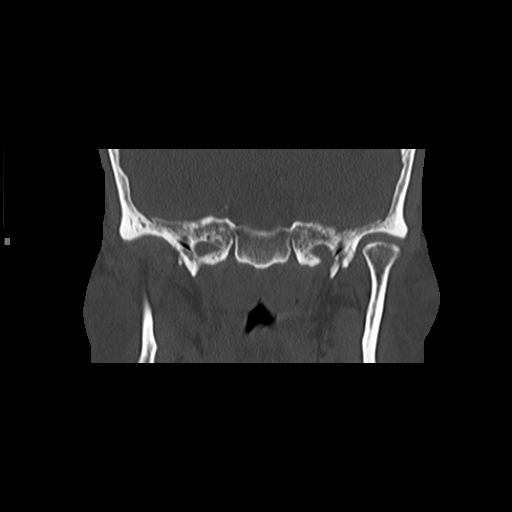

[Series 8: orbit/facial 2.0 bone sag · sagittal · 0.39mm/px · 1 of 68 slices shown]
[im 34/68  bone]
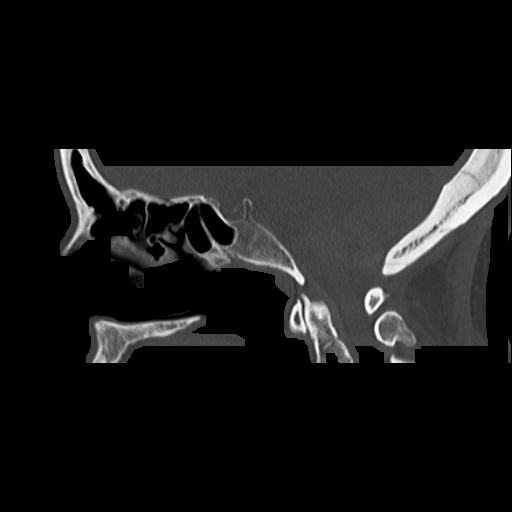

[16 of 47 positions shown; findings below may reference images not displayed]

FINDINGS: There is significant thickening of the eyelid
bilaterally, right greater than left.  This does not extend into
the orbit.  The globe is normal.  Orbital fat is normal.
Extraocular muscles and optic nerve are normal.  Intravenous
contrast was not administered.

No acute bony abnormality.  No significant sinusitis.  The sinuses
are clear.
IMPRESSION: Significant soft tissue swelling of the eye lid bilaterally.  This
may be due to infection or allergic reaction.  No evidence of
orbital cellulitis or abscess.

## 2012-01-02 DIAGNOSIS — H2510 Age-related nuclear cataract, unspecified eye: Secondary | ICD-10-CM | POA: Diagnosis not present

## 2012-01-02 DIAGNOSIS — E11311 Type 2 diabetes mellitus with unspecified diabetic retinopathy with macular edema: Secondary | ICD-10-CM | POA: Diagnosis not present

## 2012-01-02 DIAGNOSIS — E11339 Type 2 diabetes mellitus with moderate nonproliferative diabetic retinopathy without macular edema: Secondary | ICD-10-CM | POA: Diagnosis not present

## 2012-01-02 DIAGNOSIS — E1139 Type 2 diabetes mellitus with other diabetic ophthalmic complication: Secondary | ICD-10-CM | POA: Diagnosis not present

## 2012-01-18 DIAGNOSIS — H2510 Age-related nuclear cataract, unspecified eye: Secondary | ICD-10-CM | POA: Diagnosis not present

## 2012-02-06 ENCOUNTER — Encounter: Payer: Self-pay | Admitting: Internal Medicine

## 2012-02-06 ENCOUNTER — Other Ambulatory Visit (INDEPENDENT_AMBULATORY_CARE_PROVIDER_SITE_OTHER): Payer: Medicare Other

## 2012-02-06 ENCOUNTER — Ambulatory Visit (INDEPENDENT_AMBULATORY_CARE_PROVIDER_SITE_OTHER): Payer: Medicare Other | Admitting: Internal Medicine

## 2012-02-06 VITALS — BP 118/72 | HR 84 | Temp 98.0°F | Resp 16 | Wt 146.0 lb

## 2012-02-06 DIAGNOSIS — IMO0001 Reserved for inherently not codable concepts without codable children: Secondary | ICD-10-CM

## 2012-02-06 DIAGNOSIS — I69391 Dysphagia following cerebral infarction: Secondary | ICD-10-CM

## 2012-02-06 DIAGNOSIS — I69991 Dysphagia following unspecified cerebrovascular disease: Secondary | ICD-10-CM

## 2012-02-06 DIAGNOSIS — I10 Essential (primary) hypertension: Secondary | ICD-10-CM | POA: Diagnosis not present

## 2012-02-06 DIAGNOSIS — Z8679 Personal history of other diseases of the circulatory system: Secondary | ICD-10-CM | POA: Diagnosis not present

## 2012-02-06 DIAGNOSIS — Z0000 Encounter for general adult medical examination without abnormal findings: Secondary | ICD-10-CM

## 2012-02-06 LAB — BASIC METABOLIC PANEL
BUN: 29 mg/dL — ABNORMAL HIGH (ref 6–23)
CO2: 29 mEq/L (ref 19–32)
Chloride: 101 mEq/L (ref 96–112)
Creatinine, Ser: 1.2 mg/dL (ref 0.4–1.2)
Glucose, Bld: 340 mg/dL — ABNORMAL HIGH (ref 70–99)

## 2012-02-06 NOTE — Progress Notes (Signed)
Subjective:    Patient ID: Michelle Lopez, female    DOB: 10-03-1945, 66 y.o.   MRN: [id number]  Diabetes She presents for her follow-up diabetic visit. She has type 2 diabetes mellitus. Her disease course has been stable. Hypoglycemia symptoms include dizziness (chronic,recurrent). Pertinent negatives for hypoglycemia include no headaches, pallor, seizures, speech difficulty or tremors. Pertinent negatives for diabetes include no blurred vision, no chest pain, no fatigue, no foot paresthesias, no foot ulcerations, no polydipsia, no polyphagia, no polyuria, no visual change, no weakness and no weight loss. There are no hypoglycemic complications. Symptoms are stable. Diabetic complications include a CVA. Current diabetic treatment includes oral agent (dual therapy). She is compliant with treatment all of the time. Her weight is stable. She is following a generally healthy diet. Meal planning includes avoidance of concentrated sweets. She has not had a previous visit with a dietician. She participates in exercise intermittently. There is no change in her home blood glucose trend. An ACE inhibitor/angiotensin II receptor blocker is being taken. She does not see a podiatrist.Eye exam is current.      Review of Systems  Constitutional: Negative for fever, chills, weight loss, diaphoresis, activity change, appetite change, fatigue and unexpected weight change.  HENT: Positive for trouble swallowing (chronic since CVA). Negative for sore throat, facial swelling, neck stiffness and voice change.   Eyes: Negative.  Negative for blurred vision.  Cardiovascular: Negative for chest pain and leg swelling.  Gastrointestinal: Negative for nausea, vomiting, abdominal pain, diarrhea and constipation.  Genitourinary: Negative.  Negative for polyuria.  Musculoskeletal: Negative.   Skin: Negative for color change, pallor, rash and wound.  Neurological: Positive for dizziness (chronic,recurrent). Negative for tremors,  seizures, syncope, facial asymmetry, speech difficulty, weakness, light-headedness, numbness and headaches.  Hematological: Negative for polydipsia, polyphagia and adenopathy. Does not bruise/bleed easily.  Psychiatric/Behavioral: Negative.        Objective:   Physical Exam  Vitals reviewed. Constitutional: She is oriented to person, place, and time. She appears well-developed and well-nourished. No distress.  HENT:  Head: Normocephalic and atraumatic.  Mouth/Throat: Oropharynx is clear and moist. No oropharyngeal exudate.  Eyes: Conjunctivae normal are normal. Right eye exhibits no discharge. Left eye exhibits no discharge. No scleral icterus.  Neck: Normal range of motion. Neck supple. No JVD present. No tracheal deviation present. No thyromegaly present.  Cardiovascular: Normal rate, regular rhythm, normal heart sounds and intact distal pulses.  Exam reveals no gallop and no friction rub.   No murmur heard. Pulmonary/Chest: Effort normal and breath sounds normal. No stridor. No respiratory distress. She has no wheezes. She has no rales. She exhibits no tenderness.  Abdominal: Soft. Bowel sounds are normal. She exhibits no distension and no mass. There is no tenderness. There is no rebound and no guarding.  Musculoskeletal: Normal range of motion. She exhibits no edema and no tenderness.  Lymphadenopathy:    She has no cervical adenopathy.  Neurological: She is oriented to person, place, and time.  Skin: Skin is warm and dry. No rash noted. She is not diaphoretic. No erythema. No pallor.  Psychiatric: She has a normal mood and affect. Her behavior is normal. Judgment and thought content normal.      Lab Results  Component Value Date   WBC 6.2 10/03/2011   HGB 12.4 10/03/2011   HCT 39.1 10/03/2011   PLT 216.0 10/03/2011   GLUCOSE 212* 10/03/2011   CHOL 183 10/03/2011   TRIG 156.0* 10/03/2011   HDL 48.40 10/03/2011   LDLDIRECT  181.2 05/30/2011   LDLCALC 103* 10/03/2011   ALT 10  10/03/2011   AST 15 10/03/2011   NA 138 10/03/2011   K 4.3 10/03/2011   CL 102 10/03/2011   CREATININE 1.1 10/03/2011   BUN 24* 10/03/2011   CO2 28 10/03/2011   TSH 1.10 10/03/2011   INR 1.07 04/20/2011   HGBA1C 8.8* 10/03/2011   MICROALBUR 1.95* 02/09/2009      Assessment & Plan:

## 2012-02-06 NOTE — Assessment & Plan Note (Signed)
No changes noted She is managing to eat pretty well with no choking spells

## 2012-02-06 NOTE — Assessment & Plan Note (Signed)
Her BP is well controlled 

## 2012-02-06 NOTE — Assessment & Plan Note (Signed)
She has chronic dizziness and dysphagia as a result of this No changes noted today

## 2012-02-06 NOTE — Patient Instructions (Signed)

## 2012-02-06 NOTE — Assessment & Plan Note (Addendum)
I will check her A1C and will address if needed Will also monitor her renal function  Late note - her A1C is high so I have asked her to start invokana

## 2012-02-08 MED ORDER — CANAGLIFLOZIN 300 MG PO TABS: 1.0000 | ORAL_TABLET | Freq: Every day | ORAL | Status: DC

## 2012-02-08 NOTE — Addendum Note (Signed)
Addended by: Janith Lima on: 02/08/2012 07:56 AM   Modules accepted: Orders

## 2012-02-20 DIAGNOSIS — H2510 Age-related nuclear cataract, unspecified eye: Secondary | ICD-10-CM | POA: Diagnosis not present

## 2012-02-20 DIAGNOSIS — H269 Unspecified cataract: Secondary | ICD-10-CM | POA: Diagnosis not present

## 2012-03-07 ENCOUNTER — Telehealth: Payer: Self-pay

## 2012-03-07 NOTE — Telephone Encounter (Signed)
Per [employer] PA for [employer] approved to 01/26/13, pharmacy notified via fax

## 2012-04-08 ENCOUNTER — Other Ambulatory Visit: Payer: Self-pay | Admitting: Internal Medicine

## 2012-04-29 DIAGNOSIS — H16149 Punctate keratitis, unspecified eye: Secondary | ICD-10-CM | POA: Diagnosis not present

## 2012-04-29 DIAGNOSIS — H409 Unspecified glaucoma: Secondary | ICD-10-CM | POA: Diagnosis not present

## 2012-04-29 DIAGNOSIS — H02839 Dermatochalasis of unspecified eye, unspecified eyelid: Secondary | ICD-10-CM | POA: Diagnosis not present

## 2012-04-29 DIAGNOSIS — H04129 Dry eye syndrome of unspecified lacrimal gland: Secondary | ICD-10-CM | POA: Diagnosis not present

## 2012-04-29 DIAGNOSIS — H402290 Chronic angle-closure glaucoma, unspecified eye, stage unspecified: Secondary | ICD-10-CM | POA: Diagnosis not present

## 2012-04-30 ENCOUNTER — Other Ambulatory Visit: Payer: Self-pay | Admitting: Internal Medicine

## 2012-06-03 ENCOUNTER — Encounter: Payer: Self-pay | Admitting: Internal Medicine

## 2012-06-03 ENCOUNTER — Ambulatory Visit (INDEPENDENT_AMBULATORY_CARE_PROVIDER_SITE_OTHER): Payer: Medicare Other | Admitting: Internal Medicine

## 2012-06-03 ENCOUNTER — Other Ambulatory Visit (INDEPENDENT_AMBULATORY_CARE_PROVIDER_SITE_OTHER): Payer: Medicare Other

## 2012-06-03 VITALS — BP 134/72 | HR 80 | Temp 98.0°F | Resp 16 | Wt 138.5 lb

## 2012-06-03 DIAGNOSIS — I10 Essential (primary) hypertension: Secondary | ICD-10-CM

## 2012-06-03 DIAGNOSIS — E785 Hyperlipidemia, unspecified: Secondary | ICD-10-CM

## 2012-06-03 DIAGNOSIS — IMO0001 Reserved for inherently not codable concepts without codable children: Secondary | ICD-10-CM

## 2012-06-03 DIAGNOSIS — Z8679 Personal history of other diseases of the circulatory system: Secondary | ICD-10-CM

## 2012-06-03 DIAGNOSIS — H814 Vertigo of central origin: Secondary | ICD-10-CM

## 2012-06-03 LAB — LIPID PANEL
Cholesterol: 177 mg/dL (ref 0–200)
LDL Cholesterol: 100 mg/dL — ABNORMAL HIGH (ref 0–99)

## 2012-06-03 LAB — HEMOGLOBIN A1C: Hgb A1c MFr Bld: 8.2 % — ABNORMAL HIGH (ref 4.6–6.5)

## 2012-06-03 LAB — COMPREHENSIVE METABOLIC PANEL
CO2: 26 mEq/L (ref 19–32)
Creatinine, Ser: 1.2 mg/dL (ref 0.4–1.2)
GFR: 56.69 mL/min — ABNORMAL LOW (ref >60.00)
Glucose, Bld: 166 mg/dL — ABNORMAL HIGH (ref 70–99)
Total Bilirubin: 0.4 mg/dL (ref 0.3–1.2)

## 2012-06-03 MED ORDER — CANAGLIFLOZIN 300 MG PO TABS: 1.0000 | ORAL_TABLET | Freq: Every day | ORAL | Status: DC

## 2012-06-03 MED ORDER — METFORMIN HCL 1000 MG PO TABS: 1000.0000 mg | ORAL_TABLET | Freq: Two times a day (BID) | ORAL | Status: DC

## 2012-06-03 MED ORDER — PITAVASTATIN CALCIUM 2 MG PO TABS: 1.0000 | ORAL_TABLET | Freq: Every day | ORAL | Status: DC

## 2012-06-03 NOTE — Assessment & Plan Note (Addendum)
She has not been taking livalo (she tells me that her pharmacy never delivered it) Will add welchol as well

## 2012-06-03 NOTE — Progress Notes (Signed)
Subjective:    Patient ID: Michelle Lopez, female    DOB: 1945-08-23, 67 y.o.   MRN: [id number]  Diabetes She presents for her follow-up diabetic visit. She has type 2 diabetes mellitus. Her disease course has been improving. Hypoglycemia symptoms include dizziness (recurrent episodes of dizziness and vertigo). Pertinent negatives for hypoglycemia include no headaches, pallor, seizures, speech difficulty or tremors. Associated symptoms include weakness. Pertinent negatives for diabetes include no blurred vision, no chest pain, no fatigue, no foot paresthesias, no foot ulcerations, no polydipsia, no polyphagia, no polyuria, no visual change and no weight loss. There are no hypoglycemic complications. Symptoms are stable. Diabetic complications include a CVA. Current diabetic treatment includes oral agent (triple therapy). She is compliant with treatment some of the time. Her weight is stable. She participates in exercise intermittently. There is no change in her home blood glucose trend. An ACE inhibitor/angiotensin II receptor blocker is being taken. She does not see a podiatrist.Eye exam is current.      Review of Systems  Constitutional: Negative for fever, chills, weight loss, diaphoresis, activity change, appetite change, fatigue and unexpected weight change.  HENT: Negative.   Eyes: Negative.  Negative for blurred vision.  Respiratory: Negative.  Negative for apnea, cough, choking, chest tightness, shortness of breath, wheezing and stridor.   Cardiovascular: Negative.  Negative for chest pain, palpitations and leg swelling.  Gastrointestinal: Negative.  Negative for nausea, vomiting, abdominal pain, diarrhea, constipation and blood in stool.  Endocrine: Negative.  Negative for polydipsia, polyphagia and polyuria.  Genitourinary: Negative.  Negative for dysuria, frequency, flank pain, enuresis and difficulty urinating.  Musculoskeletal: Negative.  Negative for myalgias, back pain, joint  swelling, arthralgias and gait problem.  Skin: Negative.  Negative for color change, pallor, rash and wound.  Allergic/Immunologic: Negative.   Neurological: Positive for dizziness (recurrent episodes of dizziness and vertigo) and weakness. Negative for tremors, seizures, syncope, facial asymmetry, speech difficulty, light-headedness, numbness and headaches.  Hematological: Negative.  Negative for adenopathy. Does not bruise/bleed easily.  Psychiatric/Behavioral: Negative.        Objective:   Physical Exam  Vitals reviewed. Constitutional: She appears well-developed and well-nourished. No distress.  HENT:  Head: Normocephalic and atraumatic.  Mouth/Throat: Oropharynx is clear and moist. No oropharyngeal exudate.  Eyes: Conjunctivae are normal. Right eye exhibits no discharge. Left eye exhibits no discharge. No scleral icterus.  Neck: Normal range of motion. Neck supple. No JVD present. No tracheal deviation present. No thyromegaly present.  Cardiovascular: Normal rate, regular rhythm, normal heart sounds and intact distal pulses.  Exam reveals no gallop and no friction rub.   No murmur heard. Pulmonary/Chest: Effort normal and breath sounds normal. No stridor. No respiratory distress. She has no wheezes. She has no rales. She exhibits no tenderness.  Abdominal: Soft. Bowel sounds are normal. She exhibits no distension and no mass. There is no tenderness. There is no rebound and no guarding.  Lymphadenopathy:    She has no cervical adenopathy.  Neurological: She is alert. She has normal strength. She displays atrophy and abnormal reflex. No cranial nerve deficit or sensory deficit. She exhibits abnormal muscle tone. She displays a negative Romberg sign. Coordination and gait abnormal. She displays no Babinski's sign on the right side. She displays Babinski's sign on the left side.  Reflex Scores:      Tricep reflexes are 1+ on the right side and 1+ on the left side.      Bicep reflexes are  1+ on the right side and  1+ on the left side.      Brachioradialis reflexes are 1+ on the right side and 1+ on the left side.      Patellar reflexes are 1+ on the right side and 2+ on the left side.      Achilles reflexes are 1+ on the right side and 2+ on the left side. Skin: Skin is warm and dry. No rash noted. She is not diaphoretic. No erythema. No pallor.  Psychiatric: She has a normal mood and affect. Her behavior is normal. Judgment and thought content normal.     Lab Results  Component Value Date   WBC 6.2 10/03/2011   HGB 12.4 10/03/2011   HCT 39.1 10/03/2011   PLT 216.0 10/03/2011   GLUCOSE 340* 02/06/2012   CHOL 183 10/03/2011   TRIG 156.0* 10/03/2011   HDL 48.40 10/03/2011   LDLDIRECT 181.2 05/30/2011   LDLCALC 103* 10/03/2011   ALT 10 10/03/2011   AST 15 10/03/2011   NA 138 02/06/2012   K 4.7 02/06/2012   CL 101 02/06/2012   CREATININE 1.2 02/06/2012   BUN 29* 02/06/2012   CO2 29 02/06/2012   TSH 1.10 10/03/2011   INR 1.07 04/20/2011   HGBA1C 9.8* 02/06/2012   MICROALBUR 1.95* 02/09/2009       Assessment & Plan:

## 2012-06-03 NOTE — Patient Instructions (Signed)

## 2012-06-03 NOTE — Assessment & Plan Note (Signed)
Her BP is well controlled today 

## 2012-06-03 NOTE — Assessment & Plan Note (Signed)
She has not been taking invokana (she tells me the pharmacy never delivered it) Michelle Lopez I will check her A1C and her renal function and will advise further

## 2012-06-03 NOTE — Assessment & Plan Note (Signed)
She has tried PT and meds with no improvement I have asked her to see neurology about this

## 2012-06-05 ENCOUNTER — Encounter: Payer: Self-pay | Admitting: Internal Medicine

## 2012-06-07 LAB — ANTI-ISLET CELL ANTIBODY: Pancreatic Islet Cell Antibody: <5 {JDF'U}

## 2012-06-11 DIAGNOSIS — Z961 Presence of intraocular lens: Secondary | ICD-10-CM | POA: Diagnosis not present

## 2012-06-11 DIAGNOSIS — H1045 Other chronic allergic conjunctivitis: Secondary | ICD-10-CM | POA: Diagnosis not present

## 2012-06-11 DIAGNOSIS — H5789 Other specified disorders of eye and adnexa: Secondary | ICD-10-CM | POA: Diagnosis not present

## 2012-06-11 DIAGNOSIS — H02849 Edema of unspecified eye, unspecified eyelid: Secondary | ICD-10-CM | POA: Diagnosis not present

## 2012-07-10 DIAGNOSIS — H402290 Chronic angle-closure glaucoma, unspecified eye, stage unspecified: Secondary | ICD-10-CM | POA: Diagnosis not present

## 2012-07-10 DIAGNOSIS — H1045 Other chronic allergic conjunctivitis: Secondary | ICD-10-CM | POA: Diagnosis not present

## 2012-07-10 DIAGNOSIS — H11239 Symblepharon, unspecified eye: Secondary | ICD-10-CM | POA: Diagnosis not present

## 2012-09-16 ENCOUNTER — Other Ambulatory Visit: Payer: Self-pay | Admitting: Internal Medicine

## 2012-10-03 ENCOUNTER — Ambulatory Visit (INDEPENDENT_AMBULATORY_CARE_PROVIDER_SITE_OTHER): Payer: Medicare Other

## 2012-10-03 ENCOUNTER — Encounter: Payer: Self-pay | Admitting: Internal Medicine

## 2012-10-03 ENCOUNTER — Ambulatory Visit (INDEPENDENT_AMBULATORY_CARE_PROVIDER_SITE_OTHER): Payer: Medicare Other | Admitting: Internal Medicine

## 2012-10-03 VITALS — BP 116/68 | HR 66 | Temp 97.8°F | Resp 16 | Wt 134.8 lb

## 2012-10-03 DIAGNOSIS — E785 Hyperlipidemia, unspecified: Secondary | ICD-10-CM

## 2012-10-03 DIAGNOSIS — I10 Essential (primary) hypertension: Secondary | ICD-10-CM

## 2012-10-03 DIAGNOSIS — Z23 Encounter for immunization: Secondary | ICD-10-CM | POA: Diagnosis not present

## 2012-10-03 DIAGNOSIS — E1165 Type 2 diabetes mellitus with hyperglycemia: Secondary | ICD-10-CM

## 2012-10-03 DIAGNOSIS — Z2911 Encounter for prophylactic immunotherapy for respiratory syncytial virus (RSV): Secondary | ICD-10-CM | POA: Diagnosis not present

## 2012-10-03 DIAGNOSIS — IMO0001 Reserved for inherently not codable concepts without codable children: Secondary | ICD-10-CM

## 2012-10-03 DIAGNOSIS — E1129 Type 2 diabetes mellitus with other diabetic kidney complication: Secondary | ICD-10-CM

## 2012-10-03 LAB — FECAL OCCULT BLOOD, GUAIAC: Fecal Occult Blood: NEGATIVE

## 2012-10-03 MED ORDER — ZOSTER VACCINE LIVE 19400 UNT/0.65ML ~~LOC~~ SOLR: 0.6500 mL | Freq: Once | SUBCUTANEOUS | Status: DC

## 2012-10-03 MED ORDER — CANAGLIFLOZIN 300 MG PO TABS: 1.0000 | ORAL_TABLET | Freq: Every day | ORAL | Status: DC

## 2012-10-03 MED ORDER — SITAGLIPTIN PHOSPHATE 100 MG PO TABS: 100.0000 mg | ORAL_TABLET | Freq: Every day | ORAL | Status: DC

## 2012-10-03 NOTE — Assessment & Plan Note (Signed)
I will recheck her A1C and will address if needed 

## 2012-10-03 NOTE — Patient Instructions (Signed)
Type 2 Diabetes Mellitus, Adult Type 2 diabetes mellitus, often simply referred to as type 2 diabetes, is a long-lasting (chronic) disease. In type 2 diabetes, the pancreas does not make enough insulin (a hormone), the cells are less responsive to the insulin that is made (insulin resistance), or both. Normally, insulin moves sugars from food into the tissue cells. The tissue cells use the sugars for energy. The lack of insulin or the lack of normal response to insulin causes excess sugars to build up in the blood instead of going into the tissue cells. As a result, high blood sugar (hyperglycemia) develops. The effect of high sugar (glucose) levels can cause many complications. Type 2 diabetes was also previously called adult-onset diabetes but it can occur at any age.  RISK FACTORS  A person is predisposed to developing type 2 diabetes if someone in the family has the disease and also has one or more of the following primary risk factors:  Overweight.  An inactive lifestyle.  A history of consistently eating high-calorie foods. Maintaining a normal weight and regular physical activity can reduce the chance of developing type 2 diabetes. SYMPTOMS  A person with type 2 diabetes may not show symptoms initially. The symptoms of type 2 diabetes appear slowly. The symptoms include:  Increased thirst (polydipsia).  Increased urination (polyuria).  Increased urination during the night (nocturia).  Weight loss. This weight loss may be rapid.  Frequent, recurring infections.  Tiredness (fatigue).  Weakness.  Vision changes, such as blurred vision.  Fruity smell to your breath.  Abdominal pain.  Nausea or vomiting.  Cuts or bruises which are slow to heal.  Tingling or numbness in the hands or feet. DIAGNOSIS Type 2 diabetes is frequently not diagnosed until complications of diabetes are present. Type 2 diabetes is diagnosed when symptoms or complications are present and when blood  glucose levels are increased. Your blood glucose level may be checked by one or more of the following blood tests:  A fasting blood glucose test. You will not be allowed to eat for at least 8 hours before a blood sample is taken.  A random blood glucose test. Your blood glucose is checked at any time of the day regardless of when you ate.  A hemoglobin A1c blood glucose test. A hemoglobin A1c test provides information about blood glucose control over the previous 3 months.  An oral glucose tolerance test (OGTT). Your blood glucose is measured after you have not eaten (fasted) for 2 hours and then after you drink a glucose-containing beverage. TREATMENT   You may need to take insulin or diabetes medicine daily to keep blood glucose levels in the desired range.  You will need to match insulin dosing with exercise and healthy food choices. The treatment goal is to maintain the before meal blood sugar (preprandial glucose) level at 70 130 mg/dL. HOME CARE INSTRUCTIONS   Have your hemoglobin A1c level checked twice a year.  Perform daily blood glucose monitoring as directed by your caregiver.  Monitor urine ketones when you are ill and as directed by your caregiver.  Take your diabetes medicine or insulin as directed by your caregiver to maintain your blood glucose levels in the desired range.  Never run out of diabetes medicine or insulin. It is needed every day.  Adjust insulin based on your intake of carbohydrates. Carbohydrates can raise blood glucose levels but need to be included in your diet. Carbohydrates provide vitamins, minerals, and fiber which are an essential part of   a healthy diet. Carbohydrates are found in fruits, vegetables, whole grains, dairy products, legumes, and foods containing added sugars.    Eat healthy foods. Alternate 3 meals with 3 snacks.  Lose weight if overweight.  Carry a medical alert card or wear your medical alert jewelry.  Carry a 15 gram  carbohydrate snack with you at all times to treat low blood glucose (hypoglycemia). Some examples of 15 gram carbohydrate snacks include:  Glucose tablets, 3 or 4   Glucose gel, 15 gram tube  Raisins, 2 tablespoons (24 grams)  Jelly beans, 6  Animal crackers, 8  Regular pop, 4 ounces (120 mL)  Gummy treats, 9  Recognize hypoglycemia. Hypoglycemia occurs with blood glucose levels of 70 mg/dL and below. The risk for hypoglycemia increases when fasting or skipping meals, during or after intense exercise, and during sleep. Hypoglycemia symptoms can include:  Tremors or shakes.  Decreased ability to concentrate.  Sweating.  Increased heart rate.  Headache.  Dry mouth.  Hunger.  Irritability.  Anxiety.  Restless sleep.  Altered speech or coordination.  Confusion.  Treat hypoglycemia promptly. If you are alert and able to safely swallow, follow the 15:15 rule:  Take 15 20 grams of rapid-acting glucose or carbohydrate. Rapid-acting options include glucose gel, glucose tablets, or 4 ounces (120 mL) of fruit juice, regular soda, or low fat milk.  Check your blood glucose level 15 minutes after taking the glucose.  Take 15 20 grams more of glucose if the repeat blood glucose level is still 70 mg/dL or below.  Eat a meal or snack within 1 hour once blood glucose levels return to normal.    Be alert to polyuria and polydipsia which are early signs of hyperglycemia. An early awareness of hyperglycemia allows for prompt treatment. Treat hyperglycemia as directed by your caregiver.  Engage in at least 150 minutes of moderate-intensity physical activity a week, spread over at least 3 days of the week or as directed by your caregiver. In addition, you should engage in resistance exercise at least 2 times a week or as directed by your caregiver.  Adjust your medicine and food intake as needed if you start a new exercise or sport.  Follow your sick day plan at any time you  are unable to eat or drink as usual.  Avoid tobacco use.  Limit alcohol intake to no more than 1 drink per day for nonpregnant women and 2 drinks per day for men. You should drink alcohol only when you are also eating food. Talk with your caregiver whether alcohol is safe for you. Tell your caregiver if you drink alcohol several times a week.  Follow up with your caregiver regularly.  Schedule an eye exam soon after the diagnosis of type 2 diabetes and then annually.  Perform daily skin and foot care. Examine your skin and feet daily for cuts, bruises, redness, nail problems, bleeding, blisters, or sores. A foot exam by a caregiver should be done annually.  Brush your teeth and gums at least twice a day and floss at least once a day. Follow up with your dentist regularly.  Share your diabetes management plan with your workplace or school.  Stay up-to-date with immunizations.  Learn to manage stress.  Obtain ongoing diabetes education and support as needed.  Participate in, or seek rehabilitation as needed to maintain or improve independence and quality of life. Request a physical or occupational therapy referral if you are having foot or hand numbness or difficulties with grooming,   dressing, eating, or physical activity. SEEK MEDICAL CARE IF:   You are unable to eat food or drink fluids for more than 6 hours.  You have nausea and vomiting for more than 6 hours.  Your blood glucose level is over 240 mg/dL.  There is a change in mental status.  You develop an additional serious illness.  You have diarrhea for more than 6 hours.  You have been sick or have had a fever for a couple of days and are not getting better.  You have pain during any physical activity.  SEEK IMMEDIATE MEDICAL CARE IF:  You have difficulty breathing.  You have moderate to large ketone levels. MAKE SURE YOU:  Understand these instructions.  Will watch your condition.  Will get help right away if  you are not doing well or get worse. Document Released: 01/23/2005 Document Revised: 10/18/2011 Document Reviewed: 08/22/2011 [employer] Patient Information 2014 [employer].  

## 2012-10-03 NOTE — Progress Notes (Signed)
Subjective:    Patient ID: Michelle Lopez, female    DOB: [birth year], 67 y.o.   MRN: [id number]  Diabetes She presents for her follow-up diabetic visit. She has type 2 diabetes mellitus. Her disease course has been stable. There are no hypoglycemic associated symptoms. Pertinent negatives for hypoglycemia include no dizziness. Associated symptoms include fatigue. Pertinent negatives for diabetes include no blurred vision, no chest pain, no foot paresthesias, no foot ulcerations, no polydipsia, no polyphagia, no polyuria, no visual change, no weakness and no weight loss. There are no hypoglycemic complications. 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 Current diabetic treatment includes oral agent (triple therapy). She is compliant with treatment some of the time. Her weight is stable. She is following a generally unhealthy diet. When asked about meal planning, she reported none. She participates in exercise intermittently. There is no change in her home blood glucose trend.      Review of Systems  Constitutional: Positive for fatigue. Negative for chills, weight loss, diaphoresis and appetite change.  HENT: Negative.   Eyes: Negative.  Negative for blurred vision.  Respiratory: Negative.  Negative for cough, chest tightness, wheezing and stridor.   Cardiovascular: Negative.  Negative for chest pain, palpitations and leg swelling.  Gastrointestinal: Negative.  Negative for nausea, vomiting, abdominal pain, diarrhea and constipation.  Endocrine: Negative.  Negative for polydipsia, polyphagia and polyuria.  Genitourinary: Negative.   Musculoskeletal: Negative.   Skin: Negative.   Allergic/Immunologic: Negative.   Neurological: Negative.  Negative for dizziness, weakness, light-headedness and numbness.  Hematological: Negative.  Negative for adenopathy. Does not bruise/bleed easily.  Psychiatric/Behavioral: Negative.   All other systems reviewed and are negative.       Objective:    Physical Exam  Vitals reviewed. Constitutional: She is oriented to person, place, and time. She appears well-developed and well-nourished. No distress.  HENT:  Head: Normocephalic and atraumatic.  Nose: Nose normal.  Mouth/Throat: Oropharynx is clear and moist. No oropharyngeal exudate.  Eyes: Conjunctivae are normal. Right eye exhibits no discharge. Left eye exhibits no discharge. No scleral icterus.  Neck: Normal range of motion. Neck supple. No JVD present. No tracheal deviation present. No thyromegaly present.  Cardiovascular: Normal rate, regular rhythm, normal heart sounds and intact distal pulses.  Exam reveals no gallop and no friction rub.   No murmur heard. Pulmonary/Chest: Effort normal and breath sounds normal. No stridor. No respiratory distress. She has no wheezes. She has no rales. Chest wall is not dull to percussion. She exhibits no mass, no tenderness, no bony tenderness, no laceration, no crepitus, no edema, no deformity, no swelling and no retraction. Right breast exhibits no inverted nipple, no mass, no nipple discharge, no skin change and no tenderness. Left breast exhibits no inverted nipple, no mass, no nipple discharge, no skin change and no tenderness. Breasts are symmetrical.  Abdominal: Soft. Bowel sounds are normal. She exhibits no distension and no mass. There is no tenderness. There is no rebound and no guarding.  Genitourinary: Rectal exam shows external hemorrhoid. Rectal exam shows no internal hemorrhoid, no fissure, no mass, no tenderness and anal tone normal. Guaiac negative stool. No breast swelling, tenderness, discharge or bleeding. Pelvic exam was performed with patient supine.  Musculoskeletal: Normal range of motion. She exhibits no edema and no tenderness.  Lymphadenopathy:    She has no cervical adenopathy.  Neurological: She is oriented to person, place, and time.  Skin: Skin is warm and dry. No rash noted. She is not diaphoretic. No erythema.  No  pallor.     Lab Results  Component Value Date   WBC 6.2 10/03/2011   HGB 12.4 10/03/2011   HCT 39.1 10/03/2011   PLT 216.0 10/03/2011   GLUCOSE 166* 06/03/2012   CHOL 177 06/03/2012   TRIG 168.0* 06/03/2012   HDL 43.00 06/03/2012   LDLDIRECT 181.2 05/30/2011   LDLCALC 100* 06/03/2012   ALT 9 06/03/2012   AST 11 06/03/2012   NA 140 06/03/2012   K 4.3 06/03/2012   CL 103 06/03/2012   CREATININE 1.2 06/03/2012   BUN 22 06/03/2012   CO2 26 06/03/2012   TSH 1.10 10/03/2011   INR 1.07 04/20/2011   HGBA1C 8.2* 06/03/2012   MICROALBUR 1.95* 02/09/2009       Assessment & Plan:

## 2012-10-03 NOTE — Assessment & Plan Note (Signed)
I will recheck her FLP today She may need to add welchol or zetia

## 2012-10-03 NOTE — Assessment & Plan Note (Signed)
Her BP is well controlled Will check her lytes and renal function today

## 2012-10-04 LAB — BASIC METABOLIC PANEL
CO2: 27 mEq/L (ref 19–32)
GFR: 58.87 mL/min — ABNORMAL LOW (ref >60.00)
Glucose, Bld: 170 mg/dL — ABNORMAL HIGH (ref 70–99)
Potassium: 4.5 mEq/L (ref 3.5–5.1)
Sodium: 138 mEq/L (ref 135–145)

## 2012-10-04 LAB — LIPID PANEL
Cholesterol: 204 mg/dL — ABNORMAL HIGH (ref 0–200)
HDL: 51.4 mg/dL (ref >39.00)
Triglycerides: 131 mg/dL (ref 0.0–149.0)

## 2012-10-04 LAB — LDL CHOLESTEROL, DIRECT: Direct LDL: 140.3 mg/dL

## 2012-10-06 ENCOUNTER — Encounter: Payer: Self-pay | Admitting: Internal Medicine

## 2012-10-06 MED ORDER — COLESEVELAM HCL 3.75 G PO PACK: 1.0000 | PACK | Freq: Every day | ORAL | Status: DC

## 2012-10-06 NOTE — Addendum Note (Signed)
Addended by: Janith Lima on: 10/06/2012 01:32 PM   Modules accepted: Orders

## 2012-10-29 DIAGNOSIS — H1045 Other chronic allergic conjunctivitis: Secondary | ICD-10-CM | POA: Diagnosis not present

## 2012-10-29 DIAGNOSIS — H4011X0 Primary open-angle glaucoma, stage unspecified: Secondary | ICD-10-CM | POA: Diagnosis not present

## 2012-10-29 DIAGNOSIS — H04209 Unspecified epiphora, unspecified lacrimal gland: Secondary | ICD-10-CM | POA: Diagnosis not present

## 2012-10-29 DIAGNOSIS — H04129 Dry eye syndrome of unspecified lacrimal gland: Secondary | ICD-10-CM | POA: Diagnosis not present

## 2012-11-15 ENCOUNTER — Other Ambulatory Visit: Payer: Self-pay | Admitting: Internal Medicine

## 2013-02-24 LAB — HM DIABETES EYE EXAM

## 2013-02-25 ENCOUNTER — Telehealth: Payer: Self-pay

## 2013-02-25 NOTE — Telephone Encounter (Signed)
Please get her in for a follow up

## 2013-02-25 NOTE — Telephone Encounter (Signed)
Received pharmacy rejection stating that insurance will not cover Benicar, Januvia and Livalo. Please advise on alternatives, thanks.  Alternatives include the following  Cozaar, diovan, lisinopril Onglyza, Tradjenta Zocar, lipitor,crestor

## 2013-02-26 DIAGNOSIS — H1045 Other chronic allergic conjunctivitis: Secondary | ICD-10-CM | POA: Diagnosis not present

## 2013-02-26 DIAGNOSIS — H04129 Dry eye syndrome of unspecified lacrimal gland: Secondary | ICD-10-CM | POA: Diagnosis not present

## 2013-02-26 DIAGNOSIS — Z961 Presence of intraocular lens: Secondary | ICD-10-CM | POA: Diagnosis not present

## 2013-02-28 ENCOUNTER — Ambulatory Visit: Payer: Medicare Other | Admitting: Internal Medicine

## 2013-03-04 ENCOUNTER — Ambulatory Visit: Payer: Medicare Other | Admitting: Internal Medicine

## 2013-03-05 ENCOUNTER — Ambulatory Visit (INDEPENDENT_AMBULATORY_CARE_PROVIDER_SITE_OTHER): Payer: Medicare Other | Admitting: Internal Medicine

## 2013-03-05 ENCOUNTER — Encounter: Payer: Self-pay | Admitting: Internal Medicine

## 2013-03-05 ENCOUNTER — Other Ambulatory Visit (INDEPENDENT_AMBULATORY_CARE_PROVIDER_SITE_OTHER): Payer: Medicare Other

## 2013-03-05 VITALS — BP 110/70 | HR 84 | Temp 97.8°F | Resp 16 | Ht 60.0 in | Wt 134.0 lb

## 2013-03-05 DIAGNOSIS — L219 Seborrheic dermatitis, unspecified: Secondary | ICD-10-CM

## 2013-03-05 DIAGNOSIS — E785 Hyperlipidemia, unspecified: Secondary | ICD-10-CM

## 2013-03-05 DIAGNOSIS — E1165 Type 2 diabetes mellitus with hyperglycemia: Secondary | ICD-10-CM | POA: Diagnosis not present

## 2013-03-05 DIAGNOSIS — E1129 Type 2 diabetes mellitus with other diabetic kidney complication: Secondary | ICD-10-CM

## 2013-03-05 DIAGNOSIS — I10 Essential (primary) hypertension: Secondary | ICD-10-CM

## 2013-03-05 DIAGNOSIS — IMO0001 Reserved for inherently not codable concepts without codable children: Secondary | ICD-10-CM

## 2013-03-05 LAB — LIPID PANEL
CHOL/HDL RATIO: 4
CHOLESTEROL: 194 mg/dL (ref 0–200)
HDL: 51.2 mg/dL (ref >39.00)
LDL Cholesterol: 114 mg/dL — ABNORMAL HIGH (ref 0–99)
Triglycerides: 143 mg/dL (ref 0.0–149.0)
VLDL: 28.6 mg/dL (ref 0.0–40.0)

## 2013-03-05 LAB — BASIC METABOLIC PANEL
BUN: 23 mg/dL (ref 6–23)
CALCIUM: 9.7 mg/dL (ref 8.4–10.5)
CO2: 31 meq/L (ref 19–32)
Chloride: 99 mEq/L (ref 96–112)
Creatinine, Ser: 1.2 mg/dL (ref 0.4–1.2)
GFR: 55.5 mL/min — ABNORMAL LOW (ref >60.00)
Glucose, Bld: 192 mg/dL — ABNORMAL HIGH (ref 70–99)
Potassium: 4.6 mEq/L (ref 3.5–5.1)
SODIUM: 138 meq/L (ref 135–145)

## 2013-03-05 LAB — HEMOGLOBIN A1C: Hgb A1c MFr Bld: 7.7 % — ABNORMAL HIGH (ref 4.6–6.5)

## 2013-03-05 MED ORDER — KETOCONAZOLE 2 % EX CREA: 1.0000 "application " | TOPICAL_CREAM | Freq: Two times a day (BID) | CUTANEOUS | Status: DC

## 2013-03-05 MED ORDER — ATORVASTATIN CALCIUM 20 MG PO TABS: 20.0000 mg | ORAL_TABLET | Freq: Every day | ORAL | Status: DC

## 2013-03-05 MED ORDER — CANAGLIFLOZIN 300 MG PO TABS: 1.0000 | ORAL_TABLET | Freq: Every day | ORAL | Status: DC

## 2013-03-05 MED ORDER — LINAGLIPTIN 5 MG PO TABS: 5.0000 mg | ORAL_TABLET | Freq: Every day | ORAL | Status: DC

## 2013-03-05 MED ORDER — LOSARTAN POTASSIUM 100 MG PO TABS: 100.0000 mg | ORAL_TABLET | Freq: Every day | ORAL | Status: DC

## 2013-03-05 MED ORDER — DESONIDE 0.05 % EX LOTN
TOPICAL_LOTION | Freq: Two times a day (BID) | CUTANEOUS | Status: DC
Start: 2013-03-05 — End: 2013-04-04

## 2013-03-05 NOTE — Assessment & Plan Note (Addendum)
I will treat this with nizoral and desowen lotion

## 2013-03-05 NOTE — Assessment & Plan Note (Signed)
livalo changed to lipitor due to cost concerns FLP today to see if the statin has been effective

## 2013-03-05 NOTE — Assessment & Plan Note (Signed)
Her BP is well controlled Benicar changed to losartan due to cost concerns

## 2013-03-05 NOTE — Progress Notes (Signed)
Pre visit review using our clinic review tool, if applicable. No additional management support is needed unless otherwise documented below in the visit note. 

## 2013-03-05 NOTE — Patient Instructions (Signed)
Seborrheic Dermatitis Seborrheic dermatitis involves pink or red skin with greasy, flaky scales. This is often found on the scalp, eyebrows, nose, bearded area, and on or behind the ears. It can also occur on the central chest. It often occurs where there are more oil (sebaceous) glands. This condition is also known as dandruff. When this condition affects a baby's scalp, it is called cradle cap. It may come and go for no known reason. It can occur at any time of life from infancy to old age. CAUSES  The cause is unknown. It is not the result of too little moisture or too much oil. In some people, seborrheic dermatitis flare-ups seem to be triggered by stress. It also commonly occurs in people with certain diseases such as Parkinson's disease or HIV/AIDS. SYMPTOMS   Thick scales on the scalp.  Redness on the face or in the armpits.  The skin may seem oily or dry, but moisturizers do not help.  In infants, seborrheic dermatitis appears as scaly redness that does not seem to bother the baby. In some babies, it affects only the scalp. In others, it also affects the neck creases, armpits, groin, or behind the ears.  In adults and adolescents, seborrheic dermatitis may affect only the scalp. It may look patchy or spread out, with areas of redness and flaking. Other areas commonly affected include:  Eyebrows.  Eyelids.  Forehead.  Skin behind the ears.  Outer ears.  Chest.  Armpits.  Nose creases.  Skin creases under the breasts.  Skin between the buttocks.  Groin.  Some adults and adolescents feel itching or burning in the affected areas. DIAGNOSIS  Your caregiver can usually tell what the problem is by doing a physical exam. TREATMENT   Cortisone (steroid) ointments, creams, and lotions can help decrease inflammation.  Babies can be treated with baby oil to soften the scales, then they may be washed with baby shampoo. If this does not help, a prescription topical steroid  medicine may work.  Adults can use medicated shampoos.  Your caregiver may prescribe corticosteroid cream and shampoo containing an antifungal or yeast medicine (ketoconazole). Hydrocortisone or anti-yeast cream can be rubbed directly onto seborrheic dermatitis patches. Yeast does not cause seborrheic dermatitis, but it seems to add to the problem. In infants, seborrheic dermatitis is often worst during the first year of life. It tends to disappear on its own as the child grows. However, it may return during the teenage years. In adults and adolescents, seborrheic dermatitis tends to be a long-lasting condition that comes and goes over many years. HOME CARE INSTRUCTIONS   Use prescribed medicines as directed.  In infants, do not aggressively remove the scales or flakes on the scalp with a comb or by other means. This may lead to hair loss. SEEK MEDICAL CARE IF:   The problem does not improve from the medicated shampoos, lotions, or other medicines given by your caregiver.  You have any other questions or concerns. Document Released: 01/23/2005 Document Revised: 07/25/2011 Document Reviewed: 06/14/2009 [facility] Patient Information 2014 [employer].

## 2013-03-05 NOTE — Assessment & Plan Note (Signed)
[name] was changed to Michelle Lopez due to cost concerns Today I will check her A1C and her renal function

## 2013-03-05 NOTE — Progress Notes (Signed)
   Subjective:    Patient ID: Michelle Lopez, female    DOB: 1945/02/13, 68 y.o.   MRN: [id number]  Rash This is a recurrent problem. The current episode started 1 to 4 weeks ago. The problem is unchanged. The affected locations include the face. The rash is characterized by dryness, redness and burning. She was exposed to nothing. Pertinent negatives include no anorexia, congestion, cough, diarrhea, eye pain, facial edema, fatigue, fever, joint pain, nail changes, rhinorrhea, shortness of breath, sore throat or vomiting. Past treatments include moisturizer. The treatment provided no relief.      Review of Systems  Constitutional: Negative.  Negative for fever, chills, diaphoresis, appetite change and fatigue.  HENT: Negative.  Negative for congestion, rhinorrhea and sore throat.   Eyes: Negative.  Negative for pain.  Respiratory: Negative.  Negative for cough, choking, chest tightness, shortness of breath, wheezing and stridor.   Cardiovascular: Negative.  Negative for chest pain, palpitations and leg swelling.  Gastrointestinal: Negative.  Negative for vomiting, abdominal pain, diarrhea and anorexia.  Endocrine: Negative.   Genitourinary: Negative.   Musculoskeletal: Negative.  Negative for joint pain.  Skin: Positive for rash. Negative for nail changes.  Allergic/Immunologic: Negative.   Neurological: Negative.   Hematological: Negative.  Negative for adenopathy. Does not bruise/bleed easily.  Psychiatric/Behavioral: Negative.        Objective:   Physical Exam  Vitals reviewed. Constitutional: She is oriented to person, place, and time. She appears well-developed and well-nourished. No distress.  HENT:  Head: Normocephalic and atraumatic.    Mouth/Throat: Oropharynx is clear and moist. No oropharyngeal exudate.  Eyes: Conjunctivae are normal. Right eye exhibits no discharge. Left eye exhibits no discharge. No scleral icterus.  Neck: Normal range of motion. Neck supple. No JVD  present. No tracheal deviation present. No thyromegaly present.  Cardiovascular: Normal rate, regular rhythm, normal heart sounds and intact distal pulses.  Exam reveals no gallop and no friction rub.   No murmur heard. Pulmonary/Chest: Effort normal and breath sounds normal. No stridor. No respiratory distress. She has no wheezes. She has no rales. She exhibits no tenderness.  Abdominal: Soft. Bowel sounds are normal. She exhibits no distension and no mass. There is no tenderness. There is no rebound and no guarding.  Musculoskeletal: Normal range of motion. She exhibits no edema and no tenderness.  Lymphadenopathy:    She has no cervical adenopathy.  Neurological: She is oriented to person, place, and time.  Skin: Skin is warm and dry. No rash noted. She is not diaphoretic. No erythema. No pallor.  Psychiatric: She has a normal mood and affect. Her behavior is normal. Judgment and thought content normal.     Lab Results  Component Value Date   WBC 6.2 10/03/2011   HGB 12.4 10/03/2011   HCT 39.1 10/03/2011   PLT 216.0 10/03/2011   GLUCOSE 170* 10/03/2012   CHOL 204* 10/03/2012   TRIG 131.0 10/03/2012   HDL 51.40 10/03/2012   LDLDIRECT 140.3 10/03/2012   LDLCALC 100* 06/03/2012   ALT 9 06/03/2012   AST 11 06/03/2012   NA 138 10/03/2012   K 4.5 10/03/2012   CL 101 10/03/2012   CREATININE 1.2 10/03/2012   BUN 21 10/03/2012   CO2 27 10/03/2012   TSH 1.10 10/03/2011   INR 1.07 04/20/2011   HGBA1C 7.7* 10/03/2012   MICROALBUR 1.95* 02/09/2009       Assessment & Plan:

## 2013-03-18 ENCOUNTER — Observation Stay (HOSPITAL_COMMUNITY)
Admission: EM | Admit: 2013-03-18 | Discharge: 2013-03-21 | Disposition: A | Discharge status: Home / Self Care | Payer: Medicare Other | Attending: Internal Medicine | Admitting: Internal Medicine

## 2013-03-18 ENCOUNTER — Encounter (HOSPITAL_COMMUNITY): Payer: Self-pay | Admitting: Emergency Medicine

## 2013-03-18 ENCOUNTER — Emergency Department (HOSPITAL_COMMUNITY): Payer: Medicare Other

## 2013-03-18 DIAGNOSIS — R404 Transient alteration of awareness: Secondary | ICD-10-CM | POA: Diagnosis not present

## 2013-03-18 DIAGNOSIS — E1165 Type 2 diabetes mellitus with hyperglycemia: Secondary | ICD-10-CM | POA: Diagnosis not present

## 2013-03-18 DIAGNOSIS — Z888 Allergy status to other drugs, medicaments and biological substances status: Secondary | ICD-10-CM | POA: Insufficient documentation

## 2013-03-18 DIAGNOSIS — Z794 Long term (current) use of insulin: Secondary | ICD-10-CM | POA: Insufficient documentation

## 2013-03-18 DIAGNOSIS — I2089 Other forms of angina pectoris: Secondary | ICD-10-CM | POA: Diagnosis present

## 2013-03-18 DIAGNOSIS — R0609 Other forms of dyspnea: Secondary | ICD-10-CM | POA: Insufficient documentation

## 2013-03-18 DIAGNOSIS — R42 Dizziness and giddiness: Secondary | ICD-10-CM | POA: Diagnosis present

## 2013-03-18 DIAGNOSIS — I6509 Occlusion and stenosis of unspecified vertebral artery: Secondary | ICD-10-CM | POA: Insufficient documentation

## 2013-03-18 DIAGNOSIS — R1310 Dysphagia, unspecified: Secondary | ICD-10-CM | POA: Insufficient documentation

## 2013-03-18 DIAGNOSIS — E1129 Type 2 diabetes mellitus with other diabetic kidney complication: Secondary | ICD-10-CM | POA: Diagnosis not present

## 2013-03-18 DIAGNOSIS — R4789 Other speech disturbances: Secondary | ICD-10-CM | POA: Insufficient documentation

## 2013-03-18 DIAGNOSIS — I208 Other forms of angina pectoris: Secondary | ICD-10-CM

## 2013-03-18 DIAGNOSIS — Z8673 Personal history of transient ischemic attack (TIA), and cerebral infarction without residual deficits: Secondary | ICD-10-CM | POA: Diagnosis not present

## 2013-03-18 DIAGNOSIS — F1411 Cocaine abuse, in remission: Secondary | ICD-10-CM | POA: Insufficient documentation

## 2013-03-18 DIAGNOSIS — R0989 Other specified symptoms and signs involving the circulatory and respiratory systems: Secondary | ICD-10-CM | POA: Diagnosis not present

## 2013-03-18 DIAGNOSIS — I10 Essential (primary) hypertension: Secondary | ICD-10-CM | POA: Diagnosis present

## 2013-03-18 DIAGNOSIS — H814 Vertigo of central origin: Secondary | ICD-10-CM | POA: Insufficient documentation

## 2013-03-18 DIAGNOSIS — Z7902 Long term (current) use of antithrombotics/antiplatelets: Secondary | ICD-10-CM | POA: Diagnosis not present

## 2013-03-18 DIAGNOSIS — N058 Unspecified nephritic syndrome with other morphologic changes: Secondary | ICD-10-CM | POA: Diagnosis not present

## 2013-03-18 DIAGNOSIS — R0602 Shortness of breath: Secondary | ICD-10-CM | POA: Diagnosis not present

## 2013-03-18 DIAGNOSIS — Z79899 Other long term (current) drug therapy: Secondary | ICD-10-CM | POA: Insufficient documentation

## 2013-03-18 DIAGNOSIS — I6502 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: Secondary | ICD-10-CM | POA: Diagnosis present

## 2013-03-18 DIAGNOSIS — G319 Degenerative disease of nervous system, unspecified: Secondary | ICD-10-CM | POA: Insufficient documentation

## 2013-03-18 DIAGNOSIS — R0600 Dyspnea, unspecified: Secondary | ICD-10-CM | POA: Diagnosis present

## 2013-03-18 DIAGNOSIS — J984 Other disorders of lung: Secondary | ICD-10-CM | POA: Diagnosis not present

## 2013-03-18 DIAGNOSIS — I209 Angina pectoris, unspecified: Secondary | ICD-10-CM | POA: Diagnosis not present

## 2013-03-18 DIAGNOSIS — F0390 Unspecified dementia without behavioral disturbance: Secondary | ICD-10-CM

## 2013-03-18 DIAGNOSIS — G9389 Other specified disorders of brain: Secondary | ICD-10-CM | POA: Diagnosis not present

## 2013-03-18 DIAGNOSIS — Z7982 Long term (current) use of aspirin: Secondary | ICD-10-CM | POA: Diagnosis not present

## 2013-03-18 DIAGNOSIS — E785 Hyperlipidemia, unspecified: Secondary | ICD-10-CM | POA: Diagnosis not present

## 2013-03-18 HISTORY — DX: Cerebral infarction, unspecified: I63.9

## 2013-03-18 LAB — POCT I-STAT TROPONIN I: Troponin i, poc: 0 ng/mL (ref 0.00–0.08)

## 2013-03-18 LAB — CBC WITH DIFFERENTIAL/PLATELET
BASOS PCT: 0 % (ref 0–1)
Basophils Absolute: 0 10*3/uL (ref 0.0–0.1)
EOS ABS: 0 10*3/uL (ref 0.0–0.7)
EOS PCT: 0 % (ref 0–5)
HCT: 41.1 % (ref 36.0–46.0)
HEMOGLOBIN: 13.3 g/dL (ref 12.0–15.0)
Lymphocytes Relative: 30 % (ref 12–46)
Lymphs Abs: 2.6 10*3/uL (ref 0.7–4.0)
MCH: 26.7 pg (ref 26.0–34.0)
MCHC: 32.4 g/dL (ref 30.0–36.0)
MCV: 82.4 fL (ref 78.0–100.0)
MONOS PCT: 6 % (ref 3–12)
Monocytes Absolute: 0.5 10*3/uL (ref 0.1–1.0)
NEUTROS PCT: 64 % (ref 43–77)
Neutro Abs: 5.6 10*3/uL (ref 1.7–7.7)
Platelets: 262 10*3/uL (ref 150–400)
RBC: 4.99 MIL/uL (ref 3.87–5.11)
RDW: 13.4 % (ref 11.5–15.5)
WBC: 8.7 10*3/uL (ref 4.0–10.5)

## 2013-03-18 LAB — BASIC METABOLIC PANEL
BUN: 25 mg/dL — ABNORMAL HIGH (ref 6–23)
CALCIUM: 9.3 mg/dL (ref 8.4–10.5)
CO2: 25 mEq/L (ref 19–32)
Chloride: 98 mEq/L (ref 96–112)
Creatinine, Ser: 1.21 mg/dL — ABNORMAL HIGH (ref 0.50–1.10)
GFR, EST AFRICAN AMERICAN: 51 mL/min — AB (ref >90)
GFR, EST NON AFRICAN AMERICAN: 44 mL/min — AB (ref >90)
Glucose, Bld: 139 mg/dL — ABNORMAL HIGH (ref 70–99)
POTASSIUM: 4.5 meq/L (ref 3.7–5.3)
Sodium: 136 mEq/L — ABNORMAL LOW (ref 137–147)

## 2013-03-18 LAB — GLUCOSE, CAPILLARY: Glucose-Capillary: 140 mg/dL — ABNORMAL HIGH (ref 70–99)

## 2013-03-18 MED ORDER — SODIUM CHLORIDE 0.9 % IV BOLUS (SEPSIS)
500.0000 mL | Freq: Once | INTRAVENOUS | Status: AC
  Administered 2013-03-18: 500 mL via INTRAVENOUS

## 2013-03-18 NOTE — H&P (Signed)
[facility]                                                                                    Patient Demographics  Michelle Lopez, is a 68 y.o. female  CSN: [id number]  MRN: [id number]  DOB - [birth year]  Admit Date - 03/18/2013  Outpatient Primary MD for the patient is Scarlette Calico, MD   With History of -  Past Medical History  Diagnosis Date  . Hyperlipidemia   . Diabetes mellitus     type 2, uncontrolled  . Essential hypertension, benign   . Cocaine abuse, unspecified   . Diarrhea   . Cerebrovascular disease, unspecified   . Stroke     x7      Past Surgical History  Procedure Laterality Date  . Total hip arthroplasty  1970    Dr. Rodell Perna    in for   Chief Complaint  Patient presents with  . Dizziness     HPI  Michelle Lopez  is a 68 y.o. female, with past medical history significant for diabetes mellitus, cocaine abuse and hypertension presenting today with few hours history of dyspnea and diaphoresis in addition to dizziness . No nausea vomiting or diarrhea. No frank chest pain. The episode started when the patient was cooking between 5 and 6 PM . No history of leg swelling or PND's . I was called to admit patient for angina equivalent    Review of Systems    In addition to the HPI above,  No Fever-chills, No Headache, No changes with Vision or hearing, No problems swallowing food or Liquids, No Chest pain,  No Abdominal pain, No Nausea or Vommitting, Bowel movements are regular, No Blood in stool or Urine, No dysuria, No new skin rashes or bruises, No new joints pains-aches,  No new weakness, tingling, numbness in any extremity, No recent weight gain or loss, No polyuria, polydypsia or polyphagia, No significant Mental Stressors.  A full 10 point Review of Systems was done, except as stated above, all other Review of Systems were negative.   Social History History  Substance Use Topics  . Smoking status: Never Smoker   .  Smokeless tobacco: Never Used  . Alcohol Use: No    Family History Family History  Problem Relation Age of Onset  . Colon cancer Neg Hx      Prior to Admission medications   Medication Sig Start Date End Date Taking? Authorizing Provider  aspirin 81 MG tablet Take 81 mg by mouth daily.     Yes Historical Provider, MD  atorvastatin (LIPITOR) 20 MG tablet Take 1 tablet (20 mg total) by mouth daily. 03/05/13  Yes Janith Lima, MD  Canagliflozin (INVOKANA) 300 MG TABS Take 1 tablet (300 mg total) by mouth daily. 03/05/13  Yes Janith Lima, MD  clopidogrel (PLAVIX) 75 MG tablet Take 75 mg by mouth daily.   Yes Historical Provider, MD  desonide (DESOWEN) 0.05 % lotion Apply topically 2 (two) times daily. 03/05/13  Yes Janith Lima, MD  hydrochlorothiazide (MICROZIDE) 12.5 MG capsule Take 12.5 mg by mouth daily.   Yes Historical Provider, MD  ketoconazole (NIZORAL)  2 % cream Apply 1 application topically 2 (two) times daily. 03/05/13  Yes Janith Lima, MD  metFORMIN (GLUCOPHAGE) 1000 MG tablet Take 1,000 mg by mouth 2 (two) times daily with a meal.   Yes Historical Provider, MD  PRODIGY NO CODING BLOOD GLUC test strip USE TO CHECK BLOOD SUGAR TWICE DAILY 07/19/11  Yes Janith Lima, MD  PRODIGY TWIST TOP LANCETS 28G MISC USE TO CHECK BLOOD SUGAR TWICE DAILY 10/31/10  Yes Janith Lima, MD  tamsulosin (FLOMAX) 0.4 MG CAPS capsule Take 0.4 mg by mouth daily.   Yes Historical Provider, MD  linagliptin (TRADJENTA) 5 MG TABS tablet Take 1 tablet (5 mg total) by mouth daily. 03/05/13   Janith Lima, MD  losartan (COZAAR) 100 MG tablet Take 1 tablet (100 mg total) by mouth daily. 03/05/13   Janith Lima, MD    Allergies  Allergen Reactions  . Crestor [Rosuvastatin Calcium]     myalgias  . Morphine Hives  . Pravastatin Sodium Rash  . Simvastatin Rash    Physical Exam  Vitals  Blood pressure 146/67, temperature 98.6 F (37 C), temperature source Oral, resp. rate 22, SpO2  99.00%.   1. General a very pleasant African American female in no significant distress  2. Normal affect and insight, Not Suicidal or Homicidal, Awake Alert, Oriented X 3.  3. No F.N deficits, ALL C.Nerves Intact, Strength 5/5 all 4 extremities, Sensation intact all 4 extremities, Plantars down going.  4. Ears and Eyes appear Normal, Conjunctivae clear, PERRLA. Moist Oral Mucosa.  5. Supple Neck, No JVD, No cervical lymphadenopathy appriciated, No Carotid Bruits.  6. Symmetrical Chest wall movement, Good air movement bilaterally, CTAB.  7. RRR, No Gallops, Rubs or Murmurs, No Parasternal Heave.  8. Positive Bowel Sounds, Abdomen Soft, Non tender, No organomegaly appriciated,No rebound -guarding or rigidity.  9.  No Cyanosis, Normal Skin Turgor, No Skin Rash or Bruise.  10. Good muscle tone,  joints appear normal , no effusions, Normal ROM.  11. No Palpable Lymph Nodes in Neck or Axillae    Data Review  CBC  Recent Labs Lab 03/18/13 2143  WBC 8.7  HGB 13.3  HCT 41.1  PLT 262  MCV 82.4  MCH 26.7  MCHC 32.4  RDW 13.4  LYMPHSABS 2.6  MONOABS 0.5  EOSABS 0.0  BASOSABS 0.0   ------------------------------------------------------------------------------------------------------------------  Chemistries   Recent Labs Lab 03/18/13 2143  NA 136*  K 4.5  CL 98  CO2 25  GLUCOSE 139*  BUN 25*  CREATININE 1.21*  CALCIUM 9.3     Imaging results:   Dg Chest 2 View  03/18/2013   CLINICAL DATA:  Short of breath and weakness.  EXAM: CHEST  2 VIEW  COMPARISON:  DG CHEST 2 VIEW dated 04/21/2011; DG CHEST 1 VIEW dated 04/20/2011; DG CHEST 2 VIEW dated 12/14/2009; CT ANGIO CHEST W/CM &/OR WO/CM dated 12/14/2009  FINDINGS: Cardiopericardial silhouette within normal limits. Monitoring leads project over the chest. No airspace disease. Chronic scarring is present at the left lung base. Scattered bilateral calcified granulomata.  IMPRESSION: No acute abnormality. Chronic scarring  at the left lung base and old granulomatous disease.   Electronically Signed   By: Dereck Ligas M.D.   On: 03/18/2013 22:37   Ct Head Wo Contrast  03/18/2013   CLINICAL DATA:  Vertigo.  EXAM: CT HEAD WITHOUT CONTRAST  TECHNIQUE: Contiguous axial images were obtained from the base of the skull through the vertex without intravenous contrast.  COMPARISON:  CT HEAD W/O CM dated 04/20/2011  FINDINGS: No mass lesion, mass effect, midline shift, hydrocephalus, hemorrhage. No acute territorial cortical ischemia/infarct. Atrophy and chronic ischemic white matter disease is present. Posterior fossa structures appears similar to prior with a small left cerebellar hemisphere lacunar infarct. No posterior fossa mass lesions near the costophrenic angle identified on noncontrast CT. Intracranial atherosclerosis is present. The paranasal sinuses appear within normal limits. Mastoid air cells show either tiny amount of fluid or partial volume averaging on the left.  IMPRESSION: 1. No acute intracranial abnormality. 2. Atrophy and chronic ischemic white matter disease. Atrophy is age appropriate. 3. 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   Electronically Signed   By: Dereck Ligas M.D.   On: 03/18/2013 21:22    My personal review of EKG: Left axis deviation, normal sinus rhythm    Assessment & Plan  1. angina equivalent with dyspnea and cold sweats normal EKG 2. Diabetes mellitus 3. History of cocaine abuse in the past 4. history of hyperlipidemia  Plan Rule out MI Continue with medications Consider CT angiogram if rules out for MI.      DVT Prophylaxis Lovenox  AM Labs Ordered, also please review Full Orders  Family Communication: Admission, patients condition and plan of care including tests being ordered have been discussed with the patient and daughter who indicate understanding and agree with the plan and Code Status.  Code Status full  Disposition Plan: Home  Time spent in minutes : 31  min  Condition GUARDED

## 2013-03-18 NOTE — ED Notes (Signed)
Pt BIB EMS, previous hx of x7 strokes, pt states she has been dizzy for the past year constantly since that time, but tonight at [place] she was cooking dinner and began to feel increased dizziness, became diaphoretic and had blurred vision, which she does not usually have. Pt sat down and called her children who called EMS. Per EMS pt was negative for the stroke screen. Pt reports hx of left sided weakness d/t strokes, which is noted at this time.  Pt a&o x4, MD made aware of pt symptoms.

## 2013-03-18 NOTE — ED Notes (Signed)
Unsuccessful IV attempt x2 at this time

## 2013-03-18 NOTE — ED Notes (Signed)
Pt to CT at this time.

## 2013-03-18 NOTE — ED Notes (Signed)
Bed: [id number] Expected date:  Expected time:  Means of arrival:  Comments: dizziness

## 2013-03-18 NOTE — ED Provider Notes (Signed)
TIME SEEN: 8:00 PM  CHIEF COMPLAINT: Dizziness, shortness of breath  HPI: Patient is a 68 year old female with a history of hypertension, diabetes, hyperlipidemia, prior stroke who presents the emergency department with increased vertiginous symptoms and shortness of breath that started at approximately 5:30 PM. She reports that she has had one year of vertigo and lightheadedness that she has seen several doctors for. She reports a 90 came worse. She also feels short of breath and had mild blurry vision and diaphoresis. No chest pain. No numbness, tingling or focal weakness. No headache. No history of head injury. She reports that she has had strokes in the past, she did not have similar symptoms. She does have residual left-sided weakness due to her strokes.  ROS: See HPI Constitutional: no fever  Eyes: no drainage  ENT: no runny nose   Cardiovascular:  no chest pain  Resp:  SOB  GI: no vomiting GU: no dysuria Integumentary: no rash  Allergy: no hives  Musculoskeletal: no leg swelling  Neurological: no slurred speech ROS otherwise negative  PAST MEDICAL HISTORY/PAST SURGICAL HISTORY:  Past Medical History  Diagnosis Date  . Hyperlipidemia   . Diabetes mellitus     type 2, uncontrolled  . Essential hypertension, benign   . Cocaine abuse, unspecified   . Diarrhea   . Cerebrovascular disease, unspecified   . Stroke     x7    MEDICATIONS:  Prior to Admission medications   Medication Sig Start Date End Date Taking? Authorizing Provider  aspirin 81 MG tablet Take 81 mg by mouth daily.     Yes Historical Provider, MD  atorvastatin (LIPITOR) 20 MG tablet Take 1 tablet (20 mg total) by mouth daily. 03/05/13  Yes Janith Lima, MD  Canagliflozin (INVOKANA) 300 MG TABS Take 1 tablet (300 mg total) by mouth daily. 03/05/13  Yes Janith Lima, MD  clopidogrel (PLAVIX) 75 MG tablet Take 75 mg by mouth daily.   Yes Historical Provider, MD  desonide (DESOWEN) 0.05 % lotion Apply topically 2  (two) times daily. 03/05/13  Yes Janith Lima, MD  hydrochlorothiazide (MICROZIDE) 12.5 MG capsule Take 12.5 mg by mouth daily.   Yes Historical Provider, MD  ketoconazole (NIZORAL) 2 % cream Apply 1 application topically 2 (two) times daily. 03/05/13  Yes Janith Lima, MD  metFORMIN (GLUCOPHAGE) 1000 MG tablet Take 1,000 mg by mouth 2 (two) times daily with a meal.   Yes Historical Provider, MD  PRODIGY NO CODING BLOOD GLUC test strip USE TO CHECK BLOOD SUGAR TWICE DAILY 07/19/11  Yes Janith Lima, MD  PRODIGY TWIST TOP LANCETS 28G MISC USE TO CHECK BLOOD SUGAR TWICE DAILY 10/31/10  Yes Janith Lima, MD  tamsulosin (FLOMAX) 0.4 MG CAPS capsule Take 0.4 mg by mouth daily.   Yes Historical Provider, MD  linagliptin (TRADJENTA) 5 MG TABS tablet Take 1 tablet (5 mg total) by mouth daily. 03/05/13   Janith Lima, MD  losartan (COZAAR) 100 MG tablet Take 1 tablet (100 mg total) by mouth daily. 03/05/13   Janith Lima, MD    ALLERGIES:  Allergies  Allergen Reactions  . Crestor [Rosuvastatin Calcium]     myalgias  . Morphine Hives  . Pravastatin Sodium Rash  . Simvastatin Rash    SOCIAL HISTORY:  History  Substance Use Topics  . Smoking status: Never Smoker   . Smokeless tobacco: Never Used  . Alcohol Use: No    FAMILY HISTORY: Family History  Problem Relation  Age of Onset  . Colon cancer Neg Hx     EXAM: BP 114/63  Temp(Src) 98.6 F (37 C) (Oral)  Resp 14  SpO2 99% CONSTITUTIONAL: Alert and oriented and responds appropriately to questions. Well-appearing; well-nourished HEAD: Normocephalic EYES: Conjunctivae clear, PERRL ENT: normal nose; no rhinorrhea; moist mucous membranes; pharynx without lesions noted; TMs are clear bilaterally NECK: Supple, no meningismus, no LAD  CARD: RRR; S1 and S2 appreciated; no murmurs, no clicks, no rubs, no gallops RESP: Normal chest excursion without splinting or tachypnea; breath sounds clear and equal bilaterally; no wheezes, no  rhonchi, no rales,  ABD/GI: Normal bowel sounds; non-distended; soft, non-tender, no rebound, no guarding BACK:  The back appears normal and is non-tender to palpation, there is no CVA tenderness EXT: Normal ROM in all joints; non-tender to palpation; no edema; normal capillary refill; no cyanosis    SKIN: Normal color for age and race; warm NEURO: Cranial nerves II through XII intact, sensation to light touch intact diffusely, left upper and lower extremity weakness 4/5 compared to 5/5 strength in her right upper and lower extremity which is baseline, no dysmetria finger-nose testing PSYCH: The patient's mood and manner are appropriate. Grooming and personal hygiene are appropriate.  MEDICAL DECISION MAKING: Patient here with worsening of her chronic vertiginous symptoms associated with shortness of breath, diaphoresis and blurry vision. She is currently hemodynamically stable and at her neurologic baseline. Given she has no focal neurologic deficits currently he is not complaining of vertigo at this time, I am not concerned for stroke. Will obtain cardiac labs, head CT to rule out bleed given patient is on Plavix. Patient will likely need admission.  ED PROGRESS: Patient's labs are unremarkable. Her troponin is negative. She does have a mildly elevated creatinine 1.21 but this is chronic.chest x-ray shows no acute abnormality. Head CT shows chronic ischemic changes but no new infarcts. Given her episode of shortness of breath, diaphoresis and dizziness earlier today, will admit for ACS workup.   EKG Interpretation    Date/Time:  Tuesday March 18 2013 19:40:14 EST Ventricular Rate:  92 PR Interval:  140 QRS Duration: 92 QT Interval:  368 QTC Calculation: 455 R Axis:   -35 Text Interpretation:  Age not entered, assumed to be  67 years old for purpose of ECG interpretation Sinus rhythm Left axis deviation Consider anterior infarct Confirmed by WARD  DO, KRISTEN [id number]) on 03/18/2013 7:49:40  PM             [place], DO 03/18/13 2314

## 2013-03-18 NOTE — ED Notes (Signed)
Pt returned from CT at this time.  

## 2013-03-18 NOTE — ED Notes (Signed)
MD at bedside at this time.

## 2013-03-19 ENCOUNTER — Observation Stay (HOSPITAL_COMMUNITY): Payer: Medicare Other

## 2013-03-19 ENCOUNTER — Encounter (HOSPITAL_COMMUNITY): Payer: Self-pay | Admitting: *Deleted

## 2013-03-19 DIAGNOSIS — G9389 Other specified disorders of brain: Secondary | ICD-10-CM | POA: Diagnosis not present

## 2013-03-19 DIAGNOSIS — R42 Dizziness and giddiness: Secondary | ICD-10-CM

## 2013-03-19 DIAGNOSIS — I369 Nonrheumatic tricuspid valve disorder, unspecified: Secondary | ICD-10-CM

## 2013-03-19 DIAGNOSIS — R0609 Other forms of dyspnea: Secondary | ICD-10-CM | POA: Diagnosis not present

## 2013-03-19 DIAGNOSIS — I209 Angina pectoris, unspecified: Secondary | ICD-10-CM | POA: Diagnosis not present

## 2013-03-19 DIAGNOSIS — R0989 Other specified symptoms and signs involving the circulatory and respiratory systems: Secondary | ICD-10-CM | POA: Diagnosis not present

## 2013-03-19 LAB — CREATININE, SERUM
Creatinine, Ser: 1.23 mg/dL — ABNORMAL HIGH (ref 0.50–1.10)
GFR calc Af Amer: 50 mL/min — ABNORMAL LOW (ref >90)
GFR, EST NON AFRICAN AMERICAN: 43 mL/min — AB (ref >90)

## 2013-03-19 LAB — CBC
HCT: 38.5 % (ref 36.0–46.0)
Hemoglobin: 12.3 g/dL (ref 12.0–15.0)
MCH: 26.2 pg (ref 26.0–34.0)
MCHC: 31.9 g/dL (ref 30.0–36.0)
MCV: 81.9 fL (ref 78.0–100.0)
PLATELETS: 246 10*3/uL (ref 150–400)
RBC: 4.7 MIL/uL (ref 3.87–5.11)
RDW: 13.3 % (ref 11.5–15.5)
WBC: 8.5 10*3/uL (ref 4.0–10.5)

## 2013-03-19 LAB — URINALYSIS, ROUTINE W REFLEX MICROSCOPIC
Bilirubin Urine: NEGATIVE
Hgb urine dipstick: NEGATIVE
KETONES UR: 15 mg/dL — AB
LEUKOCYTES UA: NEGATIVE
Nitrite: NEGATIVE
PH: 5 (ref 5.0–8.0)
Protein, ur: NEGATIVE mg/dL
Specific Gravity, Urine: 1.024 (ref 1.005–1.030)
Urobilinogen, UA: 0.2 mg/dL (ref 0.0–1.0)

## 2013-03-19 LAB — URINE MICROSCOPIC-ADD ON

## 2013-03-19 LAB — TROPONIN I
Troponin I: <0.3 ng/mL (ref <0.30)
Troponin I: <0.3 ng/mL (ref <0.30)
Troponin I: <0.3 ng/mL (ref <0.30)

## 2013-03-19 LAB — GLUCOSE, CAPILLARY: GLUCOSE-CAPILLARY: 121 mg/dL — AB (ref 70–99)

## 2013-03-19 MED ORDER — ONDANSETRON HCL 4 MG PO TABS: 4.0000 mg | ORAL_TABLET | Freq: Four times a day (QID) | ORAL | Status: DC | PRN

## 2013-03-19 MED ORDER — LOSARTAN POTASSIUM 50 MG PO TABS
100.0000 mg | ORAL_TABLET | Freq: Every day | ORAL | Status: DC
  Administered 2013-03-19 – 2013-03-21 (×3): 100 mg via ORAL
  Filled 2013-03-19 (×3): qty 2

## 2013-03-19 MED ORDER — SODIUM CHLORIDE 0.9 % IV SOLN: 250.0000 mL | INTRAVENOUS | Status: DC | PRN

## 2013-03-19 MED ORDER — SODIUM CHLORIDE 0.9 % IJ SOLN: 3.0000 mL | INTRAMUSCULAR | Status: DC | PRN

## 2013-03-19 MED ORDER — HYDROMORPHONE HCL PF 1 MG/ML IJ SOLN: 0.5000 mg | INTRAMUSCULAR | Status: DC | PRN

## 2013-03-19 MED ORDER — INSULIN ASPART 100 UNIT/ML ~~LOC~~ SOLN
0.0000 [IU] | Freq: Three times a day (TID) | SUBCUTANEOUS | Status: DC
  Administered 2013-03-20: 3 [IU] via SUBCUTANEOUS
  Administered 2013-03-20: 2 [IU] via SUBCUTANEOUS
  Administered 2013-03-20: 1 [IU] via SUBCUTANEOUS
  Administered 2013-03-21: 2 [IU] via SUBCUTANEOUS

## 2013-03-19 MED ORDER — CLOPIDOGREL BISULFATE 75 MG PO TABS
75.0000 mg | ORAL_TABLET | Freq: Every day | ORAL | Status: DC
  Administered 2013-03-19 – 2013-03-21 (×3): 75 mg via ORAL
  Filled 2013-03-19 (×3): qty 1

## 2013-03-19 MED ORDER — METFORMIN HCL 500 MG PO TABS
1000.0000 mg | ORAL_TABLET | Freq: Two times a day (BID) | ORAL | Status: DC
  Administered 2013-03-19 (×2): 1000 mg via ORAL
  Filled 2013-03-19 (×3): qty 2

## 2013-03-19 MED ORDER — GADOBENATE DIMEGLUMINE 529 MG/ML IV SOLN
12.0000 mL | Freq: Once | INTRAVENOUS | Status: AC | PRN
  Administered 2013-03-19: 12 mL via INTRAVENOUS

## 2013-03-19 MED ORDER — SODIUM CHLORIDE 0.9 % IJ SOLN
3.0000 mL | Freq: Two times a day (BID) | INTRAMUSCULAR | Status: DC
  Administered 2013-03-19 – 2013-03-21 (×3): 3 mL via INTRAVENOUS

## 2013-03-19 MED ORDER — HYDROCODONE-ACETAMINOPHEN 5-325 MG PO TABS: 1.0000 | ORAL_TABLET | ORAL | Status: DC | PRN

## 2013-03-19 MED ORDER — HYDROCORTISONE 1 % EX CREA
1.0000 "application " | TOPICAL_CREAM | Freq: Two times a day (BID) | CUTANEOUS | Status: DC
  Filled 2013-03-19: qty 28

## 2013-03-19 MED ORDER — MORPHINE SULFATE 2 MG/ML IJ SOLN: 2.0000 mg | INTRAMUSCULAR | Status: DC | PRN

## 2013-03-19 MED ORDER — ALBUTEROL SULFATE (2.5 MG/3ML) 0.083% IN NEBU
2.5000 mg | INHALATION_SOLUTION | Freq: Four times a day (QID) | RESPIRATORY_TRACT | Status: DC
  Administered 2013-03-19 (×3): 2.5 mg via RESPIRATORY_TRACT
  Filled 2013-03-19 (×4): qty 3

## 2013-03-19 MED ORDER — ZOLPIDEM TARTRATE 5 MG PO TABS
5.0000 mg | ORAL_TABLET | Freq: Every evening | ORAL | Status: DC | PRN
Start: 2013-03-19 — End: 2013-03-21

## 2013-03-19 MED ORDER — ACETAMINOPHEN 325 MG PO TABS: 650.0000 mg | ORAL_TABLET | Freq: Four times a day (QID) | ORAL | Status: DC | PRN

## 2013-03-19 MED ORDER — LINAGLIPTIN 5 MG PO TABS
5.0000 mg | ORAL_TABLET | Freq: Every day | ORAL | Status: DC
  Administered 2013-03-19 – 2013-03-21 (×3): 5 mg via ORAL
  Filled 2013-03-19 (×4): qty 1

## 2013-03-19 MED ORDER — ATORVASTATIN CALCIUM 20 MG PO TABS
20.0000 mg | ORAL_TABLET | Freq: Every day | ORAL | Status: DC
  Administered 2013-03-19 – 2013-03-20 (×2): 20 mg via ORAL
  Filled 2013-03-19 (×3): qty 1

## 2013-03-19 MED ORDER — HYDROCHLOROTHIAZIDE 12.5 MG PO CAPS
12.5000 mg | ORAL_CAPSULE | Freq: Every day | ORAL | Status: DC
Start: 2013-03-19 — End: 2013-03-21
  Administered 2013-03-19 – 2013-03-21 (×3): 12.5 mg via ORAL
  Filled 2013-03-19 (×3): qty 1

## 2013-03-19 MED ORDER — TAMSULOSIN HCL 0.4 MG PO CAPS
0.4000 mg | ORAL_CAPSULE | Freq: Every day | ORAL | Status: DC
  Administered 2013-03-19 – 2013-03-21 (×3): 0.4 mg via ORAL
  Filled 2013-03-19 (×3): qty 1

## 2013-03-19 MED ORDER — HEPARIN SODIUM (PORCINE) 5000 UNIT/ML IJ SOLN
5000.0000 [IU] | Freq: Three times a day (TID) | INTRAMUSCULAR | Status: DC
  Administered 2013-03-19 – 2013-03-21 (×7): 5000 [IU] via SUBCUTANEOUS
  Filled 2013-03-19 (×10): qty 1

## 2013-03-19 MED ORDER — SODIUM CHLORIDE 0.9 % IJ SOLN
3.0000 mL | Freq: Two times a day (BID) | INTRAMUSCULAR | Status: DC
  Administered 2013-03-19 – 2013-03-20 (×3): 3 mL via INTRAVENOUS

## 2013-03-19 MED ORDER — ACETAMINOPHEN 650 MG RE SUPP
650.0000 mg | Freq: Four times a day (QID) | RECTAL | Status: DC | PRN
Start: 2013-03-19 — End: 2013-03-21

## 2013-03-19 MED ORDER — CANAGLIFLOZIN 300 MG PO TABS
1.0000 | ORAL_TABLET | Freq: Every day | ORAL | Status: DC
  Administered 2013-03-19 – 2013-03-21 (×3): 300 mg via ORAL
  Filled 2013-03-19 (×3): qty 1

## 2013-03-19 MED ORDER — ONDANSETRON HCL 4 MG/2ML IJ SOLN: 4.0000 mg | Freq: Four times a day (QID) | INTRAMUSCULAR | Status: DC | PRN

## 2013-03-19 MED ORDER — ASPIRIN EC 81 MG PO TBEC
81.0000 mg | DELAYED_RELEASE_TABLET | Freq: Every day | ORAL | Status: DC
  Administered 2013-03-19 – 2013-03-21 (×3): 81 mg via ORAL
  Filled 2013-03-19 (×3): qty 1

## 2013-03-19 NOTE — Progress Notes (Signed)
PROGRESS NOTE  Michelle Lopez [id number] DOB: [birth year] DOA: 03/18/2013 PCP: Scarlette Calico, MD  Assessment/Plan: Dizziness - prior history of strokes and presenting with severe dizziness. Obtain MRI.  Shortness of breath - check 2D echo, troponin x 3.  Prior CVA - continue home medications DM - SSI, hold metformin.   Code Status: Full Family Communication: none  Disposition Plan: inpatient  Consultants:  none  Procedures:  2D echo   Antibiotics  Anti-infectives   None     Antibiotics Given (last 72 hours)   None      HPI/Subjective: - no complaints  Objective: Filed Vitals:   03/19/13 0552 03/19/13 0917 03/19/13 1336 03/19/13 1453  BP: 120/53  127/63   Pulse: 76  82   Temp: 98.1 F (36.7 C)  98.1 F (36.7 C)   TempSrc: Oral  Oral   Resp: 20  18   Height:      Weight:      SpO2: 94% 95% 96% 95%    Intake/Output Summary (Last 24 hours) at 03/19/13 1755 Last data filed at 03/19/13 1300  Gross per 24 hour  Intake    600 ml  Output      0 ml  Net    600 ml   Filed Weights   03/19/13 0100 03/19/13 0200 03/19/13 0300  Weight: 59.6 kg (131 lb 6.3 oz) 59.6 kg (131 lb 6.3 oz) 59.6 kg (131 lb 6.3 oz)    Exam:   General:  NAD  Cardiovascular: regular rate and rhythm, without MRG  Respiratory: good air movement, clear to auscultation throughout, no wheezing, ronchi or rales  Abdomen: soft, not tender to palpation, positive bowel sounds  MSK: no peripheral edema  Neuro: 5-/5 left upper and lower extremity  Data Reviewed: Basic Metabolic Panel:  Recent Labs Lab 03/18/13 2143 03/19/13 0240  NA 136*  --   K 4.5  --   CL 98  --   CO2 25  --   GLUCOSE 139*  --   BUN 25*  --   CREATININE 1.21* 1.23*  CALCIUM 9.3  --    CBC:  Recent Labs Lab 03/18/13 2143 03/19/13 0240  WBC 8.7 8.5  NEUTROABS 5.6  --   HGB 13.3 12.3  HCT 41.1 38.5  MCV 82.4 81.9  PLT 262 246   Cardiac Enzymes:  Recent Labs Lab 03/19/13 0240 03/19/13 0811  03/19/13 1410  TROPONINI <0.30 <0.30 <0.30   BNP (last 3 results) No results found for this basename: PROBNP,  in the last 8760 hours CBG:  Recent Labs Lab 03/18/13 1942  GLUCAP 140*    No results found for this or any previous visit (from the past 240 hour(s)).   Studies: Dg Chest 2 View  03/18/2013   CLINICAL DATA:  Short of breath and weakness.  EXAM: CHEST  2 VIEW  COMPARISON:  DG CHEST 2 VIEW dated 04/21/2011; DG CHEST 1 VIEW dated 04/20/2011; DG CHEST 2 VIEW dated 12/14/2009; CT ANGIO CHEST W/CM &/OR WO/CM dated 12/14/2009  FINDINGS: Cardiopericardial silhouette within normal limits. Monitoring leads project over the chest. No airspace disease. Chronic scarring is present at the left lung base. Scattered bilateral calcified granulomata.  IMPRESSION: No acute abnormality. Chronic scarring at the left lung base and old granulomatous disease.   Electronically Signed   By: Dereck Ligas M.D.   On: 03/18/2013 22:37   Ct Head Wo Contrast  03/18/2013   CLINICAL DATA:  Vertigo.  EXAM: CT HEAD WITHOUT CONTRAST  TECHNIQUE: Contiguous axial images were obtained from the base of the skull through the vertex without intravenous contrast.  COMPARISON:  CT HEAD W/O CM dated 04/20/2011  FINDINGS: No mass lesion, mass effect, midline shift, hydrocephalus, hemorrhage. No acute territorial cortical ischemia/infarct. Atrophy and chronic ischemic white matter disease is present. Posterior fossa structures appears similar to prior with a small left cerebellar hemisphere lacunar infarct. No posterior fossa mass lesions near the costophrenic angle identified on noncontrast CT. Intracranial atherosclerosis is present. The paranasal sinuses appear within normal limits. Mastoid air cells show either tiny amount of fluid or partial volume averaging on the left.  IMPRESSION: 1. No acute intracranial abnormality. 2. Atrophy and chronic ischemic white matter disease. Atrophy is age appropriate. 3. 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   Electronically Signed   By: Dereck Ligas M.D.   On: 03/18/2013 21:22   Mr Michelle Lopez Contrast  03/19/2013   CLINICAL DATA:  68 year old female with dizziness, blurred vision. Initial encounter. TIA. History of hypertension, diabetes, stroke.  EXAM: MRI HEAD WITHOUT AND WITH CONTRAST  TECHNIQUE: Multiplanar, multiecho pulse sequences of the brain and surrounding structures were obtained without and with intravenous contrast.  CONTRAST:  15 mL MultiHance.  COMPARISON:  Head CT without contrast 03/18/2013 and earlier. Brain MRI 09/23/2008.  FINDINGS: No restricted diffusion or evidence of acute infarction. Interval loss of the distal left vertebral artery flow void (series 6, image 4 today versus series 6, image 3 previously). Other Major intracranial vascular flow voids are stable.  Advanced chronic white matter signal changes in the brain, patchy and confluent in the cerebral hemispheres and mildly progressed since 2010. Severe interval brainstem ischemia with multifocal chronic pontine lacunar infarcts, progressed multifocal chronic left cerebellar lacunar infarcts, and interval left cerebellar atrophy with some gliosis. No supratentorial cortical encephalomalacia identified. Right cerebellar hemisphere is stable and within normal limits.  No midline shift, mass effect, or evidence of intracranial mass lesion. No acute intracranial hemorrhage identified. No ventriculomegaly. Negative pituitary. Negative for age cervicomedullary junction and visualized cervical spine. No abnormal enhancement identified.  Interval postoperative changes to both globes. Mild bilateral mastoid effusions not significantly changed. Negative visualized cervical spine. Paranasal sinuses remain clear. Stable bone marrow signal, within normal limits. Visualized scalp soft tissues are within normal limits.  IMPRESSION: 1.  No acute intracranial abnormality. 2. 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 Subsequently, with left cerebellar encephalomalacia. 3. Superimposed chronically advanced supratentorial white matter signal changes, nonspecific but favor also chronic small vessel disease related.   Electronically Signed   By: Lars Pinks M.D.   On: 03/19/2013 17:51    Scheduled Meds: . albuterol  2.5 mg Nebulization Q6H  . aspirin EC  81 mg Oral Daily  . atorvastatin  20 mg Oral q1800  . Canagliflozin  1 tablet Oral Daily  . clopidogrel  75 mg Oral Daily  . heparin  5,000 Units Subcutaneous 3 times per day  . hydrochlorothiazide  12.5 mg Oral Daily  . hydrocortisone cream  1 application Topical BID  . linagliptin  5 mg Oral Daily  . losartan  100 mg Oral Daily  . metFORMIN  1,000 mg Oral BID WC  . sodium chloride  3 mL Intravenous Q12H  . sodium chloride  3 mL Intravenous Q12H  . tamsulosin  0.4 mg Oral  Daily   Continuous Infusions:   Principal Problem:   Equivalent angina Active Problems:   Dyspnea   Time spent: [place], MD [facility] Pager [phone]. If 7 PM - 7 AM, please contact night-coverage at [email or web address], password [facility] 03/19/2013, 5:55 PM  LOS: 1 day

## 2013-03-19 NOTE — Progress Notes (Signed)
UR completed 

## 2013-03-19 NOTE — Progress Notes (Signed)
  Echocardiogram 2D Echocardiogram has been performed.  Diamond Nickel 03/19/2013, 2:27 PM

## 2013-03-19 NOTE — Progress Notes (Signed)
VASCULAR LAB PRELIMINARY  PRELIMINARY  PRELIMINARY  PRELIMINARY  Carotid duplex completed.    Preliminary report:  Bilateral:  1-39% ICA stenosis.  Vertebral artery flow is antegrade.     Kalena Mander, RVS 03/19/2013, 12:16 PM

## 2013-03-19 NOTE — ED Notes (Signed)
Attempted to call report, CN will call back

## 2013-03-20 DIAGNOSIS — I209 Angina pectoris, unspecified: Secondary | ICD-10-CM | POA: Diagnosis not present

## 2013-03-20 DIAGNOSIS — H814 Vertigo of central origin: Secondary | ICD-10-CM

## 2013-03-20 DIAGNOSIS — R0609 Other forms of dyspnea: Secondary | ICD-10-CM | POA: Diagnosis not present

## 2013-03-20 LAB — GLUCOSE, CAPILLARY
GLUCOSE-CAPILLARY: 132 mg/dL — AB (ref 70–99)
Glucose-Capillary: 121 mg/dL — ABNORMAL HIGH (ref 70–99)
Glucose-Capillary: 204 mg/dL — ABNORMAL HIGH (ref 70–99)
Glucose-Capillary: 156 mg/dL — ABNORMAL HIGH (ref 70–99)

## 2013-03-20 MED ORDER — ALBUTEROL SULFATE (2.5 MG/3ML) 0.083% IN NEBU: 2.5000 mg | INHALATION_SOLUTION | Freq: Four times a day (QID) | RESPIRATORY_TRACT | Status: DC | PRN

## 2013-03-20 MED ORDER — METOPROLOL TARTRATE 12.5 MG HALF TABLET
12.5000 mg | ORAL_TABLET | Freq: Two times a day (BID) | ORAL | Status: DC
  Administered 2013-03-20 – 2013-03-21 (×2): 12.5 mg via ORAL
  Filled 2013-03-20 (×3): qty 1

## 2013-03-20 NOTE — Progress Notes (Signed)
PROGRESS NOTE  Michelle Lopez [id number] DOB: [birth year] DOA: 03/18/2013 PCP: Scarlette Calico, MD  Assessment/Plan: Dizziness - prior history of strokes and presenting with severe dizziness.  - MRI as below, neurology consulted.  Shortness of breath  - 2D echo as below, case discussed with cardiology over her findings. Start low dose BB.  Prior CVA - continue home medications DM - SSI, hold metformin.   Code Status: Full Family Communication: none  Disposition Plan: inpatient  Consultants:  none  Procedures:  2D echo Study Conclusions  - Left ventricle: The cavity size was normal. There was moderate focal basal hypertrophy. Systolic function was vigorous. The estimated ejection fraction was in the range of 65% to 70%. There was dynamic obstruction at rest, with mid-cavity obliteration, a peak velocity of 186cm/sec, and a peak gradient of 51mm Hg. Wall motion was normal; there were no regional wall motion abnormalities.    Antibiotics  Anti-infectives   None     Antibiotics Given (last 72 hours)   None      HPI/Subjective: - no complaints  Objective: Filed Vitals:   03/19/13 1453 03/19/13 2243 03/20/13 0431 03/20/13 1316  BP:  136/80 107/55 120/59  Pulse:  73 69 82  Temp:  98.4 F (36.9 C) 97.7 F (36.5 C) 98.2 F (36.8 C)  TempSrc:  Oral Oral Oral  Resp:  18 18 18   Height:      Weight:      SpO2: 95% 98% 98% 98%    Intake/Output Summary (Last 24 hours) at 03/20/13 1838 Last data filed at 03/20/13 1807  Gross per 24 hour  Intake    600 ml  Output      0 ml  Net    600 ml   Filed Weights   03/19/13 0100 03/19/13 0200 03/19/13 0300  Weight: 59.6 kg (131 lb 6.3 oz) 59.6 kg (131 lb 6.3 oz) 59.6 kg (131 lb 6.3 oz)    Exam:   General:  NAD  Cardiovascular: regular rate and rhythm, without MRG  Respiratory: good air movement, clear to auscultation throughout, no wheezing, ronchi or rales  Abdomen: soft, not tender to palpation, positive bowel  sounds  MSK: no peripheral edema  Neuro: 5-/5 left upper and lower extremity  Data Reviewed: Basic Metabolic Panel:  Recent Labs Lab 03/18/13 2143 03/19/13 0240  NA 136*  --   K 4.5  --   CL 98  --   CO2 25  --   GLUCOSE 139*  --   BUN 25*  --   CREATININE 1.21* 1.23*  CALCIUM 9.3  --    CBC:  Recent Labs Lab 03/18/13 2143 03/19/13 0240  WBC 8.7 8.5  NEUTROABS 5.6  --   HGB 13.3 12.3  HCT 41.1 38.5  MCV 82.4 81.9  PLT 262 246   Cardiac Enzymes:  Recent Labs Lab 03/19/13 0240 03/19/13 0811 03/19/13 1410  TROPONINI <0.30 <0.30 <0.30   BNP (last 3 results) No results found for this basename: PROBNP,  in the last 8760 hours CBG:  Recent Labs Lab 03/18/13 1942 03/19/13 2241 03/20/13 0727 03/20/13 1116 03/20/13 1724  GLUCAP 140* 121* 121* 204* 156*    No results found for this or any previous visit (from the past 240 hour(s)).   Studies: Dg Chest 2 View  03/18/2013   CLINICAL DATA:  Short of breath and weakness.  EXAM: CHEST  2 VIEW  COMPARISON:  DG CHEST 2 VIEW dated 04/21/2011; DG CHEST 1 VIEW dated  04/20/2011; DG CHEST 2 VIEW dated 12/14/2009; CT ANGIO CHEST W/CM &/OR WO/CM dated 12/14/2009  FINDINGS: Cardiopericardial silhouette within normal limits. Monitoring leads project over the chest. No airspace disease. Chronic scarring is present at the left lung base. Scattered bilateral calcified granulomata.  IMPRESSION: No acute abnormality. Chronic scarring at the left lung base and old granulomatous disease.   Electronically Signed   By: Dereck Ligas M.D.   On: 03/18/2013 22:37   Ct Head Wo Contrast  03/18/2013   CLINICAL DATA:  Vertigo.  EXAM: CT HEAD WITHOUT CONTRAST  TECHNIQUE: Contiguous axial images were obtained from the base of the skull through the vertex without intravenous contrast.  COMPARISON:  CT HEAD W/O CM dated 04/20/2011  FINDINGS: No mass lesion, mass effect, midline shift, hydrocephalus, hemorrhage. No acute territorial cortical  ischemia/infarct. Atrophy and chronic ischemic white matter disease is present. Posterior fossa structures appears similar to prior with a small left cerebellar hemisphere lacunar infarct. No posterior fossa mass lesions near the costophrenic angle identified on noncontrast CT. Intracranial atherosclerosis is present. The paranasal sinuses appear within normal limits. Mastoid air cells show either tiny amount of fluid or partial volume averaging on the left.  IMPRESSION: 1. No acute intracranial abnormality. 2. Atrophy and chronic ischemic white matter disease. Atrophy is age appropriate. 3. 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   Electronically Signed   By: Dereck Ligas M.D.   On: 03/18/2013 21:22   [name] Contrast  03/19/2013   CLINICAL DATA:  68 year old female with dizziness, blurred vision. Initial encounter. TIA. History of hypertension, diabetes, stroke.  EXAM: MRI HEAD WITHOUT AND WITH CONTRAST  TECHNIQUE: Multiplanar, multiecho pulse sequences of the brain and surrounding structures were obtained without and with intravenous contrast.  CONTRAST:  15 mL MultiHance.  COMPARISON:  Head CT without contrast 03/18/2013 and earlier. Brain MRI 09/23/2008.  FINDINGS: No restricted diffusion or evidence of acute infarction. Interval loss of the distal left vertebral artery flow void (series 6, image 4 today versus series 6, image 3 previously). Other Major intracranial vascular flow voids are stable.  Advanced chronic white matter signal changes in the brain, patchy and confluent in the cerebral hemispheres and mildly progressed since 2010. Severe interval brainstem ischemia with multifocal chronic pontine lacunar infarcts, progressed multifocal chronic left cerebellar lacunar infarcts, and interval left cerebellar atrophy with some gliosis. No supratentorial cortical encephalomalacia identified. Right cerebellar hemisphere is stable and within normal limits.  No midline shift, mass effect, or evidence of  intracranial mass lesion. No acute intracranial hemorrhage identified. No ventriculomegaly. Negative pituitary. Negative for age cervicomedullary junction and visualized cervical spine. No abnormal enhancement identified.  Interval postoperative changes to both globes. Mild bilateral mastoid effusions not significantly changed. Negative visualized cervical spine. Paranasal sinuses remain clear. Stable bone marrow signal, within normal limits. Visualized scalp soft tissues are within normal limits.  IMPRESSION: 1.  No acute intracranial abnormality. 2. 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 Subsequently, with left cerebellar encephalomalacia. 3. Superimposed chronically advanced supratentorial white matter signal changes, nonspecific but favor also chronic small vessel disease related.   Electronically Signed   By: Lars Pinks M.D.   On: 03/19/2013 17:51    Scheduled Meds: . aspirin EC  81 mg Oral Daily  . atorvastatin  20 mg Oral q1800  . Canagliflozin  1  tablet Oral Daily  . clopidogrel  75 mg Oral Daily  . heparin  5,000 Units Subcutaneous 3 times per day  . hydrochlorothiazide  12.5 mg Oral Daily  . hydrocortisone cream  1 application Topical BID  . insulin aspart  0-9 Units Subcutaneous TID WC  . linagliptin  5 mg Oral Daily  . losartan  100 mg Oral Daily  . sodium chloride  3 mL Intravenous Q12H  . sodium chloride  3 mL Intravenous Q12H  . tamsulosin  0.4 mg Oral Daily   Continuous Infusions:   Principal Problem:   Equivalent angina Active Problems:   Dyspnea   Time spent: [place], MD [facility] Pager [phone]. If 7 PM - 7 AM, please contact night-coverage at [email or web address], password [facility] 03/20/2013, 6:38 PM  LOS: 2 days

## 2013-03-20 NOTE — Consult Note (Signed)
NEURO HOSPITALIST CONSULT NOTE    Reason for Consult: Chronic dizziness over one year.   HPI:                                                                                                                                          Michelle Lopez is an 68 y.o. female who states se has had multiple stroke (first stroke 2010).  He strokes have affected her left arm and leg, balance and speech in the past.  She states over the past year she has had a progressive worsening of dizziness.  She describes it as a feeling of wooziness that occurs when sitting up or standing but not when laying down.  The woozy feeling does not improve after a duration of time but remains constant until she lays back down.  She denies any true Vertigo, tinnitus, rushing sounds in her ear. She denies worsening of symptoms when she moves her head or body.  She denies any N/V with symptoms.  She feels her vision has slowly worsened as well.    She also has noted her voice has progressively become more slurred over a period of one year. She admits to having difficulty swallowing both liquids and solids --"I have to be careful so I do not choke".  The dysphagia is constant and does not worsen with chewing or become worse later in the day.   She was brought to hospital on this visit due to standing up cooking a meal and feeling a sudden light headed sensation needing her to sit down.  Sitting down improved the symptoms but daughter wanted her to be seen in ED.   Past Medical History  Diagnosis Date  . Hyperlipidemia   . Diabetes mellitus     type 2, uncontrolled  . Essential hypertension, benign   . Cocaine abuse, unspecified   . Diarrhea   . Cerebrovascular disease, unspecified   . Stroke     x7    Past Surgical History  Procedure Laterality Date  . Total hip arthroplasty  1970    Dr. Rodell Perna    Family History  Problem Relation Age of Onset  . Colon cancer Neg Hx     Social History:   reports that she has never smoked. She has never used smokeless tobacco. She reports that she does not drink alcohol or use illicit drugs.  Allergies  Allergen Reactions  . Crestor [Rosuvastatin Calcium]     myalgias  . Morphine Hives  . Pravastatin Sodium Rash  . Simvastatin Rash    MEDICATIONS:  Prior to Admission:  Prescriptions prior to admission  Medication Sig Dispense Refill  . aspirin 81 MG tablet Take 81 mg by mouth daily.        Marland Kitchen atorvastatin (LIPITOR) 20 MG tablet Take 1 tablet (20 mg total) by mouth daily.  90 tablet  3  . Canagliflozin (INVOKANA) 300 MG TABS Take 1 tablet (300 mg total) by mouth daily.  90 tablet  3  . clopidogrel (PLAVIX) 75 MG tablet Take 75 mg by mouth daily.      Marland Kitchen desonide (DESOWEN) 0.05 % lotion Apply topically 2 (two) times daily.  59 mL  2  . hydrochlorothiazide (MICROZIDE) 12.5 MG capsule Take 12.5 mg by mouth daily.      Marland Kitchen ketoconazole (NIZORAL) 2 % cream Apply 1 application topically 2 (two) times daily.  60 g  2  . metFORMIN (GLUCOPHAGE) 1000 MG tablet Take 1,000 mg by mouth 2 (two) times daily with a meal.      . PRODIGY NO CODING BLOOD GLUC test strip USE TO CHECK BLOOD SUGAR TWICE DAILY  100 each  PRN  . PRODIGY TWIST TOP LANCETS 28G MISC USE TO CHECK BLOOD SUGAR TWICE DAILY  100 each  PRN  . tamsulosin (FLOMAX) 0.4 MG CAPS capsule Take 0.4 mg by mouth daily.      Marland Kitchen linagliptin (TRADJENTA) 5 MG TABS tablet Take 1 tablet (5 mg total) by mouth daily.  90 tablet  3  . losartan (COZAAR) 100 MG tablet Take 1 tablet (100 mg total) by mouth daily.  90 tablet  3   Scheduled: . aspirin EC  81 mg Oral Daily  . atorvastatin  20 mg Oral q1800  . Canagliflozin  1 tablet Oral Daily  . clopidogrel  75 mg Oral Daily  . heparin  5,000 Units Subcutaneous 3 times per day  . hydrochlorothiazide  12.5 mg Oral Daily  . hydrocortisone cream  1  application Topical BID  . insulin aspart  0-9 Units Subcutaneous TID WC  . linagliptin  5 mg Oral Daily  . losartan  100 mg Oral Daily  . sodium chloride  3 mL Intravenous Q12H  . sodium chloride  3 mL Intravenous Q12H  . tamsulosin  0.4 mg Oral Daily     ROS:                                                                                                                                       History obtained from the patient  General ROS: negative for - chills, fatigue, fever, night sweats, weight gain or weight loss Psychological ROS: negative for - behavioral disorder, hallucinations, memory difficulties, mood swings or suicidal ideation Ophthalmic ROS: negative for - blurry vision, double vision, eye pain or loss of vision ENT ROS: negative for - epistaxis, nasal discharge, oral lesions, sore throat, tinnitus or vertigo Allergy and Immunology ROS: negative for - hives or  itchy/watery eyes Hematological and Lymphatic ROS: negative for - bleeding problems, bruising or swollen lymph nodes Endocrine ROS: negative for - galactorrhea, hair pattern changes, polydipsia/polyuria or temperature intolerance Respiratory ROS: negative for - cough, hemoptysis, shortness of breath or wheezing Cardiovascular ROS: negative for - chest pain, dyspnea on exertion, edema or irregular heartbeat Gastrointestinal ROS: negative for - abdominal pain, diarrhea, hematemesis, nausea/vomiting or stool incontinence Genito-Urinary ROS: negative for - dysuria, hematuria, incontinence or urinary frequency/urgency Musculoskeletal ROS: negative for - joint swelling or muscular weakness Neurological ROS: as noted in HPI Dermatological ROS: negative for rash and skin lesion changes   Blood pressure 120/59, pulse 82, temperature 98.2 F (36.8 C), temperature source Oral, resp. rate 18, height 5' (1.524 m), weight 59.6 kg (131 lb 6.3 oz), SpO2 98.00%.   Neurologic Examination:                                                                                                       Mental Status: Alert, oriented, thought content appropriate.  Speech dysarthric without evidence of aphasia.  Able to follow 3 step commands without difficulty. Cranial Nerves: II: Discs flat bilaterally; Visual fields grossly normal, pupils equal, round, reactive to light and accommodation III,IV, VI: ptosis not present, extra-ocular motions intact bilaterally V,VII: smile asymmetric on the right (old), facial light touch sensation normal bilaterally VIII: hearing normal bilaterally IX,X: gag reflex present XI: bilateral shoulder shrug XII: midline tongue extension without atrophy or fasciculations  Motor: Right : Upper extremity   5/5    Left:     Upper extremity   5/5  Lower extremity   5/5     Lower extremity   5/5 Tone and bulk:normal tone throughout; no atrophy noted Sensory: Pinprick and light touch intact throughout, bilaterally Deep Tendon Reflexes:  Right: Upper Extremity   Left: Upper extremity   biceps (C-5 to C-6) 2/4   biceps (C-5 to C-6) 2/4 tricep (C7) 2/4    triceps (C7) 2/4 Brachioradialis (C6) 2/4  Brachioradialis (C6) 2/4  Lower Extremity Lower Extremity  quadriceps (L-2 to L-4) 2/4   quadriceps (L-2 to L-4) 2/4 Achilles (S1) 2/4   Achilles (S1) 2/4  Plantars: Right: downgoing   Left: downgoing Cerebellar: normal finger-to-nose,  Dysmetric  heel-to-shin bilaterally Gait: narrow based, negative rhomberg, unable to walk in tandem gait--falls to both sides.  CV: pulses palpable throughout    Lab Results: Basic Metabolic Panel:  Recent Labs Lab 03/18/13 2143 03/19/13 0240  NA 136*  --   K 4.5  --   CL 98  --   CO2 25  --   GLUCOSE 139*  --   BUN 25*  --   CREATININE 1.21* 1.23*  CALCIUM 9.3  --     Liver Function Tests: No results found for this basename: AST, ALT, ALKPHOS, BILITOT, PROT, ALBUMIN,  in the last 168 hours No results found for this basename: LIPASE, AMYLASE,  in the last 168  hours No results found for this basename: AMMONIA,  in the last 168 hours  CBC:  Recent Labs Lab  03/18/13 2143 03/19/13 0240  WBC 8.7 8.5  NEUTROABS 5.6  --   HGB 13.3 12.3  HCT 41.1 38.5  MCV 82.4 81.9  PLT 262 246    Cardiac Enzymes:  Recent Labs Lab 03/19/13 0240 03/19/13 0811 03/19/13 1410  TROPONINI <0.30 <0.30 <0.30    Lipid Panel: No results found for this basename: CHOL, TRIG, HDL, CHOLHDL, VLDL, LDLCALC,  in the last 168 hours  CBG:  Recent Labs Lab 03/18/13 1942 03/19/13 2241 03/20/13 0727 03/20/13 1116  GLUCAP 140* 121* 121* 204*    Microbiology: Results for orders placed in visit on 05/02/11  CULTURE, URINE COMPREHENSIVE     Status: None   Collection Time    05/02/11 11:30 AM      Result Value Ref Range Status   Culture STAPHYLOCOCCUS SPECIES (COAGULASE NEGATIVE)   Final   Colony Count 75,000 COLONIES/ML   Final   Organism ID, Bacteria STAPHYLOCOCCUS SPECIES (COAGULASE NEGATIVE)   Final   Comment: Rifampin and Gentamicin should not be used as     single drugs for treatment of Staph infections.    Coagulation Studies: No results found for this basename: LABPROT, INR,  in the last 72 hours  Imaging: Dg Chest 2 View  03/18/2013   CLINICAL DATA:  Short of breath and weakness.  EXAM: CHEST  2 VIEW  COMPARISON:  DG CHEST 2 VIEW dated 04/21/2011; DG CHEST 1 VIEW dated 04/20/2011; DG CHEST 2 VIEW dated 12/14/2009; CT ANGIO CHEST W/CM &/OR WO/CM dated 12/14/2009  FINDINGS: Cardiopericardial silhouette within normal limits. Monitoring leads project over the chest. No airspace disease. Chronic scarring is present at the left lung base. Scattered bilateral calcified granulomata.  IMPRESSION: No acute abnormality. Chronic scarring at the left lung base and old granulomatous disease.   Electronically Signed   By: Dereck Ligas M.D.   On: 03/18/2013 22:37   Ct Head Wo Contrast  03/18/2013   CLINICAL DATA:  Vertigo.  EXAM: CT HEAD WITHOUT CONTRAST  TECHNIQUE:  Contiguous axial images were obtained from the base of the skull through the vertex without intravenous contrast.  COMPARISON:  CT HEAD W/O CM dated 04/20/2011  FINDINGS: No mass lesion, mass effect, midline shift, hydrocephalus, hemorrhage. No acute territorial cortical ischemia/infarct. Atrophy and chronic ischemic white matter disease is present. Posterior fossa structures appears similar to prior with a small left cerebellar hemisphere lacunar infarct. No posterior fossa mass lesions near the costophrenic angle identified on noncontrast CT. Intracranial atherosclerosis is present. The paranasal sinuses appear within normal limits. Mastoid air cells show either tiny amount of fluid or partial volume averaging on the left.  IMPRESSION: 1. No acute intracranial abnormality. 2. Atrophy and chronic ischemic white matter disease. Atrophy is age appropriate. 3. 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   Electronically Signed   By: Dereck Ligas M.D.   On: 03/18/2013 21:22   [name] Contrast  03/19/2013   CLINICAL DATA:  68 year old female with dizziness, blurred vision. Initial encounter. TIA. History of hypertension, diabetes, stroke.  EXAM: MRI HEAD WITHOUT AND WITH CONTRAST  TECHNIQUE: Multiplanar, multiecho pulse sequences of the brain and surrounding structures were obtained without and with intravenous contrast.  CONTRAST:  15 mL MultiHance.  COMPARISON:  Head CT without contrast 03/18/2013 and earlier. Brain MRI 09/23/2008.  FINDINGS: No restricted diffusion or evidence of acute infarction. Interval loss of the distal left vertebral artery flow void (series 6, image 4 today versus series 6, image 3 previously). Other Major intracranial vascular  flow voids are stable.  Advanced chronic white matter signal changes in the brain, patchy and confluent in the cerebral hemispheres and mildly progressed since 2010. Severe interval brainstem ischemia with multifocal chronic pontine lacunar infarcts, progressed  multifocal chronic left cerebellar lacunar infarcts, and interval left cerebellar atrophy with some gliosis. No supratentorial cortical encephalomalacia identified. Right cerebellar hemisphere is stable and within normal limits.  No midline shift, mass effect, or evidence of intracranial mass lesion. No acute intracranial hemorrhage identified. No ventriculomegaly. Negative pituitary. Negative for age cervicomedullary junction and visualized cervical spine. No abnormal enhancement identified.  Interval postoperative changes to both globes. Mild bilateral mastoid effusions not significantly changed. Negative visualized cervical spine. Paranasal sinuses remain clear. Stable bone marrow signal, within normal limits. Visualized scalp soft tissues are within normal limits.  IMPRESSION: 1.  No acute intracranial abnormality. 2. 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 Subsequently, with left cerebellar encephalomalacia. 3. Superimposed chronically advanced supratentorial white matter signal changes, nonspecific but favor also chronic small vessel disease related.   Electronically Signed   By: Lars Pinks M.D.   On: 03/19/2013 17:51    Etta Quill PA-C [facility] [phone]  03/20/2013, 2:52 PM  Patient seen and examined.  Clinical course and management discussed.  Necessary edits performed.  I agree with the above.  Assessment and plan of care developed and discussed below.    Assessment/Plan: 68 year old female presenting with vertigo.  MRI has been reviewed and shows no acute changes.  It does show chronic posterior circulation ischemic events.  MRA shows a chronic vertebral occlusion.  Since the patient's dizziness is not acute in onset and her imaging pathology is not acute, it is likely that her posterior circulation pathology is  the etiology of her dizziness.  She is on Plavix and ASA.  Echo and carotid doppler have been performed and both are unremarkable.  A1c elevated at 7.7.  LDL elevated at 114.    Recommendations: 1.  Since her vertebral artery is occluded medical therapy is indicated.  Would continue ASA and Plavix. 2.  Lipid and glucose management 3.  Vestibular therapy 4.  No further neurologic intervention is recommended at this time.  If further questions arise, please call or page at that time.  Thank you for allowing neurology to participate in the care of this patient.   Alexis Goodell, MD [facility] [phone]  03/20/2013  5:19 PM

## 2013-03-21 DIAGNOSIS — R0989 Other specified symptoms and signs involving the circulatory and respiratory systems: Secondary | ICD-10-CM | POA: Diagnosis not present

## 2013-03-21 DIAGNOSIS — R42 Dizziness and giddiness: Secondary | ICD-10-CM | POA: Diagnosis present

## 2013-03-21 DIAGNOSIS — I6502 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: Secondary | ICD-10-CM | POA: Diagnosis present

## 2013-03-21 DIAGNOSIS — R0609 Other forms of dyspnea: Secondary | ICD-10-CM | POA: Diagnosis not present

## 2013-03-21 DIAGNOSIS — I209 Angina pectoris, unspecified: Secondary | ICD-10-CM | POA: Diagnosis not present

## 2013-03-21 LAB — MAGNESIUM: Magnesium: 1.7 mg/dL (ref 1.5–2.5)

## 2013-03-21 LAB — GLUCOSE, CAPILLARY: Glucose-Capillary: 170 mg/dL — ABNORMAL HIGH (ref 70–99)

## 2013-03-21 MED ORDER — MAGNESIUM SULFATE 40 MG/ML IJ SOLN
2.0000 g | Freq: Once | INTRAMUSCULAR | Status: AC
  Administered 2013-03-21: 2 g via INTRAVENOUS
  Filled 2013-03-21: qty 50

## 2013-03-21 MED ORDER — METOPROLOL TARTRATE 12.5 MG HALF TABLET: 12.5000 mg | ORAL_TABLET | Freq: Two times a day (BID) | ORAL | Status: DC

## 2013-03-21 NOTE — Progress Notes (Signed)
Pt had 7 beats of V.Tach around [id number].  Pt in NAD, no complaints of pain.  VSS, BP  122/66, HR 67.  Notified NP on call, K. Schorr and received orders for Magnesium level.  Will continue to monitor pt throughout the night.

## 2013-03-21 NOTE — Evaluation (Signed)
Physical Therapy Evaluation Patient Details Name: Michelle Lopez MRN: [id number] DOB: 1945-02-27 Today's Date: 03/21/2013 Time: 1012-1025 PT Time Calculation (min): 13 min  PT Assessment / Plan / Recommendation History of Present Illness  68 y.o. female who states se has had multiple stroke (first stroke 2010).  He strokes have affected her left arm and leg, balance and speech in the past.  She states over the past year she has had a progressive worsening of dizziness. She describes it as a feeling of wooziness that occurs when sitting up or standing but not when laying down.  The woozy feeling does not improve after a duration of time but remains constant until she lays back down.  She denies any true Vertigo, tinnitus, rushing sounds in her ear. She denies worsening of symptoms when she moves her head or body.  She denies any N/V with symptoms.  She feels her vision has slowly worsened as well.    Clinical Impression  Pt admitted with above. Pt currently with functional limitations due to the deficits listed below (see PT Problem List).  Pt will benefit from skilled PT to increase their independence and safety with mobility to allow discharge to the venue listed below.  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  Pt reports "wooziness" for past year without relief, states usually a little worse with mobility.  Pt states wooziness feeling never leaves and nothing eases.  Pt reports no increase in wooziness or any other symptoms with eye movement, saccades, head shaking, VOR, head thrust and no signs observed either.  Recommended pt use RW instead of cane at this time due to poor balance and to increase safety.  Pt agreeable to HHPT. Pt  reports flight of stairs into apt so also recommend assist home for safety.  Pt states she doesn't leave apt without another person (usually her daughter) present.      PT Assessment  Patient needs continued PT services    Follow Up Recommendations  Home health PT    Does the patient have the potential to tolerate intense rehabilitation      Barriers to Discharge        Equipment Recommendations  None recommended by PT    Recommendations for Other Services     Frequency Min 3X/week    Precautions / Restrictions Precautions Precautions: Fall Restrictions Weight Bearing Restrictions: No   Pertinent Vitals/Pain No pain     Mobility  Bed Mobility Overal bed mobility: Modified Independent Transfers Overall transfer level: Needs assistance Equipment used: 1 person hand held assist Transfers: Sit to/from Stand Sit to Stand: Min guard Ambulation/Gait Ambulation/Gait assistance: Min assist Ambulation Distance (Feet): 40 Feet (x2) Assistive device: 1 person hand held assist;Rolling walker (2 wheeled) Gait Pattern/deviations: Step-through pattern;Narrow base of support;Scissoring;Decreased stride length Gait velocity: decr General Gait Details: pt ambulated with 1 HHA for 40 feet however very unsteady and pt agreeable to use RW to compare with improvement in gait pattern and steadiness observed, recommended pt use RW however she seems a little reluctant     Exercises     PT Diagnosis: Difficulty walking  PT Problem List: Decreased strength;Decreased balance;Decreased mobility;Decreased knowledge of use of DME PT Treatment Interventions: DME instruction;Gait training;Functional  mobility training;Therapeutic activities;Therapeutic exercise;Patient/family education;Neuromuscular re-education;Balance training;Stair training     PT Goals(Current goals can be found in the care plan section) Acute Rehab PT Goals PT Goal Formulation: With patient Time For Goal Achievement:  04/04/13 Potential to Achieve Goals: Good  Visit Information  Last PT Received On: 03/21/13 Assistance Needed: +1 History of Present Illness: 68 y.o. female who states se has had multiple stroke (first stroke 2010).  He strokes have affected her left arm and leg, balance and speech in the past.  She states over the past year she has had a progressive worsening of dizziness. She describes it as a feeling of wooziness that occurs when sitting up or standing but not when laying down.  The woozy feeling does not improve after a duration of time but remains constant until she lays back down.  She denies any true Vertigo, tinnitus, rushing sounds in her ear. She denies worsening of symptoms when she moves her head or body.  She denies any N/V with symptoms.  She feels her vision has slowly worsened as well.         Prior [place] expects to be discharged to:: Private residence Living Arrangements: Children (daughter) Type of Home: Apartment Home Access: Stairs to enter Technical brewer of Steps: flight Home Layout: One level Home Equipment: Environmental consultant - 2 wheels;Cane - single point Prior Function Level of Independence: Independent with assistive device(s) Comments: Pt reports she is mod I with ADLs and ambulates with cane Communication Communication: No difficulties    Cognition  Cognition Arousal/Alertness: Awake/alert Behavior During Therapy: WFL for tasks assessed/performed Overall Cognitive Status: Within Functional Limits for tasks assessed    Extremity/Trunk Assessment Lower Extremity Assessment Lower Extremity Assessment: Generalized weakness   Balance    End of Session PT - End of Session Activity Tolerance: Patient tolerated treatment well Patient left: in bed;with call bell/phone within reach;with bed alarm set Nurse Communication: Mobility status  GP Functional Assessment Tool Used: clinical judgement Functional Limitation: Mobility:  Walking and moving around Mobility: Walking and Moving Around Current Status [id number]): At least 20 percent but less than 40 percent impaired, limited or restricted Mobility: Walking and Moving Around Goal Status [phone]): At least 1 percent but less than 20 percent impaired, limited or restricted   Asah Lamay,KATHrine E 03/21/2013, 10:50 AM Carmelia Bake, PT, DPT 03/21/2013 Pager: [phone]

## 2013-03-21 NOTE — Discharge Summary (Signed)
Physician Discharge Summary  Michelle Lopez [id number] DOB: Jun 26, 1945 DOA: 03/18/2013  PCP: Michelle Calico, MD  Admit date: 03/18/2013 Discharge date: 03/21/2013  Time spent: 35 minutes  Recommendations for Outpatient Follow-up:  1. 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 female, with past medical history significant for diabetes mellitus, cocaine abuse and hypertension presenting today with few hours history of dyspnea and diaphoresis in addition to dizziness . No nausea vomiting or diarrhea. No frank chest pain. The episode started when the patient was cooking between 5 and 6 PM . No history of leg swelling or PND's. I was called to admit patient for angina equivalent  Hospital Course:  Dizziness - given prior CVAs patient underwent an MRI (full read below) which showed poor flow or occlusion of the distal left vertebral artery which is new. Neurology has been consulted and recommended to continue medical therapy. Patient was monitored on telemetry and there is a reported 7 beat VT however on review it looked like sinus tach. She underwent a 2D echo which showed dynamic obstruction at rest (full read below). This was discussed with Dr. Irish Lack from cardiology who suggested adding a beta blocker.  Prior CVA - neurology consulted as per above.  DM - sliding scale while hospitalized, to resume prior  medications. HBA1C was 7.7 and this can be followed up as an outpatient.   Procedures:  Carotid doppler Bilateral: 1-39% ICA stenosis. Vertebral artery flow is antegrade   2D echo Study Conclusions - Left ventricle: The cavity size was normal. There was moderate focal basal hypertrophy. Systolic function was vigorous. The estimated ejection fraction was in the range of 65% to 70%. There was dynamic obstruction at rest, with mid-cavity obliteration, a peak velocity of 186cm/sec, and a peak gradient of 45mm Hg. Wall motion was normal; there were no regional wall motion abnormalities. - Aortic valve: Trivial regurgitation. - Pulmonary arteries: PA peak pressure: 1mm Hg (S). Impressions:  - The right ventricular systolic pressure was increased consistent with mild pulmonary hypertension.   Consultations:  Neurology  Discharge Exam: Filed Vitals:   03/20/13 2200 03/20/13 2326 03/21/13 0041 03/21/13 0457  BP: 110/52  122/66 125/52  Pulse: 76 80 67 62  Temp: 98.2 F (36.8 C)  97.7 F (36.5 C) 97.6 F (36.4 C)  TempSrc: Oral  Oral Oral  Resp: 17  18 17   Height:      Weight:      SpO2: 95%  99% 95%   General: NAD Cardiovascular: RRR Respiratory: CTA biL  Discharge Instructions   Future Appointments Provider Department Dept Phone   04/04/2013 10:00 AM Michelle Lima, MD [facility] [phone]       Medication List         aspirin 81 MG tablet  Take 81 mg by mouth daily.     atorvastatin 20 MG  tablet  Commonly known as:  LIPITOR  Take 1 tablet (20 mg total) by mouth daily.     Canagliflozin 300 MG Tabs  Commonly known as:  INVOKANA  Take 1 tablet (300 mg total) by mouth daily.     clopidogrel 75 MG tablet  Commonly known as:  PLAVIX  Take 75 mg by mouth daily.     desonide 0.05 % lotion  Commonly known as:  DESOWEN  Apply topically 2 (two) times daily.     hydrochlorothiazide 12.5 MG capsule  Commonly known as:  MICROZIDE  Take 12.5  mg by mouth daily.     ketoconazole 2 % cream  Commonly known as:  NIZORAL  Apply 1 application topically 2 (two) times daily.     linagliptin 5 MG Tabs tablet  Commonly known as:  TRADJENTA  Take 1 tablet (5 mg total) by mouth daily.     losartan 100 MG tablet  Commonly known as:  COZAAR  Take 1 tablet (100 mg total) by mouth daily.     metFORMIN 1000 MG tablet  Commonly known as:  GLUCOPHAGE  Take 1,000 mg by mouth 2 (two) times daily with a meal.     metoprolol tartrate 12.5 mg Tabs tablet  Commonly known as:  LOPRESSOR  Take 0.5 tablets (12.5 mg total) by mouth 2 (two) times daily.     PRODIGY NO CODING BLOOD GLUC test strip  Generic drug:  glucose blood  USE TO CHECK BLOOD SUGAR TWICE DAILY     PRODIGY TWIST TOP LANCETS 28G Misc  USE TO CHECK BLOOD SUGAR TWICE DAILY     tamsulosin 0.4 MG Caps capsule  Commonly known as:  FLOMAX  Take 0.4 mg by mouth daily.       The results of significant diagnostics from this hospitalization (including imaging, microbiology, ancillary and laboratory) are listed below for reference.    Significant Diagnostic Studies: Dg Chest 2 View  03/18/2013   CLINICAL DATA:  Short of breath and weakness.  EXAM: CHEST  2 VIEW  COMPARISON:  DG CHEST 2 VIEW dated 04/21/2011; DG CHEST 1 VIEW dated 04/20/2011; DG CHEST 2 VIEW dated 12/14/2009; CT ANGIO CHEST W/CM &/OR WO/CM dated 12/14/2009  FINDINGS: Cardiopericardial silhouette within normal limits. Monitoring leads project over the chest. No airspace disease. Chronic scarring is present at the left lung base. Scattered bilateral calcified granulomata.  IMPRESSION: No acute abnormality. Chronic scarring at the left lung base and old granulomatous disease.   Electronically Signed   By: Dereck Ligas M.D.   On: 03/18/2013 22:37   Ct Head Wo Contrast  03/18/2013   CLINICAL DATA:  Vertigo.  EXAM: CT HEAD WITHOUT CONTRAST  TECHNIQUE: Contiguous axial images were obtained from the base of the skull through the  vertex without intravenous contrast.  COMPARISON:  CT HEAD W/O CM dated 04/20/2011  FINDINGS: No mass lesion, mass effect, midline shift, hydrocephalus, hemorrhage. No acute territorial cortical ischemia/infarct. Atrophy and chronic ischemic white matter disease is present. Posterior fossa structures appears similar to prior with a small left cerebellar hemisphere lacunar infarct. No posterior fossa mass lesions near the costophrenic angle identified on noncontrast CT. Intracranial atherosclerosis is present. The paranasal sinuses appear within normal limits. Mastoid air cells show either tiny amount of fluid or partial volume averaging on the left.  IMPRESSION: 1. No acute intracranial abnormality. 2. Atrophy and chronic ischemic white matter disease. Atrophy is age appropriate. 3. 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   Electronically Signed  By: Dereck Ligas M.D.   On: 03/18/2013 21:22   [name] Contrast  03/19/2013   CLINICAL DATA:  68 year old female with dizziness, blurred vision. Initial encounter. TIA. History of hypertension, diabetes, stroke.  EXAM: MRI HEAD WITHOUT AND WITH CONTRAST  TECHNIQUE: Multiplanar, multiecho pulse sequences of the brain and surrounding structures were obtained without and with intravenous contrast.  CONTRAST:  15 mL MultiHance.  COMPARISON:  Head CT without contrast 03/18/2013 and earlier. Brain MRI 09/23/2008.  FINDINGS: No restricted diffusion or evidence of acute infarction. Interval loss of the distal left vertebral artery flow void (series 6, image 4 today versus series 6, image 3 previously). Other Major intracranial vascular flow voids are stable.  Advanced chronic white matter signal changes in the brain, patchy and confluent in the cerebral hemispheres and mildly progressed since 2010. Severe interval brainstem ischemia with multifocal chronic pontine lacunar infarcts, progressed multifocal chronic left cerebellar lacunar infarcts, and interval left cerebellar  atrophy with some gliosis. No supratentorial cortical encephalomalacia identified. Right cerebellar hemisphere is stable and within normal limits.  No midline shift, mass effect, or evidence of intracranial mass lesion. No acute intracranial hemorrhage identified. No ventriculomegaly. Negative pituitary. Negative for age cervicomedullary junction and visualized cervical spine. No abnormal enhancement identified.  Interval postoperative changes to both globes. Mild bilateral mastoid effusions not significantly changed. Negative visualized cervical spine. Paranasal sinuses remain clear. Stable bone marrow signal, within normal limits. Visualized scalp soft tissues are within normal limits.  IMPRESSION: 1.  No acute intracranial abnormality. 2. 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 Subsequently, with left cerebellar encephalomalacia. 3. Superimposed chronically advanced supratentorial white matter signal changes, nonspecific but favor also chronic small vessel disease related.   Electronically Signed   By: Lars Pinks M.D.   On: 03/19/2013 17:51   Labs: Basic Metabolic Panel:  Recent Labs Lab 03/18/13 2143 03/19/13 0240 03/21/13 0210  NA 136*  --   --   K 4.5  --   --   CL 98  --   --   CO2 25  --   --   GLUCOSE 139*  --   --   BUN 25*  --   --   CREATININE 1.21* 1.23*  --   CALCIUM 9.3  --   --   MG  --   --  1.7   CBC:  Recent Labs Lab 03/18/13 2143 03/19/13 0240  WBC 8.7 8.5  NEUTROABS 5.6  --   HGB 13.3 12.3  HCT 41.1 38.5  MCV 82.4 81.9  PLT 262 246   Cardiac Enzymes:  Recent Labs Lab 03/19/13 0240 03/19/13 0811 03/19/13 1410  TROPONINI <0.30 <0.30 <0.30   CBG:  Recent Labs Lab 03/20/13 0727 03/20/13 1116 03/20/13 1724 03/20/13 2141 03/21/13 0756  GLUCAP 121* 204* 156* 132* 170*    Signed:  Gisselle Galvis  [facility] 03/21/2013, 1:59 PM

## 2013-03-21 NOTE — Progress Notes (Signed)
Pt selected [facility] for HHPT. Referral given to in house rep with [place].

## 2013-04-02 ENCOUNTER — Other Ambulatory Visit: Payer: Self-pay | Admitting: Internal Medicine

## 2013-04-02 ENCOUNTER — Telehealth: Payer: Self-pay | Admitting: *Deleted

## 2013-04-02 DIAGNOSIS — E1165 Type 2 diabetes mellitus with hyperglycemia: Principal | ICD-10-CM

## 2013-04-02 DIAGNOSIS — E1129 Type 2 diabetes mellitus with other diabetic kidney complication: Secondary | ICD-10-CM

## 2013-04-02 DIAGNOSIS — I10 Essential (primary) hypertension: Secondary | ICD-10-CM

## 2013-04-02 DIAGNOSIS — Z8679 Personal history of other diseases of the circulatory system: Secondary | ICD-10-CM

## 2013-04-02 DIAGNOSIS — IMO0001 Reserved for inherently not codable concepts without codable children: Secondary | ICD-10-CM

## 2013-04-02 DIAGNOSIS — E785 Hyperlipidemia, unspecified: Secondary | ICD-10-CM

## 2013-04-02 MED ORDER — PRODIGY TWIST TOP LANCETS 28G MISC: Status: DC

## 2013-04-02 MED ORDER — TAMSULOSIN HCL 0.4 MG PO CAPS: 0.4000 mg | ORAL_CAPSULE | Freq: Every day | ORAL | Status: DC

## 2013-04-02 MED ORDER — METOPROLOL TARTRATE 12.5 MG HALF TABLET: 12.5000 mg | ORAL_TABLET | Freq: Two times a day (BID) | ORAL | Status: DC

## 2013-04-02 MED ORDER — METFORMIN HCL 1000 MG PO TABS: 1000.0000 mg | ORAL_TABLET | Freq: Two times a day (BID) | ORAL | Status: DC

## 2013-04-02 MED ORDER — CANAGLIFLOZIN 300 MG PO TABS: 1.0000 | ORAL_TABLET | Freq: Every day | ORAL | Status: DC

## 2013-04-02 MED ORDER — HYDROCHLOROTHIAZIDE 12.5 MG PO CAPS: 12.5000 mg | ORAL_CAPSULE | Freq: Every day | ORAL | Status: DC

## 2013-04-02 MED ORDER — ATORVASTATIN CALCIUM 20 MG PO TABS: 20.0000 mg | ORAL_TABLET | Freq: Every day | ORAL | Status: DC

## 2013-04-02 MED ORDER — LOSARTAN POTASSIUM 100 MG PO TABS: 100.0000 mg | ORAL_TABLET | Freq: Every day | ORAL | Status: DC

## 2013-04-02 MED ORDER — GLUCOSE BLOOD VI STRP: ORAL_STRIP | Status: DC

## 2013-04-02 MED ORDER — CLOPIDOGREL BISULFATE 75 MG PO TABS: 75.0000 mg | ORAL_TABLET | Freq: Every day | ORAL | Status: DC

## 2013-04-02 MED ORDER — LINAGLIPTIN 5 MG PO TABS: 5.0000 mg | ORAL_TABLET | Freq: Every day | ORAL | Status: DC

## 2013-04-02 NOTE — Telephone Encounter (Signed)
Daughter, [name], phoned stating that patient had recently been d/c'ed from hospital and her meds had been changed.  Dtr stated pt needed refills on everything, but her otc and her creams to [facility].  Refilled per protocol.

## 2013-04-04 ENCOUNTER — Encounter: Payer: Self-pay | Admitting: Internal Medicine

## 2013-04-04 ENCOUNTER — Ambulatory Visit (INDEPENDENT_AMBULATORY_CARE_PROVIDER_SITE_OTHER): Payer: Medicare Other | Admitting: Internal Medicine

## 2013-04-04 VITALS — BP 100/60 | HR 76 | Temp 97.1°F | Resp 16 | Ht 60.0 in | Wt 132.0 lb

## 2013-04-04 DIAGNOSIS — I10 Essential (primary) hypertension: Secondary | ICD-10-CM

## 2013-04-04 DIAGNOSIS — E1129 Type 2 diabetes mellitus with other diabetic kidney complication: Secondary | ICD-10-CM

## 2013-04-04 DIAGNOSIS — L219 Seborrheic dermatitis, unspecified: Secondary | ICD-10-CM

## 2013-04-04 DIAGNOSIS — E1165 Type 2 diabetes mellitus with hyperglycemia: Secondary | ICD-10-CM | POA: Diagnosis not present

## 2013-04-04 DIAGNOSIS — I209 Angina pectoris, unspecified: Secondary | ICD-10-CM

## 2013-04-04 LAB — GLUCOSE, POCT (MANUAL RESULT ENTRY): POC Glucose: 206 mg/dl — AB (ref 70–99)

## 2013-04-04 MED ORDER — DESONIDE 0.05 % EX LOTN: TOPICAL_LOTION | Freq: Two times a day (BID) | CUTANEOUS | Status: DC

## 2013-04-04 NOTE — Progress Notes (Signed)
Pre visit review using our clinic review tool, if applicable. No additional management support is needed unless otherwise documented below in the visit note. 

## 2013-04-04 NOTE — Assessment & Plan Note (Addendum)
Her BP is low and I think that this is contributing to the dizziness and lightheadedness so I have asked her to stop the HCTZ  She will cont the other meds without any changes

## 2013-04-04 NOTE — Progress Notes (Signed)
   Subjective:    Patient ID: Michelle Lopez, female    DOB: [birth year], 68 y.o.   MRN: [id number]  Hypertension This is a chronic problem. The current episode started more than 1 year ago. The problem is controlled. Pertinent negatives include no anxiety, blurred vision, chest pain, headaches, malaise/fatigue, neck pain, orthopnea, palpitations, peripheral edema, PND, shortness of breath or sweats. There are no associated agents to hypertension. Past treatments include beta blockers, diuretics and angiotensin blockers. The current treatment provides significant improvement. There are no compliance problems.  Hypertensive end-organ damage includes CVA.      Review of Systems  Constitutional: Negative.  Negative for fever, chills, malaise/fatigue, diaphoresis, appetite change and fatigue.  HENT: Negative.   Eyes: Negative.  Negative for blurred vision.  Respiratory: Negative.  Negative for cough, choking, chest tightness, shortness of breath and stridor.   Cardiovascular: Negative.  Negative for chest pain, palpitations, orthopnea, leg swelling and PND.  Gastrointestinal: Negative.  Negative for nausea, vomiting, abdominal pain, diarrhea, constipation and blood in stool.  Endocrine: Negative.   Genitourinary: Negative.   Musculoskeletal: Negative.  Negative for neck pain.  Skin: Negative.   Allergic/Immunologic: Negative.   Neurological: Positive for dizziness and light-headedness. Negative for tremors, facial asymmetry, weakness, numbness and headaches.  Hematological: Negative.  Negative for adenopathy. Does not bruise/bleed easily.  Psychiatric/Behavioral: Negative.        Objective:   Physical Exam  Constitutional: She is oriented to person, place, and time. She appears well-developed and well-nourished. No distress.  HENT:  Head: Normocephalic and atraumatic.  Mouth/Throat: Oropharynx is clear and moist. No oropharyngeal exudate.  Eyes: Conjunctivae are normal. Right eye exhibits  no discharge. Left eye exhibits no discharge. No scleral icterus.  Neck: Normal range of motion. Neck supple. No JVD present. No tracheal deviation present. No thyromegaly present.  Cardiovascular: Normal rate, regular rhythm, normal heart sounds and intact distal pulses.  Exam reveals no gallop and no friction rub.   No murmur heard. Pulmonary/Chest: Effort normal and breath sounds normal. No stridor. No respiratory distress. She has no wheezes. She has no rales. She exhibits no tenderness.  Abdominal: Soft. Bowel sounds are normal. She exhibits no distension and no mass. There is no tenderness. There is no rebound and no guarding.  Musculoskeletal: Normal range of motion. She exhibits no edema and no tenderness.  Lymphadenopathy:    She has no cervical adenopathy.  Neurological: She is alert and oriented to person, place, and time. She has normal reflexes. She displays normal reflexes. No cranial nerve deficit. She exhibits normal muscle tone. Coordination normal.  Skin: Skin is warm and dry. No rash noted. She is not diaphoretic. No erythema. No pallor.     Lab Results  Component Value Date   WBC 8.5 03/19/2013   HGB 12.3 03/19/2013   HCT 38.5 03/19/2013   PLT 246 03/19/2013   GLUCOSE 139* 03/18/2013   CHOL 194 03/05/2013   TRIG 143.0 03/05/2013   HDL 51.20 03/05/2013   LDLDIRECT 140.3 10/03/2012   LDLCALC 114* 03/05/2013   ALT 9 06/03/2012   AST 11 06/03/2012   NA 136* 03/18/2013   K 4.5 03/18/2013   CL 98 03/18/2013   CREATININE 1.23* 03/19/2013   BUN 25* 03/18/2013   CO2 25 03/18/2013   TSH 1.10 10/03/2011   INR 1.07 04/20/2011   HGBA1C 7.7* 03/05/2013   MICROALBUR 1.95* 02/09/2009      Assessment & Plan:

## 2013-04-04 NOTE — Patient Instructions (Signed)

## 2013-04-04 NOTE — Assessment & Plan Note (Signed)
She will cont the current meds for this 

## 2013-04-08 ENCOUNTER — Telehealth: Payer: Self-pay

## 2013-04-08 NOTE — Telephone Encounter (Signed)
Relevant patient education mailed to patient.  

## 2013-04-15 ENCOUNTER — Telehealth: Payer: Self-pay | Admitting: *Deleted

## 2013-04-15 NOTE — Telephone Encounter (Signed)
Ok with me 

## 2013-04-15 NOTE — Telephone Encounter (Signed)
OT phoned requesting order for skilled nursing for diabetic teaching for patient.  Please advise.  CB# [phone]

## 2013-04-16 NOTE — Telephone Encounter (Signed)
Phoned & gave [facility] for skilled nursing for diabetic teaching

## 2013-04-18 DIAGNOSIS — I209 Angina pectoris, unspecified: Secondary | ICD-10-CM | POA: Diagnosis not present

## 2013-04-18 DIAGNOSIS — I69959 Hemiplegia and hemiparesis following unspecified cerebrovascular disease affecting unspecified side: Secondary | ICD-10-CM

## 2013-04-18 DIAGNOSIS — E119 Type 2 diabetes mellitus without complications: Secondary | ICD-10-CM | POA: Diagnosis not present

## 2013-04-18 DIAGNOSIS — IMO0001 Reserved for inherently not codable concepts without codable children: Secondary | ICD-10-CM | POA: Diagnosis not present

## 2013-04-30 ENCOUNTER — Other Ambulatory Visit: Payer: Self-pay

## 2013-04-30 DIAGNOSIS — E1165 Type 2 diabetes mellitus with hyperglycemia: Principal | ICD-10-CM

## 2013-04-30 DIAGNOSIS — I10 Essential (primary) hypertension: Secondary | ICD-10-CM

## 2013-04-30 DIAGNOSIS — IMO0001 Reserved for inherently not codable concepts without codable children: Secondary | ICD-10-CM

## 2013-04-30 DIAGNOSIS — E1129 Type 2 diabetes mellitus with other diabetic kidney complication: Secondary | ICD-10-CM

## 2013-04-30 DIAGNOSIS — E785 Hyperlipidemia, unspecified: Secondary | ICD-10-CM

## 2013-04-30 MED ORDER — GLUCOSE BLOOD VI STRP: ORAL_STRIP | Status: DC

## 2013-04-30 MED ORDER — METFORMIN HCL 1000 MG PO TABS: 1000.0000 mg | ORAL_TABLET | Freq: Two times a day (BID) | ORAL | Status: DC

## 2013-04-30 MED ORDER — ONETOUCH BASIC SYSTEM W/DEVICE KIT: PACK | Status: DC

## 2013-04-30 MED ORDER — CLOPIDOGREL BISULFATE 75 MG PO TABS: 75.0000 mg | ORAL_TABLET | Freq: Every day | ORAL | Status: DC

## 2013-04-30 MED ORDER — ONETOUCH LANCETS MISC: Status: DC

## 2013-04-30 MED ORDER — CANAGLIFLOZIN 300 MG PO TABS: 1.0000 | ORAL_TABLET | Freq: Every day | ORAL | Status: DC

## 2013-04-30 MED ORDER — LOSARTAN POTASSIUM 100 MG PO TABS: 100.0000 mg | ORAL_TABLET | Freq: Every day | ORAL | Status: DC

## 2013-04-30 MED ORDER — LINAGLIPTIN 5 MG PO TABS: 5.0000 mg | ORAL_TABLET | Freq: Every day | ORAL | Status: DC

## 2013-04-30 MED ORDER — METOPROLOL TARTRATE 12.5 MG HALF TABLET: 12.5000 mg | ORAL_TABLET | Freq: Two times a day (BID) | ORAL | Status: DC

## 2013-04-30 MED ORDER — ATORVASTATIN CALCIUM 20 MG PO TABS: 20.0000 mg | ORAL_TABLET | Freq: Every day | ORAL | Status: DC

## 2013-04-30 MED ORDER — TAMSULOSIN HCL 0.4 MG PO CAPS: 0.4000 mg | ORAL_CAPSULE | Freq: Every day | ORAL | Status: DC

## 2013-04-30 NOTE — Telephone Encounter (Signed)
Granddaughter called requesting all rx refill along with new script for glucose monitor.

## 2013-05-06 ENCOUNTER — Ambulatory Visit: Payer: Medicare Other | Admitting: Internal Medicine

## 2013-05-26 DIAGNOSIS — IMO0001 Reserved for inherently not codable concepts without codable children: Secondary | ICD-10-CM | POA: Diagnosis not present

## 2013-05-26 DIAGNOSIS — H402290 Chronic angle-closure glaucoma, unspecified eye, stage unspecified: Secondary | ICD-10-CM | POA: Diagnosis not present

## 2013-05-26 DIAGNOSIS — H524 Presbyopia: Secondary | ICD-10-CM | POA: Diagnosis not present

## 2013-05-26 DIAGNOSIS — H409 Unspecified glaucoma: Secondary | ICD-10-CM | POA: Diagnosis not present

## 2013-05-26 LAB — HM DIABETES EYE EXAM

## 2013-07-02 ENCOUNTER — Ambulatory Visit: Payer: Medicare Other | Admitting: Internal Medicine

## 2013-07-23 ENCOUNTER — Other Ambulatory Visit (INDEPENDENT_AMBULATORY_CARE_PROVIDER_SITE_OTHER): Payer: Medicare Other

## 2013-07-23 ENCOUNTER — Encounter: Payer: Self-pay | Admitting: Internal Medicine

## 2013-07-23 ENCOUNTER — Ambulatory Visit (INDEPENDENT_AMBULATORY_CARE_PROVIDER_SITE_OTHER): Payer: Medicare Other | Admitting: Internal Medicine

## 2013-07-23 VITALS — BP 140/78 | HR 69 | Temp 97.8°F | Resp 16 | Ht 60.0 in | Wt 132.0 lb

## 2013-07-23 DIAGNOSIS — E1129 Type 2 diabetes mellitus with other diabetic kidney complication: Secondary | ICD-10-CM

## 2013-07-23 DIAGNOSIS — I10 Essential (primary) hypertension: Secondary | ICD-10-CM

## 2013-07-23 DIAGNOSIS — E1165 Type 2 diabetes mellitus with hyperglycemia: Principal | ICD-10-CM

## 2013-07-23 DIAGNOSIS — H814 Vertigo of central origin: Secondary | ICD-10-CM | POA: Diagnosis not present

## 2013-07-23 DIAGNOSIS — I209 Angina pectoris, unspecified: Secondary | ICD-10-CM | POA: Diagnosis not present

## 2013-07-23 DIAGNOSIS — Z8679 Personal history of other diseases of the circulatory system: Secondary | ICD-10-CM

## 2013-07-23 DIAGNOSIS — G4700 Insomnia, unspecified: Secondary | ICD-10-CM

## 2013-07-23 LAB — HEMOGLOBIN A1C: Hgb A1c MFr Bld: 7.5 % — ABNORMAL HIGH (ref 4.6–6.5)

## 2013-07-23 LAB — BASIC METABOLIC PANEL
BUN: 18 mg/dL (ref 6–23)
CO2: 25 mEq/L (ref 19–32)
Calcium: 9.5 mg/dL (ref 8.4–10.5)
Chloride: 106 mEq/L (ref 96–112)
Creatinine, Ser: 1 mg/dL (ref 0.4–1.2)
GFR: 67.29 mL/min (ref >60.00)
Glucose, Bld: 165 mg/dL — ABNORMAL HIGH (ref 70–99)
POTASSIUM: 3.8 meq/L (ref 3.5–5.1)
SODIUM: 140 meq/L (ref 135–145)

## 2013-07-23 NOTE — Patient Instructions (Signed)
Type 2 Diabetes Mellitus, Adult Type 2 diabetes mellitus, often simply referred to as type 2 diabetes, is a long-lasting (chronic) disease. In type 2 diabetes, the pancreas does not make enough insulin (a hormone), the cells are less responsive to the insulin that is made (insulin resistance), or both. Normally, insulin moves sugars from food into the tissue cells. The tissue cells use the sugars for energy. The lack of insulin or the lack of normal response to insulin causes excess sugars to build up in the blood instead of going into the tissue cells. As a result, high blood sugar (hyperglycemia) develops. The effect of high sugar (glucose) levels can cause many complications. Type 2 diabetes was also previously called adult-onset diabetes but it can occur at any age.  RISK FACTORS  A person is predisposed to developing type 2 diabetes if someone in the family has the disease and also has one or more of the following primary risk factors:  Overweight.  An inactive lifestyle.  A history of consistently eating high-calorie foods. Maintaining a normal weight and regular physical activity can reduce the chance of developing type 2 diabetes. SYMPTOMS  A person with type 2 diabetes may not show symptoms initially. The symptoms of type 2 diabetes appear slowly. The symptoms include:  Increased thirst (polydipsia).  Increased urination (polyuria).  Increased urination during the night (nocturia).  Weight loss. This weight loss may be rapid.  Frequent, recurring infections.  Tiredness (fatigue).  Weakness.  Vision changes, such as blurred vision.  Fruity smell to your breath.  Abdominal pain.  Nausea or vomiting.  Cuts or bruises which are slow to heal.  Tingling or numbness in the hands or feet. DIAGNOSIS Type 2 diabetes is frequently not diagnosed until complications of diabetes are present. Type 2 diabetes is diagnosed when symptoms or complications are present and when blood  glucose levels are increased. Your blood glucose level may be checked by one or more of the following blood tests:  A fasting blood glucose test. You will not be allowed to eat for at least 8 hours before a blood sample is taken.  A random blood glucose test. Your blood glucose is checked at any time of the day regardless of when you ate.  A hemoglobin A1c blood glucose test. A hemoglobin A1c test provides information about blood glucose control over the previous 3 months.  An oral glucose tolerance test (OGTT). Your blood glucose is measured after you have not eaten (fasted) for 2 hours and then after you drink a glucose-containing beverage. TREATMENT   You may need to take insulin or diabetes medicine daily to keep blood glucose levels in the desired range.  If you use insulin, you may need to adjust the dosage depending on the carbohydrates that you eat with each meal or snack. The treatment goal is to maintain the before meal blood sugar (preprandial glucose) level at 70-130 mg/dL. HOME CARE INSTRUCTIONS   Have your hemoglobin A1c level checked twice a year.  Perform daily blood glucose monitoring as directed by your health care provider.  Monitor urine ketones when you are ill and as directed by your health care provider.  Take your diabetes medicine or insulin as directed by your health care provider to maintain your blood glucose levels in the desired range.  Never run out of diabetes medicine or insulin. It is needed every day.  If you are using insulin, you may need to adjust the amount of insulin given based on your intake   of carbohydrates. Carbohydrates can raise blood glucose levels but need to be included in your diet. Carbohydrates provide vitamins, minerals, and fiber which are an essential part of a healthy diet. Carbohydrates are found in fruits, vegetables, whole grains, dairy products, legumes, and foods containing added sugars.  Eat healthy foods. You should make an  appointment to see a registered dietitian to help you create an eating plan that is right for you.  Lose weight if overweight.  Carry a medical alert card or wear your medical alert jewelry.  Carry a 15 gram carbohydrate snack with you at all times to treat low blood glucose (hypoglycemia). Some examples of 15 gram carbohydrate snacks include:  Glucose tablets, 3 or 4  Raisins, 2 tablespoons (24 grams)  Jelly beans, 6  Animal crackers, 8  Regular pop, 4 ounces (120 mL)  Gummy treats, 9  Recognize hypoglycemia. Hypoglycemia occurs with blood glucose levels of 70 mg/dL and below. The risk for hypoglycemia increases when fasting or skipping meals, during or after intense exercise, and during sleep. Hypoglycemia symptoms can include:  Tremors or shakes.  Decreased ability to concentrate.  Sweating.  Increased heart rate.  Headache.  Dry mouth.  Hunger.  Irritability.  Anxiety.  Restless sleep.  Altered speech or coordination.  Confusion.  Treat hypoglycemia promptly. If you are alert and able to safely swallow, follow the 15:15 rule:  Take 15-20 grams of rapid-acting glucose or carbohydrate. Rapid-acting options include glucose gel, glucose tablets, or 4 ounces (120 mL) of fruit juice, regular soda, or low fat milk.  Check your blood glucose level 15 minutes after taking the glucose.  Take 15-20 grams more of glucose if the repeat blood glucose level is still 70 mg/dL or below.  Eat a meal or snack within 1 hour once blood glucose levels return to normal.  Be alert to feeling very thirsty and urinating more frequently than usual, which are early signs of hyperglycemia. An early awareness of hyperglycemia allows for prompt treatment. Treat hyperglycemia as directed by your health care provider.  Engage in at least 150 minutes of moderate-intensity physical activity a week, spread over at least 3 days of the week or as directed by your health care provider. In  addition, you should engage in resistance exercise at least 2 times a week or as directed by your health care provider.  Adjust your medicine and food intake as needed if you start a new exercise or sport.  Follow your sick day plan at any time you are unable to eat or drink as usual.  Avoid tobacco use.  Limit alcohol intake to no more than 1 drink per day for nonpregnant women and 2 drinks per day for men. You should drink alcohol only when you are also eating food. Talk with your health care provider whether alcohol is safe for you. Tell your health care provider if you drink alcohol several times a week.  Follow up with your health care provider regularly.  Schedule an eye exam soon after the diagnosis of type 2 diabetes and then annually.  Perform daily skin and foot care. Examine your skin and feet daily for cuts, bruises, redness, nail problems, bleeding, blisters, or sores. A foot exam by a health care provider should be done annually.  Brush your teeth and gums at least twice a day and floss at least once a day. Follow up with your dentist regularly.  Share your diabetes management plan with your workplace or school.  Stay up-to-date with   immunizations.  Learn to manage stress.  Obtain ongoing diabetes education and support as needed.  Participate in, or seek rehabilitation as needed to maintain or improve independence and quality of life. Request a physical or occupational therapy referral if you are having foot or hand numbness or difficulties with grooming, dressing, eating, or physical activity. SEEK MEDICAL CARE IF:   You are unable to eat food or drink fluids for more than 6 hours.  You have nausea and vomiting for more than 6 hours.  Your blood glucose level is over 240 mg/dL.  There is a change in mental status.  You develop an additional serious illness.  You have diarrhea for more than 6 hours.  You have been sick or have had a fever for a couple of days  and are not getting better.  You have pain during any physical activity.  SEEK IMMEDIATE MEDICAL CARE IF:  You have difficulty breathing.  You have moderate to large ketone levels. MAKE SURE YOU:  Understand these instructions.  Will watch your condition.  Will get help right away if you are not doing well or get worse. Document Released: 01/23/2005 Document Revised: 01/28/2013 Document Reviewed: 08/22/2011 Exit[employer] Patient Information 2015 [employer]. This information is not intended to replace advice given to you by your health care provider. Make sure you discuss any questions you have with your health care provider.  

## 2013-07-23 NOTE — Assessment & Plan Note (Signed)
I will monitor her A1C and will address if needed

## 2013-07-23 NOTE — Progress Notes (Signed)
Subjective:    Patient ID: Michelle Lopez, female    DOB: 08/18/1945, 68 y.o.   MRN: [id number]  Diabetes She presents for her follow-up diabetic visit. She has type 2 diabetes mellitus. Her disease course has been stable. There are no hypoglycemic associated symptoms. Pertinent negatives for hypoglycemia include no confusion, dizziness, headaches, nervousness/anxiousness or tremors. Associated symptoms include weakness. Pertinent negatives for diabetes include no blurred vision, no chest pain, no fatigue, no foot paresthesias, no foot ulcerations, no polydipsia, no polyphagia, no polyuria, no visual change and no weight loss. There are no hypoglycemic complications. Symptoms are stable. 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 Current diabetic treatment includes oral agent (triple therapy). She is compliant with treatment most of the time. Her weight is stable. She is following a generally healthy diet. Meal planning includes avoidance of concentrated sweets. She has not had a previous visit with a dietician. She participates in exercise intermittently. There is no change in her home blood glucose trend. An ACE inhibitor/angiotensin II receptor blocker is being taken. She does not see a podiatrist.Eye exam is current.      Review of Systems  Constitutional: Negative for chills, weight loss, diaphoresis, appetite change and fatigue.  HENT: Negative.   Eyes: Negative.  Negative for blurred vision.  Respiratory: Negative.  Negative for cough, choking, chest tightness, shortness of breath and stridor.   Cardiovascular: Negative.  Negative for chest pain, palpitations and leg swelling.  Gastrointestinal: Negative.  Negative for nausea, vomiting, abdominal pain, diarrhea, constipation and blood in stool.  Endocrine: Negative.  Negative for polydipsia, polyphagia and polyuria.  Genitourinary: Negative.   Musculoskeletal: Negative.  Negative for arthralgias, back pain, gait problem, myalgias  and neck pain.  Skin: Negative.   Allergic/Immunologic: Negative.   Neurological: Positive for weakness. Negative for dizziness, tremors, light-headedness, numbness and headaches.       She has had intermittent episodes of feeling like her legs would buckle underneath her, she has a spontaneous fall about one month ago while walking in a parking lot. She did not hurt herself.  Hematological: Negative.  Negative for adenopathy. Does not bruise/bleed easily.  Psychiatric/Behavioral: Negative.  Negative for suicidal ideas, hallucinations, behavioral problems, confusion, sleep disturbance, self-injury, dysphoric mood, decreased concentration and agitation. The patient is not nervous/anxious and is not hyperactive.        Objective:   Physical Exam  Vitals reviewed. Constitutional: She is oriented to person, place, and time. She appears well-developed and well-nourished. No distress.  HENT:  Head: Normocephalic and atraumatic.  Mouth/Throat: Oropharynx is clear and moist. No oropharyngeal exudate.  Eyes: Conjunctivae are normal. Right eye exhibits no discharge. Left eye exhibits no discharge. No scleral icterus.  Neck: Normal range of motion. Neck supple. No JVD present. No tracheal deviation present. No thyromegaly present.  Cardiovascular: Normal rate, regular rhythm, normal heart sounds and intact distal pulses.  Exam reveals no gallop and no friction rub.   No murmur heard. Pulmonary/Chest: Effort normal and breath sounds normal. No stridor. No respiratory distress. She has no wheezes. She has no rales. She exhibits no tenderness.  Abdominal: Soft. Bowel sounds are normal. She exhibits no distension and no mass. There is no tenderness. There is no rebound and no guarding.  Musculoskeletal: Normal range of motion. She exhibits no edema and no tenderness.  Lymphadenopathy:    She has no cervical adenopathy.  Neurological: She is alert and oriented to person, place, and time. She displays  normal reflexes. No cranial nerve  deficit. She exhibits normal muscle tone. Coordination normal.  Skin: Skin is warm and dry. No rash noted. She is not diaphoretic. No erythema. No pallor.  Psychiatric: She has a normal mood and affect. Her behavior is normal. Judgment and thought content normal.      Lab Results  Component Value Date   WBC 8.5 03/19/2013   HGB 12.3 03/19/2013   HCT 38.5 03/19/2013   PLT 246 03/19/2013   GLUCOSE 139* 03/18/2013   CHOL 194 03/05/2013   TRIG 143.0 03/05/2013   HDL 51.20 03/05/2013   LDLDIRECT 140.3 10/03/2012   LDLCALC 114* 03/05/2013   ALT 9 06/03/2012   AST 11 06/03/2012   NA 136* 03/18/2013   K 4.5 03/18/2013   CL 98 03/18/2013   CREATININE 1.23* 03/19/2013   BUN 25* 03/18/2013   CO2 25 03/18/2013   TSH 1.10 10/03/2011   INR 1.07 04/20/2011   HGBA1C 7.7* 03/05/2013   MICROALBUR 1.95* 02/09/2009      Assessment & Plan:

## 2013-07-23 NOTE — Assessment & Plan Note (Signed)
I don't see any new or focal deficits but I do think she should work with PT/OT to improve her balance, strength, indurance

## 2013-07-23 NOTE — Progress Notes (Signed)
Pre visit review using our clinic review tool, if applicable. No additional management support is needed unless otherwise documented below in the visit note. 

## 2013-07-23 NOTE — Assessment & Plan Note (Signed)
Her BP is well controlled I will monitor her lytes and renal function 

## 2013-07-24 ENCOUNTER — Telehealth: Payer: Self-pay | Admitting: Internal Medicine

## 2013-07-24 NOTE — Telephone Encounter (Signed)
Relevant patient education mailed to patient.  

## 2013-07-29 ENCOUNTER — Telehealth: Payer: Self-pay | Admitting: *Deleted

## 2013-07-29 NOTE — Telephone Encounter (Signed)
Left msg on triage requesting pt last A1C result. Called Michelle Lopez back gave her last a1c from 6/17.Marland KitchenJohny Chess

## 2013-08-18 ENCOUNTER — Telehealth: Payer: Self-pay | Admitting: Internal Medicine

## 2013-08-18 MED ORDER — METOPROLOL TARTRATE 12.5 MG HALF TABLET: 12.5000 mg | ORAL_TABLET | Freq: Two times a day (BID) | ORAL | Status: DC

## 2013-08-18 NOTE — Telephone Encounter (Signed)
Patient's daughter called to request a new prescription to be sent to the pt's pharmacy for metoprolol tartrate (LOPRESSOR) 12.5 mg.  She uses [facility]. Please advise.

## 2013-10-08 ENCOUNTER — Telehealth: Payer: Self-pay | Admitting: Internal Medicine

## 2013-10-08 NOTE — Telephone Encounter (Signed)
Pt daughter came by to request letter from Dr Ronnald Ramp stating that due to medical conditions her mother needs to have a live-in caregiver. Her daughter states she needs this letter submitted ASAP. Please contact pt when request is reviewed.

## 2013-10-08 NOTE — Telephone Encounter (Signed)
Please draft a letter

## 2013-10-09 NOTE — Telephone Encounter (Signed)
Requested letter created and ready for pick up.

## 2013-10-10 NOTE — Telephone Encounter (Signed)
Care giver notified.

## 2013-10-28 ENCOUNTER — Ambulatory Visit: Payer: Medicare Other | Admitting: Internal Medicine

## 2013-10-31 ENCOUNTER — Encounter: Payer: Self-pay | Admitting: Internal Medicine

## 2013-10-31 ENCOUNTER — Ambulatory Visit (INDEPENDENT_AMBULATORY_CARE_PROVIDER_SITE_OTHER): Payer: Medicare Other | Admitting: Internal Medicine

## 2013-10-31 ENCOUNTER — Other Ambulatory Visit (INDEPENDENT_AMBULATORY_CARE_PROVIDER_SITE_OTHER): Payer: Medicare Other

## 2013-10-31 VITALS — BP 120/78 | HR 70 | Temp 98.2°F | Resp 16 | Ht 60.0 in | Wt 132.0 lb

## 2013-10-31 DIAGNOSIS — E1129 Type 2 diabetes mellitus with other diabetic kidney complication: Secondary | ICD-10-CM | POA: Diagnosis not present

## 2013-10-31 DIAGNOSIS — I10 Essential (primary) hypertension: Secondary | ICD-10-CM | POA: Diagnosis not present

## 2013-10-31 DIAGNOSIS — E785 Hyperlipidemia, unspecified: Secondary | ICD-10-CM

## 2013-10-31 DIAGNOSIS — Z0000 Encounter for general adult medical examination without abnormal findings: Secondary | ICD-10-CM

## 2013-10-31 DIAGNOSIS — I209 Angina pectoris, unspecified: Secondary | ICD-10-CM | POA: Diagnosis not present

## 2013-10-31 DIAGNOSIS — E1165 Type 2 diabetes mellitus with hyperglycemia: Principal | ICD-10-CM

## 2013-10-31 DIAGNOSIS — Z23 Encounter for immunization: Secondary | ICD-10-CM | POA: Diagnosis not present

## 2013-10-31 LAB — LIPID PANEL
CHOL/HDL RATIO: 4
Cholesterol: 198 mg/dL (ref 0–200)
HDL: 51.1 mg/dL (ref >39.00)
LDL CALC: 116 mg/dL — AB (ref 0–99)
NonHDL: 146.9
TRIGLYCERIDES: 153 mg/dL — AB (ref 0.0–149.0)
VLDL: 30.6 mg/dL (ref 0.0–40.0)

## 2013-10-31 LAB — BASIC METABOLIC PANEL
BUN: 20 mg/dL (ref 6–23)
CO2: 27 mEq/L (ref 19–32)
Calcium: 9.7 mg/dL (ref 8.4–10.5)
Chloride: 103 mEq/L (ref 96–112)
Creatinine, Ser: 1.2 mg/dL (ref 0.4–1.2)
GFR: 58.69 mL/min — AB (ref >60.00)
Glucose, Bld: 162 mg/dL — ABNORMAL HIGH (ref 70–99)
Potassium: 4.5 mEq/L (ref 3.5–5.1)
Sodium: 139 mEq/L (ref 135–145)

## 2013-10-31 LAB — HEMOGLOBIN A1C: Hgb A1c MFr Bld: 7.2 % — ABNORMAL HIGH (ref 4.6–6.5)

## 2013-10-31 LAB — TSH: TSH: 1.98 u[IU]/mL (ref 0.35–4.50)

## 2013-10-31 NOTE — Progress Notes (Signed)
Subjective:    Patient ID: Michelle Lopez, female    DOB: 12/02/1945, 68 y.o.   MRN: [id number]  Diabetes She presents for her follow-up diabetic visit. She has type 2 diabetes mellitus. Her disease course has been stable. There are no hypoglycemic associated symptoms. Pertinent negatives for diabetes include no blurred vision, no chest pain, no fatigue, no foot paresthesias, no foot ulcerations, no polydipsia, no polyphagia, no polyuria, no visual change, no weakness and no weight loss. There are no hypoglycemic complications. Symptoms are stable. Diabetic complications include a CVA and PVD. Current diabetic treatment includes oral agent (triple therapy). She is compliant with treatment most of the time. Her weight is stable. She is following a generally healthy diet. Meal planning includes avoidance of concentrated sweets. She has not had a previous visit with a dietician. She participates in exercise intermittently. There is no change in her home blood glucose trend. An ACE inhibitor/angiotensin II receptor blocker is being taken. She does not see a podiatrist.Eye exam is current.      Review of Systems  Constitutional: Negative.  Negative for fever, chills, weight loss, diaphoresis, appetite change and fatigue.  HENT: Negative.   Eyes: Negative.  Negative for blurred vision.  Respiratory: Negative.  Negative for cough, choking, chest tightness, shortness of breath, wheezing and stridor.   Cardiovascular: Negative.  Negative for chest pain, palpitations and leg swelling.  Gastrointestinal: Negative.  Negative for nausea, vomiting, abdominal pain, diarrhea, constipation and blood in stool.  Endocrine: Negative.  Negative for polydipsia, polyphagia and polyuria.  Genitourinary: Negative.   Musculoskeletal: Negative.  Negative for arthralgias, back pain, joint swelling and myalgias.  Skin: Negative.  Negative for rash.  Allergic/Immunologic: Negative.   Neurological: Negative.  Negative for  weakness.  Hematological: Negative.  Negative for adenopathy. Does not bruise/bleed easily.  Psychiatric/Behavioral: Negative.        Objective:   Physical Exam  Vitals reviewed. Constitutional: She is oriented to person, place, and time. She appears well-developed and well-nourished. No distress.  HENT:  Head: Normocephalic and atraumatic.  Mouth/Throat: Oropharynx is clear and moist. No oropharyngeal exudate.  Eyes: Conjunctivae are normal. Right eye exhibits no discharge. Left eye exhibits no discharge. No scleral icterus.  Neck: Normal range of motion. Neck supple. No JVD present. No tracheal deviation present. No thyromegaly present.  Cardiovascular: Normal rate, regular rhythm, normal heart sounds and intact distal pulses.  Exam reveals no gallop and no friction rub.   No murmur heard. Pulmonary/Chest: Effort normal and breath sounds normal. No stridor. No respiratory distress. She has no wheezes. She has no rales. She exhibits no tenderness.  Abdominal: Soft. Bowel sounds are normal. She exhibits no distension and no mass. There is no tenderness. There is no rebound and no guarding.  Musculoskeletal: Normal range of motion. She exhibits no edema and no tenderness.  Lymphadenopathy:    She has no cervical adenopathy.  Neurological: She is oriented to person, place, and time.  Skin: Skin is warm and dry. No rash noted. She is not diaphoretic. No erythema. No pallor.     Lab Results  Component Value Date   WBC 8.5 03/19/2013   HGB 12.3 03/19/2013   HCT 38.5 03/19/2013   PLT 246 03/19/2013   GLUCOSE 165* 07/23/2013   CHOL 194 03/05/2013   TRIG 143.0 03/05/2013   HDL 51.20 03/05/2013   LDLDIRECT 140.3 10/03/2012   LDLCALC 114* 03/05/2013   ALT 9 06/03/2012   AST 11 06/03/2012   NA 140  07/23/2013   K 3.8 07/23/2013   CL 106 07/23/2013   CREATININE 1.0 07/23/2013   BUN 18 07/23/2013   CO2 25 07/23/2013   TSH 1.10 10/03/2011   INR 1.07 04/20/2011   HGBA1C 7.5* 07/23/2013   MICROALBUR  1.95* 02/09/2009       Assessment & Plan:

## 2013-10-31 NOTE — Progress Notes (Signed)
Pre visit review using our clinic review tool, if applicable. No additional management support is needed unless otherwise documented below in the visit note. 

## 2013-10-31 NOTE — Patient Instructions (Signed)

## 2013-11-02 MED ORDER — EZETIMIBE 10 MG PO TABS: 10.0000 mg | ORAL_TABLET | Freq: Every day | ORAL | Status: DC

## 2013-11-02 NOTE — Assessment & Plan Note (Signed)
Her blood sugars are adequately well controlled Her renal function is stable

## 2013-11-02 NOTE — Assessment & Plan Note (Signed)
Her BP is well controlled Her lytes and renal function are stable 

## 2013-11-02 NOTE — Assessment & Plan Note (Signed)
She has not achieved her LDL goal Will add zetia

## 2013-11-03 ENCOUNTER — Encounter: Payer: Self-pay | Admitting: Gastroenterology

## 2013-11-13 ENCOUNTER — Telehealth: Payer: Self-pay | Admitting: Internal Medicine

## 2013-11-13 DIAGNOSIS — G4700 Insomnia, unspecified: Secondary | ICD-10-CM

## 2013-11-13 MED ORDER — DOXEPIN HCL 6 MG PO TABS: 1.0000 | ORAL_TABLET | Freq: Every evening | ORAL | Status: DC | PRN

## 2013-11-13 NOTE — Telephone Encounter (Signed)
Try silenor

## 2013-11-13 NOTE — Telephone Encounter (Signed)
Notified [name] with md response...[name]

## 2013-11-13 NOTE — Telephone Encounter (Signed)
Daughter, Clayborne Artist called to report that the patient is having problems sleeping at night.  She wants to know if she can be prescribed something for that.

## 2013-12-25 ENCOUNTER — Ambulatory Visit (INDEPENDENT_AMBULATORY_CARE_PROVIDER_SITE_OTHER): Payer: Medicare Other | Admitting: Physician Assistant

## 2013-12-25 ENCOUNTER — Encounter: Payer: Self-pay | Admitting: Physician Assistant

## 2013-12-25 VITALS — BP 140/70 | HR 84 | Ht 60.0 in | Wt 128.0 lb

## 2013-12-25 DIAGNOSIS — Z7901 Long term (current) use of anticoagulants: Secondary | ICD-10-CM

## 2013-12-25 DIAGNOSIS — Z1211 Encounter for screening for malignant neoplasm of colon: Secondary | ICD-10-CM

## 2013-12-25 DIAGNOSIS — I209 Angina pectoris, unspecified: Secondary | ICD-10-CM | POA: Diagnosis not present

## 2013-12-25 NOTE — Patient Instructions (Signed)
We have ordered a Cologuard test. You will be contacted by this company. They will be mailing you a box with the test kit. We have given you a booklet with step by step instructions. The kit will also have a mailing label to use to mail the kit back to the company. This will be prescertified with your Medicare.  We will notify you with the test results.

## 2013-12-25 NOTE — Progress Notes (Signed)
Reviewed and agree with management. Finbar Nippert D. Louise Victory, M.D., FACG  

## 2013-12-25 NOTE — Progress Notes (Signed)
Patient ID: Ceri Mayer, female   DOB: December 12, 1945, 68 y.o.   MRN: [id number]   Subjective:    Patient ID: [name], female    DOB: 05/16/1945, 68 y.o.   MRN: [id number]  HPI Phillipa is a pleasant 68 year old African-American female known previously to Dr. Sharlett Iles who had been seen in 2013 with complaints of dysphagia post-CVA. Patient is referred today for colon cancer screening by her PCP Dr. Ronnald Ramp. Patient is asymptomatic, specifically has no complaints of changes in bowel habits constipation diarrhea abdominal pain melena or hematochezia. Family history is negative for colon cancer. She has not had any prior colon screening and when evaluated in 2013 per Dr. Sharlett Iles was not felt to be an appropriate candidate for colonoscopy Patient has history of adult-onset diabetes mellitus, peripheral vascular disease, dementia, neurogenic dysphagia, and has had a total of 7 strokes by the patient's report. The last was several years ago.  Review of Systems  Outpatient Encounter Prescriptions as of 12/25/2013  Medication Sig  . aspirin 81 MG tablet Take 81 mg by mouth daily.    Marland Kitchen atorvastatin (LIPITOR) 20 MG tablet Take 1 tablet (20 mg total) by mouth daily.  . Blood Glucose Monitoring Suppl ([place]) W/DEVICE KIT Test up to TID dx: 250.42  . Canagliflozin (INVOKANA) 300 MG TABS Take 1 tablet (300 mg total) by mouth daily.  . clopidogrel (PLAVIX) 75 MG tablet Take 1 tablet (75 mg total) by mouth daily.  Marland Kitchen desonide (DESOWEN) 0.05 % lotion Apply topically 2 (two) times daily.  . Doxepin HCl (SILENOR) 6 MG TABS Take 1 tablet (6 mg total) by mouth at bedtime as needed.  . ezetimibe (ZETIA) 10 MG tablet Take 1 tablet (10 mg total) by mouth daily.  Marland Kitchen glucose blood test strip Test up to TID dx: 250.42  . ketoconazole (NIZORAL) 2 % cream Apply 1 application topically 2 (two) times daily.  Marland Kitchen linagliptin (TRADJENTA) 5 MG TABS tablet Take 1 tablet (5 mg total) by mouth daily.  Marland Kitchen losartan  (COZAAR) 100 MG tablet Take 1 tablet (100 mg total) by mouth daily.  . metFORMIN (GLUCOPHAGE) 1000 MG tablet Take 1 tablet (1,000 mg total) by mouth 2 (two) times daily with a meal.  . metoprolol tartrate (LOPRESSOR) 12.5 mg TABS tablet Take 0.5 tablets (12.5 mg total) by mouth 2 (two) times daily.  . ONE TOUCH LANCETS MISC Test up to TID dx: 250.42  . PRODIGY TWIST TOP LANCETS 28G MISC USE TO CHECK BLOOD SUGAR TWICE DAILY  . tamsulosin (FLOMAX) 0.4 MG CAPS capsule Take 1 capsule (0.4 mg total) by mouth daily.   Allergies  Allergen Reactions  . Crestor [Rosuvastatin Calcium]     myalgias  . Morphine Hives  . Pravastatin Sodium Rash  . Simvastatin Rash   Patient Active Problem List   Diagnosis Date Noted  . 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 Seborrheic dermatitis 03/05/2013  . Dysphagia as late effect of stroke 05/30/2011  . 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 Routine general medical examination at a health care facility 05/02/2011  . Hyperlipidemia with target LDL less than 70   . Other screening mammogram 09/15/2010  . OSTEOARTHRITIS, HIP, RIGHT 07/03/2009  . DEPRESSION 06/24/2009  . VERTIGO, CENTRAL 01/14/2009  . Insomnia 01/14/2009  . LEFT ACUTE POSTERIOR CIRCULATION INFARCT 06/11/2008  . Type II or unspecified type diabetes mellitus with renal manifestations, uncontrolled(250.42) 06/05/2007  . Essential hypertension, benign 06/05/2007  History   Social History  . Marital Status: Divorced    Spouse Name: N/A    Number of Children: 83  . Years of Education: N/A   Occupational History  . Disability    Social History Main Topics  . Smoking status: Never Smoker   . Smokeless tobacco: Never Used  . Alcohol Use: No  . Drug Use: No  . Sexual Activity: Not Currently   Other Topics Concern  . Not on file   Social History Narrative   Daily caffeine     Ms. Cuthrell's family history is negative for Colon cancer.        Objective:    Filed Vitals:   12/25/13 0853  BP: 140/70  Pulse: 84    Physical Exam  well-developed older African-American female in no acute distress, she ambulates with a cane and is accompanied by her daughter at 5 foot weight 128. HEENT; nontraumatic, normocephalic EOMI PERRLA sclera anicteric, Supple ;no JVD, Cardiovascular; regular rate and rhythm with S1-S2 no murmur or gallop, Pulmonary; clear bilaterally, Abdomen; soft nontender nondistended bowel sounds are active there is no palpable mass or hepatosplenomegaly, Rectal; not done, Extremities; no clubbing cyanosis or edema she does have residual weakness in her left upper and lower extremity, Psych; mood and affect appropriate       Assessment & Plan:   #21  68 year old African-American female, asymptomatic and of average risk referred for colon neoplasia screening. #2 chronic antiplatelet therapy with Plavix and aspirin #3 history of multiple previous CVAs #4 peripheral vascular disease #5 mild neurogenic dysphagia #6 diabetes mellitus  Plan; patient is a less than optimal candidate to come off of anticoagulation for a screening procedure. We discussed alternate means of colon neoplasia surveillance and we will proceed with Cologard stool DNA testing. She is agreeable and understands that if this test is positive we may have to rediscuss colonoscopy. If test is negative she will not require any further GI intervention  Amy Genia Harold PA-C 12/25/2013

## 2013-12-31 ENCOUNTER — Encounter: Payer: Medicare Other | Admitting: Gastroenterology

## 2014-03-02 ENCOUNTER — Ambulatory Visit: Payer: Medicare Other | Admitting: Internal Medicine

## 2014-03-03 ENCOUNTER — Other Ambulatory Visit: Payer: Self-pay | Admitting: Internal Medicine

## 2014-03-03 ENCOUNTER — Encounter: Payer: Self-pay | Admitting: Internal Medicine

## 2014-03-03 ENCOUNTER — Other Ambulatory Visit (INDEPENDENT_AMBULATORY_CARE_PROVIDER_SITE_OTHER): Payer: Medicare Other

## 2014-03-03 ENCOUNTER — Ambulatory Visit (INDEPENDENT_AMBULATORY_CARE_PROVIDER_SITE_OTHER): Payer: Medicare Other | Admitting: Internal Medicine

## 2014-03-03 VITALS — BP 130/80 | HR 81 | Temp 98.3°F | Resp 16 | Ht 60.0 in | Wt 138.0 lb

## 2014-03-03 DIAGNOSIS — M8580 Other specified disorders of bone density and structure, unspecified site: Secondary | ICD-10-CM | POA: Insufficient documentation

## 2014-03-03 DIAGNOSIS — I69391 Dysphagia following cerebral infarction: Secondary | ICD-10-CM | POA: Diagnosis not present

## 2014-03-03 DIAGNOSIS — E1121 Type 2 diabetes mellitus with diabetic nephropathy: Secondary | ICD-10-CM

## 2014-03-03 DIAGNOSIS — I10 Essential (primary) hypertension: Secondary | ICD-10-CM

## 2014-03-03 DIAGNOSIS — IMO0001 Reserved for inherently not codable concepts without codable children: Secondary | ICD-10-CM

## 2014-03-03 DIAGNOSIS — Z1231 Encounter for screening mammogram for malignant neoplasm of breast: Secondary | ICD-10-CM

## 2014-03-03 DIAGNOSIS — I638 Other cerebral infarction: Secondary | ICD-10-CM | POA: Diagnosis not present

## 2014-03-03 DIAGNOSIS — N6320 Unspecified lump in the left breast, unspecified quadrant: Secondary | ICD-10-CM

## 2014-03-03 DIAGNOSIS — H8143 Vertigo of central origin, bilateral: Secondary | ICD-10-CM | POA: Diagnosis not present

## 2014-03-03 DIAGNOSIS — I6389 Other cerebral infarction: Secondary | ICD-10-CM

## 2014-03-03 LAB — BASIC METABOLIC PANEL
BUN: 15 mg/dL (ref 6–23)
CHLORIDE: 104 meq/L (ref 96–112)
CO2: 28 mEq/L (ref 19–32)
Calcium: 9.2 mg/dL (ref 8.4–10.5)
Creatinine, Ser: 0.95 mg/dL (ref 0.40–1.20)
GFR: 74.56 mL/min (ref >60.00)
Glucose, Bld: 155 mg/dL — ABNORMAL HIGH (ref 70–99)
Potassium: 4.5 mEq/L (ref 3.5–5.1)
SODIUM: 139 meq/L (ref 135–145)

## 2014-03-03 LAB — HEMOGLOBIN A1C: HEMOGLOBIN A1C: 7.4 % — AB (ref 4.6–6.5)

## 2014-03-03 NOTE — Progress Notes (Signed)
Pre visit review using our clinic review tool, if applicable. No additional management support is needed unless otherwise documented below in the visit note. 

## 2014-03-03 NOTE — Progress Notes (Signed)
Subjective:    Patient ID: Michelle Lopez, female    DOB: 1945-09-29, 69 y.o.   MRN: [id number]  Diabetes She presents for her follow-up diabetic visit. She has type 2 diabetes mellitus. Her disease course has been stable. Hypoglycemia symptoms include dizziness. Pertinent negatives for hypoglycemia include no headaches or tremors. Pertinent negatives for diabetes include no blurred vision, no chest pain, no fatigue, no foot paresthesias, no foot ulcerations, no polydipsia, no polyphagia, no polyuria, no visual change, no weakness and no weight loss. There are no hypoglycemic complications. Symptoms are stable. Diabetic complications include a CVA. Current diabetic treatment includes oral agent (dual therapy). She is compliant with treatment all of the time. Her weight is stable. She is following a generally healthy diet. Meal planning includes avoidance of concentrated sweets. She participates in exercise intermittently. There is no change in her home blood glucose trend. An ACE inhibitor/angiotensin II receptor blocker is being taken. She does not see a podiatrist.Eye exam is current.      Review of Systems  Constitutional: Negative.  Negative for chills, weight loss, diaphoresis, appetite change and fatigue.  HENT: Negative.   Eyes: Negative.  Negative for blurred vision.  Respiratory: Negative.  Negative for cough, choking, chest tightness, shortness of breath and stridor.   Cardiovascular: Negative.  Negative for chest pain, palpitations and leg swelling.  Gastrointestinal: Negative.  Negative for nausea, vomiting, abdominal pain, diarrhea, constipation and blood in stool.  Endocrine: Negative.  Negative for polydipsia, polyphagia and polyuria.  Genitourinary: Negative.   Musculoskeletal: Negative.  Negative for myalgias, back pain, arthralgias and neck pain.  Skin: Negative.  Negative for rash.  Allergic/Immunologic: Negative.   Neurological: Positive for dizziness and light-headedness.  Negative for tremors, syncope, facial asymmetry, weakness, numbness and headaches.       She complains of persistent and worsening vertigo with dizziness  Hematological: Negative.  Negative for adenopathy. Does not bruise/bleed easily.  Psychiatric/Behavioral: Negative.        Objective:   Physical Exam  Constitutional: She is oriented to person, place, and time. She appears well-developed and well-nourished. No distress.  HENT:  Head: Normocephalic and atraumatic.  Mouth/Throat: Oropharynx is clear and moist. No oropharyngeal exudate.  Eyes: Conjunctivae are normal. Right eye exhibits no discharge. Left eye exhibits no discharge. No scleral icterus.  Neck: Normal range of motion. Neck supple. No JVD present. No tracheal deviation present. No thyromegaly present.  Cardiovascular: Normal rate, regular rhythm, normal heart sounds and intact distal pulses.  Exam reveals no gallop and no friction rub.   No murmur heard. Pulmonary/Chest: Effort normal and breath sounds normal. No stridor. No respiratory distress. She has no wheezes. She has no rales. She exhibits no tenderness.  Abdominal: Soft. Bowel sounds are normal. She exhibits no distension and no mass. There is no tenderness. There is no rebound and no guarding.  Musculoskeletal: Normal range of motion. She exhibits no edema or tenderness.  Lymphadenopathy:    She has no cervical adenopathy.  Neurological: She is oriented to person, place, and time.  Skin: Skin is warm and dry. No rash noted. She is not diaphoretic. No erythema. No pallor.  Vitals reviewed.    Lab Results  Component Value Date   WBC 8.5 03/19/2013   HGB 12.3 03/19/2013   HCT 38.5 03/19/2013   PLT 246 03/19/2013   GLUCOSE 162* 10/31/2013   CHOL 198 10/31/2013   TRIG 153.0* 10/31/2013   HDL 51.10 10/31/2013   LDLDIRECT 140.3 10/03/2012  LDLCALC 116* 10/31/2013   ALT 9 06/03/2012   AST 11 06/03/2012   NA 139 10/31/2013   K 4.5 10/31/2013   CL 103  10/31/2013   CREATININE 1.2 10/31/2013   BUN 20 10/31/2013   CO2 27 10/31/2013   TSH 1.98 10/31/2013   INR 1.07 04/20/2011   HGBA1C 7.2* 10/31/2013   MICROALBUR 1.95* 02/09/2009       Assessment & Plan:

## 2014-03-03 NOTE — Patient Instructions (Signed)

## 2014-03-04 ENCOUNTER — Telehealth: Payer: Self-pay | Admitting: Internal Medicine

## 2014-03-04 NOTE — Assessment & Plan Note (Signed)
Her BP is well controlled I will monitor her lytes and renal function today 

## 2014-03-04 NOTE — Assessment & Plan Note (Signed)
She does not appear to me to have had another CVA, she has had vertigo s/p the prior CVA, will ask her to see neurology to see if there is anything that can be done to help her with these symptoms

## 2014-03-04 NOTE — Telephone Encounter (Signed)
Pt daughter called in today requesting a call back from nurse.  If possible, a couple of quick questions [phone] [facility]

## 2014-03-05 NOTE — Telephone Encounter (Signed)
Daughter given date of DM and stroke diagnosis and advised that she will need to report to medical records in order to get any records.

## 2014-03-13 ENCOUNTER — Ambulatory Visit
Admission: RE | Admit: 2014-03-13 | Discharge: 2014-03-13 | Disposition: A | Discharge status: Home / Self Care | Payer: Medicare Other | Source: Ambulatory Visit | Attending: Internal Medicine | Admitting: Internal Medicine

## 2014-03-13 DIAGNOSIS — R922 Inconclusive mammogram: Secondary | ICD-10-CM | POA: Diagnosis not present

## 2014-03-13 DIAGNOSIS — N6320 Unspecified lump in the left breast, unspecified quadrant: Secondary | ICD-10-CM

## 2014-03-13 LAB — HM MAMMOGRAPHY: HM Mammogram: NORMAL

## 2014-03-24 ENCOUNTER — Ambulatory Visit
Admission: RE | Admit: 2014-03-24 | Discharge: 2014-03-24 | Disposition: A | Discharge status: Home / Self Care | Payer: Medicare Other | Source: Ambulatory Visit | Attending: Internal Medicine | Admitting: Internal Medicine

## 2014-03-24 DIAGNOSIS — M899 Disorder of bone, unspecified: Secondary | ICD-10-CM | POA: Diagnosis not present

## 2014-03-24 DIAGNOSIS — M8580 Other specified disorders of bone density and structure, unspecified site: Secondary | ICD-10-CM

## 2014-03-24 LAB — HM DEXA SCAN
HM Dexa Scan: -2.1
HM Dexa Scan: -2.1

## 2014-03-31 ENCOUNTER — Telehealth: Payer: Self-pay | Admitting: *Deleted

## 2014-03-31 NOTE — Telephone Encounter (Signed)
This patient saw Nicoletta Ba PA-C 12-25-2013.  We ordered a Cologuard test.  The patient didn't do the test.  [name] her caregiver ( daughter) said she still has her kit.  We received a fax on 03-30-2014 that states the test was cancelled because an empty collection kit was received at their lab.  [name] said she will have her mother do the stool sample on Monday 04-06-2014.  I did urge her to look at the time table at the back of the book. If she collects the stool sample on Monday she has to ship it out by Wednesday at any [employer] store.  She verbalized understanding.

## 2014-04-02 DIAGNOSIS — H04123 Dry eye syndrome of bilateral lacrimal glands: Secondary | ICD-10-CM | POA: Diagnosis not present

## 2014-04-02 DIAGNOSIS — H01003 Unspecified blepharitis right eye, unspecified eyelid: Secondary | ICD-10-CM | POA: Diagnosis not present

## 2014-05-05 ENCOUNTER — Ambulatory Visit: Payer: Medicare Other | Admitting: Neurology

## 2014-05-06 ENCOUNTER — Other Ambulatory Visit: Payer: Self-pay | Admitting: Internal Medicine

## 2014-05-06 ENCOUNTER — Telehealth: Payer: Self-pay | Admitting: Internal Medicine

## 2014-05-06 DIAGNOSIS — G4700 Insomnia, unspecified: Secondary | ICD-10-CM

## 2014-05-06 DIAGNOSIS — I10 Essential (primary) hypertension: Secondary | ICD-10-CM

## 2014-05-06 NOTE — Telephone Encounter (Signed)
Pt called stated beside what the phtysicain pharmacy request she also need, Metoprolol,tamsulosin 0.4mg  and her sleeping medication (cant remember the name of it). Please help, she is trying to get it done ASAP so they can deliver on Friday.

## 2014-05-07 MED ORDER — METOPROLOL TARTRATE 25 MG PO TABS: 12.5000 mg | ORAL_TABLET | Freq: Two times a day (BID) | ORAL | Status: DC

## 2014-05-07 MED ORDER — DOXEPIN HCL 6 MG PO TABS: 1.0000 | ORAL_TABLET | Freq: Every evening | ORAL | Status: DC | PRN

## 2014-05-07 MED ORDER — TAMSULOSIN HCL 0.4 MG PO CAPS: 0.4000 mg | ORAL_CAPSULE | Freq: Every day | ORAL | Status: DC

## 2014-05-07 NOTE — Telephone Encounter (Signed)
All meds sent to listed pharmacy

## 2014-05-07 NOTE — Telephone Encounter (Signed)
Notified pt spoke with daughter [name]) inform md sent refills to pharmacy...Michelle Lopez

## 2014-05-11 ENCOUNTER — Telehealth: Payer: Self-pay | Admitting: Neurology

## 2014-05-11 NOTE — Telephone Encounter (Signed)
NP appt w/ Dr. Tomi Likens on 05/05/14 was no showed. No show letter mailed to pt. Dr. Scarlette Calico office notified via [facility] referral note / Sherri S.

## 2014-06-02 DIAGNOSIS — H35371 Puckering of macula, right eye: Secondary | ICD-10-CM | POA: Diagnosis not present

## 2014-06-02 DIAGNOSIS — H402290 Chronic angle-closure glaucoma, unspecified eye, stage unspecified: Secondary | ICD-10-CM | POA: Diagnosis not present

## 2014-06-02 DIAGNOSIS — E1165 Type 2 diabetes mellitus with hyperglycemia: Secondary | ICD-10-CM | POA: Diagnosis not present

## 2014-06-02 DIAGNOSIS — H35372 Puckering of macula, left eye: Secondary | ICD-10-CM | POA: Diagnosis not present

## 2014-06-23 ENCOUNTER — Ambulatory Visit (INDEPENDENT_AMBULATORY_CARE_PROVIDER_SITE_OTHER): Payer: Medicare Other | Admitting: Internal Medicine

## 2014-06-23 ENCOUNTER — Other Ambulatory Visit (INDEPENDENT_AMBULATORY_CARE_PROVIDER_SITE_OTHER): Payer: Medicare Other

## 2014-06-23 ENCOUNTER — Encounter: Payer: Self-pay | Admitting: Internal Medicine

## 2014-06-23 VITALS — BP 138/72 | HR 78 | Temp 98.0°F | Resp 16 | Ht 60.0 in | Wt 133.0 lb

## 2014-06-23 DIAGNOSIS — N189 Chronic kidney disease, unspecified: Secondary | ICD-10-CM | POA: Diagnosis not present

## 2014-06-23 DIAGNOSIS — I6389 Other cerebral infarction: Secondary | ICD-10-CM

## 2014-06-23 DIAGNOSIS — E785 Hyperlipidemia, unspecified: Secondary | ICD-10-CM

## 2014-06-23 DIAGNOSIS — E1122 Type 2 diabetes mellitus with diabetic chronic kidney disease: Secondary | ICD-10-CM

## 2014-06-23 DIAGNOSIS — I10 Essential (primary) hypertension: Secondary | ICD-10-CM | POA: Diagnosis not present

## 2014-06-23 DIAGNOSIS — I638 Other cerebral infarction: Secondary | ICD-10-CM | POA: Diagnosis not present

## 2014-06-23 DIAGNOSIS — Z23 Encounter for immunization: Secondary | ICD-10-CM

## 2014-06-23 LAB — COMPREHENSIVE METABOLIC PANEL
ALBUMIN: 4.1 g/dL (ref 3.5–5.2)
ALT: 13 U/L (ref 0–35)
AST: 15 U/L (ref 0–37)
Alkaline Phosphatase: 63 U/L (ref 39–117)
BUN: 14 mg/dL (ref 6–23)
CALCIUM: 9.9 mg/dL (ref 8.4–10.5)
CHLORIDE: 103 meq/L (ref 96–112)
CO2: 29 mEq/L (ref 19–32)
Creatinine, Ser: 1.03 mg/dL (ref 0.40–1.20)
GFR: 67.86 mL/min (ref >60.00)
GLUCOSE: 145 mg/dL — AB (ref 70–99)
POTASSIUM: 3.8 meq/L (ref 3.5–5.1)
Sodium: 140 mEq/L (ref 135–145)
Total Bilirubin: 0.4 mg/dL (ref 0.2–1.2)
Total Protein: 7.4 g/dL (ref 6.0–8.3)

## 2014-06-23 LAB — LIPID PANEL
CHOLESTEROL: 188 mg/dL (ref 0–200)
HDL: 46.6 mg/dL (ref >39.00)
LDL Cholesterol: 110 mg/dL — ABNORMAL HIGH (ref 0–99)
NonHDL: 141.4
TRIGLYCERIDES: 156 mg/dL — AB (ref 0.0–149.0)
Total CHOL/HDL Ratio: 4
VLDL: 31.2 mg/dL (ref 0.0–40.0)

## 2014-06-23 LAB — TSH: TSH: 1.77 u[IU]/mL (ref 0.35–4.50)

## 2014-06-23 LAB — MICROALBUMIN / CREATININE URINE RATIO
Creatinine,U: 120.7 mg/dL
MICROALB UR: 1.9 mg/dL (ref 0.0–1.9)
MICROALB/CREAT RATIO: 1.6 mg/g (ref 0.0–30.0)

## 2014-06-23 LAB — HEMOGLOBIN A1C: HEMOGLOBIN A1C: 7.2 % — AB (ref 4.6–6.5)

## 2014-06-23 NOTE — Progress Notes (Signed)
Subjective:  Patient ID: Michelle Lopez, female    DOB: 10-Oct-1945  Age: 69 y.o. MRN: [id number]  CC: Hypertension and Diabetes   HPI Michelle Lopez presents for follow up and she complains that her dizziness, ataxia, and generalized weakness are worsening but she has no new focal neuro complaints, she missed a neuro appt a few months ago.  Outpatient Prescriptions Prior to Visit  Medication Sig Dispense Refill  . aspirin 81 MG tablet Take 81 mg by mouth daily.      Marland Kitchen atorvastatin (LIPITOR) 20 MG tablet Take 1 tablet (20 mg total) by mouth daily. 90 tablet 3  . Blood Glucose Monitoring Suppl (ONE TOUCH BASIC SYSTEM) W/DEVICE KIT Test up to TID dx: 250.42 1 each 1  . Canagliflozin (INVOKANA) 300 MG TABS Take 1 tablet (300 mg total) by mouth daily. 90 tablet 3  . clopidogrel (PLAVIX) 75 MG tablet Take 1 tablet (75 mg total) by mouth daily. 90 tablet 3  . desonide (DESOWEN) 0.05 % lotion Apply topically 2 (two) times daily. 59 mL 2  . Doxepin HCl (SILENOR) 6 MG TABS Take 1 tablet (6 mg total) by mouth at bedtime as needed. 90 tablet 3  . ezetimibe (ZETIA) 10 MG tablet Take 1 tablet (10 mg total) by mouth daily. 90 tablet 3  . glucose blood test strip Test up to TID dx: 250.42 100 each 12  . INVOKANA 300 MG TABS tablet TAKE 1 TABLET BY MOUTH EVERY DAY 90 tablet 2  . ketoconazole (NIZORAL) 2 % cream Apply 1 application topically 2 (two) times daily. 60 g 2  . linagliptin (TRADJENTA) 5 MG TABS tablet Take 1 tablet (5 mg total) by mouth daily. 90 tablet 3  . losartan (COZAAR) 100 MG tablet Take 1 tablet (100 mg total) by mouth daily. 90 tablet 3  . metFORMIN (GLUCOPHAGE) 1000 MG tablet Take 1 tablet (1,000 mg total) by mouth 2 (two) times daily with a meal. 180 tablet 3  . metoprolol tartrate (LOPRESSOR) 25 MG tablet Take 0.5 tablets (12.5 mg total) by mouth 2 (two) times daily. 180 tablet 3  . ONE TOUCH LANCETS MISC Test up to TID dx: 250.42 200 each 11  . PRODIGY TWIST TOP LANCETS 28G MISC USE  TO CHECK BLOOD SUGAR TWICE DAILY 100 each PRN  . tamsulosin (FLOMAX) 0.4 MG CAPS capsule Take 1 capsule (0.4 mg total) by mouth daily. 90 capsule 3  . TRADJENTA 5 MG TABS tablet TAKE 1 TABLET BY MOUTH ONCE DAILY 90 tablet 2   No facility-administered medications prior to visit.    ROS Review of Systems  Constitutional: Negative for fever, chills, diaphoresis, activity change, appetite change, fatigue and unexpected weight change.  HENT: Negative.  Negative for facial swelling, trouble swallowing and voice change.   Eyes: Negative.   Respiratory: Negative.  Negative for cough, choking, chest tightness, shortness of breath and stridor.   Cardiovascular: Negative.  Negative for chest pain, palpitations and leg swelling.  Gastrointestinal: Negative.  Negative for nausea, vomiting, abdominal pain, diarrhea, constipation and blood in stool.  Endocrine: Negative.  Negative for polydipsia, polyphagia and polyuria.  Genitourinary: Negative.   Musculoskeletal: Positive for gait problem. Negative for myalgias, back pain, joint swelling, arthralgias, neck pain and neck stiffness.  Skin: Negative.  Negative for rash.  Allergic/Immunologic: Negative.   Neurological: Positive for dizziness and weakness. Negative for tremors, seizures, syncope, facial asymmetry, speech difficulty, light-headedness, numbness and headaches.  Hematological: Negative.  Negative for adenopathy. Does not bruise/bleed  easily.  Psychiatric/Behavioral: Negative.  Negative for suicidal ideas, hallucinations, confusion, sleep disturbance and decreased concentration. The patient is not nervous/anxious and is not hyperactive.     Objective:  BP 138/72 mmHg  Pulse 78  Temp(Src) 98 F (36.7 C) (Oral)  Resp 16  Ht 5' (1.524 m)  Wt 133 lb (60.328 kg)  BMI 25.97 kg/m2  SpO2 98%  BP Readings from Last 3 Encounters:  06/23/14 138/72  03/03/14 130/80  12/25/13 140/70    Wt Readings from Last 3 Encounters:  06/23/14 133 lb  (60.328 kg)  03/03/14 138 lb (62.596 kg)  12/25/13 128 lb (58.06 kg)    Physical Exam  Constitutional: She appears well-developed and well-nourished.  Non-toxic appearance. She does not have a sickly appearance. She does not appear ill. No distress.  HENT:  Head: Normocephalic and atraumatic.  Mouth/Throat: Oropharynx is clear and moist. No oropharyngeal exudate.  Eyes: Conjunctivae are normal. Right eye exhibits no discharge. Left eye exhibits no discharge. No scleral icterus.  Neck: Normal range of motion. Neck supple. No JVD present. No tracheal deviation present. No thyromegaly present.  Cardiovascular: Normal rate, regular rhythm, normal heart sounds and intact distal pulses.  Exam reveals no gallop and no friction rub.   No murmur heard. Pulmonary/Chest: Effort normal and breath sounds normal. No stridor. No respiratory distress. She has no wheezes. She has no rales. She exhibits no tenderness.  Abdominal: Soft. Bowel sounds are normal. She exhibits no distension and no mass. There is no tenderness. There is no rebound and no guarding.  Musculoskeletal: Normal range of motion. She exhibits no edema or tenderness.  Lymphadenopathy:    She has no cervical adenopathy.  Neurological: She is alert. She has normal strength. She displays no atrophy, no tremor and normal reflexes. No cranial nerve deficit or sensory deficit. She exhibits normal muscle tone. She displays no seizure activity. Coordination and gait abnormal.  Reflex Scores:      Tricep reflexes are 1+ on the right side and 1+ on the left side.      Bicep reflexes are 1+ on the right side and 1+ on the left side.      Brachioradialis reflexes are 1+ on the right side and 1+ on the left side.      Patellar reflexes are 1+ on the right side and 1+ on the left side.      Achilles reflexes are 1+ on the right side and 1+ on the left side. Her neuro exam is non-focal but her ataxia has worsened  Skin: Skin is warm and dry. No rash  noted. She is not diaphoretic. No erythema. No pallor.  Psychiatric: She has a normal mood and affect. Her behavior is normal. Judgment and thought content normal.  Vitals reviewed.   Lab Results  Component Value Date   WBC 8.5 03/19/2013   HGB 12.3 03/19/2013   HCT 38.5 03/19/2013   PLT 246 03/19/2013   GLUCOSE 155* 03/03/2014   CHOL 198 10/31/2013   TRIG 153.0* 10/31/2013   HDL 51.10 10/31/2013   LDLDIRECT 140.3 10/03/2012   LDLCALC 116* 10/31/2013   ALT 9 06/03/2012   AST 11 06/03/2012   NA 139 03/03/2014   K 4.5 03/03/2014   CL 104 03/03/2014   CREATININE 0.95 03/03/2014   BUN 15 03/03/2014   CO2 28 03/03/2014   TSH 1.98 10/31/2013   INR 1.07 04/20/2011   HGBA1C 7.4* 03/03/2014   MICROALBUR 1.95* 02/09/2009    Dg Bone Density  03/24/2014   CLINICAL DATA:  Postmenopausal.  EXAM: DUAL X-RAY ABSORPTIOMETRY (DXA) FOR BONE MINERAL DENSITY  FINDINGS: AP LUMBAR SPINE  Bone Mineral Density (BMD):  0.945 g/cm2  Young Adult T-Score:  -0.9  Z-Score:  0.5  LEFT FEMUR NECK  Bone Mineral Density (BMD):  0.614 g/cm2  Young Adult T-Score: -2.1  Z-Score:  -0.9  ASSESSMENT: Patient's diagnostic category is LOW BONE MASS by WHO Criteria.  FRACTURE RISK: Moderate  FRAX: Based on the Juneau model, the 10 year probability of a major osteoporotic fracture is 5.6%. The 10 year probability of a hip fracture is 1.1%.  COMPARISON: None.  Effective therapies are available in the form of bisphosphonates, selective estrogen receptor modulators, biologic agents, and hormone replacement therapy (for women). All patients should ensure an adequate intake of dietary calcium (1200 mg daily) and vitamin D (800 IU daily) unless contraindicated.  All treatment decisions require clinical judgment and consideration of individual patient factors, including patient preferences, co-morbidities, previous drug use, risk factors not captured in the FRAX model (e.g., frailty, falls, vitamin D deficiency,  increased bone turnover, interval significant decline in bone density) and possible under- or over-estimation of fracture risk by FRAX.  The National Osteoporosis Foundation recommends that FDA-approved medical therapies be considered in postmenopausal women and men age 76 or older with a:  1. Hip or vertebral (clinical or morphometric) fracture.  2. T-score of -2.5 or lower at the spine or hip.  3. Ten-year fracture probability by FRAX of 3% or greater for hip fracture or 20% or greater for major osteoporotic fracture.  People with diagnosed cases of osteoporosis or at high risk for fracture should have regular bone mineral density tests. For patients eligible for Medicare, routine testing is allowed once every 2 years. The testing frequency can be increased to one year for patients who have rapidly progressing disease, those who are receiving or discontinuing medical therapy to restore bone mass, or have additional risk factors.  World Pharmacologist [facility]) Criteria:  Normal: T-scores from +1.0 to -1.0  Low Bone Mass (Osteopenia): T-scores between -1.0 and -2.5  Osteoporosis: T-scores -2.5 and below  Comparison to Reference Population:  T-score is the key measure used in the diagnosis of osteoporosis and relative risk determination for fracture. It provides a value for bone mass relative to the mean bone mass of a young adult reference population expressed in terms of standard deviation (SD).  Z-score is the age-matched score showing the patient's values compared to a population matched for age, sex, and race. This is also expressed in terms of standard deviation. The patient may have values that compare favorably to the age-matched values and still be at increased risk for fracture.   Electronically Signed   By: Claudie Revering M.D.   On: 03/24/2014 12:47    Assessment & Plan:   Michelle Lopez was seen today for hypertension and diabetes.  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  I am having Michelle Lopez maintain her aspirin, ketoconazole, PRODIGY TWIST TOP LANCETS 28G, desonide, metFORMIN, losartan, linagliptin, atorvastatin, canagliflozin, clopidogrel, glucose blood, ONE TOUCH BASIC SYSTEM, ONE TOUCH LANCETS, ezetimibe, TRADJENTA, INVOKANA, Doxepin HCl, tamsulosin, and metoprolol tartrate.  No orders of the defined types were placed in this encounter.     Follow-up: Return in about 4 months (around 10/24/2014).  Scarlette Calico, MD

## 2014-06-23 NOTE — Addendum Note (Signed)
Addended by: Estell Harpin T on: 06/23/2014 03:09 PM   Modules accepted: Orders, SmartSet

## 2014-06-23 NOTE — Patient Instructions (Signed)

## 2014-06-25 ENCOUNTER — Encounter: Payer: Self-pay | Admitting: Internal Medicine

## 2014-07-02 ENCOUNTER — Ambulatory Visit: Payer: Medicare Other | Admitting: Internal Medicine

## 2014-07-03 DIAGNOSIS — H3563 Retinal hemorrhage, bilateral: Secondary | ICD-10-CM | POA: Diagnosis not present

## 2014-07-03 DIAGNOSIS — E11329 Type 2 diabetes mellitus with mild nonproliferative diabetic retinopathy without macular edema: Secondary | ICD-10-CM | POA: Diagnosis not present

## 2014-07-03 DIAGNOSIS — H35371 Puckering of macula, right eye: Secondary | ICD-10-CM | POA: Diagnosis not present

## 2014-07-05 ENCOUNTER — Ambulatory Visit
Admission: RE | Admit: 2014-07-05 | Discharge: 2014-07-05 | Disposition: A | Discharge status: Home / Self Care | Payer: Medicare Other | Source: Ambulatory Visit | Attending: Internal Medicine | Admitting: Internal Medicine

## 2014-07-05 DIAGNOSIS — I639 Cerebral infarction, unspecified: Secondary | ICD-10-CM | POA: Diagnosis not present

## 2014-07-05 DIAGNOSIS — I6502 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: Secondary | ICD-10-CM | POA: Diagnosis not present

## 2014-07-05 DIAGNOSIS — I6389 Other cerebral infarction: Secondary | ICD-10-CM

## 2014-07-13 ENCOUNTER — Telehealth: Payer: Self-pay | Admitting: Internal Medicine

## 2014-07-13 NOTE — Telephone Encounter (Signed)
Please call patients daughter Rosaria Ferries at [phone] regarding referral to neurology. She would like to go to [facility] neurology.

## 2014-07-14 NOTE — Telephone Encounter (Signed)
This is done.

## 2014-08-12 ENCOUNTER — Ambulatory Visit: Payer: Self-pay | Admitting: Neurology

## 2014-09-04 ENCOUNTER — Other Ambulatory Visit: Payer: Self-pay | Admitting: Internal Medicine

## 2014-09-29 ENCOUNTER — Encounter: Payer: Self-pay | Admitting: Internal Medicine

## 2014-09-29 ENCOUNTER — Ambulatory Visit (INDEPENDENT_AMBULATORY_CARE_PROVIDER_SITE_OTHER): Payer: Medicare Other | Admitting: Internal Medicine

## 2014-09-29 VITALS — BP 114/70 | HR 80 | Temp 97.9°F | Ht 60.0 in | Wt 138.0 lb

## 2014-09-29 DIAGNOSIS — I638 Other cerebral infarction: Secondary | ICD-10-CM | POA: Diagnosis not present

## 2014-09-29 DIAGNOSIS — M25369 Other instability, unspecified knee: Secondary | ICD-10-CM

## 2014-09-29 DIAGNOSIS — I10 Essential (primary) hypertension: Secondary | ICD-10-CM | POA: Diagnosis not present

## 2014-09-29 DIAGNOSIS — I6389 Other cerebral infarction: Secondary | ICD-10-CM

## 2014-09-29 NOTE — Assessment & Plan Note (Signed)
With tendency to fall but no falls to date; has evidence for bilat DJD, ? Left effusion, but cannot r/o ligament issue as well - for sport medicine referral

## 2014-09-29 NOTE — Patient Instructions (Signed)
Please continue all other medications as before, and refills have been done if requested.  Please have the pharmacy call with any other refills you may need.  Please continue your efforts at being more active, low cholesterol diet, and weight control.  You are otherwise up to date with prevention measures today.  Please keep your appointments with your specialists as you may have planned  You will be contacted regarding the referral for: Physical Therapy, and Dr Tamala Julian (sports medicine)

## 2014-09-29 NOTE — Progress Notes (Signed)
Subjective:    Patient ID: Michelle Lopez, female    DOB: [birth year], 69 y.o.   MRN: [id number]  HPI  Here with bilat knee buckling worse in the past 3 mo, is s/p cva with lfet sided residual, right handed, but desribes one then the other at different times with tend to try to simply buckle with standing or walking; used to walk with walker, but now uses cane only for support.  No actual falls. Last PT approx 1 yr ago.  No knee pain or swelling.  Past Medical History  Diagnosis Date  . Hyperlipidemia   . Diabetes mellitus     type 2, uncontrolled  . Essential hypertension, benign   . Cocaine abuse, unspecified   . Diarrhea   . Cerebrovascular disease, unspecified   . Stroke     x7   Past Surgical History  Procedure Laterality Date  . Total hip arthroplasty  1970    Dr. Rodell Perna    reports that she has never smoked. She has never used smokeless tobacco. She reports that she does not drink alcohol or use illicit drugs. family history is negative for Colon cancer. Allergies  Allergen Reactions  . Crestor [Rosuvastatin Calcium]     myalgias  . Morphine Hives  . Pravastatin Sodium Rash  . Simvastatin Rash   Current Outpatient Prescriptions on File Prior to Visit  Medication Sig Dispense Refill  . aspirin 81 MG tablet Take 81 mg by mouth daily.      Marland Kitchen atorvastatin (LIPITOR) 20 MG tablet Take 1 tablet (20 mg total) by mouth daily. 90 tablet 3  . Blood Glucose Monitoring Suppl (ONE TOUCH BASIC SYSTEM) W/DEVICE KIT Test up to TID dx: 250.42 1 each 1  . Canagliflozin (INVOKANA) 300 MG TABS Take 1 tablet (300 mg total) by mouth daily. 90 tablet 3  . clopidogrel (PLAVIX) 75 MG tablet Take 1 tablet (75 mg total) by mouth daily. 90 tablet 3  . desonide (DESOWEN) 0.05 % lotion Apply topically 2 (two) times daily. 59 mL 2  . Doxepin HCl (SILENOR) 6 MG TABS Take 1 tablet (6 mg total) by mouth at bedtime as needed. 90 tablet 3  . ezetimibe (ZETIA) 10 MG tablet Take 1 tablet (10 mg total) by  mouth daily. 90 tablet 3  . glucose blood test strip Test up to TID dx: 250.42 100 each 12  . INVOKANA 300 MG TABS tablet TAKE 1 TABLET BY MOUTH EVERY DAY 90 tablet 1  . ketoconazole (NIZORAL) 2 % cream Apply 1 application topically 2 (two) times daily. 60 g 2  . linagliptin (TRADJENTA) 5 MG TABS tablet Take 1 tablet (5 mg total) by mouth daily. 90 tablet 3  . losartan (COZAAR) 100 MG tablet Take 1 tablet (100 mg total) by mouth daily. 90 tablet 3  . losartan (COZAAR) 100 MG tablet TAKE 1 TABLET BY MOUTH ONCE DAILY 90 tablet 1  . metFORMIN (GLUCOPHAGE) 1000 MG tablet Take 1 tablet (1,000 mg total) by mouth 2 (two) times daily with a meal. 180 tablet 3  . metFORMIN (GLUCOPHAGE) 1000 MG tablet TAKE 1 TABLET BY MOUTH TWICE DAILY WITH A MEAL 180 tablet 1  . metoprolol tartrate (LOPRESSOR) 25 MG tablet Take 0.5 tablets (12.5 mg total) by mouth 2 (two) times daily. 180 tablet 3  . ONE TOUCH LANCETS MISC Test up to TID dx: 250.42 200 each 11  . PRODIGY TWIST TOP LANCETS 28G MISC USE TO CHECK BLOOD SUGAR TWICE DAILY 100  each PRN  . tamsulosin (FLOMAX) 0.4 MG CAPS capsule Take 1 capsule (0.4 mg total) by mouth daily. 90 capsule 3  . tamsulosin (FLOMAX) 0.4 MG CAPS capsule TAKE 1 CAPSULE BY MOUTH EVERY DAY 90 capsule 1  . TRADJENTA 5 MG TABS tablet TAKE 1 TABLET BY MOUTH ONCE DAILY 90 tablet 1  . [DISCONTINUED] glipiZIDE (GLUCOTROL) 5 MG tablet Take 5 mg by mouth 2 (two) times daily before a meal.     No current facility-administered medications on file prior to visit.   Review of Systems  Constitutional: Negative for unusual diaphoresis or night sweats HENT: Negative for ringing in ear or discharge Eyes: Negative for double vision or worsening visual disturbance.  Respiratory: Negative for choking and stridor.   Gastrointestinal: Negative for vomiting or other signifcant bowel change Genitourinary: Negative for hematuria or change in urine volume.  Musculoskeletal: Negative for other MSK pain or  swelling Skin: Negative for color change and worsening wound.  Neurological: Negative for tremors and numbness other than noted  Psychiatric/Behavioral: Negative for decreased concentration or agitation other than above       Objective:   Physical Exam BP 114/70 mmHg  Pulse 80  Temp(Src) 97.9 F (36.6 C) (Oral)  Ht 5' (1.524 m)  Wt 138 lb (62.596 kg)  BMI 26.95 kg/m2  SpO2 96% VS noted, not ill appearing Constitutional: Pt appears in no significant distress HENT: Head: NCAT.  Right Ear: External ear normal.  Left Ear: External ear normal.  Eyes: . Pupils are equal, round, and reactive to light. Conjunctivae and EOM are normal Neck: Normal range of motion. Neck supple.  Cardiovascular: Normal rate and regular rhythm.   Pulmonary/Chest: Effort normal and breath sounds without rales or wheezing.  Spine nontender Bilat hips nontender over greater trochanters Right knee with crepitus but FROM, NT, no effusion Left knee without crepitus but ? Small effusion, and reduced ROM to full extension by approx 10 degress Neurological: Pt is alert. Not confused , motor 5/5 rightLE, 4/5 LLE Skin: Skin is warm. No rash, no LE edema Psychiatric: Pt behavior is normal. No agitation.     Assessment & Plan:

## 2014-09-29 NOTE — Assessment & Plan Note (Signed)
Exam stable, but appears to need repeat outpatient PT - will refer

## 2014-09-29 NOTE — Assessment & Plan Note (Signed)
stable overall by history and exam, recent data reviewed with pt, and pt to continue medical treatment as before,  to f/u any worsening symptoms or concerns BP Readings from Last 3 Encounters:  09/29/14 114/70  06/23/14 138/72  03/03/14 130/80

## 2014-09-29 NOTE — Progress Notes (Signed)
Pre visit review using our clinic review tool, if applicable. No additional management support is needed unless otherwise documented below in the visit note. 

## 2014-10-19 ENCOUNTER — Ambulatory Visit: Payer: Medicare Other | Admitting: Internal Medicine

## 2014-10-20 ENCOUNTER — Telehealth: Payer: Self-pay | Admitting: Internal Medicine

## 2014-10-20 NOTE — Telephone Encounter (Signed)
Patient no showed for 4 month fu on 9/12.  Please advise.

## 2014-10-21 NOTE — Telephone Encounter (Signed)
Please call to reschedule.

## 2014-10-29 NOTE — Telephone Encounter (Signed)
Got scheduled  °

## 2014-11-02 ENCOUNTER — Ambulatory Visit (INDEPENDENT_AMBULATORY_CARE_PROVIDER_SITE_OTHER)
Admission: RE | Admit: 2014-11-02 | Discharge: 2014-11-02 | Disposition: A | Discharge status: Home / Self Care | Payer: Medicare Other | Source: Ambulatory Visit | Attending: Family Medicine | Admitting: Family Medicine

## 2014-11-02 ENCOUNTER — Other Ambulatory Visit (INDEPENDENT_AMBULATORY_CARE_PROVIDER_SITE_OTHER): Payer: Medicare Other

## 2014-11-02 ENCOUNTER — Encounter: Payer: Self-pay | Admitting: Family Medicine

## 2014-11-02 ENCOUNTER — Ambulatory Visit (INDEPENDENT_AMBULATORY_CARE_PROVIDER_SITE_OTHER): Payer: Medicare Other | Admitting: Family Medicine

## 2014-11-02 VITALS — BP 138/82 | HR 68 | Ht 60.0 in | Wt 134.0 lb

## 2014-11-02 DIAGNOSIS — M25562 Pain in left knee: Secondary | ICD-10-CM | POA: Diagnosis not present

## 2014-11-02 DIAGNOSIS — M25569 Pain in unspecified knee: Secondary | ICD-10-CM

## 2014-11-02 DIAGNOSIS — M25362 Other instability, left knee: Secondary | ICD-10-CM

## 2014-11-02 DIAGNOSIS — M1612 Unilateral primary osteoarthritis, left hip: Secondary | ICD-10-CM | POA: Diagnosis not present

## 2014-11-02 DIAGNOSIS — M1611 Unilateral primary osteoarthritis, right hip: Secondary | ICD-10-CM | POA: Diagnosis not present

## 2014-11-02 MED ORDER — VITAMIN D (ERGOCALCIFEROL) 1.25 MG (50000 UNIT) PO CAPS: 50000.0000 [IU] | ORAL_CAPSULE | ORAL | Status: DC

## 2014-11-02 NOTE — Progress Notes (Signed)
[facility] [address] [place], [place] [address] Phone: [phone] Subjective:    I'm seeing this patient by the request  of:  Scarlette Calico, MD   CC: Left knee pain  [id number] Michelle Lopez is a 69 y.o. female coming in with complaint of left knee pain. Patient has had more of an instability she states. States when she walks sometimes she feels like her knee is going to give out. Has been walking with a 8 of a cane. Patient did have history of strokes and does have some mild left lower extremity weakness. Patient continues to try to be active. Finds more difficult because she becomes more concerned about walking that in her knee will give out on her. Rates the pain as 2 out of 10 but the anxiety she feels about the knee is 8 out of 10. Patient has noticed she's been doing less activity secondary to the instability. Has not given out on her yet.  Past Medical History  Diagnosis Date  . Hyperlipidemia   . Diabetes mellitus     type 2, uncontrolled  . Essential hypertension, benign   . Cocaine abuse, unspecified   . Diarrhea   . Cerebrovascular disease, unspecified   . Stroke     x7   Past Surgical History  Procedure Laterality Date  . Total hip arthroplasty  1970    Dr. Rodell Perna   Social History   Social History  . Marital Status: Divorced    Spouse Name: N/A  . Number of Children: 4  . Years of Education: N/A   Occupational History  . Disability    Social History Main Topics  . Smoking status: Never Smoker   . Smokeless tobacco: Never Used  . Alcohol Use: No  . Drug Use: No  . Sexual Activity: Not Currently   Other Topics Concern  . Not on file   Social History Narrative   Daily caffeine    Allergies  Allergen Reactions  . Crestor [Rosuvastatin Calcium]     myalgias  . Morphine Hives  . Pravastatin Sodium Rash  . Simvastatin Rash   Family History  Problem Relation Age of Onset  . Colon cancer Neg Hx          Past medical history, social, surgical and family history all reviewed in electronic medical record.   Review of Systems: No headache, visual changes, nausea, vomiting, diarrhea, constipation, dizziness, abdominal pain, skin rash, fevers, chills, night sweats, weight loss, swollen lymph nodes, body aches, joint swelling, muscle aches, chest pain, shortness of breath, mood changes.   Objective Blood pressure 138/82, pulse 68, height 5' (1.524 m), weight 134 lb (60.782 kg), SpO2 96 %.  General: No apparent distress alert and oriented x3 mood and affect normal, dressed appropriately.  HEENT: Pupils equal, extraocular movements intact  Respiratory: Patient's speak in full sentences and does not appear short of breath  Cardiovascular: No lower extremity edema, non tender, no erythema  Skin: Warm dry intact with no signs of infection or rash on extremities or on axial skeleton.  Abdomen: Soft nontender  Neuro: Cranial nerves II through XII are intact, neurovascularly intact in all extremities with 2+ DTRs and 2+ pulses.  Lymph: No lymphadenopathy of posterior or anterior cervical chain or axillae bilaterally.  Gait normal with good balance and coordination.  MSK:  Non tender with full range of motion and good stability and symmetric strength and tone of shoulders, elbows, wrist, hip and ankles  bilaterally.  Knee: Left Atrophy of the VMO compared to contralateral side Palpation normal with no warmth, joint line tenderness, patellar tenderness, or condyle tenderness. ROM full in flexion and extension and lower leg rotation. Mild laxity secondary to the loss of muscle strength Negative Mcmurray's, Apley's, and Thessalonian tests. Non painful patellar compression. Patellar glide with mild crepitus. Patellar and quadriceps tendons unremarkable. Hamstring and quadriceps strength is 4 out of 5 compared to the contralateral side   MSK US performed of: Left This study was ordered, performed, and  interpreted by Charlann Boxer D.O.  Knee: All structures visualized. Anteromedial, anterolateral, posteromedial, and posterolateral menisci unremarkable without tearing, fraying, effusion, or displacement. Moderate narrowing of the medial joint line Patellar Tendon unremarkable on long and transverse views without effusion. No abnormality of prepatellar bursa. LCL and MCL unremarkable on long and transverse views. No abnormality of origin of medial or lateral head of the gastrocnemius.  IMPRESSION:  Mild to moderate arthritis  Procedure note [id number]; 15 minutes spent for Therapeutic exercises as stated in above notes.  This included exercises focusing on stretching, strengthening, with significant focus on eccentric aspects. Vastus medialis oblique exercises as well as eccentric strengthening of the hamstrings. Balance and coordination noted.  Proper technique shown and discussed handout in great detail with ATC.  All questions were discussed and answered.      Impression and Recommendations:     This case required medical decision making of moderate complexity.

## 2014-11-02 NOTE — Patient Instructions (Addendum)
Good to see you.  Ice 20 minutes 2 times daily. Usually after activity and before bed. Exercises 3 times a week.  We will get xrays downstairs today.  Vitamin D 50000 IU weekly Turmeric 500mg  once daily  Good shoes with rigid bottom.  Jalene Mullet, Merrell or New balance greater then 700 See me agai nin 4-6 weeks.   Consider Allyn Kenner  Address: [address], [place], [place] [address] Phone: [phone]

## 2014-11-02 NOTE — Progress Notes (Signed)
Pre visit review using our clinic review tool, if applicable. No additional management support is needed unless otherwise documented below in the visit note. 

## 2014-11-02 NOTE — Assessment & Plan Note (Signed)
Likely secondary to weakness from CVA. Patient has some mild to moderate osteophytic changes on ultrasound and we will get x-rays to further evaluate. We discussed the possibility of bracing due to the weakness but patient declined. We discussed icing regimen as well as home exercises. Patient work with Product/process development scientist. Patient will also try to get new shoes and I think will be more beneficial and help with stability. Patient will do vitamin D supplementation for muscle strength and muscle endurance as well as patient's underlying osteopenia. Discuss proper diet. Patient come back and see me again in 4-6 weeks.

## 2014-11-04 ENCOUNTER — Ambulatory Visit: Payer: Self-pay | Admitting: Internal Medicine

## 2014-11-23 ENCOUNTER — Encounter: Payer: Self-pay | Admitting: *Deleted

## 2014-11-30 ENCOUNTER — Other Ambulatory Visit: Payer: Self-pay | Admitting: Family Medicine

## 2014-11-30 NOTE — Telephone Encounter (Signed)
Refill done.  

## 2014-12-08 ENCOUNTER — Encounter: Payer: Self-pay | Admitting: Family Medicine

## 2014-12-08 ENCOUNTER — Ambulatory Visit (INDEPENDENT_AMBULATORY_CARE_PROVIDER_SITE_OTHER): Payer: Medicare Other | Admitting: Family Medicine

## 2014-12-08 VITALS — BP 130/78 | HR 80 | Ht 60.0 in | Wt 137.0 lb

## 2014-12-08 DIAGNOSIS — M25362 Other instability, left knee: Secondary | ICD-10-CM | POA: Diagnosis not present

## 2014-12-08 DIAGNOSIS — M25562 Pain in left knee: Secondary | ICD-10-CM | POA: Diagnosis not present

## 2014-12-08 NOTE — Assessment & Plan Note (Addendum)
Continues to be a difficulty secondary to the atrophy of the musculature, weakness poststroke, and underlying arthritis. Patient will get a custom brace for more stability, go to formal physical therapy, encourage her to take Tylenol on a regular basis but avoid any anti-inflammatory secondary to her being on a blood thinner. Patient and will come back and see me again in 6 weeks.discussed the importance of staying active. If patient has any worsening symptoms we can consider injection in the knee. Patient declined that today.  Spent  25 minutes with patient face-to-face and had greater than 50% of counseling including as described above in assessment and plan.

## 2014-12-08 NOTE — Progress Notes (Signed)
Pre visit review using our clinic review tool, if applicable. No additional management support is needed unless otherwise documented below in the visit note. 

## 2014-12-08 NOTE — Patient Instructions (Signed)
Good to see you Ice when you need it.  Tylenol 325mg  3 times daily Continue the vitamin D weekly Physical therapy will be calling you We will get you a brace See me again in 6 weeks or sooner if  You want an injection.

## 2014-12-08 NOTE — Progress Notes (Signed)
[facility] [place], [place] [address] Phone: [phone] Subjective:     CC: Left knee pain follow up  [id number] Michelle Lopez is a 69 y.o. female coming in with complaint of left knee pain.patient was found to have moderate arthritis of the knee. Patient elected to try conservative therapy including home exercises, icing protocol, as well as over-the-counter medications. Patient states that she has not done any of this. Continues to have instability of the left knee. Walks with the aid of a cane. Having difficult he finding the motivation to do the exercises well. States that she still feels uncomfortable walking long distances secondary to the weakness of the knee.  X-rays were taken at last exam showing moderate arthritis.  Past Medical History  Diagnosis Date  . Hyperlipidemia   . Diabetes mellitus     type 2, uncontrolled  . Essential hypertension, benign   . Cocaine abuse, unspecified   . Diarrhea   . Cerebrovascular disease, unspecified   . Stroke [facility])     x7   Past Surgical History  Procedure Laterality Date  . Total hip arthroplasty  1970    Dr. Rodell Perna   Social History   Social History  . Marital Status: Divorced    Spouse Name: N/A  . Number of Children: 4  . Years of Education: N/A   Occupational History  . Disability    Social History Main Topics  . Smoking status: Never Smoker   . Smokeless tobacco: Never Used  . Alcohol Use: No  . Drug Use: No  . Sexual Activity: Not Currently   Other Topics Concern  . Not on file   Social History Narrative   Daily caffeine    Allergies  Allergen Reactions  . Crestor [Rosuvastatin Calcium]     myalgias  . Morphine Hives  . Pravastatin Sodium Rash  . Simvastatin Rash   Family History  Problem Relation Age of Onset  . Colon cancer Neg Hx         Past medical history, social, surgical and family history all reviewed in electronic medical record.    Review of Systems: No headache, visual changes, nausea, vomiting, diarrhea, constipation, dizziness, abdominal pain, skin rash, fevers, chills, night sweats, weight loss, swollen lymph nodes, body aches, joint swelling, muscle aches, chest pain, shortness of breath, mood changes.   Objective Blood pressure 130/78, pulse 80, SpO2 95 %.  General: No apparent distress alert and oriented x3 mood and affect normal, dressed appropriately.  HEENT: Pupils equal, extraocular movements intact  Respiratory: Patient's speak in full sentences and does not appear short of breath  Cardiovascular: No lower extremity edema, non tender, no erythema  Skin: Warm dry intact with no signs of infection or rash on extremities or on axial skeleton.  Abdomen: Soft nontender  Neuro: Cranial nerves II through XII are intact, neurovascularly intact in all extremities with 2+ DTRs and 2+ pulses.  Lymph: No lymphadenopathy of posterior or anterior cervical chain or axillae bilaterally.  Gait normal with good balance and coordination.  MSK:  Non tender with full range of motion and good stability and symmetric strength and tone of shoulders, elbows, wrist, hip and ankles bilaterally.  Knee: Left Atrophy of the VMO compared to contralateral side Mild discomfort over the medial and lateral joint lines ROM full in flexion and extension and lower leg rotation. Mild laxity secondary to the loss of muscle strengthgets some instability. Negative Mcmurray's, Apley's, and Thessalonian  tests. Non painful patellar compression. Patellar glide with mild crepitus. Patellar and quadriceps tendons unremarkable. Hamstring and quadriceps strength is 4 out of 5 compared to the contralateral side Contralateral side and mild weakness but not as bad as the left side.      Impression and Recommendations:     This case required medical decision making of moderate complexity.

## 2014-12-14 ENCOUNTER — Ambulatory Visit (INDEPENDENT_AMBULATORY_CARE_PROVIDER_SITE_OTHER): Payer: Medicare Other | Admitting: Internal Medicine

## 2014-12-14 ENCOUNTER — Encounter: Payer: Self-pay | Admitting: Internal Medicine

## 2014-12-14 VITALS — BP 128/82 | HR 78 | Temp 98.1°F | Resp 18 | Ht 60.0 in | Wt 141.8 lb

## 2014-12-14 DIAGNOSIS — T7840XA Allergy, unspecified, initial encounter: Secondary | ICD-10-CM

## 2014-12-14 DIAGNOSIS — Z23 Encounter for immunization: Secondary | ICD-10-CM | POA: Diagnosis not present

## 2014-12-14 MED ORDER — CETIRIZINE HCL 10 MG PO TABS: 10.0000 mg | ORAL_TABLET | Freq: Two times a day (BID) | ORAL | Status: DC

## 2014-12-14 MED ORDER — CETIRIZINE HCL 10 MG PO TABS: 10.0000 mg | ORAL_TABLET | Freq: Every day | ORAL | Status: DC

## 2014-12-14 NOTE — Progress Notes (Signed)
Pre visit review using our clinic review tool, if applicable. No additional management support is needed unless otherwise documented below in the visit note. 

## 2014-12-14 NOTE — Patient Instructions (Signed)
We have sent in a prescription for cetirizine (zyrtec) for the swelling and allergic reaction. Take 1 pill twice a day for the next 1-2 weeks.   The skin on the scalp has a mild chemical burn or allergic reaction and you should only use mild soap and water on it and avoid hair products on the area for several weeks.   If you get more tongue swelling or face swelling or have problems breathing come to the emergency room immediately.

## 2014-12-15 ENCOUNTER — Telehealth: Payer: Self-pay | Admitting: Internal Medicine

## 2014-12-15 ENCOUNTER — Ambulatory Visit (INDEPENDENT_AMBULATORY_CARE_PROVIDER_SITE_OTHER): Payer: Medicare Other | Admitting: Family

## 2014-12-15 ENCOUNTER — Encounter: Payer: Self-pay | Admitting: Family

## 2014-12-15 VITALS — BP 140/78 | HR 80 | Temp 98.0°F | Resp 18 | Ht 60.0 in | Wt 141.0 lb

## 2014-12-15 DIAGNOSIS — T7840XA Allergy, unspecified, initial encounter: Secondary | ICD-10-CM | POA: Insufficient documentation

## 2014-12-15 DIAGNOSIS — T7840XD Allergy, unspecified, subsequent encounter: Secondary | ICD-10-CM

## 2014-12-15 MED ORDER — PREDNISONE 10 MG (21) PO TBPK: ORAL_TABLET | ORAL | Status: DC

## 2014-12-15 MED ORDER — METHYLPREDNISOLONE ACETATE 80 MG/ML IJ SUSP
80.0000 mg | Freq: Once | INTRAMUSCULAR | Status: AC
  Administered 2014-12-15: 80 mg via INTRAMUSCULAR

## 2014-12-15 NOTE — Telephone Encounter (Signed)
Pt seeing Terri Piedra @ 3:15...[name]

## 2014-12-15 NOTE — Telephone Encounter (Signed)
Patient Name: BURDELLA ROWINSKI DOB: 08-Jul-1945 Initial Comment caller states mother has rash on back on her neck and head, her face is swollen Nurse Assessment Nurse: Marcelline Deist, RN, Kermit Balo Date/Time (Eastern Time): 12/15/2014 12:04:41 PM Confirm and document reason for call. If symptomatic, describe symptoms. ---Caller states mother has red rash on back on her neck and head that is itchy. Her face is swollen around the eyes, almost swollen shut. Was seen yesterday for swollen tongue. No fever. Has the patient traveled out of the country within the last 30 days? ---Not Applicable Does the patient have any new or worsening symptoms? ---Yes Will a triage be completed? ---Yes Related visit to physician within the last 2 weeks? ---Yes Does the PT have any chronic conditions? (i.e. diabetes, asthma, etc.) ---Yes List chronic conditions. ---diabetes, on Metformin, high BP, on Lipitor, Flomax Guidelines Guideline Title Affirmed Question Affirmed Notes Eye - Swelling [1] SEVERE eyelid swelling (i.e., shut or almost) AND [2] involves both eyes (Exception: itchy eyes, which are probably an allergic reaction) Final Disposition User See Physician within 4 Hours (or PCP triage) Marcelline Deist, RN, Lynda Comments Caller states her mother had her hair done on Friday and seemed to be sensitive to a gel they used. The rash occurred within a couple days of having her hair done. Caller states they made an appt. today for her mother to be seen. Referrals REFERRED TO PCP OFFICE Disagree/Comply: Comply

## 2014-12-15 NOTE — Progress Notes (Signed)
Pre visit review using our clinic review tool, if applicable. No additional management support is needed unless otherwise documented below in the visit note. 

## 2014-12-15 NOTE — Assessment & Plan Note (Signed)
Likely the allergic reaction was to the hair treatment. She has clear rash over the area exposed. Not clearly related to the tongue symptoms. Will rx cetirizine 10 mg BID for 1-2 weeks. Avoid that treatment in the future and use gentle soap and water on the scalp only. No scratching. Seek emergent care for tongue swelling, face swelling, SOB, problems swallowing.

## 2014-12-15 NOTE — Patient Instructions (Addendum)
Thank you for choosing [facility].  Summary/Instructions:  Your prescription(s) have been submitted to your pharmacy or been printed and provided for you. Please take as directed and contact our office if you believe you are having problem(s) with the medication(s) or have any questions.  If your symptoms worsen or fail to improve, please contact our office for further instruction, or in case of emergency go directly to the emergency room at the closest medical facility.   6 Day Prednisone Taper Instructions:   Day 1: Two tablets before breakfast, one after lunch, one after dinner, and two at bedtime.  Day 2: One tablet before breakfast, one after lunch, one after dinner, and two at bedtime Day 3: One tablet before breakfast, one after lunch, one after dinner, and one at bedtime Day 4: One tablet before breakfast, one after lunch, and one at bedtime Day 5: One tablet before breakfast and one at bedtime Day 6: One tablet before breakfast  Contact Dermatitis Dermatitis is redness, soreness, and swelling (inflammation) of the skin. Contact dermatitis is a reaction to certain substances that touch the skin. There are two types of contact dermatitis:   Irritant contact dermatitis. This type is caused by something that irritates your skin, such as dry hands from washing them too much. This type does not require previous exposure to the substance for a reaction to occur. This type is more common.  Allergic contact dermatitis. This type is caused by a substance that you are allergic to, such as a nickel allergy or poison ivy. This type only occurs if you have been exposed to the substance (allergen) before. Upon a repeat exposure, your body reacts to the substance. This type is less common. CAUSES  Many different substances can cause contact dermatitis. Irritant contact dermatitis is most commonly caused by exposure to:   Makeup.   Soaps.   Detergents.   Bleaches.   Acids.    Metal salts, such as nickel.  Allergic contact dermatitis is most commonly caused by exposure to:   Poisonous plants.   Chemicals.   Jewelry.   Latex.   Medicines.   Preservatives in products, such as clothing.  RISK FACTORS This condition is more likely to develop in:   People who have jobs that expose them to irritants or allergens.  People who have certain medical conditions, such as asthma or eczema.  SYMPTOMS  Symptoms of this condition may occur anywhere on your body where the irritant has touched you or is touched by you. Symptoms include:  Dryness or flaking.   Redness.   Cracks.   Itching.   Pain or a burning feeling.   Blisters.  Drainage of small amounts of blood or clear fluid from skin cracks. With allergic contact dermatitis, there may also be swelling in areas such as the eyelids, mouth, or genitals.  DIAGNOSIS  This condition is diagnosed with a medical history and physical exam. A patch skin test may be performed to help determine the cause. If the condition is related to your job, you may need to see an occupational medicine specialist. TREATMENT Treatment for this condition includes figuring out what caused the reaction and protecting your skin from further contact. Treatment may also include:   Steroid creams or ointments. Oral steroid medicines may be needed in more severe cases.  Antibiotics or antibacterial ointments, if a skin infection is present.  Antihistamine lotion or an antihistamine taken by mouth to ease itching.  A bandage (dressing). HOME CARE INSTRUCTIONS Skin  Care  Moisturize your skin as needed.   Apply cool compresses to the affected areas.  Try taking a bath with:  Epsom salts. Follow the instructions on the packaging. You can get these at your local pharmacy or grocery store.  Baking soda. Pour a small amount into the bath as directed by your health care provider.  Colloidal oatmeal. Follow the  instructions on the packaging. You can get this at your local pharmacy or grocery store.  Try applying baking soda paste to your skin. Stir water into baking soda until it reaches a paste-like consistency.  Do not scratch your skin.  Bathe less frequently, such as every other day.  Bathe in lukewarm water. Avoid using hot water. Medicines  Take or apply over-the-counter and prescription medicines only as told by your health care provider.   If you were prescribed an antibiotic medicine, take or apply your antibiotic as told by your health care provider. Do not stop using the antibiotic even if your condition starts to improve. General Instructions  Keep all follow-up visits as told by your health care provider. This is important.  Avoid the substance that caused your reaction. If you do not know what caused it, keep a journal to try to track what caused it. Write down:  What you eat.  What cosmetic products you use.  What you drink.  What you wear in the affected area. This includes jewelry.  If you were given a dressing, take care of it as told by your health care provider. This includes when to change and remove it. SEEK MEDICAL CARE IF:   Your condition does not improve with treatment.  Your condition gets worse.  You have signs of infection such as swelling, tenderness, redness, soreness, or warmth in the affected area.  You have a fever.  You have new symptoms. SEEK IMMEDIATE MEDICAL CARE IF:   You have a severe headache, neck pain, or neck stiffness.  You vomit.  You feel very sleepy.  You notice red streaks coming from the affected area.  Your bone or joint underneath the affected area becomes painful after the skin has healed.  The affected area turns darker.  You have difficulty breathing.   This information is not intended to replace advice given to you by your health care provider. Make sure you discuss any questions you have with your health care  provider.   Document Released: 01/21/2000 Document Revised: 10/14/2014 Document Reviewed: 06/10/2014 Elsevier Interactive Patient Education [employer].

## 2014-12-15 NOTE — Telephone Encounter (Signed)
States she brought patient in yesterday because tung was swollen.  She states that this morning her eyes, face, neck and back of head is swollen.  She is requesting an emergency referral for a dermatologist.

## 2014-12-15 NOTE — Assessment & Plan Note (Signed)
Continues to experience symptoms of reaction with no anaphylaxis. Has not started the Zyrtec. In office Depomedrol provided. Written prescription for prednisone taper if needed. Instructed that blood sugars may rise with prednisone. Follow up if symptoms worsen or fail to improve.

## 2014-12-15 NOTE — Progress Notes (Signed)
Subjective:    Patient ID: Michelle Lopez, female    DOB: [birth year], 69 y.o.   MRN: [id number]  Chief Complaint  Patient presents with  . Facial Swelling    has swelling under eyes and was here yesterday for swelling of the tongue, has a rash on the back of her neck and in her hair, itching and red    HPI:  Michelle Lopez is a 69 y.o. female who  has a past medical history of Hyperlipidemia; Diabetes mellitus; Essential hypertension, benign; Cocaine abuse, unspecified; Diarrhea; Cerebrovascular disease, unspecified; and Stroke ([place]). and presents today for an acute office visit.  Recently seen in the office for an allergic reaction most likely related to a hair gel that was used during a haircut. She was prescribed cetirizine instructed to follow-up if symptoms worsened or fail to improve. Since that time she continues to experience associated symptoms of swelling under her eyes, and a rash on her back and neck. It is described as red and itchy. Denies any shortness of breath and her tongue swelling has decreased slightly. She has not started taking the Cetirizine secondary to pharmacy availability.    Allergies  Allergen Reactions  . Crestor [Rosuvastatin Calcium]     myalgias  . Morphine Hives  . Pravastatin Sodium Rash  . Simvastatin Rash     Current Outpatient Prescriptions on File Prior to Visit  Medication Sig Dispense Refill  . aspirin 81 MG tablet Take 81 mg by mouth daily.      Marland Kitchen atorvastatin (LIPITOR) 20 MG tablet Take 1 tablet (20 mg total) by mouth daily. 90 tablet 3  . Blood Glucose Monitoring Suppl (ONE TOUCH BASIC SYSTEM) W/DEVICE KIT Test up to TID dx: 250.42 1 each 1  . Canagliflozin (INVOKANA) 300 MG TABS Take 1 tablet (300 mg total) by mouth daily. 90 tablet 3  . cetirizine (ZYRTEC) 10 MG tablet Take 1 tablet (10 mg total) by mouth 2 (two) times daily. 30 tablet 0  . clopidogrel (PLAVIX) 75 MG tablet Take 1 tablet (75 mg total) by mouth daily. 90 tablet 3  .  desonide (DESOWEN) 0.05 % lotion Apply topically 2 (two) times daily. 59 mL 2  . Doxepin HCl (SILENOR) 6 MG TABS Take 1 tablet (6 mg total) by mouth at bedtime as needed. 90 tablet 3  . ezetimibe (ZETIA) 10 MG tablet Take 1 tablet (10 mg total) by mouth daily. 90 tablet 3  . glucose blood test strip Test up to TID dx: 250.42 100 each 12  . INVOKANA 300 MG TABS tablet TAKE 1 TABLET BY MOUTH EVERY DAY 90 tablet 1  . ketoconazole (NIZORAL) 2 % cream Apply 1 application topically 2 (two) times daily. 60 g 2  . linagliptin (TRADJENTA) 5 MG TABS tablet Take 1 tablet (5 mg total) by mouth daily. 90 tablet 3  . losartan (COZAAR) 100 MG tablet Take 1 tablet (100 mg total) by mouth daily. 90 tablet 3  . losartan (COZAAR) 100 MG tablet TAKE 1 TABLET BY MOUTH ONCE DAILY 90 tablet 1  . metFORMIN (GLUCOPHAGE) 1000 MG tablet Take 1 tablet (1,000 mg total) by mouth 2 (two) times daily with a meal. 180 tablet 3  . metFORMIN (GLUCOPHAGE) 1000 MG tablet TAKE 1 TABLET BY MOUTH TWICE DAILY WITH A MEAL 180 tablet 1  . metoprolol tartrate (LOPRESSOR) 25 MG tablet Take 0.5 tablets (12.5 mg total) by mouth 2 (two) times daily. 180 tablet 3  . ONE TOUCH LANCETS MISC Test  up to TID dx: 250.42 200 each 11  . PRODIGY TWIST TOP LANCETS 28G MISC USE TO CHECK BLOOD SUGAR TWICE DAILY 100 each PRN  . tamsulosin (FLOMAX) 0.4 MG CAPS capsule Take 1 capsule (0.4 mg total) by mouth daily. 90 capsule 3  . tamsulosin (FLOMAX) 0.4 MG CAPS capsule TAKE 1 CAPSULE BY MOUTH EVERY DAY 90 capsule 1  . TRADJENTA 5 MG TABS tablet TAKE 1 TABLET BY MOUTH ONCE DAILY 90 tablet 1  . Vitamin D, Ergocalciferol, (DRISDOL) 50000 UNITS CAPS capsule TAKE 1 CAPSULE BY MOUTH EVERY 7 DAYS 12 capsule 0  . [DISCONTINUED] glipiZIDE (GLUCOTROL) 5 MG tablet Take 5 mg by mouth 2 (two) times daily before a meal.     No current facility-administered medications on file prior to visit.     Review of Systems  Constitutional: Negative for fever and chills.  HENT:  Positive for facial swelling. Negative for trouble swallowing.   Respiratory: Negative for cough, choking, chest tightness, shortness of breath, wheezing and stridor.       Objective:    BP 140/78 mmHg  Pulse 80  Temp(Src) 98 F (36.7 C) (Oral)  Resp 18  Ht 5' (1.524 m)  Wt 141 lb (63.957 kg)  BMI 27.54 kg/m2  SpO2 96% Nursing note and vital signs reviewed.  Physical Exam  Constitutional: She is oriented to person, place, and time. She appears well-developed and well-nourished. No distress.  Eyes:  Edema located under bilateral eyes.   Neck:  Hives located on the right posterior aspect of her neck that appear red with no scaling or discharge.   Cardiovascular: Normal rate, regular rhythm, normal heart sounds and intact distal pulses.   Pulmonary/Chest: Effort normal and breath sounds normal.  Neurological: She is alert and oriented to person, place, and time.  Skin: Skin is warm and dry.  Psychiatric: She has a normal mood and affect. Her behavior is normal. Judgment and thought content normal.       Assessment & Plan:   Problem List Items Addressed This Visit      Other   Allergic reaction - Primary    Continues to experience symptoms of reaction with no anaphylaxis. Has not started the Zyrtec. In office Depomedrol provided. Written prescription for prednisone taper if needed. Instructed that blood sugars may rise with prednisone. Follow up if symptoms worsen or fail to improve.       Relevant Medications   predniSONE (STERAPRED UNI-PAK 21 TAB) 10 MG (21) TBPK tablet   methylPREDNISolone acetate (DEPO-MEDROL) injection 80 mg (Start on 12/15/2014  5:00 PM)

## 2014-12-15 NOTE — Telephone Encounter (Signed)
She needs to be seen again It can take months to see a dermatologist Other option is the ER

## 2014-12-15 NOTE — Progress Notes (Signed)
   Subjective:    Patient ID: Michelle Lopez, female    DOB: 23-Feb-1945, 69 y.o.   MRN: [id number]  HPI The patient is a 69 YO female coming in for scalp rash and tongue swelling. She woke up feeling like her tongue was big. It did go down on its own in about 5 minutes. No problems with breathing and no swelling in her face or around her mouth. Does have rash all over her scalp. She had a recent hair treatment which immediately preceded the rash. She has had this in the past and it caused problems before. NO fevers or chills. Not otherwise feeling sick. Denies cough, facial swelling.   Review of Systems  Constitutional: Negative for fever, activity change, appetite change, fatigue and unexpected weight change.  HENT: Negative for congestion, ear discharge, ear pain, facial swelling, postnasal drip, rhinorrhea, sinus pressure, sore throat and trouble swallowing.        Tongue swelling  Eyes: Negative.   Respiratory: Negative.   Cardiovascular: Negative.   Gastrointestinal: Negative.   Skin: Positive for rash.      Objective:   Physical Exam  Constitutional: She appears well-developed and well-nourished.  HENT:  Head: Normocephalic and atraumatic.  Right Ear: External ear normal.  Left Ear: External ear normal.  Nose: Nose normal.  Mouth/Throat: Oropharynx is clear and moist.  Tongue normal size and color, no rash in the mouth and oropharynx without swelling.   Eyes: EOM are normal. Pupils are equal, round, and reactive to light.  Scalp with red rash and some appears to be chemical irritation diffusely. Not below the hair line.  Neck: Normal range of motion. Neck supple.  Cardiovascular: Normal rate and regular rhythm.   Pulmonary/Chest: Effort normal and breath sounds normal. No respiratory distress. She has no wheezes. She has no rales.  Abdominal: Soft. She exhibits no distension. There is no tenderness. There is no rebound and no guarding.  Skin: Skin is warm and dry. Rash noted.    Filed Vitals:   12/14/14 1621  BP: 128/82  Pulse: 78  Temp: 98.1 F (36.7 C)  TempSrc: Oral  Resp: 18  Height: 5' (1.524 m)  Weight: 141 lb 12 oz (64.297 kg)  SpO2: 94%      Assessment & Plan:  Flu shot given at visit.

## 2014-12-15 NOTE — Telephone Encounter (Signed)
Scheduled patient this afternoon at 3:45 with Terri Piedra

## 2014-12-29 ENCOUNTER — Other Ambulatory Visit: Payer: Self-pay | Admitting: Internal Medicine

## 2015-01-20 ENCOUNTER — Ambulatory Visit: Payer: Medicare Other | Admitting: Internal Medicine

## 2015-01-28 ENCOUNTER — Ambulatory Visit (INDEPENDENT_AMBULATORY_CARE_PROVIDER_SITE_OTHER): Payer: Medicare Other | Admitting: Family Medicine

## 2015-01-28 ENCOUNTER — Encounter: Payer: Self-pay | Admitting: Family Medicine

## 2015-01-28 VITALS — BP 128/72 | HR 80 | Ht 60.0 in | Wt 142.0 lb

## 2015-01-28 DIAGNOSIS — M25369 Other instability, unspecified knee: Secondary | ICD-10-CM

## 2015-01-28 DIAGNOSIS — M25362 Other instability, left knee: Secondary | ICD-10-CM | POA: Diagnosis not present

## 2015-01-28 NOTE — Patient Instructions (Signed)
Good to see you Ice 20 minutes 2 times daily. Usually after activity and before bed. Physical therapy [address] will be calling you Tried an injection today to calm it down.  We gave you a flu shot.  Be active They will call you on the brace next week See me agai nin 4 weeks to make sure we are doing well Happy holidays!

## 2015-01-28 NOTE — Progress Notes (Signed)
Pre visit review using our clinic review tool, if applicable. No additional management support is needed unless otherwise documented below in the visit note. 

## 2015-01-28 NOTE — Progress Notes (Signed)
[facility] [place], [place] [address] Phone: [phone] Subjective:     CC: Left knee pain follow up  [id number] Michelle Lopez is a 69 y.o. female coming in with complaint of left knee pain.patient was found to have moderate arthritis of the knee. Patient elected to try conservative therapy including home exercises, icing protocol, as well as over-the-counter medications. Patient states that she has not done any of this. Continues to have instability of the left knee. Walks with the aid of a cane. Patient has not been doing any of the exercises. Having difficult he finding the motivation. Patient continues to have instability of the knee stating sometimes it feels like it will give out on her. States that that is one reason why she does not do the activities. Patient is getting a custom brace but has not been able to fitted on her head teaching available to her yet. X-rays were taken at last exam showing moderate arthritis.  Past Medical History  Diagnosis Date  . Hyperlipidemia   . Diabetes mellitus     type 2, uncontrolled  . Essential hypertension, benign   . Cocaine abuse, unspecified   . Diarrhea   . Cerebrovascular disease, unspecified   . Stroke [facility])     x7   Past Surgical History  Procedure Laterality Date  . Total hip arthroplasty  1970    Dr. Rodell Perna   Social History   Social History  . Marital Status: Divorced    Spouse Name: N/A  . Number of Children: 4  . Years of Education: N/A   Occupational History  . Disability    Social History Main Topics  . Smoking status: Never Smoker   . Smokeless tobacco: Never Used  . Alcohol Use: No  . Drug Use: No  . Sexual Activity: Not Currently   Other Topics Concern  . Not on file   Social History Narrative   Daily caffeine    Allergies  Allergen Reactions  . Crestor [Rosuvastatin Calcium]     myalgias  . Morphine Hives  . Pravastatin Sodium Rash  .  Simvastatin Rash   Family History  Problem Relation Age of Onset  . Colon cancer Neg Hx         Past medical history, social, surgical and family history all reviewed in electronic medical record.   Review of Systems: No headache, visual changes, nausea, vomiting, diarrhea, constipation, dizziness, abdominal pain, skin rash, fevers, chills, night sweats, weight loss, swollen lymph nodes, body aches, joint swelling, muscle aches, chest pain, shortness of breath, mood changes.   Objective Blood pressure 128/72, pulse 80, height 5' (1.524 m), weight 142 lb (64.411 kg), SpO2 93 %.  General: No apparent distress alert and oriented x3 mood and affect normal, dressed appropriately.  HEENT: Pupils equal, extraocular movements intact  Respiratory: Patient's speak in full sentences and does not appear short of breath  Cardiovascular: No lower extremity edema, non tender, no erythema  Skin: Warm dry intact with no signs of infection or rash on extremities or on axial skeleton.  Abdomen: Soft nontender  Neuro: Cranial nerves II through XII are intact, neurovascularly intact in all extremities with 2+ DTRs and 2+ pulses.  Lymph: No lymphadenopathy of posterior or anterior cervical chain or axillae bilaterally.  Gait normal with good balance and coordination.  MSK:  Non tender with full range of motion and good stability and symmetric strength and tone of shoulders, elbows, wrist, hip  and ankles bilaterally.  Knee: Left Atrophy of the VMO compared to contralateral side Mild discomfort over the medial and lateral joint lines ROM full in flexion and extension and lower leg rotation. Mild laxity secondary to the loss of muscle strengthgets some instability. Negative Mcmurray's, Apley's, and Thessalonian tests. Non painful patellar compression. Patellar glide with mild crepitus. Patellar and quadriceps tendons unremarkable. Hamstring and quadriceps strength is 4 out of 5 compared to the  contralateral side Contralateral side mild tenderness but otherwise unremarkable   After informed written and verbal consent, patient was seated on exam table. Left knee was prepped with alcohol swab and utilizing anterolateral approach, patient's left knee space was injected with 4:1  marcaine 0.5%: Kenalog 40mg /dL. Patient tolerated the procedure well without immediate complications.    Impression and Recommendations:     This case required medical decision making of moderate complexity.

## 2015-01-28 NOTE — Assessment & Plan Note (Signed)
Multifactorial secondary to patient's weakness from stroke as well as the moderate arthritis. Patient given injection today and tolerated the procedure well. Patient will monitor blood sugars accordingly. We also discussed with patient about the bracing and she will have this excised in the near future. I do think that these 2 things will be very beneficial for patient. 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 Patient will come back again in 4 weeks and see how she responding. Sent to formal physical therapy. If patient has any worsening symptoms she could be a candidate for viscous supplementation.

## 2015-02-17 ENCOUNTER — Ambulatory Visit: Payer: Medicare Other | Attending: Internal Medicine

## 2015-02-17 DIAGNOSIS — R262 Difficulty in walking, not elsewhere classified: Secondary | ICD-10-CM | POA: Insufficient documentation

## 2015-02-17 DIAGNOSIS — R29898 Other symptoms and signs involving the musculoskeletal system: Secondary | ICD-10-CM | POA: Insufficient documentation

## 2015-02-17 DIAGNOSIS — M25362 Other instability, left knee: Secondary | ICD-10-CM | POA: Diagnosis not present

## 2015-02-17 DIAGNOSIS — R6889 Other general symptoms and signs: Secondary | ICD-10-CM | POA: Diagnosis not present

## 2015-02-17 DIAGNOSIS — R2681 Unsteadiness on feet: Secondary | ICD-10-CM | POA: Insufficient documentation

## 2015-02-17 NOTE — Therapy (Signed)
[address] [place], [place], [address] Phone: [phone]   Fax:  [phone]  Physical Therapy Evaluation  Patient Details  Name: Michelle Lopez MRN: [id number] Date of Birth: 22-Nov-1945 Referring Provider: Hulan Saas, MD  Encounter Date: 02/17/2015      PT End of Session - 02/17/15 1341    Visit Number 1   Number of Visits 12   Date for PT Re-Evaluation 04/28/15   PT Start Time 1238   PT Stop Time 1330   PT Time Calculation (min) 52 min   Activity Tolerance Patient tolerated treatment well   Behavior During Therapy [facility] for tasks assessed/performed      Past Medical History  Diagnosis Date  . Hyperlipidemia   . Diabetes mellitus     type 2, uncontrolled  . Essential hypertension, benign   . Cocaine abuse, unspecified   . Diarrhea   . Cerebrovascular disease, unspecified   . Stroke [facility])     x7    Past Surgical History  Procedure Laterality Date  . Total hip arthroplasty  1970    Dr. Rodell Perna    There were no vitals filed for this visit.  Visit Diagnosis:  Weakness of left leg - Plan: PT plan of care cert/re-cert  Difficulty walking - Plan: PT plan of care cert/re-cert  Gait instability - Plan: PT plan of care cert/re-cert  Activity intolerance - Plan: PT plan of care cert/re-cert  Instability of left knee joint - Plan: PT plan of care cert/re-cert      Subjective Assessment - 02/17/15 1248    Subjective Sjhe has had more than one CVA. Last was in 2016. She has been walking for past year with SPC.  She reports walking stairs for exercises at home. Does not exercise on regular basis    Pertinent History vertigomoderate OA both knees    Limitations Walking  unable to shop now.    How long can you sit comfortably? As needed   How long can you stand comfortably? 10 min due to fatigue   How long can you walk comfortably? Shorter distances   Patient Stated Goals Walk better..   Currently  in Pain? No/denies  Occasionall knee pain and will give away.             [facility] PT Assessment - 02/17/15 1244    Assessment   Medical Diagnosis LT knee buckling , post CVA weakness   Referring Provider Hulan Saas, MD   Onset Date/Surgical Date --  2016   Next MD Visit PRN   Prior Therapy Not sure but has had some in past.    Precautions   Precautions Fall   Restrictions   Weight Bearing Restrictions No   Balance Screen   Has the patient fallen in the past 6 months No   Has the patient had a decrease in activity level because of a fear of falling?  No   Is the patient reluctant to leave their home because of a fear of falling?  No   Home Environment   Living Environment Private residence   Living Arrangements Alone   Type of [place] to enter   Entrance Stairs-Number of Steps 15   Entrance Stairs-Rails Left;Right;Cannot reach both   Cognition   Overall Cognitive Status Within Functional Limits for tasks assessed   ROM / Strength   AROM / PROM / Strength AROM;Strength   AROM   AROM Assessment Site  Knee   Right/Left Knee Right;Left   Right Knee Extension 0   Right Knee Flexion 135   Left Knee Extension 0   Left Knee Flexion 135   Strength   Overall Strength Comments Poor to fair abdominals   Strength Assessment Site Knee;Hip   Right/Left Hip Right;Left   Right Hip Flexion 4+/5   Right Hip Extension 4/5   Right Hip External Rotation  5/5   Right Hip Internal Rotation 4+/5   Right Hip ABduction 4/5   Right Hip ADduction 5/5   Left Hip Flexion 3/5   Left Hip Extension 3+/5   Left Hip External Rotation 3-/5   Left Hip Internal Rotation 4/5   Left Hip ABduction 3/5  clam 3/5   Left Hip ADduction 5/5   Right/Left Knee Right;Left   Right Knee Flexion 5/5   Right Knee Extension 5/5   Left Knee Flexion 4+/5   Left Knee Extension 4+/5   Palpation   Palpation comment She appears to have some medial/lateral laxity LT knee compared to RT.     Transfers   Transfers Independent with all Transfers   Comments She has difficulty rolling LT and RT   Ambulation/Gait   Assistive device Straight cane   Gait Pattern Step-through pattern   Standardized Balance Assessment   Standardized Balance Assessment Berg Balance Test   Berg Balance Test   Sit to Stand Able to stand without using hands and stabilize independently   Standing Unsupported Able to stand 2 minutes with supervision   Sitting with Back Unsupported but Feet Supported on Floor or Stool Able to sit safely and securely 2 minutes   Stand to Sit Sits safely with minimal use of hands   Transfers Able to transfer safely, definite need of hands   Standing Unsupported with Eyes Closed Able to stand 10 seconds with supervision   Standing Ubsupported with Feet Together Able to place feet together independently and stand for 1 minute with supervision   From Standing, Reach Forward with Outstretched Arm Can reach forward >12 cm safely (5")   From Standing Position, Pick up Object from Jay to pick up shoe, needs supervision   From Standing Position, Turn to Look Behind Over each Shoulder Needs supervision when turning   Turn 360 Degrees Needs close supervision or verbal cueing   Standing Unsupported, Alternately Place Feet on Step/Stool Able to complete >2 steps/needs minimal assist   Standing Unsupported, One Foot in Front Able to take small step independently and hold 30 seconds   Standing on One Leg Unable to try or needs assist to prevent fall   Total Score 35                           PT Education - 02/17/15 1340    Education provided Yes   Education Details POC,  Educated on resuts of BERG test and with score of 35/56 she would be safest walking with a walker   Person(s) Educated Patient   Methods Explanation   Comprehension Verbalized understanding    Also discussed with pt daughter.       PT Short Term Goals - 02/17/15 1354    PT SHORT TERM  GOAL #1   Title She will be independent with inital HEP   Time 3   Period Weeks   Status New   PT SHORT TERM GOAL #2   Title She will improve her BERG score to 40/56 to  demo improved balance for safe ambulation   Time 3   Period Weeks   Status New           PT Long Term Goals - 03/08/15 1356    PT LONG TERM GOAL #1   Title She will be independent with all HEP as of last visit   Time 6   Period Weeks   Status New   PT LONG TERM GOAL #2   Title She will improve BERG score to 45/56 to improve safety with  walking with SPC   Time 6   Period Weeks   Status New   PT LONG TERM GOAL #3   Title She will improve LT hip strength to 4/5 to improve stability /balance with walking   Time 6   Period Weeks   Status New   PT LONG TERM GOAL #4   Title she will report greater ease with gerneral walking in community and home   Time 6   Period Weeks   Status New   PT LONG TERM GOAL #5   Title She will be able to walk 600 feet for improved community ambulation tolerance   Time 6   Period Weeks   Status New               Plan - 03/08/15 1348    Pt will benefit from skilled therapeutic intervention in order to improve on the following deficits Decreased activity tolerance;Difficulty walking;Decreased strength   Rehab Potential Good   PT Frequency 2x / week   PT Duration 6 weeks   PT Treatment/Interventions Cryotherapy;Moist Heat;Therapeutic exercise;Balance training;Functional mobility training;Patient/family education;Manual techniques;Passive range of motion;Taping   PT Next Visit Plan LE strength, balance training   Consulted and Agree with Plan of Care Patient          G-Codes - 03-08-15 1404    Functional Assessment Tool Used FOTO 78% limited   Functional Limitation Mobility: Walking and moving around   Mobility: Walking and Moving Around Current Status [phone]) At least 60 percent but less than 80 percent impaired, limited or restricted   Mobility: Walking and Moving  Around Goal Status [phone]) At least 40 percent but less than 60 percent impaired, limited or restricted       Problem List Patient Active Problem List   Diagnosis Date Noted  . Allergic reaction 12/15/2014  . Instability of left knee joint 11/02/2014  . Knee buckling 09/29/2014  . Osteopenia, senile 03/03/2014  . 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 Seborrheic dermatitis 03/05/2013  . Dysphagia as late effect of stroke 05/30/2011  . 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 Routine general medical examination at a health care facility 05/02/2011  . Hyperlipidemia with target LDL less than 70   . Visit for screening mammogram 09/15/2010  . OSTEOARTHRITIS, HIP, RIGHT 07/03/2009  . DEPRESSION 06/24/2009  . VERTIGO, CENTRAL 01/14/2009  . Insomnia 01/14/2009  . CVA (cerebral infarction) 06/11/2008  . DM (diabetes mellitus), type 2 with renal complications ([place]) [id number]  . Essential hypertension, benign 06/05/2007    Darrel Hoover PT 2015/03/08, 2:07 PM  [facility] [address] [place], [place], [address] Phone: [phone]   Fax:  [phone]  Name: Sheli Dingess MRN: [id number] Date of Birth: 03-07-1945

## 2015-02-22 ENCOUNTER — Ambulatory Visit: Payer: Medicare Other

## 2015-02-24 ENCOUNTER — Ambulatory Visit: Payer: Medicare Other

## 2015-02-24 DIAGNOSIS — R2681 Unsteadiness on feet: Secondary | ICD-10-CM

## 2015-02-24 DIAGNOSIS — R29898 Other symptoms and signs involving the musculoskeletal system: Secondary | ICD-10-CM | POA: Diagnosis not present

## 2015-02-24 DIAGNOSIS — R262 Difficulty in walking, not elsewhere classified: Secondary | ICD-10-CM

## 2015-02-24 DIAGNOSIS — R6889 Other general symptoms and signs: Secondary | ICD-10-CM | POA: Diagnosis not present

## 2015-02-24 DIAGNOSIS — M25362 Other instability, left knee: Secondary | ICD-10-CM

## 2015-02-24 NOTE — Patient Instructions (Addendum)
Abduction: Clam (Eccentric) - Side-Lying    Lie on side with knees bent. Lift top knee, keeping feet together. Keep trunk steady. Slowly lower for 3-5 seconds. _10-15__ reps per set, __1-2_ sets per day, _4-5__ days per week. Add ___ lbs when you achieve ___ repetitions.  http://ecce.exer.us/65   Copyright  VHI. All rights reserved.  Bridge    Lie back, legs bent. Inhale, pressing hips up. Keeping ribs in, lengthen lower back. Exhale, rolling down along spine from top. Repeat _10-15___ times. Do _1-2___ sessions per day.  http://pm.exer.us/55   Copyright  VHI. All rights reserved.  FLEXION: Supine (Active)    Lie on back, legs straight. Draw right knee toward chest. Use ___ lbs. Complete __3_ sets of _5__ repetitions. Perform _1-2ANKLE: Plantarflexion, Bilateral - Standing    Stand with upright posture. Raise heels up as high as possible. _15__ reps per set, _1-2__ sets per day, __4-5_ days per week Hold onto a support.  Copyright  VHI. All rights reserved.  1-2__ sessions per day.  Copyright  VHI. All rights reserved.  Also issued fro cabinet LAQ 10-15 reps 4-5x/week 1-2x/day

## 2015-02-24 NOTE — Therapy (Signed)
[place], [place], [address] Phone: [phone]   Fax:  [phone]  Physical Therapy Treatment  Patient Details  Name: Michelle Lopez MRN: [id number] Date of Birth: 03-06-1945 Referring Provider: Hulan Saas, MD  Encounter Date: 02/24/2015      PT End of Session - 02/24/15 1121    Visit Number 2   Number of Visits 12   Date for PT Re-Evaluation 04/28/15   PT Start Time 1100   PT Stop Time 1145   PT Time Calculation (min) 45 min   Activity Tolerance Patient tolerated treatment well   Behavior During Therapy [facility] for tasks assessed/performed      Past Medical History  Diagnosis Date  . Hyperlipidemia   . Diabetes mellitus     type 2, uncontrolled  . Essential hypertension, benign   . Cocaine abuse, unspecified   . Diarrhea   . Cerebrovascular disease, unspecified   . Stroke [facility])     x7    Past Surgical History  Procedure Laterality Date  . Total hip arthroplasty  1970    Dr. Rodell Perna    There were no vitals filed for this visit.  Visit Diagnosis:  Weakness of left leg  Difficulty walking  Gait instability  Activity intolerance  Instability of left knee joint      Subjective Assessment - 02/24/15 1119    Subjective No changes.    Currently in Pain? No/denies                         [facility] Adult PT Treatment/Exercise - 02/24/15 1122    Knee/Hip Exercises: Standing   Hip Abduction Stengthening;Left;Knee straight  12 reps   Lateral Step Up Left;15 reps;Step Height: 6";Hand Hold: 2   Knee/Hip Exercises: Seated   Long Arc Quad Strengthening;Left;15 reps   Long Arc Quad Weight 4 lbs.   Long [employer] Limitations 5 sec hold   Knee/Hip Exercises: Supine   Bridges Strengthening;Both;15 reps   Bridges with [employer] Strengthening;Both;15 reps   Other Supine Knee/Hip Exercises clams with green band   Other Supine Knee/Hip Exercises Active hip flexion LT 3x5  reps   Ankle Exercises: Standing   Heel Raises 15 reps;2 seconds   Toe Raise 15 reps;1 second                PT Education - 02/24/15 1132    Education provided Yes   Education Details HEP   Person(s) Educated Patient   Methods Explanation;Verbal cues;Tactile cues;Handout   Comprehension Returned demonstration          PT Short Term Goals - 02/17/15 1354    PT SHORT TERM GOAL #1   Title She will be independent with inital HEP   Time 3   Period Weeks   Status New   PT SHORT TERM GOAL #2   Title She will improve her BERG score to 40/56 to demo improved balance for safe ambulation   Time 3   Period Weeks   Status New           PT Long Term Goals - 02/17/15 1356    PT LONG TERM GOAL #1   Title She will be independent with all HEP as of last visit   Time 6   Period Weeks   Status New   PT LONG TERM GOAL #2   Title She will improve BERG score to 45/56 to improve safety with  walking with SPC   Time 6   Period Weeks   Status New   PT LONG TERM GOAL #3   Title She will improve LT hip strength to 4/5 to improve stability /balance with walking   Time 6   Period Weeks   Status New   PT LONG TERM GOAL #4   Title she will report greater ease with gerneral walking in community and home   Time 6   Period Weeks   Status New   PT LONG TERM GOAL #5   Title She will be able to walk 600 feet for improved community ambulation tolerance   Time 6   Period Weeks   Status New               Problem List Patient Active Problem List   Diagnosis Date Noted  . Allergic reaction 12/15/2014  . Instability of left knee joint 11/02/2014  . Knee buckling 09/29/2014  . Osteopenia, senile 03/03/2014  . 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 Seborrheic dermatitis 03/05/2013  . Dysphagia as late effect of stroke 05/30/2011  . 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 Routine general medical examination at a health care  facility 05/02/2011  . Hyperlipidemia with target LDL less than 70   . Visit for screening mammogram 09/15/2010  . OSTEOARTHRITIS, HIP, RIGHT 07/03/2009  . DEPRESSION 06/24/2009  . VERTIGO, CENTRAL 01/14/2009  . Insomnia 01/14/2009  . CVA (cerebral infarction) 06/11/2008  . DM (diabetes mellitus), type 2 with renal complications ([place]) [id number]  . Essential hypertension, benign 06/05/2007    Darrel Hoover PT 02/24/2015, 11:49 AM  [facility] [address] [place], [place], [address] Phone: [phone]   Fax:  [phone]  Name: Evita Brigance MRN: [id number] Date of Birth: [birth year]

## 2015-02-25 ENCOUNTER — Ambulatory Visit (INDEPENDENT_AMBULATORY_CARE_PROVIDER_SITE_OTHER): Payer: Medicare Other | Admitting: Internal Medicine

## 2015-02-25 ENCOUNTER — Ambulatory Visit (INDEPENDENT_AMBULATORY_CARE_PROVIDER_SITE_OTHER)
Admission: RE | Admit: 2015-02-25 | Discharge: 2015-02-25 | Disposition: A | Discharge status: Home / Self Care | Payer: Medicare Other | Source: Ambulatory Visit | Attending: Internal Medicine | Admitting: Internal Medicine

## 2015-02-25 ENCOUNTER — Encounter: Payer: Self-pay | Admitting: Internal Medicine

## 2015-02-25 ENCOUNTER — Other Ambulatory Visit (INDEPENDENT_AMBULATORY_CARE_PROVIDER_SITE_OTHER): Payer: Medicare Other

## 2015-02-25 VITALS — BP 158/80 | HR 73 | Temp 97.8°F | Ht 60.0 in | Wt 142.0 lb

## 2015-02-25 DIAGNOSIS — R0600 Dyspnea, unspecified: Secondary | ICD-10-CM

## 2015-02-25 DIAGNOSIS — E1122 Type 2 diabetes mellitus with diabetic chronic kidney disease: Secondary | ICD-10-CM

## 2015-02-25 DIAGNOSIS — J069 Acute upper respiratory infection, unspecified: Secondary | ICD-10-CM | POA: Diagnosis not present

## 2015-02-25 DIAGNOSIS — K921 Melena: Secondary | ICD-10-CM

## 2015-02-25 DIAGNOSIS — N183 Chronic kidney disease, stage 3 (moderate): Secondary | ICD-10-CM | POA: Diagnosis not present

## 2015-02-25 DIAGNOSIS — R0602 Shortness of breath: Secondary | ICD-10-CM | POA: Diagnosis not present

## 2015-02-25 LAB — BASIC METABOLIC PANEL
BUN: 17 mg/dL (ref 6–23)
CHLORIDE: 101 meq/L (ref 96–112)
CO2: 32 meq/L (ref 19–32)
Calcium: 9.7 mg/dL (ref 8.4–10.5)
Creatinine, Ser: 1.08 mg/dL (ref 0.40–1.20)
GFR: 64.13 mL/min (ref >60.00)
Glucose, Bld: 151 mg/dL — ABNORMAL HIGH (ref 70–99)
Potassium: 4.5 mEq/L (ref 3.5–5.1)
SODIUM: 141 meq/L (ref 135–145)

## 2015-02-25 LAB — CBC WITH DIFFERENTIAL/PLATELET
BASOS ABS: 0 10*3/uL (ref 0.0–0.1)
BASOS PCT: 0.4 % (ref 0.0–3.0)
EOS ABS: 0.1 10*3/uL (ref 0.0–0.7)
Eosinophils Relative: 0.7 % (ref 0.0–5.0)
HCT: 45.2 % (ref 36.0–46.0)
Hemoglobin: 14.4 g/dL (ref 12.0–15.0)
Lymphocytes Relative: 34.5 % (ref 12.0–46.0)
Lymphs Abs: 2.7 10*3/uL (ref 0.7–4.0)
MCHC: 31.9 g/dL (ref 30.0–36.0)
MCV: 81.7 fl (ref 78.0–100.0)
MONOS PCT: 6.7 % (ref 3.0–12.0)
Monocytes Absolute: 0.5 10*3/uL (ref 0.1–1.0)
NEUTROS ABS: 4.4 10*3/uL (ref 1.4–7.7)
NEUTROS PCT: 57.7 % (ref 43.0–77.0)
PLATELETS: 277 10*3/uL (ref 150.0–400.0)
RBC: 5.53 Mil/uL — ABNORMAL HIGH (ref 3.87–5.11)
RDW: 14.4 % (ref 11.5–15.5)
WBC: 7.7 10*3/uL (ref 4.0–10.5)

## 2015-02-25 LAB — HEMOGLOBIN A1C: HEMOGLOBIN A1C: 7.3 % — AB (ref 4.6–6.5)

## 2015-02-25 LAB — PROTIME-INR
INR: 1.1 ratio — ABNORMAL HIGH (ref 0.8–1.0)
PROTHROMBIN TIME: 11.8 s (ref 9.6–13.1)

## 2015-02-25 LAB — HEPATIC FUNCTION PANEL
ALK PHOS: 70 U/L (ref 39–117)
ALT: 8 U/L (ref 0–35)
AST: 10 U/L (ref 0–37)
Albumin: 4.1 g/dL (ref 3.5–5.2)
BILIRUBIN DIRECT: 0.1 mg/dL (ref 0.0–0.3)
BILIRUBIN TOTAL: 0.4 mg/dL (ref 0.2–1.2)
TOTAL PROTEIN: 7.7 g/dL (ref 6.0–8.3)

## 2015-02-25 LAB — TSH: TSH: 1.22 u[IU]/mL (ref 0.35–4.50)

## 2015-02-25 NOTE — Patient Instructions (Addendum)
Please continue all other medications as before, and refills have been done if requested.  Please have the pharmacy call with any other refills you may need.  Please keep your appointments with your specialists as you may have planned  You will be contacted regarding the referral for: Gastroenterology  Please go to the XRAY Department in the Basement (go straight as you get off the elevator) for the x-ray testing  Please go to the LAB in the Basement (turn left off the elevator) for the tests to be done today  You will be contacted by phone if any changes need to be made immediately.  Otherwise, you will receive a letter about your results with an explanation, but please check with MyChart first.  Please remember to sign up for MyChart if you have not done so, as this will be important to you in the future with finding out test results, communicating by private email, and scheduling acute appointments online when needed.  Please return in 1 week to Dr Ronnald Ramp, or sooner if needed

## 2015-02-25 NOTE — Assessment & Plan Note (Signed)
.  stable overall by history and exam, recent data reviewed with pt, and pt to continue medical treatment as before,  to f/u any worsening symptoms or concerns Lab Results  Component Value Date   HGBA1C 7.2* 06/23/2014

## 2015-02-25 NOTE — Assessment & Plan Note (Signed)
Likely viral, for mucinex, tylenol prn, rest, fluids

## 2015-02-25 NOTE — Progress Notes (Signed)
Subjective:    Patient ID: Michelle Lopez, female    DOB: 1946/02/07, 70 y.o.   MRN: [id number]  HPI  Here to f/u with acute visit for new onset BRBPR, occurred one episode yesterday possibly moderate amount, then much smaller amont second time.  No rectal pain, no abd pain, Denies worsening reflux, abd pain, dysphagia, n/v, bowel change.  Pt denies chest pain, wheezing, orthopnea, PND, increased LE swelling, palpitations, dizziness or syncope, though has some doe unsuual for her in the past wk, and in last 2 days with sinus congestion she feels likely a viral like cold..   Pt denies fever, wt loss, night sweats, loss of appetite, or other constitutional symptoms  No prior hx of same.  Has not had prior colonoscopy. Taking low dose asa and plavix only, no anticoagulant.  No falls.  No blood with BM today   Pt denies polydipsia, polyuria,  Past Medical History  Diagnosis Date  . Hyperlipidemia   . Diabetes mellitus     type 2, uncontrolled  . Essential hypertension, benign   . Cocaine abuse, unspecified   . Diarrhea   . Cerebrovascular disease, unspecified   . Stroke [facility])     x7   Past Surgical History  Procedure Laterality Date  . Total hip arthroplasty  1970    Dr. Rodell Perna    reports that she has never smoked. She has never used smokeless tobacco. She reports that she does not drink alcohol or use illicit drugs. family history is negative for Colon cancer. Allergies  Allergen Reactions  . Crestor [Rosuvastatin Calcium]     myalgias  . Morphine Hives  . Pravastatin Sodium Rash  . Simvastatin Rash   Current Outpatient Prescriptions on File Prior to Visit  Medication Sig Dispense Refill  . aspirin 81 MG tablet Take 81 mg by mouth daily.      Marland Kitchen atorvastatin (LIPITOR) 20 MG tablet Take 1 tablet (20 mg total) by mouth daily. 90 tablet 3  . Blood Glucose Monitoring Suppl (ONE TOUCH BASIC SYSTEM) W/DEVICE KIT Test up to TID dx: 250.42 1 each 1  . Canagliflozin (INVOKANA) 300 MG  TABS Take 1 tablet (300 mg total) by mouth daily. 90 tablet 3  . cetirizine (ZYRTEC) 10 MG tablet Take 1 tablet (10 mg total) by mouth 2 (two) times daily. 30 tablet 0  . cetirizine (ZYRTEC) 10 MG tablet TAKE 1 TABLET BY MOUTH ONCE DAILY 30 tablet 3  . clopidogrel (PLAVIX) 75 MG tablet Take 1 tablet (75 mg total) by mouth daily. 90 tablet 3  . desonide (DESOWEN) 0.05 % lotion Apply topically 2 (two) times daily. 59 mL 2  . Doxepin HCl (SILENOR) 6 MG TABS Take 1 tablet (6 mg total) by mouth at bedtime as needed. 90 tablet 3  . ezetimibe (ZETIA) 10 MG tablet Take 1 tablet (10 mg total) by mouth daily. 90 tablet 3  . glucose blood test strip Test up to TID dx: 250.42 100 each 12  . INVOKANA 300 MG TABS tablet TAKE 1 TABLET BY MOUTH EVERY DAY 90 tablet 1  . ketoconazole (NIZORAL) 2 % cream Apply 1 application topically 2 (two) times daily. 60 g 2  . linagliptin (TRADJENTA) 5 MG TABS tablet Take 1 tablet (5 mg total) by mouth daily. 90 tablet 3  . losartan (COZAAR) 100 MG tablet Take 1 tablet (100 mg total) by mouth daily. 90 tablet 3  . losartan (COZAAR) 100 MG tablet TAKE 1 TABLET BY MOUTH  ONCE DAILY 90 tablet 1  . metFORMIN (GLUCOPHAGE) 1000 MG tablet Take 1 tablet (1,000 mg total) by mouth 2 (two) times daily with a meal. 180 tablet 3  . metFORMIN (GLUCOPHAGE) 1000 MG tablet TAKE 1 TABLET BY MOUTH TWICE DAILY WITH A MEAL 180 tablet 1  . metoprolol tartrate (LOPRESSOR) 25 MG tablet Take 0.5 tablets (12.5 mg total) by mouth 2 (two) times daily. 180 tablet 3  . ONE TOUCH LANCETS MISC Test up to TID dx: 250.42 200 each 11  . predniSONE (STERAPRED UNI-PAK 21 TAB) 10 MG (21) TBPK tablet Take 6 tablets x 1 day, 5 tablets x 1 day, 4 tablets x 1 day, 3 tablets x 1 day, 2 tablets x 1 day, 1 tablet x 1 day 21 tablet 0  . PRODIGY TWIST TOP LANCETS 28G MISC USE TO CHECK BLOOD SUGAR TWICE DAILY 100 each PRN  . tamsulosin (FLOMAX) 0.4 MG CAPS capsule Take 1 capsule (0.4 mg total) by mouth daily. 90 capsule 3  .  tamsulosin (FLOMAX) 0.4 MG CAPS capsule TAKE 1 CAPSULE BY MOUTH EVERY DAY 90 capsule 1  . TRADJENTA 5 MG TABS tablet TAKE 1 TABLET BY MOUTH ONCE DAILY 90 tablet 1  . Vitamin D, Ergocalciferol, (DRISDOL) 50000 UNITS CAPS capsule TAKE 1 CAPSULE BY MOUTH EVERY 7 DAYS 12 capsule 0  . [DISCONTINUED] glipiZIDE (GLUCOTROL) 5 MG tablet Take 5 mg by mouth 2 (two) times daily before a meal.     No current facility-administered medications on file prior to visit.    Review of Systems  Constitutional: Negative for unusual diaphoresis or night sweats HENT: Negative for ringing in ear or discharge Eyes: Negative for double vision or worsening visual disturbance.  Respiratory: Negative for choking and stridor.   Gastrointestinal: Negative for vomiting or other signifcant bowel change Genitourinary: Negative for hematuria or change in urine volume.  Musculoskeletal: Negative for other MSK pain or swelling Skin: Negative for color change and worsening wound.  Neurological: Negative for tremors and numbness other than noted  Psychiatric/Behavioral: Negative for decreased concentration or agitation other than above       Objective:   Physical Exam BP 158/80 mmHg  Pulse 73  Temp(Src) 97.8 F (36.6 C) (Oral)  Ht 5' (1.524 m)  Wt 142 lb (64.411 kg)  BMI 27.73 kg/m2  SpO2 96% VS noted, not ill appearing, mild fatigued Constitutional: Pt appears in no significant distress HENT: Head: NCAT.  Right Ear: External ear normal.  Left Ear: External ear normal.  Bilat tm's with mild erythema.  Max sinus areas non tender.  Pharynx with mild erythema, no exudate Eyes: . Pupils are equal, round, and reactive to light. Conjunctivae and EOM are normal Neck: Normal range of motion. Neck supple.  Cardiovascular: Normal rate and regular rhythm.   Pulmonary/Chest: Effort normal and breath sounds decresaed without rales or wheezing.  Abd:  Soft, NT, ND, + BS; DRE: deferred per pt Neurological: Pt is alert. Not  confused , some slurred speech related to prior stroke Skin: Skin is warm. No rash, no LE edema Psychiatric: Pt behavior is normal. No agitation.     Assessment & Plan:

## 2015-02-25 NOTE — Assessment & Plan Note (Addendum)
Mild transient yesterday only per pt, for labs today, also refer GI as she would consider colonoscpy, f/u PCP 1 wk

## 2015-02-25 NOTE — Assessment & Plan Note (Signed)
?   Etiology, exam not helpful, for cxr today, ? Could be sympt anemia - for labs as well

## 2015-02-26 ENCOUNTER — Encounter: Payer: Self-pay | Admitting: Internal Medicine

## 2015-02-26 ENCOUNTER — Other Ambulatory Visit: Payer: Self-pay | Admitting: Internal Medicine

## 2015-02-26 DIAGNOSIS — R0600 Dyspnea, unspecified: Secondary | ICD-10-CM

## 2015-02-26 DIAGNOSIS — R9389 Abnormal findings on diagnostic imaging of other specified body structures: Secondary | ICD-10-CM

## 2015-02-26 MED ORDER — FUROSEMIDE 20 MG PO TABS: 20.0000 mg | ORAL_TABLET | Freq: Every day | ORAL | Status: DC

## 2015-03-02 ENCOUNTER — Ambulatory Visit: Payer: Medicare Other | Admitting: Physical Therapy

## 2015-03-04 ENCOUNTER — Ambulatory Visit: Payer: Medicare Other

## 2015-03-04 DIAGNOSIS — R29898 Other symptoms and signs involving the musculoskeletal system: Secondary | ICD-10-CM | POA: Diagnosis not present

## 2015-03-04 DIAGNOSIS — R6889 Other general symptoms and signs: Secondary | ICD-10-CM | POA: Diagnosis not present

## 2015-03-04 DIAGNOSIS — M25362 Other instability, left knee: Secondary | ICD-10-CM

## 2015-03-04 DIAGNOSIS — R2681 Unsteadiness on feet: Secondary | ICD-10-CM

## 2015-03-04 DIAGNOSIS — R262 Difficulty in walking, not elsewhere classified: Secondary | ICD-10-CM

## 2015-03-04 NOTE — Therapy (Addendum)
[place], [place], [address] Phone: [phone]   Fax:  [phone]  Physical Therapy Treatment  Patient Details  Name: Michelle Lopez MRN: [id number] Date of Birth: 15-Jun-1945 Referring Provider: Hulan Saas, MD  Encounter Date: 03/04/2015      PT End of Session - 03/04/15 1008    Visit Number 3   Number of Visits 12   Date for PT Re-Evaluation 04/28/15   PT Start Time 0935   PT Stop Time 1015   PT Time Calculation (min) 40 min   Activity Tolerance Patient tolerated treatment well  reported fatigue   Behavior During Therapy [facility] for tasks assessed/performed      Past Medical History  Diagnosis Date  . Hyperlipidemia   . Diabetes mellitus     type 2, uncontrolled  . Essential hypertension, benign   . Cocaine abuse, unspecified   . Diarrhea   . Cerebrovascular disease, unspecified   . Stroke [facility])     x7    Past Surgical History  Procedure Laterality Date  . Total hip arthroplasty  1970    Dr. Rodell Perna    There were no vitals filed for this visit.  Visit Diagnosis:  Weakness of left leg  Difficulty walking  Gait instability  Activity intolerance  Instability of left knee joint      Subjective Assessment - 03/04/15 0942    Subjective Doing about the same.    Currently in Pain? No/denies                         [facility] Adult PT Treatment/Exercise - 03/04/15 0943    Knee/Hip Exercises: Aerobic   Nustep L4 LE only x 6 min   Knee/Hip Exercises: Seated   Long Arc Quad Strengthening;Left;15 reps   Long Arc Quad Weight 5 lbs.   Sit to Sand 10 reps;without UE support   Knee/Hip Exercises: Supine   Bridges Strengthening;Both;20 reps   Bridges with [employer] Strengthening;Both;15 reps   Bridges with Clamshell 15 reps;Both   Knee/Hip Exercises: Sidelying   Hip ABduction Left;15 reps   Clams 15 reps  LT                  PT Short Term Goals -  02/17/15 1354    PT SHORT TERM GOAL #1   Title She will be independent with inital HEP   Time 3   Period Weeks   Status New   PT SHORT TERM GOAL #2   Title She will improve her BERG score to 40/56 to demo improved balance for safe ambulation   Time 3   Period Weeks   Status New           PT Long Term Goals - 02/17/15 1356    PT LONG TERM GOAL #1   Title She will be independent with all HEP as of last visit   Time 6   Period Weeks   Status New   PT LONG TERM GOAL #2   Title She will improve BERG score to 45/56 to improve safety with  walking with SPC   Time 6   Period Weeks   Status New   PT LONG TERM GOAL #3   Title She will improve LT hip strength to 4/5 to improve stability /balance with walking   Time 6   Period Weeks   Status New   PT LONG TERM GOAL #4   Title  she will report greater ease with gerneral walking in community and home   Time 6   Period Weeks   Status New   PT LONG TERM GOAL #5   Title She will be able to walk 600 feet for improved community ambulation tolerance   Time 6   Period Weeks   Status New               Plan - 03/04/15 1008    Clinical Impression Statement She reported fatigue and dizines after stating she is alway dizzy. BP after was 142/90  and HR 75. She did not know how this normally runs.   She did all exercise without complaint.   I suggesteed she hold to her caregiver that picks her up if she feels particularly unsteady.. She was sitting in chair when I left her in lobby.   Pt will benefit from skilled therapeutic intervention in order to improve on the following deficits Decreased activity tolerance;Difficulty walking;Decreased strength   Rehab Potential Good   PT Next Visit Plan BALANCE and standiong strength.   HEP supine and seated   Consulted and Agree with Plan of Care Patient        Problem List Patient Active Problem List   Diagnosis Date Noted  . Acute upper respiratory infection 02/25/2015  . Hematochezia  02/25/2015  . Dyspnea 02/25/2015  . Allergic reaction 12/15/2014  . Instability of left knee joint 11/02/2014  . Knee buckling 09/29/2014  . Osteopenia, senile 03/03/2014  . 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 Seborrheic dermatitis 03/05/2013  . Dysphagia as late effect of stroke 05/30/2011  . 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 Routine general medical examination at a health care facility 05/02/2011  . Hyperlipidemia with target LDL less than 70   . Visit for screening mammogram 09/15/2010  . OSTEOARTHRITIS, HIP, RIGHT 07/03/2009  . DEPRESSION 06/24/2009  . VERTIGO, CENTRAL 01/14/2009  . Insomnia 01/14/2009  . CVA (cerebral infarction) 06/11/2008  . DM (diabetes mellitus), type 2 with renal complications ([place]) 64/84/7207  . Essential hypertension, benign 06/05/2007    Darrel Hoover PT 03/04/2015, 10:20 AM  [facility] [address] [place], [place], [address] Phone: [phone]   Fax:  [phone]  Name: Michelle Lopez MRN: [id number] Date of Birth: 1945-05-19    PHYSICAL THERAPY DISCHARGE SUMMARY  Visits from Start of Care: 3  Current functional level related to goals / functional outcomes: Unknown as she did not return   Remaining deficits: Unknown as she did not return  Education / Equipment: HEP Plan:                                                    Patient goals were not met. Patient is being discharged due to not returning since the last visit.  ?????    Lillette Boxer Ronella Plunk  PT  04/29/15          2:14  PM

## 2015-03-08 ENCOUNTER — Ambulatory Visit: Payer: Medicare Other

## 2015-03-08 ENCOUNTER — Ambulatory Visit: Payer: Medicare Other | Admitting: Internal Medicine

## 2015-03-09 ENCOUNTER — Ambulatory Visit: Payer: Medicare Other | Admitting: Internal Medicine

## 2015-03-11 ENCOUNTER — Ambulatory Visit: Payer: Medicare Other | Attending: Internal Medicine | Admitting: Physical Therapy

## 2015-03-11 DIAGNOSIS — R262 Difficulty in walking, not elsewhere classified: Secondary | ICD-10-CM | POA: Diagnosis not present

## 2015-03-11 DIAGNOSIS — R6889 Other general symptoms and signs: Secondary | ICD-10-CM | POA: Insufficient documentation

## 2015-03-11 DIAGNOSIS — M25362 Other instability, left knee: Secondary | ICD-10-CM | POA: Insufficient documentation

## 2015-03-11 DIAGNOSIS — R29898 Other symptoms and signs involving the musculoskeletal system: Secondary | ICD-10-CM | POA: Insufficient documentation

## 2015-03-11 DIAGNOSIS — R2681 Unsteadiness on feet: Secondary | ICD-10-CM | POA: Insufficient documentation

## 2015-03-11 NOTE — Therapy (Addendum)
[place], [place], [address] Phone: [phone]   Fax:  [phone]  Physical Therapy Treatment  Patient Details  Name: Michelle Lopez MRN: [id number] Date of Birth: Nov 05, 1945 Referring Provider: Hulan Saas, MD  Encounter Date: 03/11/2015      PT End of Session - 03/11/15 0935    Visit Number 4   Number of Visits 12   Date for PT Re-Evaluation 04/28/15   PT Start Time 0930   PT Stop Time 1015   PT Time Calculation (min) 45 min      Past Medical History  Diagnosis Date  . Hyperlipidemia   . Diabetes mellitus     type 2, uncontrolled  . Essential hypertension, benign   . Cocaine abuse, unspecified   . Diarrhea   . Cerebrovascular disease, unspecified   . Stroke [facility])     x7    Past Surgical History  Procedure Laterality Date  . Total hip arthroplasty  1970    Dr. Rodell Perna    There were no vitals filed for this visit.  Visit Diagnosis:  No diagnosis found.      Subjective Assessment - 03/11/15 0936    Subjective I am okay. I am always dizzy. Dizzy and weak.   Currently in Pain? No/denies                         [facility] Adult PT Treatment/Exercise - 03/11/15 0001    High Level Balance   High Level Balance Activities Side stepping;Backward walking;Tandem walking;Marching forwards   High Level Balance Comments Also dynamic balance inluding narrow base with head turns and trunk rotations, staggered stance with trunk rotations, tandem static balance SLS static balance- CGA for all in parallel bars with frequent need to grab bars by patient to prevent LOB   Self-Care   Self-Care Other Self-Care Comments   Other Self-Care Comments  Donning Donjoy Brace for the knee stability.- pt's daughter brought brace in during treatment in a bag and asked if I could put it on her. Donned brace without Anitmigraton strap today- ased pt to bring the brace and bag again next visit and we we  dedicate more time to properly fitting and adjusting the brace. After brace applied pt ambulated to the lobby and c/o increased left knee pain with brace on. We took it off in the lobby due to increased pain. She also noted the brace sliding down her leg. Will add antimigration strap next visit and gait training with brace.    Knee/Hip Exercises: Aerobic   Nustep L4 LE only x 6 min   Knee/Hip Exercises: Standing   SLS 5 sec best bilateral , then 30 sec each x 2 with 1 finger support   Other Standing Knee Exercises standing 3 way hip x 10 each bilateral with pt feeling unstable placing weight on LLE- CGA throughout    Knee/Hip Exercises: Seated   Sit to Sand 10 reps;without UE support                  PT Short Term Goals - 03/11/15 1259    PT SHORT TERM GOAL #1   Title She will be independent with inital HEP   Time 3   Period Weeks   Status On-going   PT SHORT TERM GOAL #2   Title She will improve her BERG score to 40/56 to demo improved balance for safe ambulation   Time 3  Period Weeks   Status On-going           PT Long Term Goals - 02/17/15 1356    PT LONG TERM GOAL #1   Title She will be independent with all HEP as of last visit   Time 6   Period Weeks   Status New   PT LONG TERM GOAL #2   Title She will improve BERG score to 45/56 to improve safety with  walking with SPC   Time 6   Period Weeks   Status New   PT LONG TERM GOAL #3   Title She will improve LT hip strength to 4/5 to improve stability /balance with walking   Time 6   Period Weeks   Status New   PT LONG TERM GOAL #4   Title she will report greater ease with gerneral walking in community and home   Time 6   Period Weeks   Status New   PT LONG TERM GOAL #5   Title She will be able to walk 600 feet for improved community ambulation tolerance   Time 6   Period Weeks   Status New               Plan - 03/11/15 1254    Clinical Impression Statement Instructed pt in static and  dynamic balance activites in parallel bars. Pt reports decreased confidence in left knee. Her daughter brought her brace in during the session and stated that we had asked for them to bring it so we could don the brace. Donned brace without anitimigration strap and pt noted increased pain with gait and felt it was slipping down her leg so I removed it and asked her to bring it again next visit so we can spend more time adjusting it and practicing gait with it on.    PT Next Visit Plan PT TO BRING DON JOY BRACE NEXT VISIT- need to add antimigration strap so that it does not slip- pt needs education on how to don/doff. BALANCE and standiong strength.   HEP supine and seated        Problem List Patient Active Problem List   Diagnosis Date Noted  . Acute upper respiratory infection 02/25/2015  . Hematochezia 02/25/2015  . Dyspnea 02/25/2015  . Allergic reaction 12/15/2014  . Instability of left knee joint 11/02/2014  . Knee buckling 09/29/2014  . Osteopenia, senile 03/03/2014  . 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 Seborrheic dermatitis 03/05/2013  . Dysphagia as late effect of stroke 05/30/2011  . 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 Routine general medical examination at a health care facility 05/02/2011  . Hyperlipidemia with target LDL less than 70   . Visit for screening mammogram 09/15/2010  . OSTEOARTHRITIS, HIP, RIGHT 07/03/2009  . DEPRESSION 06/24/2009  . VERTIGO, CENTRAL 01/14/2009  . Insomnia 01/14/2009  . CVA (cerebral infarction) 06/11/2008  . DM (diabetes mellitus), type 2 with renal complications ([place]) [id number]  . Essential hypertension, benign 06/05/2007    Dorene Ar, PTA 03/11/2015, 1:00 PM  [facility] [address] [place], [place], [address] Phone: [phone]   Fax:  [phone]  Name: Michelle Lopez MRN: [id number] Date of Birth: 11/16/1945

## 2015-03-15 ENCOUNTER — Other Ambulatory Visit: Payer: Self-pay

## 2015-03-15 ENCOUNTER — Ambulatory Visit (HOSPITAL_COMMUNITY): Payer: Medicare Other | Attending: Cardiovascular Disease

## 2015-03-15 ENCOUNTER — Encounter: Payer: Self-pay | Admitting: Internal Medicine

## 2015-03-15 DIAGNOSIS — I351 Nonrheumatic aortic (valve) insufficiency: Secondary | ICD-10-CM | POA: Diagnosis not present

## 2015-03-15 DIAGNOSIS — R9389 Abnormal findings on diagnostic imaging of other specified body structures: Secondary | ICD-10-CM

## 2015-03-15 DIAGNOSIS — E785 Hyperlipidemia, unspecified: Secondary | ICD-10-CM | POA: Insufficient documentation

## 2015-03-15 DIAGNOSIS — R938 Abnormal findings on diagnostic imaging of other specified body structures: Secondary | ICD-10-CM

## 2015-03-15 DIAGNOSIS — R0600 Dyspnea, unspecified: Secondary | ICD-10-CM | POA: Insufficient documentation

## 2015-03-15 DIAGNOSIS — I10 Essential (primary) hypertension: Secondary | ICD-10-CM | POA: Insufficient documentation

## 2015-03-15 DIAGNOSIS — E119 Type 2 diabetes mellitus without complications: Secondary | ICD-10-CM | POA: Insufficient documentation

## 2015-03-15 DIAGNOSIS — I517 Cardiomegaly: Secondary | ICD-10-CM | POA: Diagnosis not present

## 2015-03-17 ENCOUNTER — Ambulatory Visit: Payer: Medicare Other | Admitting: Internal Medicine

## 2015-03-18 ENCOUNTER — Ambulatory Visit: Payer: Medicare Other | Admitting: Internal Medicine

## 2015-03-22 ENCOUNTER — Ambulatory Visit: Payer: Medicare Other

## 2015-03-24 ENCOUNTER — Ambulatory Visit: Payer: Medicare Other

## 2015-03-24 DIAGNOSIS — R2681 Unsteadiness on feet: Secondary | ICD-10-CM | POA: Diagnosis not present

## 2015-03-24 DIAGNOSIS — R29898 Other symptoms and signs involving the musculoskeletal system: Secondary | ICD-10-CM

## 2015-03-24 DIAGNOSIS — M25362 Other instability, left knee: Secondary | ICD-10-CM | POA: Diagnosis not present

## 2015-03-24 DIAGNOSIS — R6889 Other general symptoms and signs: Secondary | ICD-10-CM | POA: Diagnosis not present

## 2015-03-24 DIAGNOSIS — R262 Difficulty in walking, not elsewhere classified: Secondary | ICD-10-CM | POA: Diagnosis not present

## 2015-03-24 NOTE — Therapy (Addendum)
[place], [place], [address] Phone: [phone]   Fax:  [phone]  Physical Therapy Treatment  Patient Details  Name: Michelle Lopez MRN: [id number] Date of Birth: May 24, 1945 Referring Provider: Hulan Saas, MD  Encounter Date: 03/24/2015      PT End of Session - 03/24/15 1341    Visit Number 5   Number of Visits 12   Date for PT Re-Evaluation 04/28/15   PT Start Time 1158  10 min late   PT Stop Time 1230   PT Time Calculation (min) 32 min   Activity Tolerance Patient tolerated treatment well   Behavior During Therapy Michelle Lopez for tasks assessed/performed      Past Medical History  Diagnosis Date  . Hyperlipidemia   . Diabetes mellitus     type 2, uncontrolled  . Essential hypertension, benign   . Cocaine abuse, unspecified   . Diarrhea   . Cerebrovascular disease, unspecified   . Stroke [facility])     x7    Past Surgical History  Procedure Laterality Date  . Total hip arthroplasty  1970    Dr. Rodell Perna    There were no vitals filed for this visit.  Visit Diagnosis:  Difficulty walking  Weakness of left leg  Gait instability  Activity intolerance  Instability of left knee joint      Subjective Assessment - 03/24/15 1233    Subjective No pain. Brought brace to assess agian   Currently in Pain? No/denies                         [facility] Adult PT Treatment/Exercise - 03/24/15 0001    Ambulation/Gait   Gait Comments Orthotic assessment and training. With use of antimigration pads and sleev the brace staye dup well with the time she was in clinic. Without sleeve even with pads the brace slipped. After working with her she reports she lives alone and probably wont be able to apply by self. Her dauhter comes over some and she was not here as I needed to move on to next patient. I asked [name] if she could ask he daughter to stay next visit to be instructed on application  of brace. If she continues to have issues she will need to return to MD for followup for adjustment or Michelle brace                PT Education - 03/24/15 1341    Education provided Yes   Education Details applicatin of brace   Person(s) Educated Patient   Methods Explanation;Demonstration   Comprehension Need further instruction          PT Short Term Goals - 03/24/15 1344    PT SHORT TERM GOAL #1   Title She will be independent with inital HEP   Status On-going   PT SHORT TERM GOAL #2   Title She will improve her BERG score to 40/56 to demo improved balance for safe ambulation   Status Unable to assess           PT Long Term Goals - 03/24/15 1344    PT LONG TERM GOAL #1   Title She will be independent with all HEP as of last visit   Status On-going   PT LONG TERM GOAL #2   Title She will improve BERG score to 45/56 to improve safety with  walking with SPC   Status Unable to assess  PT LONG TERM GOAL #3   Title She will improve LT hip strength to 4/5 to improve stability /balance with walking   Status Unable to assess   PT LONG TERM GOAL #4   Title she will report greater ease with gerneral walking in community and home   Status On-going   PT LONG TERM GOAL #5   Title She will be able to walk 600 feet for improved community ambulation tolerance   Status On-going               Plan - 03/24/15 1342    Clinical Impression Statement It appears she may not be able to don brae without help and the brace is having trouble staying up. If daughter stays next we will instruct in application of brace , if not we will return to exercises.    PT Next Visit Plan PT TO BRING DON JOY BRACE NEXT VISIT-  pt needs education with daughter on how to don/doff. BALANCE and standiong strength.   HEP supine and seated   Consulted and Agree with Plan of Care Patient        Problem List Patient Active Problem List   Diagnosis Date Noted  . Acute upper respiratory  infection 02/25/2015  . Hematochezia 02/25/2015  . Dyspnea 02/25/2015  . Allergic reaction 12/15/2014  . Instability of left knee joint 11/02/2014  . Knee buckling 09/29/2014  . Osteopenia, senile 03/03/2014  . 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 Seborrheic dermatitis 03/05/2013  . Dysphagia as late effect of stroke 05/30/2011  . 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 Routine general medical examination at a health care facility 05/02/2011  . Hyperlipidemia with target LDL less than 70   . Visit for screening mammogram 09/15/2010  . OSTEOARTHRITIS, HIP, RIGHT 07/03/2009  . DEPRESSION 06/24/2009  . VERTIGO, CENTRAL 01/14/2009  . Insomnia 01/14/2009  . CVA (cerebral infarction) 06/11/2008  . DM (diabetes mellitus), type 2 with renal complications ([place]) 39/68/8648  . Essential hypertension, benign 06/05/2007    Darrel Hoover PT 03/24/2015, 1:45 PM  [facility] [address] [place], [place], [address] Phone: [phone]   Fax:  [phone]  Name: Michelle Lopez MRN: [id number] Date of Birth: Nov 27, 1945    PHYSICAL THERAPY DISCHARGE SUMMARY  Visits from Start of Care: 5  Current functional level related to goals / functional outcomes:  Unknown as she has not returned in a month   Remaining deficits: Unknown   Education / Equipment: HEP Plan:                                                    Patient goals were not met. Patient is being discharged due to not returning since the last visit.  ?????   Darrel Hoover, PT       04/21/15                 2PM

## 2015-03-29 ENCOUNTER — Ambulatory Visit: Payer: Medicare Other

## 2015-03-31 ENCOUNTER — Ambulatory Visit: Payer: Medicare Other

## 2015-04-05 ENCOUNTER — Ambulatory Visit: Payer: Medicare Other

## 2015-04-08 ENCOUNTER — Encounter: Payer: Medicare Other | Admitting: Physical Therapy

## 2015-04-12 ENCOUNTER — Other Ambulatory Visit: Payer: Self-pay | Admitting: Internal Medicine

## 2015-04-12 ENCOUNTER — Other Ambulatory Visit: Payer: Self-pay | Admitting: Family Medicine

## 2015-04-13 NOTE — Telephone Encounter (Signed)
Okay to refill for 3 more months per dr Tamala Julian.

## 2015-05-05 ENCOUNTER — Ambulatory Visit: Payer: Medicare Other | Admitting: Gastroenterology

## 2015-05-12 ENCOUNTER — Other Ambulatory Visit: Payer: Self-pay | Admitting: Internal Medicine

## 2015-06-03 ENCOUNTER — Other Ambulatory Visit: Payer: Self-pay | Admitting: Internal Medicine

## 2015-06-03 ENCOUNTER — Telehealth: Payer: Self-pay | Admitting: Internal Medicine

## 2015-06-03 NOTE — Telephone Encounter (Signed)
Please advise. You have not seen pt since 06/2014. Has seen other providers in office

## 2015-06-03 NOTE — Telephone Encounter (Signed)
Pt's daughter Rosaria Ferries is stating she needs another letter regarding her needing assisted living. She states they need an updated one. The other letter was written on 10/09/13.  Please call pt's daughter at [phone] when ready

## 2015-06-08 ENCOUNTER — Telehealth: Payer: Self-pay | Admitting: Internal Medicine

## 2015-06-08 NOTE — Telephone Encounter (Signed)
[name] walked in to drop off [facility] housing paperwork for patient. Sending to dahlia for completion.   She asks that we fax it completed to [phone] and call her to advise completed.

## 2015-06-08 NOTE — Telephone Encounter (Signed)
Form placed on MD's desk for signature

## 2015-06-09 NOTE — Telephone Encounter (Signed)
Paperwork signed, faxed [phone]), copy sent to scan.  Pt's EC notified of same, original placed in cabinet for pick up

## 2015-06-11 NOTE — Telephone Encounter (Signed)
Spoke to [employer]. She states that [employer] is not accepting the paperwork because they did not initiate a fax to [place]. The patient is not supposed to have contact with the paperwork. [employer] is sending new paperwork via fax. Advised that i would notate, but we have not seen it noted at this point

## 2015-06-15 NOTE — Telephone Encounter (Signed)
Paperwork received via fax, will fax tomorrow when I am back in office.

## 2015-06-16 NOTE — Telephone Encounter (Signed)
Pt called stated that place got the paper work that we fax in today but they need for the paper work to say 1st floor unit in order for the paper work to be aceptional. Pt is moving there 06/17/15 at 3 pm, please help ASAP, daughter might come up here at 9am to check up on this. Please help

## 2015-06-16 NOTE — Telephone Encounter (Signed)
Paperwork completed and re-faxed

## 2015-06-17 NOTE — Telephone Encounter (Signed)
Spoke with Rosaria Ferries, re faxed all 5 pages of paper work (confrm). She is coming up to pick up copy ( it is up front).

## 2015-06-17 NOTE — Telephone Encounter (Signed)
Paperwork has already been sent to scan.  Please advised pt/sister that in the future paperwork will need to include ALL necessary information as we sometimes cannot retrieve form once it has been sent to scan.  Also, if this information was not included on the original form by HUD, there would have been no way for our office or pt PCP to know that this was required information.  Please ask them to refax form to include all relevant information.  I have left a message on sister's VM stating same

## 2015-06-23 ENCOUNTER — Telehealth: Payer: Self-pay

## 2015-06-23 ENCOUNTER — Ambulatory Visit: Payer: Medicare Other | Admitting: Internal Medicine

## 2015-06-23 DIAGNOSIS — Z0289 Encounter for other administrative examinations: Secondary | ICD-10-CM

## 2015-06-23 NOTE — Telephone Encounter (Signed)
Pt No Show for Med F/u (30) Please Reschedule not seen since 2016 w PCP informed this appt was to keep refils going (2nd notice)

## 2015-06-24 NOTE — Telephone Encounter (Signed)
Noted thanks °

## 2015-06-24 NOTE — Telephone Encounter (Signed)
Got patient scheduled

## 2015-06-29 ENCOUNTER — Ambulatory Visit: Payer: Medicare Other | Admitting: Internal Medicine

## 2015-06-29 DIAGNOSIS — Z0289 Encounter for other administrative examinations: Secondary | ICD-10-CM

## 2015-06-30 ENCOUNTER — Telehealth: Payer: Self-pay | Admitting: Internal Medicine

## 2015-06-30 NOTE — Telephone Encounter (Signed)
Michelle Lopez williamson walked in to drop off a paper for live in care verification. This is the same as before, but for a new location. She asks that we call her when completed. Sending to dahlia for completion.

## 2015-07-01 NOTE — Telephone Encounter (Signed)
Paperwork forwarded to [employer] assistant to be completed at upcoming OV

## 2015-07-01 NOTE — Telephone Encounter (Signed)
This is on my desk. Will be given to PCP on appt date

## 2015-07-01 NOTE — Telephone Encounter (Signed)
Rosaria Ferries walked in today to check the status. Advised of the below note.

## 2015-07-06 ENCOUNTER — Other Ambulatory Visit (INDEPENDENT_AMBULATORY_CARE_PROVIDER_SITE_OTHER): Payer: Medicare Other

## 2015-07-06 ENCOUNTER — Encounter: Payer: Self-pay | Admitting: Internal Medicine

## 2015-07-06 ENCOUNTER — Ambulatory Visit (INDEPENDENT_AMBULATORY_CARE_PROVIDER_SITE_OTHER): Payer: Medicare Other | Admitting: Internal Medicine

## 2015-07-06 VITALS — BP 114/70 | HR 78 | Temp 98.1°F | Ht 60.0 in | Wt 135.0 lb

## 2015-07-06 DIAGNOSIS — M899 Disorder of bone, unspecified: Secondary | ICD-10-CM

## 2015-07-06 DIAGNOSIS — M8580 Other specified disorders of bone density and structure, unspecified site: Secondary | ICD-10-CM

## 2015-07-06 DIAGNOSIS — E781 Pure hyperglyceridemia: Secondary | ICD-10-CM

## 2015-07-06 DIAGNOSIS — Z0000 Encounter for general adult medical examination without abnormal findings: Secondary | ICD-10-CM | POA: Diagnosis not present

## 2015-07-06 DIAGNOSIS — I10 Essential (primary) hypertension: Secondary | ICD-10-CM

## 2015-07-06 DIAGNOSIS — E785 Hyperlipidemia, unspecified: Secondary | ICD-10-CM

## 2015-07-06 DIAGNOSIS — E1121 Type 2 diabetes mellitus with diabetic nephropathy: Secondary | ICD-10-CM

## 2015-07-06 DIAGNOSIS — E559 Vitamin D deficiency, unspecified: Secondary | ICD-10-CM | POA: Insufficient documentation

## 2015-07-06 DIAGNOSIS — Z1231 Encounter for screening mammogram for malignant neoplasm of breast: Secondary | ICD-10-CM

## 2015-07-06 LAB — COMPREHENSIVE METABOLIC PANEL
ALBUMIN: 4.2 g/dL (ref 3.5–5.2)
ALK PHOS: 59 U/L (ref 39–117)
ALT: 6 U/L (ref 0–35)
AST: 10 U/L (ref 0–37)
BUN: 15 mg/dL (ref 6–23)
CHLORIDE: 102 meq/L (ref 96–112)
CO2: 25 mEq/L (ref 19–32)
Calcium: 9.9 mg/dL (ref 8.4–10.5)
Creatinine, Ser: 1.19 mg/dL (ref 0.40–1.20)
GFR: 57.28 mL/min — AB (ref >60.00)
Glucose, Bld: 184 mg/dL — ABNORMAL HIGH (ref 70–99)
POTASSIUM: 3.8 meq/L (ref 3.5–5.1)
SODIUM: 139 meq/L (ref 135–145)
Total Bilirubin: 0.3 mg/dL (ref 0.2–1.2)
Total Protein: 7.7 g/dL (ref 6.0–8.3)

## 2015-07-06 LAB — LIPID PANEL
CHOLESTEROL: 183 mg/dL (ref 0–200)
HDL: 38.1 mg/dL — AB (ref >39.00)
NonHDL: 144.78
Total CHOL/HDL Ratio: 5
Triglycerides: 291 mg/dL — ABNORMAL HIGH (ref 0.0–149.0)
VLDL: 58.2 mg/dL — AB (ref 0.0–40.0)

## 2015-07-06 LAB — URINALYSIS, ROUTINE W REFLEX MICROSCOPIC
BILIRUBIN URINE: NEGATIVE
HGB URINE DIPSTICK: NEGATIVE
Ketones, ur: NEGATIVE
LEUKOCYTES UA: NEGATIVE
NITRITE: NEGATIVE
RBC / HPF: NONE SEEN (ref 0–?)
Specific Gravity, Urine: 1.01 (ref 1.000–1.030)
Total Protein, Urine: NEGATIVE
Urobilinogen, UA: 0.2 (ref 0.0–1.0)
pH: 5.5 (ref 5.0–8.0)

## 2015-07-06 LAB — CBC WITH DIFFERENTIAL/PLATELET
BASOS PCT: 1.1 % (ref 0.0–3.0)
Basophils Absolute: 0.1 10*3/uL (ref 0.0–0.1)
EOS PCT: 1.7 % (ref 0.0–5.0)
Eosinophils Absolute: 0.1 10*3/uL (ref 0.0–0.7)
HEMATOCRIT: 43.1 % (ref 36.0–46.0)
HEMOGLOBIN: 13.8 g/dL (ref 12.0–15.0)
LYMPHS PCT: 44 % (ref 12.0–46.0)
Lymphs Abs: 3.2 10*3/uL (ref 0.7–4.0)
MCHC: 32.1 g/dL (ref 30.0–36.0)
MCV: 79.6 fl (ref 78.0–100.0)
MONO ABS: 0.6 10*3/uL (ref 0.1–1.0)
MONOS PCT: 8.5 % (ref 3.0–12.0)
Neutro Abs: 3.2 10*3/uL (ref 1.4–7.7)
Neutrophils Relative %: 44.7 % (ref 43.0–77.0)
Platelets: 308 10*3/uL (ref 150.0–400.0)
RBC: 5.42 Mil/uL — AB (ref 3.87–5.11)
RDW: 13.5 % (ref 11.5–15.5)
WBC: 7.2 10*3/uL (ref 4.0–10.5)

## 2015-07-06 LAB — TSH: TSH: 2.97 u[IU]/mL (ref 0.35–4.50)

## 2015-07-06 LAB — LDL CHOLESTEROL, DIRECT: Direct LDL: 100 mg/dL

## 2015-07-06 LAB — MICROALBUMIN / CREATININE URINE RATIO
Creatinine,U: 121.4 mg/dL
Microalb Creat Ratio: 0.6 mg/g (ref 0.0–30.0)

## 2015-07-06 LAB — VITAMIN D 25 HYDROXY (VIT D DEFICIENCY, FRACTURES): VITD: 62.21 ng/mL (ref 30.00–100.00)

## 2015-07-06 LAB — HEMOGLOBIN A1C: Hgb A1c MFr Bld: 7.6 % — ABNORMAL HIGH (ref 4.6–6.5)

## 2015-07-06 MED ORDER — INSULIN GLARGINE 300 UNIT/ML ~~LOC~~ SOPN: 20.0000 [IU] | PEN_INJECTOR | Freq: Every day | SUBCUTANEOUS | Status: DC

## 2015-07-06 NOTE — Patient Instructions (Signed)
Preventive Care for Adults, Female A healthy lifestyle and preventive care can promote health and wellness. Preventive health guidelines for women include the following key practices.  A routine yearly physical is a good way to check with your health care provider about your health and preventive screening. It is a chance to share any concerns and updates on your health and to receive a thorough exam.  Visit your dentist for a routine exam and preventive care every 6 months. Brush your teeth twice a day and floss once a day. Good oral hygiene prevents tooth decay and gum disease.  The frequency of eye exams is based on your age, health, family medical history, use of contact lenses, and other factors. Follow your health care provider's recommendations for frequency of eye exams.  Eat a healthy diet. Foods like vegetables, fruits, whole grains, low-fat dairy products, and lean protein foods contain the nutrients you need without too many calories. Decrease your intake of foods high in solid fats, added sugars, and salt. Eat the right amount of calories for you.Get information about a proper diet from your health care provider, if necessary.  Regular physical exercise is one of the most important things you can do for your health. Most adults should get at least 150 minutes of moderate-intensity exercise (any activity that increases your heart rate and causes you to sweat) each week. In addition, most adults need muscle-strengthening exercises on 2 or more days a week.  Maintain a healthy weight. The body mass index (BMI) is a screening tool to identify possible weight problems. It provides an estimate of body fat based on height and weight. Your health care provider can find your BMI and can help you achieve or maintain a healthy weight.For adults 20 years and older:  A BMI below 18.5 is considered underweight.  A BMI of 18.5 to 24.9 is normal.  A BMI of 25 to 29.9 is considered overweight.  A  BMI of 30 and above is considered obese.  Maintain normal blood lipids and cholesterol levels by exercising and minimizing your intake of saturated fat. Eat a balanced diet with plenty of fruit and vegetables. Blood tests for lipids and cholesterol should begin at age 45 and be repeated every 5 years. If your lipid or cholesterol levels are high, you are over 50, or you are at high risk for heart disease, you may need your cholesterol levels checked more frequently.Ongoing high lipid and cholesterol levels should be treated with medicines if diet and exercise are not working.  If you smoke, find out from your health care provider how to quit. If you do not use tobacco, do not start.  Lung cancer screening is recommended for adults aged 45-80 years who are at high risk for developing lung cancer because of a history of smoking. A yearly low-dose CT scan of the lungs is recommended for people who have at least a 30-pack-year history of smoking and are a current smoker or have quit within the past 15 years. A pack year of smoking is smoking an average of 1 pack of cigarettes a day for 1 year (for example: 1 pack a day for 30 years or 2 packs a day for 15 years). Yearly screening should continue until the smoker has stopped smoking for at least 15 years. Yearly screening should be stopped for people who develop a health problem that would prevent them from having lung cancer treatment.  If you are pregnant, do not drink alcohol. If you are  breastfeeding, be very cautious about drinking alcohol. If you are not pregnant and choose to drink alcohol, do not have more than 1 drink per day. One drink is considered to be 12 ounces (355 mL) of beer, 5 ounces (148 mL) of wine, or 1.5 ounces (44 mL) of liquor.  Avoid use of street drugs. Do not share needles with anyone. Ask for help if you need support or instructions about stopping the use of drugs.  High blood pressure causes heart disease and increases the risk  of stroke. Your blood pressure should be checked at least every 1 to 2 years. Ongoing high blood pressure should be treated with medicines if weight loss and exercise do not work.  If you are 55-79 years old, ask your health care provider if you should take aspirin to prevent strokes.  Diabetes screening is done by taking a blood sample to check your blood glucose level after you have not eaten for a certain period of time (fasting). If you are not overweight and you do not have risk factors for diabetes, you should be screened once every 3 years starting at age 45. If you are overweight or obese and you are 40-70 years of age, you should be screened for diabetes every year as part of your cardiovascular risk assessment.  Breast cancer screening is essential preventive care for women. You should practice "breast self-awareness." This means understanding the normal appearance and feel of your breasts and may include breast self-examination. Any changes detected, no matter how small, should be reported to a health care provider. Women in their 20s and 30s should have a clinical breast exam (CBE) by a health care provider as part of a regular health exam every 1 to 3 years. After age 40, women should have a CBE every year. Starting at age 40, women should consider having a mammogram (breast X-ray test) every year. Women who have a family history of breast cancer should talk to their health care provider about genetic screening. Women at a high risk of breast cancer should talk to their health care providers about having an MRI and a mammogram every year.  Breast cancer gene (BRCA)-related cancer risk assessment is recommended for women who have family members with BRCA-related cancers. BRCA-related cancers include breast, ovarian, tubal, and peritoneal cancers. Having family members with these cancers may be associated with an increased risk for harmful changes (mutations) in the breast cancer genes BRCA1 and  BRCA2. Results of the assessment will determine the need for genetic counseling and BRCA1 and BRCA2 testing.  Your health care provider may recommend that you be screened regularly for cancer of the pelvic organs (ovaries, uterus, and vagina). This screening involves a pelvic examination, including checking for microscopic changes to the surface of your cervix (Pap test). You may be encouraged to have this screening done every 3 years, beginning at age 21.  For women ages 30-65, health care providers may recommend pelvic exams and Pap testing every 3 years, or they may recommend the Pap and pelvic exam, combined with testing for human papilloma virus (HPV), every 5 years. Some types of HPV increase your risk of cervical cancer. Testing for HPV may also be done on women of any age with unclear Pap test results.  Other health care providers may not recommend any screening for nonpregnant women who are considered low risk for pelvic cancer and who do not have symptoms. Ask your health care provider if a screening pelvic exam is right for   you.  If you have had past treatment for cervical cancer or a condition that could lead to cancer, you need Pap tests and screening for cancer for at least 20 years after your treatment. If Pap tests have been discontinued, your risk factors (such as having a new sexual partner) need to be reassessed to determine if screening should resume. Some women have medical problems that increase the chance of getting cervical cancer. In these cases, your health care provider may recommend more frequent screening and Pap tests.  Colorectal cancer can be detected and often prevented. Most routine colorectal cancer screening begins at the age of 50 years and continues through age 75 years. However, your health care provider may recommend screening at an earlier age if you have risk factors for colon cancer. On a yearly basis, your health care provider may provide home test kits to check  for hidden blood in the stool. Use of a small camera at the end of a tube, to directly examine the colon (sigmoidoscopy or colonoscopy), can detect the earliest forms of colorectal cancer. Talk to your health care provider about this at age 50, when routine screening begins. Direct exam of the colon should be repeated every 5-10 years through age 75 years, unless early forms of precancerous polyps or small growths are found.  People who are at an increased risk for hepatitis B should be screened for this virus. You are considered at high risk for hepatitis B if:  You were born in a country where hepatitis B occurs often. Talk with your health care provider about which countries are considered high risk.  Your parents were born in a high-risk country and you have not received a shot to protect against hepatitis B (hepatitis B vaccine).  You have HIV or AIDS.  You use needles to inject street drugs.  You live with, or have sex with, someone who has hepatitis B.  You get hemodialysis treatment.  You take certain medicines for conditions like cancer, organ transplantation, and autoimmune conditions.  Hepatitis C blood testing is recommended for all people born from 1945 through 1965 and any individual with known risks for hepatitis C.  Practice safe sex. Use condoms and avoid high-risk sexual practices to reduce the spread of sexually transmitted infections (STIs). STIs include gonorrhea, chlamydia, syphilis, trichomonas, herpes, HPV, and human immunodeficiency virus (HIV). Herpes, HIV, and HPV are viral illnesses that have no cure. They can result in disability, cancer, and death.  You should be screened for sexually transmitted illnesses (STIs) including gonorrhea and chlamydia if:  You are sexually active and are younger than 24 years.  You are older than 24 years and your health care provider tells you that you are at risk for this type of infection.  Your sexual activity has changed  since you were last screened and you are at an increased risk for chlamydia or gonorrhea. Ask your health care provider if you are at risk.  If you are at risk of being infected with HIV, it is recommended that you take a prescription medicine daily to prevent HIV infection. This is called preexposure prophylaxis (PrEP). You are considered at risk if:  You are sexually active and do not regularly use condoms or know the HIV status of your partner(s).  You take drugs by injection.  You are sexually active with a partner who has HIV.  Talk with your health care provider about whether you are at high risk of being infected with HIV. If   you choose to begin PrEP, you should first be tested for HIV. You should then be tested every 3 months for as long as you are taking PrEP.  Osteoporosis is a disease in which the bones lose minerals and strength with aging. This can result in serious bone fractures or breaks. The risk of osteoporosis can be identified using a bone density scan. Women ages 67 years and over and women at risk for fractures or osteoporosis should discuss screening with their health care providers. Ask your health care provider whether you should take a calcium supplement or vitamin D to reduce the rate of osteoporosis.  Menopause can be associated with physical symptoms and risks. Hormone replacement therapy is available to decrease symptoms and risks. You should talk to your health care provider about whether hormone replacement therapy is right for you.  Use sunscreen. Apply sunscreen liberally and repeatedly throughout the day. You should seek shade when your shadow is shorter than you. Protect yourself by wearing long sleeves, pants, a wide-brimmed hat, and sunglasses year round, whenever you are outdoors.  Once a month, do a whole body skin exam, using a mirror to look at the skin on your back. Tell your health care provider of new moles, moles that have irregular borders, moles that  are larger than a pencil eraser, or moles that have changed in shape or color.  Stay current with required vaccines (immunizations).  Influenza vaccine. All adults should be immunized every year.  Tetanus, diphtheria, and acellular pertussis (Td, Tdap) vaccine. Pregnant women should receive 1 dose of Tdap vaccine during each pregnancy. The dose should be obtained regardless of the length of time since the last dose. Immunization is preferred during the 27th-36th week of gestation. An adult who has not previously received Tdap or who does not know her vaccine status should receive 1 dose of Tdap. This initial dose should be followed by tetanus and diphtheria toxoids (Td) booster doses every 10 years. Adults with an unknown or incomplete history of completing a 3-dose immunization series with Td-containing vaccines should begin or complete a primary immunization series including a Tdap dose. Adults should receive a Td booster every 10 years.  Varicella vaccine. An adult without evidence of immunity to varicella should receive 2 doses or a second dose if she has previously received 1 dose. Pregnant females who do not have evidence of immunity should receive the first dose after pregnancy. This first dose should be obtained before leaving the health care facility. The second dose should be obtained 4-8 weeks after the first dose.  Human papillomavirus (HPV) vaccine. Females aged 13-26 years who have not received the vaccine previously should obtain the 3-dose series. The vaccine is not recommended for use in pregnant females. However, pregnancy testing is not needed before receiving a dose. If a female is found to be pregnant after receiving a dose, no treatment is needed. In that case, the remaining doses should be delayed until after the pregnancy. Immunization is recommended for any person with an immunocompromised condition through the age of 61 years if she did not get any or all doses earlier. During the  3-dose series, the second dose should be obtained 4-8 weeks after the first dose. The third dose should be obtained 24 weeks after the first dose and 16 weeks after the second dose.  Zoster vaccine. One dose is recommended for adults aged 30 years or older unless certain conditions are present.  Measles, mumps, and rubella (MMR) vaccine. Adults born  before 1957 generally are considered immune to measles and mumps. Adults born in 1957 or later should have 1 or more doses of MMR vaccine unless there is a contraindication to the vaccine or there is laboratory evidence of immunity to each of the three diseases. A routine second dose of MMR vaccine should be obtained at least 28 days after the first dose for students attending postsecondary schools, health care workers, or international travelers. People who received inactivated measles vaccine or an unknown type of measles vaccine during 1963-1967 should receive 2 doses of MMR vaccine. People who received inactivated mumps vaccine or an unknown type of mumps vaccine before 1979 and are at high risk for mumps infection should consider immunization with 2 doses of MMR vaccine. For females of childbearing age, rubella immunity should be determined. If there is no evidence of immunity, females who are not pregnant should be vaccinated. If there is no evidence of immunity, females who are pregnant should delay immunization until after pregnancy. Unvaccinated health care workers born before 1957 who lack laboratory evidence of measles, mumps, or rubella immunity or laboratory confirmation of disease should consider measles and mumps immunization with 2 doses of MMR vaccine or rubella immunization with 1 dose of MMR vaccine.  Pneumococcal 13-valent conjugate (PCV13) vaccine. When indicated, a person who is uncertain of his immunization history and has no record of immunization should receive the PCV13 vaccine. All adults 65 years of age and older should receive this  vaccine. An adult aged 19 years or older who has certain medical conditions and has not been previously immunized should receive 1 dose of PCV13 vaccine. This PCV13 should be followed with a dose of pneumococcal polysaccharide (PPSV23) vaccine. Adults who are at high risk for pneumococcal disease should obtain the PPSV23 vaccine at least 8 weeks after the dose of PCV13 vaccine. Adults older than 72 years of age who have normal immune system function should obtain the PPSV23 vaccine dose at least 1 year after the dose of PCV13 vaccine.  Pneumococcal polysaccharide (PPSV23) vaccine. When PCV13 is also indicated, PCV13 should be obtained first. All adults aged 65 years and older should be immunized. An adult younger than age 65 years who has certain medical conditions should be immunized. Any person who resides in a nursing home or long-term care facility should be immunized. An adult smoker should be immunized. People with an immunocompromised condition and certain other conditions should receive both PCV13 and PPSV23 vaccines. People with human immunodeficiency virus (HIV) infection should be immunized as soon as possible after diagnosis. Immunization during chemotherapy or radiation therapy should be avoided. Routine use of PPSV23 vaccine is not recommended for American Indians, [place] Natives, or people younger than 65 years unless there are medical conditions that require PPSV23 vaccine. When indicated, people who have unknown immunization and have no record of immunization should receive PPSV23 vaccine. One-time revaccination 5 years after the first dose of PPSV23 is recommended for people aged 19-64 years who have chronic kidney failure, nephrotic syndrome, asplenia, or immunocompromised conditions. People who received 1-2 doses of PPSV23 before age 65 years should receive another dose of PPSV23 vaccine at age 65 years or later if at least 5 years have passed since the previous dose. Doses of PPSV23 are not  needed for people immunized with PPSV23 at or after age 65 years.  Meningococcal vaccine. Adults with asplenia or persistent complement component deficiencies should receive 2 doses of quadrivalent meningococcal conjugate (MenACWY-D) vaccine. The doses should be obtained   at least 2 months apart. Microbiologists working with certain meningococcal bacteria, [place] recruits, people at risk during an outbreak, and people who travel to or live in countries with a high rate of meningitis should be immunized. A first-year college student up through age 34 years who is living in a residence hall should receive a dose if she did not receive a dose on or after her 16th birthday. Adults who have certain high-risk conditions should receive one or more doses of vaccine.  Hepatitis A vaccine. Adults who wish to be protected from this disease, have certain high-risk conditions, work with hepatitis A-infected animals, work in hepatitis A research labs, or travel to or work in countries with a high rate of hepatitis A should be immunized. Adults who were previously unvaccinated and who anticipate close contact with an international adoptee during the first 60 days after arrival in the [place] from a country with a high rate of hepatitis A should be immunized.  Hepatitis B vaccine. Adults who wish to be protected from this disease, have certain high-risk conditions, may be exposed to blood or other infectious body fluids, are household contacts or sex partners of hepatitis B positive people, are clients or workers in certain care facilities, or travel to or work in countries with a high rate of hepatitis B should be immunized.  Haemophilus influenzae type b (Hib) vaccine. A previously unvaccinated person with asplenia or sickle cell disease or having a scheduled splenectomy should receive 1 dose of Hib vaccine. Regardless of previous immunization, a recipient of a hematopoietic stem cell transplant should receive a  3-dose series 6-12 months after her successful transplant. Hib vaccine is not recommended for adults with HIV infection. Preventive Services / Frequency Ages 35 to 4 years  Blood pressure check.** / Every 3-5 years.  Lipid and cholesterol check.** / Every 5 years beginning at age 60.  Clinical breast exam.** / Every 3 years for women in their 71s and 10s.  BRCA-related cancer risk assessment.** / For women who have family members with a BRCA-related cancer (breast, ovarian, tubal, or peritoneal cancers).  Pap test.** / Every 2 years from ages 76 through 26. Every 3 years starting at age 61 through age 76 or 93 with a history of 3 consecutive normal Pap tests.  HPV screening.** / Every 3 years from ages 37 through ages 60 to 51 with a history of 3 consecutive normal Pap tests.  Hepatitis C blood test.** / For any individual with known risks for hepatitis C.  Skin self-exam. / Monthly.  Influenza vaccine. / Every year.  Tetanus, diphtheria, and acellular pertussis (Tdap, Td) vaccine.** / Consult your health care provider. Pregnant women should receive 1 dose of Tdap vaccine during each pregnancy. 1 dose of Td every 10 years.  Varicella vaccine.** / Consult your health care provider. Pregnant females who do not have evidence of immunity should receive the first dose after pregnancy.  HPV vaccine. / 3 doses over 6 months, if 93 and younger. The vaccine is not recommended for use in pregnant females. However, pregnancy testing is not needed before receiving a dose.  Measles, mumps, rubella (MMR) vaccine.** / You need at least 1 dose of MMR if you were born in 1957 or later. You may also need a 2nd dose. For females of childbearing age, rubella immunity should be determined. If there is no evidence of immunity, females who are not pregnant should be vaccinated. If there is no evidence of immunity, females who are  pregnant should delay immunization until after pregnancy.  Pneumococcal  13-valent conjugate (PCV13) vaccine.** / Consult your health care provider.  Pneumococcal polysaccharide (PPSV23) vaccine.** / 1 to 2 doses if you smoke cigarettes or if you have certain conditions.  Meningococcal vaccine.** / 1 dose if you are age 68 to 8 years and a Market researcher living in a residence hall, or have one of several medical conditions, you need to get vaccinated against meningococcal disease. You may also need additional booster doses.  Hepatitis A vaccine.** / Consult your health care provider.  Hepatitis B vaccine.** / Consult your health care provider.  Haemophilus influenzae type b (Hib) vaccine.** / Consult your health care provider. Ages 7 to 53 years  Blood pressure check.** / Every year.  Lipid and cholesterol check.** / Every 5 years beginning at age 25 years.  Lung cancer screening. / Every year if you are aged 11-80 years and have a 30-pack-year history of smoking and currently smoke or have quit within the past 15 years. Yearly screening is stopped once you have quit smoking for at least 15 years or develop a health problem that would prevent you from having lung cancer treatment.  Clinical breast exam.** / Every year after age 48 years.  BRCA-related cancer risk assessment.** / For women who have family members with a BRCA-related cancer (breast, ovarian, tubal, or peritoneal cancers).  Mammogram.** / Every year beginning at age 41 years and continuing for as long as you are in good health. Consult with your health care provider.  Pap test.** / Every 3 years starting at age 65 years through age 37 or 70 years with a history of 3 consecutive normal Pap tests.  HPV screening.** / Every 3 years from ages 72 years through ages 60 to 40 years with a history of 3 consecutive normal Pap tests.  Fecal occult blood test (FOBT) of stool. / Every year beginning at age 21 years and continuing until age 5 years. You may not need to do this test if you get  a colonoscopy every 10 years.  Flexible sigmoidoscopy or colonoscopy.** / Every 5 years for a flexible sigmoidoscopy or every 10 years for a colonoscopy beginning at age 35 years and continuing until age 48 years.  Hepatitis C blood test.** / For all people born from [birth year] through 1965 and any individual with known risks for hepatitis C.  Skin self-exam. / Monthly.  Influenza vaccine. / Every year.  Tetanus, diphtheria, and acellular pertussis (Tdap/Td) vaccine.** / Consult your health care provider. Pregnant women should receive 1 dose of Tdap vaccine during each pregnancy. 1 dose of Td every 10 years.  Varicella vaccine.** / Consult your health care provider. Pregnant females who do not have evidence of immunity should receive the first dose after pregnancy.  Zoster vaccine.** / 1 dose for adults aged 30 years or older.  Measles, mumps, rubella (MMR) vaccine.** / You need at least 1 dose of MMR if you were born in 1957 or later. You may also need a second dose. For females of childbearing age, rubella immunity should be determined. If there is no evidence of immunity, females who are not pregnant should be vaccinated. If there is no evidence of immunity, females who are pregnant should delay immunization until after pregnancy.  Pneumococcal 13-valent conjugate (PCV13) vaccine.** / Consult your health care provider.  Pneumococcal polysaccharide (PPSV23) vaccine.** / 1 to 2 doses if you smoke cigarettes or if you have certain conditions.  Meningococcal vaccine.** /  Consult your health care provider.  Hepatitis A vaccine.** / Consult your health care provider.  Hepatitis B vaccine.** / Consult your health care provider.  Haemophilus influenzae type b (Hib) vaccine.** / Consult your health care provider. Ages 64 years and over  Blood pressure check.** / Every year.  Lipid and cholesterol check.** / Every 5 years beginning at age 23 years.  Lung cancer screening. / Every year if you  are aged 16-80 years and have a 30-pack-year history of smoking and currently smoke or have quit within the past 15 years. Yearly screening is stopped once you have quit smoking for at least 15 years or develop a health problem that would prevent you from having lung cancer treatment.  Clinical breast exam.** / Every year after age 74 years.  BRCA-related cancer risk assessment.** / For women who have family members with a BRCA-related cancer (breast, ovarian, tubal, or peritoneal cancers).  Mammogram.** / Every year beginning at age 44 years and continuing for as long as you are in good health. Consult with your health care provider.  Pap test.** / Every 3 years starting at age 58 years through age 22 or 39 years with 3 consecutive normal Pap tests. Testing can be stopped between 65 and 70 years with 3 consecutive normal Pap tests and no abnormal Pap or HPV tests in the past 10 years.  HPV screening.** / Every 3 years from ages 64 years through ages 70 or 61 years with a history of 3 consecutive normal Pap tests. Testing can be stopped between 65 and 70 years with 3 consecutive normal Pap tests and no abnormal Pap or HPV tests in the past 10 years.  Fecal occult blood test (FOBT) of stool. / Every year beginning at age 40 years and continuing until age 27 years. You may not need to do this test if you get a colonoscopy every 10 years.  Flexible sigmoidoscopy or colonoscopy.** / Every 5 years for a flexible sigmoidoscopy or every 10 years for a colonoscopy beginning at age 7 years and continuing until age 32 years.  Hepatitis C blood test.** / For all people born from [birth year] through 1967 and any individual with known risks for hepatitis C.  Osteoporosis screening.** / A one-time screening for women ages 30 years and over and women at risk for fractures or osteoporosis.  Skin self-exam. / Monthly.  Influenza vaccine. / Every year.  Tetanus, diphtheria, and acellular pertussis (Tdap/Td)  vaccine.** / 1 dose of Td every 10 years.  Varicella vaccine.** / Consult your health care provider.  Zoster vaccine.** / 1 dose for adults aged 35 years or older.  Pneumococcal 13-valent conjugate (PCV13) vaccine.** / Consult your health care provider.  Pneumococcal polysaccharide (PPSV23) vaccine.** / 1 dose for all adults aged 46 years and older.  Meningococcal vaccine.** / Consult your health care provider.  Hepatitis A vaccine.** / Consult your health care provider.  Hepatitis B vaccine.** / Consult your health care provider.  Haemophilus influenzae type b (Hib) vaccine.** / Consult your health care provider. ** Family history and personal history of risk and conditions may change your health care provider's recommendations.   This information is not intended to replace advice given to you by your health care provider. Make sure you discuss any questions you have with your health care provider.   Document Released: 03/21/2001 Document Revised: 02/13/2014 Document Reviewed: 06/20/2010 Elsevier Interactive Patient Education [employer].

## 2015-07-06 NOTE — Progress Notes (Signed)
Subjective:  Patient ID: Michelle Lopez, female    DOB: 1946-02-04  Age: 70 y.o. MRN: [id number]  CC: Hypertension; Hyperlipidemia; Diabetes; and Annual Exam   HPI Michelle Lopez presents for a CPX.  She is concerned that her blood sugar is not well controlled because she has had recent episodes of polydipsia and polyuria. She tells me that she has been taking her usual medications and has no side effects.  She continues to struggle with dizziness that has been present for many years and she says that over the last year to and is gradually worsened. She denies any new areas of numbness/weakness/tingling. She walks slowly and uses a cane but has not had any recent falls or episodes of ataxia.     Past Medical History  Diagnosis Date  . Hyperlipidemia   . Diabetes mellitus     type 2, uncontrolled  . Essential hypertension, benign   . Cocaine abuse, unspecified   . Diarrhea   . Cerebrovascular disease, unspecified   . Stroke [facility])     x7   Past Surgical History  Procedure Laterality Date  . Total hip arthroplasty  1970    Dr. Rodell Perna    reports that she has never smoked. She has never used smokeless tobacco. She reports that she does not drink alcohol or use illicit drugs. family history is negative for Colon cancer. Allergies  Allergen Reactions  . Crestor [Rosuvastatin Calcium]     myalgias  . Morphine Hives  . Pravastatin Sodium Rash  . Simvastatin Rash    Outpatient Prescriptions Prior to Visit  Medication Sig Dispense Refill  . aspirin 81 MG tablet Take 81 mg by mouth daily.      Marland Kitchen atorvastatin (LIPITOR) 20 MG tablet Take 1 tablet (20 mg total) by mouth daily. 90 tablet 3  . Canagliflozin (INVOKANA) 300 MG TABS Take 1 tablet (300 mg total) by mouth daily. 90 tablet 3  . cetirizine (ZYRTEC) 10 MG tablet TAKE 1 TABLET BY MOUTH ONCE DAILY 30 tablet 2  . clopidogrel (PLAVIX) 75 MG tablet Take 1 tablet (75 mg total) by mouth daily. 90 tablet 3  . ezetimibe (ZETIA)  10 MG tablet Take 1 tablet (10 mg total) by mouth daily. 90 tablet 3  . furosemide (LASIX) 20 MG tablet Take 1 tablet (20 mg total) by mouth daily. 30 tablet 11  . glucose blood test strip Test up to TID dx: 250.42 100 each 12  . INVOKANA 300 MG TABS tablet TAKE 1 TABLET BY MOUTH EVERY DAY 90 tablet 0  . linagliptin (TRADJENTA) 5 MG TABS tablet Take 1 tablet (5 mg total) by mouth daily. 90 tablet 3  . losartan (COZAAR) 100 MG tablet Take 1 tablet (100 mg total) by mouth daily. 90 tablet 3  . metFORMIN (GLUCOPHAGE) 1000 MG tablet TAKE 1 TABLET BY MOUTH TWICE DAILY WITH A MEAL 180 tablet 0  . metoprolol tartrate (LOPRESSOR) 25 MG tablet TAKE 1/2 TABLET BY MOUTH TWICE DAILY 180 tablet 0  . tamsulosin (FLOMAX) 0.4 MG CAPS capsule Take 1 capsule (0.4 mg total) by mouth daily. 90 capsule 3  . TRADJENTA 5 MG TABS tablet TAKE 1 TABLET BY MOUTH ONCE DAILY 90 tablet 0  . Vitamin D, Ergocalciferol, (DRISDOL) 50000 units CAPS capsule TAKE 1 CAPSULE BY MOUTH EVERY 7 DAYS 12 capsule 0  . desonide (DESOWEN) 0.05 % lotion Apply topically 2 (two) times daily. 59 mL 2  . SILENOR 6 MG TABS TAKE 1 TABLET BY  MOUTH EVERY NIGHT AT BEDTIME AS NEEDED 90 tablet 0  . Blood Glucose Monitoring Suppl ([place]) W/DEVICE KIT Test up to TID dx: 250.42 (Patient not taking: Reported on 07/06/2015) 1 each 1  . ONE TOUCH LANCETS MISC Test up to TID dx: 250.42 (Patient not taking: Reported on 07/06/2015) 200 each 11  . PRODIGY TWIST TOP LANCETS 28G MISC USE TO CHECK BLOOD SUGAR TWICE DAILY (Patient not taking: Reported on 07/06/2015) 100 each PRN  . cetirizine (ZYRTEC) 10 MG tablet Take 1 tablet (10 mg total) by mouth 2 (two) times daily. 30 tablet 0  . ketoconazole (NIZORAL) 2 % cream Apply 1 application topically 2 (two) times daily. (Patient not taking: Reported on 07/06/2015) 60 g 2  . losartan (COZAAR) 100 MG tablet TAKE 1 TABLET BY MOUTH ONCE DAILY 90 tablet 0  . metFORMIN (GLUCOPHAGE) 1000 MG tablet Take 1 tablet  (1,000 mg total) by mouth 2 (two) times daily with a meal. 180 tablet 3  . predniSONE (STERAPRED UNI-PAK 21 TAB) 10 MG (21) TBPK tablet Take 6 tablets x 1 day, 5 tablets x 1 day, 4 tablets x 1 day, 3 tablets x 1 day, 2 tablets x 1 day, 1 tablet x 1 day 21 tablet 0  . tamsulosin (FLOMAX) 0.4 MG CAPS capsule TAKE 1 CAPSULE BY MOUTH EVERY DAY 90 capsule 1   No facility-administered medications prior to visit.    ROS Review of Systems  Constitutional: Negative for fever, chills, diaphoresis, appetite change and fatigue.  HENT: Negative.  Negative for sore throat and trouble swallowing.   Eyes: Negative.  Negative for visual disturbance.  Respiratory: Negative.  Negative for cough, choking, chest tightness, shortness of breath and stridor.   Cardiovascular: Negative.  Negative for chest pain, palpitations and leg swelling.  Gastrointestinal: Negative.  Negative for nausea, vomiting, abdominal pain, diarrhea, constipation and blood in stool.  Endocrine: Positive for polydipsia and polyuria. Negative for polyphagia.  Genitourinary: Negative.  Negative for dysuria, urgency, hematuria, decreased urine volume, enuresis, difficulty urinating and genital sores.  Musculoskeletal: Positive for gait problem. Negative for myalgias, back pain, arthralgias and neck pain.  Skin: Negative.  Negative for color change and rash.  Allergic/Immunologic: Negative.   Neurological: Positive for dizziness, weakness and numbness. Negative for tremors, seizures, syncope, facial asymmetry, speech difficulty, light-headedness and headaches.       She has no new neurological complaints  Hematological: Negative.  Negative for adenopathy. Does not bruise/bleed easily.  Psychiatric/Behavioral: Negative.     Objective:  BP 114/70 mmHg  Pulse 78  Temp(Src) 98.1 F (36.7 C) (Oral)  Ht 5' (1.524 m)  Wt 135 lb (61.236 kg)  BMI 26.37 kg/m2  SpO2 93%  BP Readings from Last 3 Encounters:  07/06/15 114/70  02/25/15 158/80    01/28/15 128/72    Wt Readings from Last 3 Encounters:  07/06/15 135 lb (61.236 kg)  02/25/15 142 lb (64.411 kg)  01/28/15 142 lb (64.411 kg)    Physical Exam  Constitutional: She is oriented to person, place, and time. No distress.  HENT:  Mouth/Throat: Oropharynx is clear and moist. No oropharyngeal exudate.  Eyes: Conjunctivae are normal. Right eye exhibits no discharge. Left eye exhibits no discharge. No scleral icterus.  Neck: Normal range of motion. Neck supple. No JVD present. No tracheal deviation present. No thyromegaly present.  Cardiovascular: Normal rate, regular rhythm, normal heart sounds and intact distal pulses.  Exam reveals no gallop and no friction rub.   No  murmur heard. Pulmonary/Chest: Effort normal and breath sounds normal. No stridor. No respiratory distress. She has no wheezes. She has no rales. She exhibits no tenderness.  Abdominal: Soft. Bowel sounds are normal. She exhibits no distension and no mass. There is no tenderness. There is no rebound and no guarding.  Musculoskeletal: Normal range of motion. She exhibits no edema or tenderness.  Lymphadenopathy:    She has no cervical adenopathy.  Neurological: She is oriented to person, place, and time.  Skin: Skin is warm and dry. No rash noted. She is not diaphoretic. No erythema. No pallor.  Vitals reviewed.   Lab Results  Component Value Date   WBC 7.2 07/06/2015   HGB 13.8 07/06/2015   HCT 43.1 07/06/2015   PLT 308.0 07/06/2015   GLUCOSE 184* 07/06/2015   CHOL 183 07/06/2015   TRIG 291.0* 07/06/2015   HDL 38.10* 07/06/2015   LDLDIRECT 100.0 07/06/2015   LDLCALC 110* 06/23/2014   ALT 6 07/06/2015   AST 10 07/06/2015   NA 139 07/06/2015   K 3.8 07/06/2015   CL 102 07/06/2015   CREATININE 1.19 07/06/2015   BUN 15 07/06/2015   CO2 25 07/06/2015   TSH 2.97 07/06/2015   INR 1.1* 02/25/2015   HGBA1C 7.6* 07/06/2015   MICROALBUR <0.7 07/06/2015    Dg Chest 2 View  02/25/2015  CLINICAL DATA:   Shortness of breath. EXAM: CHEST  2 VIEW COMPARISON:  03/18/2013. FINDINGS: Mediastinum hilar structures normal. Cardiomegaly with mild bilateral from interstitial prominence. Congestive heart failure cannot be excluded. Interstitial pneumonitis cannot be excluded. No prominent pleural effusion. No pneumothorax. No acute bony abnormality . IMPRESSION: Mild cardiomegaly with mild bilateral pulmonary interstitial prominence. Mild congestive heart failure cannot be excluded . Electronically Signed   By: Marcello Moores  Register   On: 02/25/2015 15:35    Assessment & Plan:   [place] was seen today for hypertension, hyperlipidemia, diabetes and annual exam.  Diagnoses and all orders for this visit:  Essential hypertension, benign- Her blood pressures adequately well-controlled, electrolytes and renal function are stable. -     TSH; Future -     Urinalysis, Routine w reflex microscopic (not at [facility]); Future -     Comprehensive metabolic panel; Future -     CBC with Differential/Platelet; Future  Type 2 diabetes mellitus with diabetic nephropathy, without long-term current use of insulin ([place])- her A1c is up to 7.6% and she is symptomatic, I've asked her to start a daily dose of basal insulin, I've also asked her to be seen for diabetic education and to have her annual diabetic eye exam performed. -     Microalbumin / creatinine urine ratio; Future -     Comprehensive metabolic panel; Future -     Hemoglobin A1c; Future -     Ambulatory referral to Ophthalmology -     Amb Referral to Nutrition and Diabetic E -     Insulin Glargine (TOUJEO SOLOSTAR) 300 UNIT/ML SOPN; Inject 20 Units into the skin daily.  Hyperlipidemia with target LDL less than 70- she has not achieved her LDL goal but is also not willing to make any changes in her medications at this time, will continue to follow -     Lipid panel; Future -     TSH; Future -     Comprehensive metabolic panel; Future  Visit for screening mammogram -      MM DIGITAL SCREENING BILATERAL; Future  Osteopenia, senile -     VITAMIN D 25  Hydroxy (Vit-D Deficiency, Fractures); Future  Vitamin D deficiency -     VITAMIN D 25 Hydroxy (Vit-D Deficiency, Fractures); Future  Hypertriglyceridemia- her triglycerides up to 291, she is starting insulin for poorly controlled diabetes and I anticipate that this will also help lower her triglyceride level. -     Insulin Glargine (TOUJEO SOLOSTAR) 300 UNIT/ML SOPN; Inject 20 Units into the skin daily.   I have discontinued Ms. Todt's ketoconazole, desonide, predniSONE, and SILENOR. I am also having her start on Insulin Glargine. Additionally, I am having her maintain her aspirin, PRODIGY TWIST TOP LANCETS 28G, losartan, linagliptin, atorvastatin, canagliflozin, clopidogrel, glucose blood, ONE TOUCH BASIC SYSTEM, ONE TOUCH LANCETS, ezetimibe, tamsulosin, furosemide, Vitamin D (Ergocalciferol), metoprolol tartrate, cetirizine, TRADJENTA, INVOKANA, metFORMIN, amoxicillin, HYDROcodone-acetaminophen, and chlorhexidine.  Meds ordered this encounter  Medications  . amoxicillin (AMOXIL) 500 MG capsule    Sig:   . HYDROcodone-acetaminophen (NORCO/VICODIN) 5-325 MG tablet    Sig:   . chlorhexidine (PERIDEX) 0.12 % solution    Sig:   . Insulin Glargine (TOUJEO SOLOSTAR) 300 UNIT/ML SOPN    Sig: Inject 20 Units into the skin daily.    Dispense:  1.5 mL    Refill:  11   See AVS for instructions about healthy living and anticipatory guidance.  Follow-up: Return in about 4 months (around 11/06/2015).  Scarlette Calico, MD

## 2015-07-07 NOTE — Assessment & Plan Note (Signed)

## 2015-07-08 ENCOUNTER — Other Ambulatory Visit: Payer: Self-pay | Admitting: Family Medicine

## 2015-07-09 NOTE — Telephone Encounter (Signed)
Refill for another 3 months?

## 2015-07-12 ENCOUNTER — Telehealth: Payer: Self-pay | Admitting: *Deleted

## 2015-07-12 DIAGNOSIS — Z794 Long term (current) use of insulin: Principal | ICD-10-CM

## 2015-07-12 DIAGNOSIS — E1121 Type 2 diabetes mellitus with diabetic nephropathy: Secondary | ICD-10-CM

## 2015-07-12 MED ORDER — INSULIN DETEMIR 100 UNIT/ML FLEXPEN: 20.0000 [IU] | PEN_INJECTOR | Freq: Every day | SUBCUTANEOUS | Status: DC

## 2015-07-12 NOTE — Telephone Encounter (Signed)
Changed to levemir

## 2015-07-12 NOTE — Telephone Encounter (Signed)
Called pt no answer LMOM of med change...Michelle Lopez

## 2015-07-12 NOTE — Telephone Encounter (Signed)
Receive call states pt Michelle Lopez is not on pt formulary. The alternatives are Levemir or Zeb Comfort if either of these is not ok can proceed with PA...[name]

## 2015-07-21 ENCOUNTER — Telehealth: Payer: Self-pay | Admitting: *Deleted

## 2015-07-21 MED ORDER — INSULIN PEN NEEDLE 32G X 4 MM MISC: Status: DC

## 2015-07-21 NOTE — Telephone Encounter (Signed)
Left msg on triage stating MD sent rx for Levemir, but did not send rx for pen needles. Need rx sent to [facility]. Called pt no answer LMOM rx sent to pharmacy...[name]

## 2015-08-09 ENCOUNTER — Other Ambulatory Visit: Payer: Self-pay | Admitting: Internal Medicine

## 2015-08-27 ENCOUNTER — Encounter: Payer: Self-pay | Admitting: Internal Medicine

## 2015-08-27 ENCOUNTER — Other Ambulatory Visit: Payer: Self-pay

## 2015-08-27 ENCOUNTER — Ambulatory Visit (INDEPENDENT_AMBULATORY_CARE_PROVIDER_SITE_OTHER): Payer: Medicare Other | Admitting: Internal Medicine

## 2015-08-27 VITALS — BP 118/64 | HR 78 | Ht 60.0 in | Wt 135.0 lb

## 2015-08-27 DIAGNOSIS — E1122 Type 2 diabetes mellitus with diabetic chronic kidney disease: Secondary | ICD-10-CM | POA: Diagnosis not present

## 2015-08-27 DIAGNOSIS — N183 Chronic kidney disease, stage 3 unspecified: Secondary | ICD-10-CM

## 2015-08-27 MED ORDER — GLUCOSE BLOOD VI STRP: ORAL_STRIP | Status: DC

## 2015-08-27 MED ORDER — ONETOUCH ULTRASOFT LANCETS MISC: Status: DC

## 2015-08-27 NOTE — Patient Instructions (Addendum)
Please continue: - Metformin 1000 mg 2x a day, with meals - Tradjenta 5 mg in am - Invokana 300 mg in am  Please take the medicines EVERY DAY.  Please let me know if the sugars are consistently <80 or >200.  Please return in 1.5 months with your sugar log.   PATIENT INSTRUCTIONS FOR TYPE 2 DIABETES:  **Please join MyChart!** - see attached instructions about how to join if you have not done so already.  DIET AND EXERCISE Diet and exercise is an important part of diabetic treatment.  We recommended aerobic exercise in the form of brisk walking (working between 40-60% of maximal aerobic capacity, similar to brisk walking) for 150 minutes per week (such as 30 minutes five days per week) along with 3 times per week performing 'resistance' training (using various gauge rubber tubes with handles) 5-10 exercises involving the major muscle groups (upper body, lower body and core) performing 10-15 repetitions (or near fatigue) each exercise. Start at half the above goal but build slowly to reach the above goals. If limited by weight, joint pain, or disability, we recommend daily walking in a swimming pool with water up to waist to reduce pressure from joints while allow for adequate exercise.    BLOOD GLUCOSES Monitoring your blood glucoses is important for continued management of your diabetes. Please check your blood glucoses 2-4 times a day: fasting, before meals and at bedtime (you can rotate these measurements - e.g. one day check before the 3 meals, the next day check before 2 of the meals and before bedtime, etc.).   HYPOGLYCEMIA (low blood sugar) Hypoglycemia is usually a reaction to not eating, exercising, or taking too much insulin/ other diabetes drugs.  Symptoms include tremors, sweating, hunger, confusion, headache, etc. Treat IMMEDIATELY with 15 grams of Carbs: . 4 glucose tablets .  cup regular juice/soda . 2 tablespoons raisins . 4 teaspoons sugar . 1 tablespoon honey Recheck  blood glucose in 15 mins and repeat above if still symptomatic/blood glucose <100.  RECOMMENDATIONS TO REDUCE YOUR RISK OF DIABETIC COMPLICATIONS: * Take your prescribed MEDICATION(S) * Follow a DIABETIC diet: Complex carbs, fiber rich foods, (monounsaturated and polyunsaturated) fats * AVOID saturated/trans fats, high fat foods, >2,300 mg salt per day. * EXERCISE at least 5 times a week for 30 minutes or preferably daily.  * DO NOT SMOKE OR DRINK more than 1 drink a day. * Check your FEET every day. Do not wear tightfitting shoes. Contact us if you develop an ulcer * See your EYE doctor once a year or more if needed * Get a FLU shot once a year * Get a PNEUMONIA vaccine once before and once after age 62 years  GOALS:  * Your Hemoglobin A1c of <7%  * fasting sugars need to be <130 * after meals sugars need to be <180 (2h after you start eating) * Your Systolic BP should be [id number] or lower  * Your Diastolic BP should be 80 or lower  * Your HDL (Good Cholesterol) should be 40 or higher  * Your LDL (Bad Cholesterol) should be 100 or lower. * Your Triglycerides should be 150 or lower  * Your Urine microalbumin (kidney function) should be <30 * Your Body Mass Index should be 25 or lower    Please consider the following ways to cut down carbs and fat and increase fiber and micronutrients in your diet: - substitute whole grain for white bread or pasta - substitute brown rice for white rice -  substitute 90-calorie flat bread pieces for slices of bread when possible - substitute sweet potatoes or yams for white potatoes - substitute humus for margarine - substitute tofu for cheese when possible - substitute almond or rice milk for regular milk (would not drink soy milk daily due to concern for soy estrogen influence on breast cancer risk) - substitute dark chocolate for other sweets when possible - substitute water - can add lemon or orange slices for taste - for diet sodas (artificial  sweeteners will trick your body that you can eat sweets without getting calories and will lead you to overeating and weight gain in the long run) - do not skip breakfast or other meals (this will slow down the metabolism and will result in more weight gain over time)  - can try smoothies made from fruit and almond/rice milk in am instead of regular breakfast - can also try old-fashioned (not instant) oatmeal made with almond/rice milk in am - order the dressing on the side when eating salad at a restaurant (pour less than half of the dressing on the salad) - eat as little meat as possible - can try juicing, but should not forget that juicing will get rid of the fiber, so would alternate with eating raw veg./fruits or drinking smoothies - use as little oil as possible, even when using olive oil - can dress a salad with a mix of balsamic vinegar and lemon juice, for e.g. - use agave nectar, stevia sugar, or regular sugar rather than artificial sweateners - steam or broil/roast veggies  - snack on veggies/fruit/nuts (unsalted, preferably) when possible, rather than processed foods - reduce or eliminate aspartame in diet (it is in diet sodas, chewing gum, etc) Read the labels!  Try to read Dr. Janene Harvey book: "Program for Reversing Diabetes" for other ideas for healthy eating.

## 2015-08-27 NOTE — Progress Notes (Addendum)
Patient ID: Michelle Lopez, female   DOB: 09-Sep-1945, 70 y.o.   MRN: [id number]  HPI: Michelle Lopez is a 70 y.o.-year-old female, referred by her PCP, Dr. Ronnald Ramp for management of DM2, dx in ~2000, non-insulin-dependent, uncontrolled, with complications (DR, PN, CKD stage 3, cerebrovascular disease, s/p stroke).  Last hemoglobin A1c was: Lab Results  Component Value Date   HGBA1C 7.6* 07/06/2015   HGBA1C 7.3* 02/25/2015   HGBA1C 7.2* 06/23/2014   Pt is on a regimen of: - Metformin 1000 mg 2x a day, with meals (missed doses - 2/7 days) - Tradjenta 5 mg in am (missed doses - 2/7 days) - Invokana 300 mg in am (missed doses - 2/7 days) She took Toujeo 20 units at bedtime - added by PCP 07/06/2015 after last HbA1c - took it x 1 week but was forgetting doses  >> stopped.   Pt does not check her sugars. - am: n/c - 2h after b'fast: n/c - before lunch: n/c - 2h after lunch: n/c - before dinner: n/c - 2h after dinner: n/c - bedtime: n/c - nighttime: n/c  Glucometer: One Touch  Pt's meals are: - Breakfast: Eggs, toast, cereal - Lunch: Sandwich - Dinner: Diverse - Snacks: a lot, scattered through the day  Her daughter tells me that patient does not sleep at night, and only falls asleep in the morning and wakes up late, even at 2 PM.  - + CKD, last BUN/creatinine:  Lab Results  Component Value Date   BUN 15 07/06/2015   BUN 17 02/25/2015   CREATININE 1.19 07/06/2015   CREATININE 1.08 02/25/2015   Component     Latest Ref Rng 07/06/2015  Microalb, Ur     0.0 - 1.9 mg/dL <0.7  Creatinine,U      121.4  MICROALB/CREAT RATIO     0.0 - 30.0 mg/g 0.6  On Losartan. - last set of lipids: Lab Results  Component Value Date   CHOL 183 07/06/2015   HDL 38.10* 07/06/2015   LDLCALC 110* 06/23/2014   LDLDIRECT 100.0 07/06/2015   TRIG 291.0* 07/06/2015   CHOLHDL 5 07/06/2015  On Lipitor. - last eye exam was in 2015. + DR.  - + numbness and tingling in her feet. On ASA 81.  Pt has FH  of DM in daughters.  ROS: Constitutional: no weight gain/loss, + fatigue, + subjective hypothermia, + poor sleep Eyes: + blurry vision, no xerophthalmia ENT: no sore throat, no nodules palpated in throat, no dysphagia/odynophagia, no hoarseness Cardiovascular: no CP/SOB/palpitations/leg swelling Respiratory: no cough/SOB Gastrointestinal: no N/V/D/C Musculoskeletal: no muscle/joint aches Skin: no rashes Neurological: no tremors/numbness/tingling/dizziness Psychiatric: no depression/anxiety  Past Medical History  Diagnosis Date  . Hyperlipidemia   . Diabetes mellitus     type 2, uncontrolled  . Essential hypertension, benign   . Cocaine abuse, unspecified   . Diarrhea   . Cerebrovascular disease, unspecified   . Stroke [facility])     x7   Past Surgical History  Procedure Laterality Date  . Total hip arthroplasty  1970    Dr. Rodell Perna   Social History   Social History  . Marital Status: Divorced    Spouse Name: N/A  . Number of Children: 4   Occupational History  . Disability    Social History Main Topics  . Smoking status: Never Smoker   . Smokeless tobacco: Never Used  . Alcohol Use: No  . Drug Use: No   Social History Narrative   Daily caffeine  Current Outpatient Prescriptions on File Prior to Visit  Medication Sig Dispense Refill  . aspirin 81 MG tablet Take 81 mg by mouth daily.      Marland Kitchen atorvastatin (LIPITOR) 20 MG tablet Take 1 tablet (20 mg total) by mouth daily. 90 tablet 3  . Canagliflozin (INVOKANA) 300 MG TABS Take 1 tablet (300 mg total) by mouth daily. 90 tablet 3  . cetirizine (ZYRTEC) 10 MG tablet TAKE 1 TABLET BY MOUTH ONCE DAILY 30 tablet 1  . clopidogrel (PLAVIX) 75 MG tablet Take 1 tablet (75 mg total) by mouth daily. 90 tablet 3  . furosemide (LASIX) 20 MG tablet Take 1 tablet (20 mg total) by mouth daily. 30 tablet 11  . linagliptin (TRADJENTA) 5 MG TABS tablet Take 1 tablet (5 mg total) by mouth daily. 90 tablet 3  . losartan (COZAAR) 100  MG tablet Take 1 tablet (100 mg total) by mouth daily. 90 tablet 3  . metFORMIN (GLUCOPHAGE) 1000 MG tablet TAKE 1 TABLET BY MOUTH TWICE DAILY WITH A MEAL 180 tablet 0  . metoprolol tartrate (LOPRESSOR) 25 MG tablet TAKE 1/2 TABLET BY MOUTH TWICE DAILY 180 tablet 0  . SILENOR 6 MG TABS TAKE 1 TABLET BY MOUTH EVERY NIGHT AT BEDTIME AS NEEDED 90 tablet 0  . tamsulosin (FLOMAX) 0.4 MG CAPS capsule Take 1 capsule (0.4 mg total) by mouth daily. 90 capsule 3  . Vitamin D, Ergocalciferol, (DRISDOL) 50000 units CAPS capsule TAKE 1 CAPSULE BY MOUTH EVERY 7 DAYS 12 capsule 0  . amoxicillin (AMOXIL) 500 MG capsule Reported on 08/27/2015    . Blood Glucose Monitoring Suppl ([place]) W/DEVICE KIT Test up to TID dx: 250.42 (Patient not taking: Reported on 08/27/2015) 1 each 1  . chlorhexidine (PERIDEX) 0.12 % solution Reported on 08/27/2015    . ezetimibe (ZETIA) 10 MG tablet Take 1 tablet (10 mg total) by mouth daily. (Patient not taking: Reported on 08/27/2015) 90 tablet 3  . glucose blood test strip Test up to TID dx: 250.42 (Patient not taking: Reported on 08/27/2015) 100 each 12  . HYDROcodone-acetaminophen (NORCO/VICODIN) 5-325 MG tablet Reported on 08/27/2015    . Insulin Pen Needle (BD PEN NEEDLE NANO U/F) 32G X 4 MM MISC Use to administer levemir insulin  Dx E11.9 (Patient not taking: Reported on 08/27/2015) 90 each 3  . ONE TOUCH LANCETS MISC Test up to TID dx: 250.42 (Patient not taking: Reported on 07/06/2015) 200 each 11  . PRODIGY TWIST TOP LANCETS 28G MISC USE TO CHECK BLOOD SUGAR TWICE DAILY (Patient not taking: Reported on 07/06/2015) 100 each PRN  . [DISCONTINUED] glipiZIDE (GLUCOTROL) 5 MG tablet Take 5 mg by mouth 2 (two) times daily before a meal.     No current facility-administered medications on file prior to visit.   Allergies  Allergen Reactions  . Crestor [Rosuvastatin Calcium]     myalgias  . Morphine Hives  . Pravastatin Sodium Rash  . Simvastatin Rash   Family  History  Problem Relation Age of Onset  . Colon cancer Neg Hx    PE: BP 118/64 mmHg  Pulse 78  Ht 5' (1.524 m)  Wt 135 lb (61.236 kg)  BMI 26.37 kg/m2  SpO2 95% Wt Readings from Last 3 Encounters:  08/27/15 135 lb (61.236 kg)  07/06/15 135 lb (61.236 kg)  02/25/15 142 lb (64.411 kg)   Constitutional: Normal weight, in NAD Eyes: PERRLA, EOMI, no exophthalmos ENT: moist mucous membranes, no thyromegaly, no cervical lymphadenopathy Cardiovascular:  RRR, No MRG Respiratory: CTA B Gastrointestinal: abdomen soft, NT, ND, BS+ Musculoskeletal: no deformities, strength intact in all 4 Skin: moist, warm, no rashes Neurological: no tremor with outstretched hands, DTR normal in all 4  ASSESSMENT: 1. DM2, non-insulin-dependent, uncontrolled, with complications - CKD stage 3 - Cerebrovascular disease, status post stroke - Diabetic retinopathy - Peripheral neuropathy  PLAN:  1. Patient with long-standing, uncontrolled diabetes, on oral antidiabetic regimen, With worsening control at last check, due to noncompliance with her medications. Her daughter, who accompanies her today, confirms that she is missing doses. The patient started to take basal insulin, but stopped after a week. We had a long discussion today about the fact that if she takes her medicines as prescribed, daily, we will not need insulin now. She agrees to try to do this. Her daughter is already organizing her medications into a pillbox. - We also discussed about the need to reduce sweet snacks - I suggested to:  Patient Instructions  Please continue: - Metformin 1000 mg 2x a day, with meals - Tradjenta 5 mg in am - Invokana 300 mg in am  Please take the medicines EVERY DAY.  Please let me know if the sugars are consistently <80 or >200.  Please return in 1.5 months with your sugar log.   - Strongly advised her to start checking sugars at different times of the day - check 1 time a day, rotating checks - given sugar  log and advised how to fill it and to bring it at next appt  - given foot care handout and explained the principles  - given instructions for hypoglycemia management "15-15 rule"  - advised for yearly eye exams >> she needs one - Return to clinic in 1.5 mo with sugar log   Philemon Kingdom, MD PhD [facility] Endocrinology

## 2015-10-15 ENCOUNTER — Ambulatory Visit: Payer: Medicare Other

## 2015-10-18 ENCOUNTER — Ambulatory Visit: Payer: Medicare Other | Admitting: Internal Medicine

## 2015-10-18 DIAGNOSIS — Z0289 Encounter for other administrative examinations: Secondary | ICD-10-CM

## 2015-10-29 ENCOUNTER — Other Ambulatory Visit: Payer: Self-pay | Admitting: Family Medicine

## 2015-10-29 ENCOUNTER — Other Ambulatory Visit: Payer: Self-pay | Admitting: Internal Medicine

## 2015-11-16 ENCOUNTER — Other Ambulatory Visit: Payer: Self-pay | Admitting: Internal Medicine

## 2015-11-26 ENCOUNTER — Other Ambulatory Visit: Payer: Self-pay | Admitting: Internal Medicine

## 2015-12-29 ENCOUNTER — Other Ambulatory Visit: Payer: Self-pay | Admitting: Internal Medicine

## 2016-02-22 ENCOUNTER — Other Ambulatory Visit: Payer: Self-pay | Admitting: Internal Medicine

## 2016-02-22 ENCOUNTER — Ambulatory Visit: Payer: Medicare Other | Admitting: Internal Medicine

## 2016-03-02 DIAGNOSIS — H01003 Unspecified blepharitis right eye, unspecified eyelid: Secondary | ICD-10-CM | POA: Diagnosis not present

## 2016-03-02 DIAGNOSIS — H04123 Dry eye syndrome of bilateral lacrimal glands: Secondary | ICD-10-CM | POA: Diagnosis not present

## 2016-03-15 ENCOUNTER — Ambulatory Visit: Payer: Medicare Other | Admitting: Internal Medicine

## 2016-03-16 ENCOUNTER — Ambulatory Visit: Payer: Medicare Other | Admitting: Internal Medicine

## 2016-03-20 ENCOUNTER — Ambulatory Visit: Payer: Medicare Other | Admitting: Internal Medicine

## 2016-04-24 ENCOUNTER — Other Ambulatory Visit: Payer: Self-pay | Admitting: Internal Medicine

## 2016-05-02 DIAGNOSIS — E113293 Type 2 diabetes mellitus with mild nonproliferative diabetic retinopathy without macular edema, bilateral: Secondary | ICD-10-CM | POA: Diagnosis not present

## 2016-05-02 DIAGNOSIS — H402211 Chronic angle-closure glaucoma, right eye, mild stage: Secondary | ICD-10-CM | POA: Diagnosis not present

## 2016-05-02 DIAGNOSIS — E1165 Type 2 diabetes mellitus with hyperglycemia: Secondary | ICD-10-CM | POA: Diagnosis not present

## 2016-05-02 DIAGNOSIS — H402222 Chronic angle-closure glaucoma, left eye, moderate stage: Secondary | ICD-10-CM | POA: Diagnosis not present

## 2016-05-02 LAB — HM DIABETES EYE EXAM

## 2016-05-04 ENCOUNTER — Encounter: Payer: Self-pay | Admitting: Internal Medicine

## 2016-05-04 NOTE — Progress Notes (Unsigned)
Result abstracted 

## 2016-05-19 ENCOUNTER — Other Ambulatory Visit: Payer: Self-pay | Admitting: Internal Medicine

## 2016-05-19 NOTE — Telephone Encounter (Signed)
Pt is due for an appt but none are scheduled.   LOV was 06/2015. Please advise on refill.

## 2016-05-23 ENCOUNTER — Other Ambulatory Visit: Payer: Self-pay | Admitting: Internal Medicine

## 2016-06-01 ENCOUNTER — Ambulatory Visit: Payer: Medicare Other

## 2016-07-24 ENCOUNTER — Ambulatory Visit (INDEPENDENT_AMBULATORY_CARE_PROVIDER_SITE_OTHER): Payer: Medicare Other | Admitting: Internal Medicine

## 2016-07-24 ENCOUNTER — Other Ambulatory Visit (INDEPENDENT_AMBULATORY_CARE_PROVIDER_SITE_OTHER): Payer: Medicare Other

## 2016-07-24 ENCOUNTER — Encounter: Payer: Self-pay | Admitting: Internal Medicine

## 2016-07-24 VITALS — BP 112/70 | HR 73 | Temp 98.3°F | Resp 16 | Ht 60.0 in | Wt 128.2 lb

## 2016-07-24 DIAGNOSIS — E1121 Type 2 diabetes mellitus with diabetic nephropathy: Secondary | ICD-10-CM

## 2016-07-24 DIAGNOSIS — I6502 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: Secondary | ICD-10-CM

## 2016-07-24 DIAGNOSIS — I10 Essential (primary) hypertension: Secondary | ICD-10-CM

## 2016-07-24 DIAGNOSIS — E785 Hyperlipidemia, unspecified: Secondary | ICD-10-CM | POA: Diagnosis not present

## 2016-07-24 DIAGNOSIS — I63013 Cerebral infarction due to thrombosis of bilateral vertebral arteries: Secondary | ICD-10-CM | POA: Diagnosis not present

## 2016-07-24 DIAGNOSIS — I739 Peripheral vascular disease, unspecified: Secondary | ICD-10-CM | POA: Diagnosis not present

## 2016-07-24 LAB — LIPID PANEL
CHOLESTEROL: 177 mg/dL (ref 0–200)
HDL: 43.9 mg/dL (ref >39.00)
LDL CALC: 111 mg/dL — AB (ref 0–99)
NonHDL: 133.22
TRIGLYCERIDES: 109 mg/dL (ref 0.0–149.0)
Total CHOL/HDL Ratio: 4
VLDL: 21.8 mg/dL (ref 0.0–40.0)

## 2016-07-24 LAB — URINALYSIS, ROUTINE W REFLEX MICROSCOPIC
Bilirubin Urine: NEGATIVE
Hgb urine dipstick: NEGATIVE
Ketones, ur: NEGATIVE
Leukocytes, UA: NEGATIVE
Nitrite: NEGATIVE
PH: 5.5 (ref 5.0–8.0)
SPECIFIC GRAVITY, URINE: 1.015 (ref 1.000–1.030)
Total Protein, Urine: NEGATIVE
Urine Glucose: >1000 — AB
Urobilinogen, UA: 0.2 — AB (ref 0.0–1.0)

## 2016-07-24 LAB — CBC WITH DIFFERENTIAL/PLATELET
Basophils Absolute: 0.1 10*3/uL (ref 0.0–0.1)
Basophils Relative: 1.1 % (ref 0.0–3.0)
EOS PCT: 0.5 % (ref 0.0–5.0)
Eosinophils Absolute: 0 10*3/uL (ref 0.0–0.7)
HCT: 45.4 % (ref 36.0–46.0)
Hemoglobin: 14.2 g/dL (ref 12.0–15.0)
LYMPHS ABS: 2.7 10*3/uL (ref 0.7–4.0)
Lymphocytes Relative: 40.5 % (ref 12.0–46.0)
MCHC: 31.4 g/dL (ref 30.0–36.0)
MCV: 85.1 fl (ref 78.0–100.0)
MONO ABS: 0.7 10*3/uL (ref 0.1–1.0)
MONOS PCT: 9.9 % (ref 3.0–12.0)
NEUTROS ABS: 3.2 10*3/uL (ref 1.4–7.7)
NEUTROS PCT: 48 % (ref 43.0–77.0)
PLATELETS: 266 10*3/uL (ref 150.0–400.0)
RBC: 5.33 Mil/uL — AB (ref 3.87–5.11)
RDW: 14.1 % (ref 11.5–15.5)
WBC: 6.7 10*3/uL (ref 4.0–10.5)

## 2016-07-24 LAB — COMPREHENSIVE METABOLIC PANEL
ALBUMIN: 4.2 g/dL (ref 3.5–5.2)
ALT: 6 U/L (ref 0–35)
AST: 11 U/L (ref 0–37)
Alkaline Phosphatase: 60 U/L (ref 39–117)
BILIRUBIN TOTAL: 0.4 mg/dL (ref 0.2–1.2)
BUN: 13 mg/dL (ref 6–23)
CALCIUM: 9.9 mg/dL (ref 8.4–10.5)
CO2: 32 mEq/L (ref 19–32)
CREATININE: 1.05 mg/dL (ref 0.40–1.20)
Chloride: 101 mEq/L (ref 96–112)
GFR: 65.99 mL/min (ref >60.00)
Glucose, Bld: 141 mg/dL — ABNORMAL HIGH (ref 70–99)
Potassium: 4.1 mEq/L (ref 3.5–5.1)
Sodium: 140 mEq/L (ref 135–145)
Total Protein: 7.6 g/dL (ref 6.0–8.3)

## 2016-07-24 LAB — TSH: TSH: 2.25 u[IU]/mL (ref 0.35–4.50)

## 2016-07-24 LAB — MICROALBUMIN / CREATININE URINE RATIO
CREATININE, U: 97.3 mg/dL
MICROALB UR: 0.7 mg/dL (ref 0.0–1.9)
MICROALB/CREAT RATIO: 0.7 mg/g (ref 0.0–30.0)

## 2016-07-24 LAB — HEMOGLOBIN A1C: HEMOGLOBIN A1C: 6.9 % — AB (ref 4.6–6.5)

## 2016-07-24 NOTE — Patient Instructions (Signed)

## 2016-07-24 NOTE — Progress Notes (Signed)
Subjective:  Patient ID: Michelle Lopez, female    DOB: [birth year]  Age: 71 y.o. MRN: [id number]  CC: Diabetes and Hyperlipidemia   HPI [name] presents for f/up - Her daughter is with her today and the daughter is exasperated with trying to take care of her mother as well as having other needs within the family. It appears the patient has only been taking metformin to control her blood sugar. She has had a 7 pound weight loss since I last saw her. She complains of chronic, unchanged neurological symptoms including ataxia, frequent falls, diffuse lower extremity pain and weakness in her left upper extremity and both lower extremities.  Outpatient Medications Prior to Visit  Medication Sig Dispense Refill  . amoxicillin (AMOXIL) 500 MG capsule Reported on 08/27/2015    . aspirin 81 MG tablet Take 81 mg by mouth daily.      . Blood Glucose Monitoring Suppl (ONE TOUCH BASIC SYSTEM) W/DEVICE KIT Test up to TID dx: 250.42 1 each 1  . cetirizine (ZYRTEC) 10 MG tablet TAKE 1 TABLET BY MOUTH ONCE DAILY 30 tablet 11  . furosemide (LASIX) 20 MG tablet TAKE 1 TABLET BY MOUTH EVERY DAY 30 tablet 9  . glucose blood (ONETOUCH VERIO) test strip Use as instructed 100 each 12  . glucose blood test strip Test up to TID dx: 250.42 100 each 12  . HYDROcodone-acetaminophen (NORCO/VICODIN) 5-325 MG tablet Reported on 08/27/2015    . Insulin Pen Needle (BD PEN NEEDLE NANO U/F) 32G X 4 MM MISC Use to administer levemir insulin  Dx E11.9 90 each 3  . Lancets (ONETOUCH ULTRASOFT) lancets Use as instructed 100 each 12  . metoprolol tartrate (LOPRESSOR) 25 MG tablet TAKE 1/2 TABLET BY MOUTH TWICE DAILY 180 tablet 1  . SILENOR 6 MG TABS TAKE 1 TABLET BY MOUTH EVERY NIGHT AT BEDTIME AS NEEDED 90 tablet 3  . tamsulosin (FLOMAX) 0.4 MG CAPS capsule TAKE 1 CAPSULE BY MOUTH EVERY DAY 90 capsule 1  . Vitamin D, Ergocalciferol, (DRISDOL) 50000 units CAPS capsule TAKE 1 CAPSULE BY MOUTH EVERY 7 DAYS 12 capsule 3  .  atorvastatin (LIPITOR) 20 MG tablet Take 1 tablet (20 mg total) by mouth daily. 90 tablet 3  . Canagliflozin (INVOKANA) 300 MG TABS Take 1 tablet (300 mg total) by mouth daily. 90 tablet 3  . clopidogrel (PLAVIX) 75 MG tablet Take 1 tablet (75 mg total) by mouth daily. 90 tablet 3  . INVOKANA 300 MG TABS tablet TAKE 1 TABLET BY MOUTH EVERY DAY 90 tablet 1  . linagliptin (TRADJENTA) 5 MG TABS tablet Take 1 tablet (5 mg total) by mouth daily. 90 tablet 3  . losartan (COZAAR) 100 MG tablet TAKE 1 TABLET BY MOUTH ONCE DAILY 90 tablet 1  . metFORMIN (GLUCOPHAGE) 1000 MG tablet TAKE 1 TABLET BY MOUTH TWICE DAILY WITH A MEAL 180 tablet 1  . TRADJENTA 5 MG TABS tablet TAKE 1 TABLET BY MOUTH ONCE DAILY 90 tablet 1  . chlorhexidine (PERIDEX) 0.12 % solution Reported on 08/27/2015    . ezetimibe (ZETIA) 10 MG tablet Take 1 tablet (10 mg total) by mouth daily. (Patient not taking: Reported on 08/27/2015) 90 tablet 3  . losartan (COZAAR) 100 MG tablet Take 1 tablet (100 mg total) by mouth daily. 90 tablet 3  . ONE TOUCH LANCETS MISC Test up to TID dx: 250.42 (Patient not taking: Reported on 07/06/2015) 200 each 11  . PRODIGY TWIST TOP LANCETS 28G MISC USE TO  CHECK BLOOD SUGAR TWICE DAILY (Patient not taking: Reported on 07/06/2015) 100 each PRN  . tamsulosin (FLOMAX) 0.4 MG CAPS capsule Take 1 capsule (0.4 mg total) by mouth daily. 90 capsule 3   No facility-administered medications prior to visit.     ROS Review of Systems  Constitutional: Positive for unexpected weight change. Negative for appetite change, chills, diaphoresis and fatigue.  HENT: Negative.  Negative for trouble swallowing and voice change.   Eyes: Negative for visual disturbance.  Respiratory: Negative for cough, chest tightness, shortness of breath and wheezing.   Cardiovascular: Negative for chest pain, palpitations and leg swelling.  Gastrointestinal: Negative for abdominal pain, constipation, diarrhea, nausea and vomiting.  Endocrine:  Negative for polydipsia, polyphagia and polyuria.  Genitourinary: Negative.  Negative for difficulty urinating, dysuria, frequency and urgency.  Musculoskeletal: Positive for gait problem. Negative for back pain, joint swelling, myalgias and neck pain.  Skin: Negative.  Negative for color change and rash.  Neurological: Positive for speech difficulty and weakness. Negative for dizziness, syncope, facial asymmetry, numbness and headaches.  Hematological: Negative for adenopathy. Does not bruise/bleed easily.  Psychiatric/Behavioral: Negative.     Objective:  BP 112/70 (BP Location: Left Arm, Patient Position: Sitting, Cuff Size: Normal)   Pulse 73   Temp 98.3 F (36.8 C) (Oral)   Resp 16   Ht 5' (1.524 m)   Wt 128 lb 4 oz (58.2 kg)   SpO2 98%   BMI 25.05 kg/m   BP Readings from Last 3 Encounters:  07/24/16 112/70  08/27/15 118/64  07/06/15 114/70    Wt Readings from Last 3 Encounters:  07/24/16 128 lb 4 oz (58.2 kg)  08/27/15 135 lb (61.2 kg)  07/06/15 135 lb (61.2 kg)    Physical Exam  Constitutional: She is oriented to person, place, and time.  HENT:  Mouth/Throat: Oropharynx is clear and moist. No oropharyngeal exudate.  Eyes: Conjunctivae are normal. Right eye exhibits no discharge. Left eye exhibits no discharge. No scleral icterus.  Neck: Normal range of motion. Neck supple. No JVD present. No thyromegaly present.  Cardiovascular: Normal rate, regular rhythm and intact distal pulses.  Exam reveals no gallop.   No murmur heard. Pulses:      Carotid pulses are 1+ on the right side, and 1+ on the left side.      Radial pulses are 1+ on the right side, and 1+ on the left side.       Femoral pulses are 1+ on the right side, and 1+ on the left side.      Popliteal pulses are 0 on the right side, and 0 on the left side.       Dorsalis pedis pulses are 0 on the right side, and 0 on the left side.       Posterior tibial pulses are 0 on the right side, and 0 on the left  side.  Pulmonary/Chest: Effort normal. No respiratory distress. She has no wheezes. She has rales.  Abdominal: Soft. Bowel sounds are normal. She exhibits no distension and no mass. There is no tenderness. There is no rebound and no guarding.  Musculoskeletal: Normal range of motion. She exhibits no edema, tenderness or deformity.  Lymphadenopathy:    She has no cervical adenopathy.  Neurological: She is alert and oriented to person, place, and time. She displays atrophy. She displays no tremor. A sensory deficit is present. No cranial nerve deficit. She exhibits abnormal muscle tone. She displays no seizure activity. Coordination and gait  abnormal.  Skin: Skin is warm and dry. No rash noted. She is not diaphoretic. No erythema. No pallor.  Psychiatric: She has a normal mood and affect. Her behavior is normal. Judgment and thought content normal.  Vitals reviewed.   Lab Results  Component Value Date   WBC 6.7 07/24/2016   HGB 14.2 07/24/2016   HCT 45.4 07/24/2016   PLT 266.0 07/24/2016   GLUCOSE 141 (H) 07/24/2016   CHOL 177 07/24/2016   TRIG 109.0 07/24/2016   HDL 43.90 07/24/2016   LDLDIRECT 100.0 07/06/2015   LDLCALC 111 (H) 07/24/2016   ALT 6 07/24/2016   AST 11 07/24/2016   NA 140 07/24/2016   K 4.1 07/24/2016   CL 101 07/24/2016   CREATININE 1.05 07/24/2016   BUN 13 07/24/2016   CO2 32 07/24/2016   TSH 2.25 07/24/2016   INR 1.1 (H) 02/25/2015   HGBA1C 6.9 (H) 07/24/2016   MICROALBUR 0.7 07/24/2016    Dg Chest 2 View  Result Date: 02/25/2015 CLINICAL DATA:  Shortness of breath. EXAM: CHEST  2 VIEW COMPARISON:  03/18/2013. FINDINGS: Mediastinum hilar structures normal. Cardiomegaly with mild bilateral from interstitial prominence. Congestive heart failure cannot be excluded. Interstitial pneumonitis cannot be excluded. No prominent pleural effusion. No pneumothorax. No acute bony abnormality . IMPRESSION: Mild cardiomegaly with mild bilateral pulmonary interstitial  prominence. Mild congestive heart failure cannot be excluded . Electronically Signed   By: Marcello Moores  Register   On: 02/25/2015 15:35    Assessment & Plan:   Chrystie was seen today for diabetes and hyperlipidemia.  Diagnoses and all orders for this visit:  Essential hypertension, benign- her blood pressure is well-controlled, electrolytes and renal function are normal, will continue the ARB, metoprolol, furosemide -     Comprehensive metabolic panel; Future -     CBC with Differential/Platelet; Future -     Urinalysis, Routine w reflex microscopic; Future -     losartan (COZAAR) 100 MG tablet; Take 1 tablet (100 mg total) by mouth daily.  Type 2 diabetes mellitus with diabetic nephropathy, without long-term current use of insulin ([place])- prescription refill records indicate she is only taking metformin to control her blood sugar and her A1c is 6.9% so will continue metformin at the current dose. -     Comprehensive metabolic panel; Future -     Hemoglobin A1c; Future -     Microalbumin / creatinine urine ratio; Future -     Consult to [place] Management -     metFORMIN (GLUCOPHAGE) 1000 MG tablet; TAKE 1 TABLET BY MOUTH TWICE DAILY WITH A MEAL -     losartan (COZAAR) 100 MG tablet; Take 1 tablet (100 mg total) by mouth daily.  Hyperlipidemia with target LDL less than 70- she is not achieved her LDL goal, will restart her statin. -     Lipid panel; Future -     TSH; Future -     atorvastatin (LIPITOR) 20 MG tablet; Take 1 tablet (20 mg total) by mouth daily.  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 -     VAS [place] ABI WITH/WO TBI; Future -     clopidogrel (PLAVIX) 75 MG tablet; Take 1 tablet (75 mg total) by mouth daily. -     atorvastatin (LIPITOR) 20 MG tablet; Take 1 tablet (20 mg total) by mouth daily.   I have discontinued Ms. Baskette's PRODIGY TWIST TOP LANCETS 28G, losartan, linagliptin, atorvastatin, canagliflozin, ONE TOUCH LANCETS, ezetimibe, chlorhexidine, TRADJENTA, and INVOKANA. I have also changed her losartan. Additionally, I am having her start on atorvastatin. Lastly, I am having her maintain her aspirin, glucose blood, ONE TOUCH BASIC SYSTEM, amoxicillin, HYDROcodone-acetaminophen, Insulin Pen Needle, glucose blood, onetouch ultrasoft, tamsulosin, SILENOR, metoprolol tartrate, cetirizine, furosemide, Vitamin D (Ergocalciferol), metFORMIN, and clopidogrel.  Meds ordered this encounter  Medications  . metFORMIN (GLUCOPHAGE) 1000 MG tablet    Sig: TAKE 1 TABLET BY MOUTH TWICE DAILY WITH A MEAL    Dispense:  180 tablet    Refill:  1  . clopidogrel (PLAVIX) 75 MG tablet    Sig: Take 1 tablet (75 mg total) by mouth daily.    Dispense:  90 tablet    Refill:  1  . losartan (COZAAR) 100 MG tablet    Sig: Take 1 tablet (100 mg total) by mouth daily.    Dispense:  90 tablet    Refill:  1  . atorvastatin (LIPITOR) 20 MG tablet    Sig: Take 1 tablet (20 mg total) by mouth daily.    Dispense:  90 tablet    Refill:  3     Follow-up: Return in about 4 months (around 11/23/2016).  Scarlette Calico, MD

## 2016-07-26 ENCOUNTER — Other Ambulatory Visit: Payer: Self-pay | Admitting: Internal Medicine

## 2016-07-26 ENCOUNTER — Ambulatory Visit (HOSPITAL_COMMUNITY)
Admission: RE | Admit: 2016-07-26 | Discharge: 2016-07-26 | Disposition: A | Discharge status: Home / Self Care | Payer: Medicare Other | Source: Ambulatory Visit | Attending: Internal Medicine | Admitting: Internal Medicine

## 2016-07-26 DIAGNOSIS — I739 Peripheral vascular disease, unspecified: Secondary | ICD-10-CM | POA: Diagnosis not present

## 2016-07-26 MED ORDER — ATORVASTATIN CALCIUM 20 MG PO TABS: 20.0000 mg | ORAL_TABLET | Freq: Every day | ORAL | 3 refills | Status: DC

## 2016-07-26 MED ORDER — METFORMIN HCL 1000 MG PO TABS: ORAL_TABLET | ORAL | 1 refills | Status: DC

## 2016-07-26 MED ORDER — CLOPIDOGREL BISULFATE 75 MG PO TABS: 75.0000 mg | ORAL_TABLET | Freq: Every day | ORAL | 1 refills | Status: DC

## 2016-07-26 MED ORDER — LOSARTAN POTASSIUM 100 MG PO TABS: 100.0000 mg | ORAL_TABLET | Freq: Every day | ORAL | 1 refills | Status: DC

## 2016-07-27 ENCOUNTER — Other Ambulatory Visit: Payer: Self-pay | Admitting: Internal Medicine

## 2016-07-28 ENCOUNTER — Other Ambulatory Visit: Payer: Self-pay | Admitting: *Deleted

## 2016-07-28 DIAGNOSIS — E1121 Type 2 diabetes mellitus with diabetic nephropathy: Secondary | ICD-10-CM | POA: Diagnosis not present

## 2016-07-28 DIAGNOSIS — I69354 Hemiplegia and hemiparesis following cerebral infarction affecting left non-dominant side: Secondary | ICD-10-CM | POA: Diagnosis not present

## 2016-07-28 NOTE — Patient Outreach (Signed)
[place] [facility]) Care Management  07/28/2016  Kimanh Templeman [birth year] [id number]  Covering for Valente David, RN for new referral  RN spoke with pt's primary caregiver daughter [name]. RN introduced Tourist information centre manager and purpose for today's call for possible services. Daughter indicated pt is with her at the social security office today and unable to talk but she would call back. States pt currently is in the CPAP program but they could use "all the help" they can get at this time. Daughter is aware that the pt's provider made the referral to [facility] for assistance. RN informed daughter that RN would like to to discuss pt's needs when she is available. RN provided contact number for this RN case manager for today and Roderick Pee the assigned case manager if pt was not able to call back until Monday however either RN would be able to assist. Daughter very appreciative and again will call back when pt is available to talk about her needs. Will report to Valente David, RN with update.  Raina Mina, RN Care Management Coordinator [place] Office [phone]

## 2016-07-29 DIAGNOSIS — I69354 Hemiplegia and hemiparesis following cerebral infarction affecting left non-dominant side: Secondary | ICD-10-CM | POA: Diagnosis not present

## 2016-07-29 DIAGNOSIS — E1121 Type 2 diabetes mellitus with diabetic nephropathy: Secondary | ICD-10-CM | POA: Diagnosis not present

## 2016-08-02 DIAGNOSIS — I69354 Hemiplegia and hemiparesis following cerebral infarction affecting left non-dominant side: Secondary | ICD-10-CM | POA: Diagnosis not present

## 2016-08-02 DIAGNOSIS — E1121 Type 2 diabetes mellitus with diabetic nephropathy: Secondary | ICD-10-CM | POA: Diagnosis not present

## 2016-08-03 DIAGNOSIS — E1121 Type 2 diabetes mellitus with diabetic nephropathy: Secondary | ICD-10-CM | POA: Diagnosis not present

## 2016-08-03 DIAGNOSIS — I69354 Hemiplegia and hemiparesis following cerebral infarction affecting left non-dominant side: Secondary | ICD-10-CM | POA: Diagnosis not present

## 2016-08-04 ENCOUNTER — Other Ambulatory Visit: Payer: Self-pay | Admitting: *Deleted

## 2016-08-04 NOTE — Patient Outreach (Signed)
[place] [facility]) Care Management  08/04/2016  Rhenda [place] Jan 16, 1946 [id number]    Call placed to member to follow up on telephone assessment performed by L. Zigmund Daniel last week.  Spoke to [employer], [employer].  Identity verified, this care manager introduced self and stated purpose of call.  Caregiver report that she is in need help/support.  Member involved with CAPS program, but is home alone several hours a day depending on daughter's work schedule.  She state that her main concern is to have more assistance to decrease the burden on her.  She state she has siblings, but she is the only one providing care to the member.  Adult day programs discussed, she is open to the option, open to having social worker contact for discussion of other options.  Will place referral to social worker, home visit scheduled for within the next 2 weeks.  She denies any known nursing needs at this time, will assess during home visit for nursing needs, will develop individualized plan of care at that time as well.  Provided daughter with contact information for this care manager, advised to contact with questions.  Valente David, [place], MSN [place] [phone]

## 2016-08-08 ENCOUNTER — Encounter: Payer: Self-pay | Admitting: *Deleted

## 2016-08-08 DIAGNOSIS — E1121 Type 2 diabetes mellitus with diabetic nephropathy: Secondary | ICD-10-CM | POA: Diagnosis not present

## 2016-08-08 DIAGNOSIS — I69354 Hemiplegia and hemiparesis following cerebral infarction affecting left non-dominant side: Secondary | ICD-10-CM | POA: Diagnosis not present

## 2016-08-10 DIAGNOSIS — I69354 Hemiplegia and hemiparesis following cerebral infarction affecting left non-dominant side: Secondary | ICD-10-CM | POA: Diagnosis not present

## 2016-08-10 DIAGNOSIS — E1121 Type 2 diabetes mellitus with diabetic nephropathy: Secondary | ICD-10-CM | POA: Diagnosis not present

## 2016-08-13 DIAGNOSIS — E559 Vitamin D deficiency, unspecified: Secondary | ICD-10-CM | POA: Diagnosis not present

## 2016-08-13 DIAGNOSIS — F0390 Unspecified dementia without behavioral disturbance: Secondary | ICD-10-CM | POA: Diagnosis not present

## 2016-08-13 DIAGNOSIS — M1711 Unilateral primary osteoarthritis, right knee: Secondary | ICD-10-CM | POA: Diagnosis not present

## 2016-08-13 DIAGNOSIS — Z7984 Long term (current) use of oral hypoglycemic drugs: Secondary | ICD-10-CM | POA: Diagnosis not present

## 2016-08-13 DIAGNOSIS — I10 Essential (primary) hypertension: Secondary | ICD-10-CM | POA: Diagnosis not present

## 2016-08-13 DIAGNOSIS — Z7901 Long term (current) use of anticoagulants: Secondary | ICD-10-CM | POA: Diagnosis not present

## 2016-08-13 DIAGNOSIS — Z7982 Long term (current) use of aspirin: Secondary | ICD-10-CM | POA: Diagnosis not present

## 2016-08-13 DIAGNOSIS — E1151 Type 2 diabetes mellitus with diabetic peripheral angiopathy without gangrene: Secondary | ICD-10-CM | POA: Diagnosis not present

## 2016-08-13 DIAGNOSIS — I69354 Hemiplegia and hemiparesis following cerebral infarction affecting left non-dominant side: Secondary | ICD-10-CM | POA: Diagnosis not present

## 2016-08-13 DIAGNOSIS — E785 Hyperlipidemia, unspecified: Secondary | ICD-10-CM | POA: Diagnosis not present

## 2016-08-13 DIAGNOSIS — E1121 Type 2 diabetes mellitus with diabetic nephropathy: Secondary | ICD-10-CM | POA: Diagnosis not present

## 2016-08-14 ENCOUNTER — Other Ambulatory Visit: Payer: Medicare Other | Admitting: *Deleted

## 2016-08-14 DIAGNOSIS — I69354 Hemiplegia and hemiparesis following cerebral infarction affecting left non-dominant side: Secondary | ICD-10-CM | POA: Diagnosis not present

## 2016-08-14 DIAGNOSIS — E1121 Type 2 diabetes mellitus with diabetic nephropathy: Secondary | ICD-10-CM | POA: Diagnosis not present

## 2016-08-14 NOTE — Patient Outreach (Signed)
[facility] [facility]) Care Management  08/14/2016  Michelle Lopez 11/29/1945 [id number]   CSW made an initial attempt to try and contact patient's daughter, Michelle Lopez today to perform the phone assessment on patient, as well as assess and assist with social needs and services, without success.  A HIPAA compliant message was left for Mrs. Michelle Lopez on voicemail.  CSW is currently awaiting a return call. CSW will make a second outreach attempt within the next week, if CSW does not receive a return call from Mrs. Michelle Lopez in the meantime. Nat Christen, BSW, MSW, LCSW  Licensed Education officer, [facility]  Mailing [place] [address], [place], [place] [address] Physical Address-[address], [place], [place] [address] Toll Free Main # [phone] Fax # [phone] Cell # [phone]  Office # [phone] [email or web address]

## 2016-08-15 ENCOUNTER — Encounter: Payer: Self-pay | Admitting: *Deleted

## 2016-08-15 ENCOUNTER — Other Ambulatory Visit: Payer: Self-pay | Admitting: *Deleted

## 2016-08-15 ENCOUNTER — Telehealth: Payer: Self-pay | Admitting: Internal Medicine

## 2016-08-15 ENCOUNTER — Other Ambulatory Visit: Payer: Self-pay | Admitting: Internal Medicine

## 2016-08-15 DIAGNOSIS — E1121 Type 2 diabetes mellitus with diabetic nephropathy: Secondary | ICD-10-CM | POA: Diagnosis not present

## 2016-08-15 DIAGNOSIS — I69354 Hemiplegia and hemiparesis following cerebral infarction affecting left non-dominant side: Secondary | ICD-10-CM | POA: Diagnosis not present

## 2016-08-15 NOTE — Patient Outreach (Signed)
[facility]) Care Management   08/15/2016  Michelle Lopez 09-25-1945 [id number]  Michelle Lopez is an 71 y.o. female  Subjective:   Member alert and oriented x3.  She complains of pain in left leg, report she has ongoing physical therapy in the home.  Objective:   Review of Systems  Constitutional: Negative.   HENT: Negative.   Eyes: Negative.   Respiratory: Negative.   Cardiovascular: Negative.   Gastrointestinal: Negative.   Genitourinary: Negative.   Musculoskeletal: Negative.   Skin: Negative.   Neurological: Negative.   Endo/Heme/Allergies: Negative.   Psychiatric/Behavioral: Negative.     Physical Exam  Constitutional: She is oriented to person, place, and time. She appears well-developed and well-nourished.  Neck: Normal range of motion.  Cardiovascular: Normal rate, regular rhythm and normal heart sounds.   Respiratory: Effort normal and breath sounds normal.  GI: Soft. Bowel sounds are normal.  Musculoskeletal: Normal range of motion.  Neurological: She is alert and oriented to person, place, and time.  Skin: Skin is warm and dry.   BP (!) 164/82 (BP Location: Right Arm, Patient Position: Sitting, Cuff Size: Normal)   Pulse 72   Resp 18   Ht 1.524 m (5')   Wt 125 lb (56.7 kg)   SpO2 94%   BMI 24.41 kg/m   Encounter Medications:   Outpatient Encounter Prescriptions as of 08/15/2016  Medication Sig Note  . aspirin 81 MG tablet Take 81 mg by mouth daily.     Marland Kitchen atorvastatin (LIPITOR) 20 MG tablet Take 1 tablet (20 mg total) by mouth daily.   . cetirizine (ZYRTEC) 10 MG tablet TAKE 1 TABLET BY MOUTH ONCE DAILY   . clopidogrel (PLAVIX) 75 MG tablet Take 1 tablet (75 mg total) by mouth daily.   . furosemide (LASIX) 20 MG tablet TAKE 1 TABLET BY MOUTH EVERY DAY   . linagliptin (TRADJENTA) 5 MG TABS tablet Take 5 mg by mouth daily.   . metFORMIN (GLUCOPHAGE) 1000 MG tablet TAKE 1 TABLET BY MOUTH TWICE DAILY WITH A MEAL   . metoprolol tartrate  (LOPRESSOR) 25 MG tablet TAKE 1/2 TABLET BY MOUTH TWICE DAILY   . SILENOR 6 MG TABS TAKE 1 TABLET BY MOUTH EVERY NIGHT AT BEDTIME AS NEEDED   . tamsulosin (FLOMAX) 0.4 MG CAPS capsule TAKE 1 CAPSULE BY MOUTH EVERY DAY   . tamsulosin (FLOMAX) 0.4 MG CAPS capsule TAKE 1 CAPSULE BY MOUTH EVERY DAY   . Vitamin D, Ergocalciferol, (DRISDOL) 50000 units CAPS capsule TAKE 1 CAPSULE BY MOUTH EVERY 7 DAYS   . amoxicillin (AMOXIL) 500 MG capsule Reported on 08/27/2015 07/06/2015: For dental work  . Blood Glucose Monitoring Suppl ([place]) W/DEVICE KIT Test up to TID dx: 250.42 (Patient not taking: Reported on 08/15/2016)   . glucose blood (ONETOUCH VERIO) test strip Use as instructed (Patient not taking: Reported on 08/15/2016)   . glucose blood test strip Test up to TID dx: 250.42 (Patient not taking: Reported on 08/15/2016)   . HYDROcodone-acetaminophen (NORCO/VICODIN) 5-325 MG tablet Reported on 08/27/2015 07/06/2015: For dental work  . Insulin Pen Needle (BD PEN NEEDLE NANO U/F) 32G X 4 MM MISC Use to administer levemir insulin  Dx E11.9 (Patient not taking: Reported on 08/15/2016)   . Lancets (ONETOUCH ULTRASOFT) lancets Use as instructed (Patient not taking: Reported on 08/15/2016)   . losartan (COZAAR) 100 MG tablet Take 1 tablet (100 mg total) by mouth daily. (Patient not taking: Reported on 08/15/2016)  No facility-administered encounter medications on file as of 08/15/2016.     Functional Status:   In your present state of health, do you have any difficulty performing the following activities: 08/15/2016  Hearing? N  Vision? Y  Difficulty concentrating or making decisions? Y  Walking or climbing stairs? Y  Dressing or bathing? N  Doing errands, shopping? Y  Preparing Food and eating ? Y  Using the Toilet? N  In the past six months, have you accidently leaked urine? N  Do you have problems with loss of bowel control? N  Managing your Medications? Y  Managing your Finances? Y   Housekeeping or managing your Housekeeping? Y  Some recent data might be hidden    Fall/Depression Screening:    Fall Risk  08/15/2016 07/07/2015 12/14/2014  Falls in the past year? Yes No No  Number falls in past yr: 2 or more - -  Injury with Fall? No - -  Risk Factor Category  High Fall Risk - -  Risk for fall due to : History of fall(s);Impaired balance/gait - -  Follow up Falls prevention discussed - -   PHQ 2/9 Scores 08/15/2016 07/07/2015 12/14/2014 07/23/2013  PHQ - 2 Score 1 0 0 0    Assessment:    Met with member at scheduled time.  Daughter, [name], not available.  Granddaughter present in the home, but not involved during visit.  [facility] care management services explained as member was not aware this care manager was coming today for visit (appointment made with daughter).  Consent signed.  [name] previously reported that member has involvement with [facility] and receive assistance daily.  She also reported that she lived with member and member was only home alone between the time that the home aide left and she [name]) returned.  She stated that she was the primary caregiver to the member, with no help from her siblings, and was looking for more assistance.  Member state that she lives alone and that [place] visits the home about twice a week.  She state that she does not have a home aide that comes in daily, but does have the assistance of her granddaughter, but report that is not consistent.  She has [facility] home [facility] for physical therapy, but does report she feel the need for additional assistance.  She is made aware that social worker referral has already been placed and they will contact her and daughter.  Blood pressure elevated today, she state she has not taken medications yet.  Pill box on table, but broken.  Unable to confirm compliance as pill box is completely full, day it was filled unknown.  New pill box provided, medications reviewed.  Pill box was correct, member in need of  Losartan refill.  Call placed to pharmacy, they report they have already contacted primary MD for refill, no prescription sent as of today.  Member has history of stroke, state she does not monitor blood pressure on a regular basis, does not have machine.  Discussed importance of routine checks, will provide with monitor.  Member provided with [facility] calendar resource book and contact information for this care manager.  She denies any other concerns at this time, advised to contact or have daughter contact with questions. Plan:   Will follow up with daughter to provide update and gain clarification on information received.  Will scheduled home visit for next month at that time. Will place referral to [place] CM Care Plan Problem One  Most Recent Value  Care Plan Problem One  Risk for hospitalization related to stroke as evidenced by uncontrolled blood pressure  Role Documenting the Problem One  Care Management [place] for Problem One  Active  [facility] Long Term Goal   Member's blood pressure will consistently be at or below target range of within the next 31 days  [facility] Long Term Goal Start Date  08/15/16  Interventions for Problem One Long Term Goal  Educated member on importance of hypertension management to include medication compliane in effort to manage blood pressure and decrease risk of stroke.   [facility] CM Short Term Goal #1   Member will have blood pressure monitor within the next 2 weeks  [facility] CM Short Term Goal #1 Start Date  08/15/16  Interventions for Short Term Goal #1  [facility] will provide member with blood pressure monitor.  [facility] CM Short Term Goal #2   Member will report compliance with all medications within the next 4 weeks  [facility] CM Short Term Goal #2 Start Date  08/15/16  Interventions for Short Term Goal #2  Pharmacy referral placed for medication management & review.  New pill box provided & filled by this care manager     Valente David, RN, MSN [place] Manager [phone]

## 2016-08-15 NOTE — Telephone Encounter (Signed)
Reporting that BP was outside parameters, BP today was 180/90 Pt forgot to take her meds yesterday

## 2016-08-17 ENCOUNTER — Encounter: Payer: Self-pay | Admitting: *Deleted

## 2016-08-17 DIAGNOSIS — I69354 Hemiplegia and hemiparesis following cerebral infarction affecting left non-dominant side: Secondary | ICD-10-CM | POA: Diagnosis not present

## 2016-08-17 DIAGNOSIS — E1121 Type 2 diabetes mellitus with diabetic nephropathy: Secondary | ICD-10-CM | POA: Diagnosis not present

## 2016-08-18 ENCOUNTER — Other Ambulatory Visit: Payer: Self-pay | Admitting: *Deleted

## 2016-08-18 ENCOUNTER — Ambulatory Visit: Payer: Medicare Other | Admitting: *Deleted

## 2016-08-18 NOTE — Patient Outreach (Signed)
[facility]) Care Management  08/18/2016  Oliviana Mcgahee 02-17-1945 [id number]   Attempted to contact member's daughter, [name], at listed phone number (same number previously contacted through) to follow up on home visit with member and to collaborate care.  Recording states number can't be completed as dialed.  This care manager verified number was correct, attempted again, same recording.  Unable to contact daughter, will follow up with member again within the next 2 weeks.  Valente David, [place], MSN [place] [phone]

## 2016-08-21 ENCOUNTER — Telehealth: Payer: Self-pay | Admitting: Internal Medicine

## 2016-08-21 DIAGNOSIS — I69354 Hemiplegia and hemiparesis following cerebral infarction affecting left non-dominant side: Secondary | ICD-10-CM | POA: Diagnosis not present

## 2016-08-21 DIAGNOSIS — E1121 Type 2 diabetes mellitus with diabetic nephropathy: Secondary | ICD-10-CM | POA: Diagnosis not present

## 2016-08-21 NOTE — Telephone Encounter (Signed)
Patients bp 170/90 on both arms today.  Patient may either be out of losartan or is not taking medication.  States she has left a vm for daughter to giver her a call b/c daughter fixes pill box.

## 2016-08-22 ENCOUNTER — Other Ambulatory Visit: Payer: Self-pay | Admitting: Internal Medicine

## 2016-08-22 ENCOUNTER — Telehealth: Payer: Self-pay | Admitting: Internal Medicine

## 2016-08-22 ENCOUNTER — Encounter: Payer: Self-pay | Admitting: *Deleted

## 2016-08-22 ENCOUNTER — Other Ambulatory Visit: Payer: Self-pay | Admitting: *Deleted

## 2016-08-22 ENCOUNTER — Telehealth: Payer: Self-pay | Admitting: Pharmacist

## 2016-08-22 NOTE — Patient Outreach (Signed)
[facility]) Care Management  08/22/2016  Michelle Lopez 1945-06-14 [id number]   71 year old female referred to [place] by [place] for medication review and medication adherence.  PMHx includes, but not limited to, type 2 diabetes, CVA, HTN, HLD, PAD, dementia, depression, osteoarthritis, and chronic neurological symptoms including ataxia, frequent falls, and weakness in both upper and lower extremities.    PCP: Scarlette Calico  Unsuccessful telephone outreach call to patient today.  Left HIPAA compliant voicemail.    Plan: Call patient within the next week unless I hear back from patient or daughter first.   Ralene Bathe, PharmD, [place] [phone]

## 2016-08-22 NOTE — Patient Outreach (Signed)
[facility]) Care Management  08/22/2016  Michelle Lopez 02-28-1945 [id number]   CSW was able to make initial contact with patient's daughter, [name] today to perform phone assessment, as well as assess and assist with social work needs and services for patient.  CSW introduced self, explained role and types of services provided through [place] Management ([place] Management). CSW further explained to Mrs. Maricela Bo that [place] works with patient's RNCM, also with [place] Management, Valente David. CSW then explained the reason for the call, indicating that Mrs. Orene Desanctis thought that patient would benefit from social work services and resources to assist with arranging transportation to and from physician appointments, as well as assisting patient with obtaining additional hours of care and supervision inside and out of the home.  CSW obtained two HIPAA compliant identifiers from Mrs. Maricela Bo, which included patient's name and date of birth. Mrs. Maricela Bo reported that patient is already receiving the maximum amount of home care service hours allotted through [facility] Forensic scientist), with the [place], which is currently 40 hours per week.  Mrs. Maricela Bo indicated that patient is on a very fixed income each month, being an Adult Medicaid recipient and only receiving a minimal check through [employer].  Mrs. Maricela Bo knows that patient is unable to afford to pay for additional home care service hours, reporting that she may be interested in an adult day care program for patient.  CSW spoke with Mrs. Maricela Bo at length about the ACE ([facility]) and PACE ([facility] [place] [facility]) Programs, also agreeing to place information in the mail to her at the address provided: [address], [place], [place] [address]. In addition, CSW spoke with  Mrs. Maricela Bo about transportation resources, as patient is having a difficult time getting to and from her scheduled physician appointments, unless Mrs. Maricela Bo is available to transport her.  Mrs. Maricela Bo was not agreeable to [employer] Paramedic) services for patient, but was willing to Paediatric nurse through [employer] [facility] [place].  CSW agreed to make the appropriate referral to [employer], as well as mail Mrs. Maricela Bo a brochure.  Mrs. Maricela Bo then needed to end the conversation, agreeing to contact CSW directly, as soon as the packet of resource information is received in the mail.  CSW was able to ensure that Mrs. Maricela Bo has the correct contact information for CSW.  CSW will make contact with Mrs. Maricela Bo, if CSW does not receive a return call by the beginning of next week.  Mrs. Maricela Bo voiced understanding and was agreeable to this plan. Nat Christen, BSW, MSW, LCSW  Licensed Education officer, environmental Health System  Mailing [place] [address], [place], [place] [address] Physical Address-[address], [place], [place] [address] Toll Free Main # [phone] Fax # [phone] Cell # [phone]  Office # [phone] [email or web address]

## 2016-08-22 NOTE — Telephone Encounter (Signed)
[facility]  [id number]  Fyi her bp is 168/80

## 2016-08-23 NOTE — Telephone Encounter (Signed)
Contacted [facility] and they have not been able to get the daughter on the phone.  Called number for [facility]tr [facility]) but the number listed is incorrect.   Pt was not taking the losartan but has started back as of Sunday.   Please advise if there are any changes or recommendations.

## 2016-08-23 NOTE — Telephone Encounter (Signed)
There are several notes regarding BP.  Closing this note.

## 2016-08-24 DIAGNOSIS — E1121 Type 2 diabetes mellitus with diabetic nephropathy: Secondary | ICD-10-CM | POA: Diagnosis not present

## 2016-08-24 DIAGNOSIS — I69354 Hemiplegia and hemiparesis following cerebral infarction affecting left non-dominant side: Secondary | ICD-10-CM | POA: Diagnosis not present

## 2016-08-25 ENCOUNTER — Telehealth: Payer: Self-pay | Admitting: *Deleted

## 2016-08-25 ENCOUNTER — Ambulatory Visit: Payer: Self-pay | Admitting: Pharmacist

## 2016-08-25 ENCOUNTER — Other Ambulatory Visit: Payer: Self-pay | Admitting: Pharmacist

## 2016-08-25 DIAGNOSIS — I69354 Hemiplegia and hemiparesis following cerebral infarction affecting left non-dominant side: Secondary | ICD-10-CM | POA: Diagnosis not present

## 2016-08-25 DIAGNOSIS — E1121 Type 2 diabetes mellitus with diabetic nephropathy: Secondary | ICD-10-CM | POA: Diagnosis not present

## 2016-08-25 MED ORDER — ONETOUCH ULTRASOFT LANCETS MISC: 1 refills | Status: DC

## 2016-08-25 MED ORDER — GLUCOSE BLOOD VI STRP: ORAL_STRIP | 1 refills | Status: DC

## 2016-08-25 NOTE — Telephone Encounter (Signed)
Sent rx's to [place]...[name]

## 2016-08-25 NOTE — Patient Outreach (Addendum)
[facility] ([facility]) Care Management  [facility] CM Pharmacy   08/25/2016  Johnell Fickling 11/28/1945 [id number]  Subjective: 71 year old female referred to [facility] pharmacy by [facility] CM RN Monica Lane for medication review and medication adherence.  PMHx includes, but not limited to, type 2 diabetes, chronic kidney disease, CVA, HTN, HLD, PAD, dementia, depression, osteoarthritis, and chronic neurological symptoms including ataxia, frequent falls, and weakness in both upper and lower extremities.    Successful telephone outreach call to patient's daughter, Marian.  HIPAA identifiers verified. Daughter states she assists patient with her medications.  Medications are delivered by pharmacy to patient's home in pill bottles.  One medication, metoprolol, needs to be split in half by daughter.  Daughter is very interested in bubble packing of medications and in having pharmacy split pills for patient.  Daughter states they have no issues with medication costs.  Patient is out of her test strips and lancets but is supposed to be checking her blood sugar twice daily.  She has been off insulin for about a year per daughter.    Objective:  Hemoglobin A1C = 6.9 (07/24/2016) SCr = 1.05 (07/24/2016)  Encounter Medications: Outpatient Encounter Prescriptions as of 08/25/2016  Medication Sig Note  . aspirin 81 MG tablet Take 81 mg by mouth daily.     . atorvastatin (LIPITOR) 20 MG tablet TAKE 1 TABLET BY MOUTH ONCE DAILY.   . cetirizine (ZYRTEC) 10 MG tablet TAKE 1 TABLET BY MOUTH ONCE DAILY   . clopidogrel (PLAVIX) 75 MG tablet Take 1 tablet (75 mg total) by mouth daily.   . furosemide (LASIX) 20 MG tablet TAKE 1 TABLET BY MOUTH EVERY DAY   . glucose blood (ONETOUCH VERIO) test strip Use as instructed   . Lancets (ONETOUCH ULTRASOFT) lancets Use as instructed   . losartan (COZAAR) 100 MG tablet TAKE 1 TABLET BY MOUTH ONCE DAILY   . metFORMIN (GLUCOPHAGE) 1000 MG tablet TAKE 1 TABLET BY MOUTH TWICE DAILY WITH A  MEAL   . metoprolol tartrate (LOPRESSOR) 25 MG tablet TAKE 1/2 TABLET BY MOUTH TWICE DAILY   . SILENOR 6 MG TABS TAKE 1 TABLET BY MOUTH EVERY NIGHT AT BEDTIME AS NEEDED   . tamsulosin (FLOMAX) 0.4 MG CAPS capsule TAKE 1 CAPSULE BY MOUTH EVERY DAY   . Vitamin D, Ergocalciferol, (DRISDOL) 50000 units CAPS capsule TAKE 1 CAPSULE BY MOUTH EVERY 7 DAYS   . Blood Glucose Monitoring Suppl (ONE TOUCH BASIC SYSTEM) W/DEVICE KIT Test up to TID dx: 250.42 (Patient not taking: Reported on 08/15/2016)   . linagliptin (TRADJENTA) 5 MG TABS tablet Take 5 mg by mouth daily.   . [DISCONTINUED] amoxicillin (AMOXIL) 500 MG capsule Reported on 08/27/2015 07/06/2015: For dental work  . [DISCONTINUED] atorvastatin (LIPITOR) 20 MG tablet Take 1 tablet (20 mg total) by mouth daily.   . [DISCONTINUED] clopidogrel (PLAVIX) 75 MG tablet TAKE 1 TABLET BY MOUTH EVERY DAY   . [DISCONTINUED] glucose blood test strip Test up to TID dx: 250.42 (Patient not taking: Reported on 08/15/2016)   . [DISCONTINUED] HYDROcodone-acetaminophen (NORCO/VICODIN) 5-325 MG tablet Reported on 08/27/2015 07/06/2015: For dental work  . [DISCONTINUED] Insulin Pen Needle (BD PEN NEEDLE NANO U/F) 32G X 4 MM MISC Use to administer levemir insulin  Dx E11.9 (Patient not taking: Reported on 08/15/2016)   . [DISCONTINUED] tamsulosin (FLOMAX) 0.4 MG CAPS capsule TAKE 1 CAPSULE BY MOUTH EVERY DAY    No facility-administered encounter medications on file as of 08/25/2016.       Functional Status: In your present state of health, do you have any difficulty performing the following activities: 08/22/2016 08/15/2016  Hearing? N N  Vision? Y Y  Difficulty concentrating or making decisions? Y Y  Walking or climbing stairs? Y Y  Dressing or bathing? Y N  Doing errands, shopping? Y Y  Preparing Food and eating ? Y Y  Using the Toilet? N N  In the past six months, have you accidently leaked urine? Y N  Do you have problems with loss of bowel control? N N  Managing  your Medications? Y Y  Managing your Finances? Y Y  Housekeeping or managing your Housekeeping? Y Y  Some recent data might be hidden    Fall/Depression Screening: Fall Risk  08/22/2016 08/15/2016 07/07/2015  Falls in the past year? Yes Yes No  Number falls in past yr: 2 or more 2 or more -  Injury with Fall? No No -  Risk Factor Category  High Fall Risk High Fall Risk -  Risk for fall due to : History of fall(s);Impaired balance/gait;Impaired mobility;Mental status change History of fall(s);Impaired balance/gait -  Follow up Education provided;Falls prevention discussed Falls prevention discussed -   PHQ 2/9 Scores 08/22/2016 08/15/2016 07/07/2015 12/14/2014 07/23/2013  PHQ - 2 Score 1 1 0 0 0  Exception Documentation Medical reason - - - -      Assessment:  Drugs sorted by system:  Neurologic/Psychologic: Doxepin  Cardiovascular: Aspirin 81mg, atorvastatin, clopidogrel, furosemide, losartan, metoprolol  Pulmonary/Allergy: Cetirizine  Endocrine:Metformin  Vitamins/Minerals: Ergocalciferol  Miscellaneous:Tamsulosin  Duplications in therapy:  -Several duplications on medication list.  Removed duplicates including atorvastatin, clopidogrel, losartan, metformin, and tamsulosin. Removed medications no longer taking including amoxicillin, hydrocodone-acetaminophen, and linagliptin.   Medications to avoid in the elderly:  -Doxepin is a tricyclic antidepressant with anticholinergic side effects and may cause confusion and over sedation in the elderly.  Monitor closely and address as clinically warranted.    Other issues noted:  -Allergies to simvastatin, pravastatin, rosuvastatin however patient tolerating atorvastatin per daughter  -Still taking linagliptin per daughter but this medication was stopped according to office visit with Dr. Jones on 07/24/16.  [facility] had no record of filling this prescription for patient.  Daughter states she had an old bottle of  linagliptin but will remove this medication from her mom's pillbox.    -Needs refills on test strips and lancets.  Called provider's office who will contact Dr. Gherghe's office for refills.  Requested refills be called into [facility] on [address] which can bill Medicare Part B for diabetic testing supplies.    -Chronic kidney disease: CrCl ~44 ml/min, eGFR ~66 ml/min, monitor closely for dose adjustments with metformin  -Daughter very interested in medications being blister-packed.  I contacted [facility] who will transfer prescriptions, blister-pack, and deliver to patient at no extra charge.    Plan: Route note to provider.  Follow-up with patient's daughter and [facility] in 3 weeks to see if prescriptions are ready to be refilled.    , PharmD, BCPS Clinical Pharmacist [facility] [phone]     

## 2016-08-25 NOTE — Telephone Encounter (Signed)
Rec'd call from nurse w/[facility] pt is needing refills on her testing strips and lancets. Per chart pt saw Dr. Ronnald Ramp on 07/24/16 f/u on BP and diabetes. Will send updated script to [facility].../lmb   Someone is in the patient chart and not able to send rx's at this time. Will re-try once chart is closed...[name]

## 2016-08-28 DIAGNOSIS — E1121 Type 2 diabetes mellitus with diabetic nephropathy: Secondary | ICD-10-CM | POA: Diagnosis not present

## 2016-08-28 DIAGNOSIS — I69354 Hemiplegia and hemiparesis following cerebral infarction affecting left non-dominant side: Secondary | ICD-10-CM | POA: Diagnosis not present

## 2016-08-29 ENCOUNTER — Other Ambulatory Visit: Payer: Self-pay | Admitting: *Deleted

## 2016-08-29 NOTE — Patient Outreach (Signed)
Request received from Joanna Saporito, LCSW to mail patient personal care resources.  Information mailed today. 

## 2016-08-29 NOTE — Patient Outreach (Signed)
[facility]) Care Management  08/29/2016  Michelle Lopez [birth year] [id number]   CSW was able to make brief contact with patient's daughter, Michelle Lopez today to follow-up regarding social work services and resources for patient.  Michelle Lopez was unable to confirm, nor deny, receiving the packet of resource information that CSW mailed to her home.  Michelle Lopez reported that she is currently on her way home and has not had a chance to check her mail since Saturday.  Michelle Lopez agreed to contact CSW when she returns home.  CSW is currently awaiting a return call.  If CSW does not receive a return call from Michelle Lopez within the next week, CSW will make another outreach attempts. Michelle Lopez, Michelle Lopez, Michelle Lopez, Michelle Lopez  Licensed Education officer, environmental Health System  Mailing [employer] [address], [place], [place] [address] Physical Address-[address], [place], [place] [address] Toll Free Main # [phone] Fax # [phone] Cell # [phone]  Office # [phone] [email or web address]

## 2016-08-31 ENCOUNTER — Other Ambulatory Visit: Payer: Self-pay | Admitting: *Deleted

## 2016-08-31 DIAGNOSIS — H402211 Chronic angle-closure glaucoma, right eye, mild stage: Secondary | ICD-10-CM | POA: Diagnosis not present

## 2016-08-31 DIAGNOSIS — H04123 Dry eye syndrome of bilateral lacrimal glands: Secondary | ICD-10-CM | POA: Diagnosis not present

## 2016-08-31 DIAGNOSIS — H402222 Chronic angle-closure glaucoma, left eye, moderate stage: Secondary | ICD-10-CM | POA: Diagnosis not present

## 2016-08-31 NOTE — Patient Outreach (Signed)
[facility]) Care Management  08/31/2016  Jeweldean Drohan [birth year] [id number]   Call placed to member's daughter, [name], to follow up on current health status.  She state they are at the MD office at this time, will call back when the visit is complete.  Valente David, [place], MSN [place] [phone]

## 2016-09-05 ENCOUNTER — Other Ambulatory Visit: Payer: Self-pay | Admitting: *Deleted

## 2016-09-05 NOTE — Patient Outreach (Signed)
[facility]) Care Management  09/05/2016  Michelle Lopez [birth year] [id number]   CSW made several attempts to try and contact patient's daughter, [name] today; however, was unsuccessful.  CSW received several automated recordings, stating "your call cannot be completed as dialed, please check the number and try your call again".  CSW then made an attempt to try and contact patient to follow-up regarding social work services and resources; however, patient was unavailable.  A HIPAA compliant message was left for patient on voicemail.  CSW is currently awaiting a return call. CSW will make a second outreach attempt within the next week, if CSW does not receive a return call from patient in the meantime. Nat Christen, BSW, MSW, LCSW  Licensed Education officer, environmental Health System  Mailing [place] [address], [place], [place] [address] Physical Address-[address], [place], [place] [address] Toll Free Main # [phone] Fax # [phone] Cell # [phone]  Office # [phone] [email or web address]

## 2016-09-08 ENCOUNTER — Other Ambulatory Visit: Payer: Self-pay | Admitting: *Deleted

## 2016-09-08 NOTE — Patient Outreach (Signed)
[facility] [facility]) Care Management  09/08/2016  Michelle Lopez Dec 02, 1945 [id number]   Call placed to daughter to follow up on current health status and contact with social worker.  She report that the member is doing well, no concerns at this time.  She report she has been taking medications as prescribed.  She state she has not been in recent contact with LCSW, J. Saporito, regarding additional in home assistance as requested.  Notified that Mrs. Saporito has been trying to make contact, phone number provided, advised to call as soon as possible.  She state she is in the process of making plans for member to move in with her after her lease is up.  Daughter agree to follow up home visit next week, if no nursing concerns will close case.  Valente David, [place], MSN [place] [phone]

## 2016-09-11 ENCOUNTER — Other Ambulatory Visit: Payer: Self-pay | Admitting: *Deleted

## 2016-09-11 NOTE — Patient Outreach (Signed)
[name]) Care Management   09/11/2016  Michelle Lopez [birth year] [id number]  Michelle Lopez is an 71 y.o. female  Subjective:   Member alert and oriented x3, denies pain or discomfort at this time.  Reports compliance with medications.  Objective:   Review of Systems  Constitutional: Negative.   HENT: Negative.   Eyes: Negative.   Respiratory: Negative.   Cardiovascular: Negative.   Gastrointestinal: Negative.   Genitourinary: Negative.   Musculoskeletal: Negative.   Skin: Negative.   Neurological: Negative.   Endo/Heme/Allergies: Negative.   Psychiatric/Behavioral: Negative.     Physical Exam  Constitutional: She is oriented to person, place, and time. She appears well-developed and well-nourished.  Neck: Normal range of motion.  Cardiovascular: Normal rate, regular rhythm and normal heart sounds.   Respiratory: Effort normal and breath sounds normal.  GI: Soft. Bowel sounds are normal.  Musculoskeletal: Normal range of motion.  Neurological: She is alert and oriented to person, place, and time.  Skin: Skin is warm and dry.   BP (!) 160/79   Pulse 70   Resp 18   Encounter Medications:   Outpatient Encounter Prescriptions as of 09/11/2016  Medication Sig  . aspirin 81 MG tablet Take 81 mg by mouth daily.    Marland Kitchen atorvastatin (LIPITOR) 20 MG tablet TAKE 1 TABLET BY MOUTH ONCE DAILY.  Marland Kitchen Blood Glucose Monitoring Suppl ([place]) W/DEVICE KIT Test up to TID dx: 250.42 (Patient not taking: Reported on 08/15/2016)  . cetirizine (ZYRTEC) 10 MG tablet TAKE 1 TABLET BY MOUTH ONCE DAILY  . clopidogrel (PLAVIX) 75 MG tablet Take 1 tablet (75 mg total) by mouth daily.  . furosemide (LASIX) 20 MG tablet TAKE 1 TABLET BY MOUTH EVERY DAY  . glucose blood (ONETOUCH VERIO) test strip Use to check blood sugars twice a day (Patient not taking: Reported on 08/25/2016)  . Lancets (ONETOUCH ULTRASOFT) lancets Use to check blood sugars twice a day (Patient not  taking: Reported on 08/25/2016)  . losartan (COZAAR) 100 MG tablet TAKE 1 TABLET BY MOUTH ONCE DAILY  . metFORMIN (GLUCOPHAGE) 1000 MG tablet TAKE 1 TABLET BY MOUTH TWICE DAILY WITH A MEAL  . metoprolol tartrate (LOPRESSOR) 25 MG tablet TAKE 1/2 TABLET BY MOUTH TWICE DAILY  . SILENOR 6 MG TABS TAKE 1 TABLET BY MOUTH EVERY NIGHT AT BEDTIME AS NEEDED  . tamsulosin (FLOMAX) 0.4 MG CAPS capsule TAKE 1 CAPSULE BY MOUTH EVERY DAY  . Vitamin D, Ergocalciferol, (DRISDOL) 50000 units CAPS capsule TAKE 1 CAPSULE BY MOUTH EVERY 7 DAYS   No facility-administered encounter medications on file as of 09/11/2016.     Functional Status:   In your present state of health, do you have any difficulty performing the following activities: 08/22/2016 08/15/2016  Hearing? N N  Vision? Y Y  Difficulty concentrating or making decisions? [name]  Walking or climbing stairs? Y Y  Dressing or bathing? Y N  Doing errands, shopping? [name]  Preparing Food and eating ? Y Y  Using the Toilet? N N  In the past six months, have you accidently leaked urine? Y N  Do you have problems with loss of bowel control? N N  Managing your Medications? Y Y  Managing your Finances? [name]  Housekeeping or managing your Housekeeping? Y Y  Some recent data might be hidden    Fall/Depression Screening:    Fall Risk  08/22/2016 08/15/2016 07/07/2015  Falls in the past year? Yes Yes No  Number  falls in past yr: 2 or more 2 or more -  Injury with Fall? No No -  Risk Factor Category  High Fall Risk High Fall Risk -  Risk for fall due to : History of fall(s);Impaired balance/gait;Impaired mobility;Mental status change History of fall(s);Impaired balance/gait -  Follow up Education provided;Falls prevention discussed Falls prevention discussed -   PHQ 2/9 Scores 08/22/2016 08/15/2016 07/07/2015 12/14/2014 07/23/2013  PHQ - 2 Score 1 1 0 0 0  Exception Documentation Medical reason - - - -    Assessment:    Met member in the home at scheduled time.   Daughter was to meet this care manager to review use of blood pressure monitor and education regarding hypertension management, however she was unavailable.  Member educated on importance of taking all medications and daily blood pressure checks.  Demonstrated correct use of machine, instructions left for daughter as well and business card for daughter to call with questions.    Member is unaware if daughter has been in contact with LCSW regarding in home assistance.  She is unable to provide any additional information as her daughter is her caregiver and a better historian.  She denies any questions/concerns at this time.  Plan:   Will follow up with daughter regarding use of blood pressure monitor/possible questions and contact with Education officer, museum.  Will follow up with home visit next month.  THN CM Care Plan Problem One     Most Recent Value  Care Plan Problem One  Risk for hospitalization related to stroke as evidenced by uncontrolled blood pressure  Role Documenting the Problem One  Care Management [name] for Problem One  Active  THN Long Term Goal   Member's blood pressure will consistently be at or below target range of within the next 31 days  THN Long Term Goal Start Date  09/11/16 [Goal reset, not met]  Interventions for Problem One Long Term Goal  Re-educated member on importance of hypertension management to include medication compliane in effort to manage blood pressure and decrease risk of stroke. Provided with blood pressure monitor, demonstration of use provided   THN CM Short Term Goal #1   Member will have blood pressure monitor within the next 2 weeks  THN CM Short Term Goal #1 Start Date  08/15/16  [facility] CM Short Term Goal #1 Met Date  09/11/16  Interventions for Short Term Goal #1  [facility] will provide member with blood pressure monitor.  THN CM Short Term Goal #2   Member will report compliance with all medications within the next 4 weeks  THN CM Short Term Goal  #2 Start Date  08/15/16  [facility] CM Short Term Goal #2 Met Date  09/11/16  Interventions for Short Term Goal #2  Pharmacy referral placed for medication management & review.  New pill box provided & filled by this care manager     Valente David, RN, MSN [place] [phone]     .

## 2016-09-12 ENCOUNTER — Other Ambulatory Visit: Payer: Self-pay | Admitting: *Deleted

## 2016-09-12 NOTE — Patient Outreach (Signed)
[place] [facility]) Care Management  09/12/2016  Michelle Lopez 08-Apr-1945 [id number]   CSW was able to make brief contact with patient's daughter, Michelle Lopez today to follow-up regarding social work services and resources for patient.  Mrs. Michelle Lopez admitted that she was unable to talk at the time of CSW's call, as she was at work.  Mrs. Michelle Lopez agreed to contact CSW around 3:30pm today, after she gets off of work.  CSW is currently awaiting a return call.  If CSW does not receive a return call from Mrs. Michelle Lopez within the next week, CSW will make another outreach attempt. Nat Christen, BSW, MSW, LCSW  Licensed Education officer, environmental Health System  Mailing [place] [address], [place], [place] [address] Physical Address-[address], [place], [place] [address] Toll Free Main # [phone] Fax # [phone] Cell # [phone]  Office # [phone] [email or web address]

## 2016-09-14 ENCOUNTER — Encounter: Payer: Self-pay | Admitting: Internal Medicine

## 2016-09-14 ENCOUNTER — Ambulatory Visit (INDEPENDENT_AMBULATORY_CARE_PROVIDER_SITE_OTHER): Payer: Medicare Other | Admitting: Internal Medicine

## 2016-09-14 VITALS — BP 174/98 | HR 61 | Ht 60.0 in | Wt 127.0 lb

## 2016-09-14 DIAGNOSIS — I6502 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: Secondary | ICD-10-CM | POA: Diagnosis not present

## 2016-09-14 DIAGNOSIS — I10 Essential (primary) hypertension: Secondary | ICD-10-CM | POA: Diagnosis not present

## 2016-09-14 MED ORDER — AMLODIPINE BESYLATE 5 MG PO TABS: 5.0000 mg | ORAL_TABLET | Freq: Every day | ORAL | 3 refills | Status: DC

## 2016-09-14 NOTE — Progress Notes (Signed)
Subjective:    Patient ID: Michelle Lopez, female    DOB: 09-16-1945, 71 y.o.   MRN: [id number]  HPI  Here to f/u; overall doing ok,  Pt denies chest pain, increasing sob or doe, wheezing, orthopnea, PND, increased LE swelling, palpitations, dizziness or syncope.  Pt denies new neurological symptoms such as new headache, or facial or extremity weakness or numbness.  Pt denies polydipsia, polyuria, or low sugar episode.  Pt states overall good compliance with meds, mostly trying to follow appropriate diet, with wt overall stable,  But BP at home has been consistently < 140/90 Past Medical History:  Diagnosis Date  . Cerebrovascular disease, unspecified   . Cocaine abuse, unspecified   . Diabetes mellitus    type 2, uncontrolled  . Diarrhea   . Essential hypertension, benign   . Hyperlipidemia   . Stroke [facility])    x7   Past Surgical History:  Procedure Laterality Date  . TOTAL HIP ARTHROPLASTY  1970   Dr. Rodell Perna    reports that she has never smoked. She has never used smokeless tobacco. She reports that she does not drink alcohol or use drugs. family history is not on file. Allergies  Allergen Reactions  . Crestor [Rosuvastatin Calcium]     myalgias  . Morphine Hives  . Pravastatin Sodium Rash  . Simvastatin Rash   Current Outpatient Prescriptions on File Prior to Visit  Medication Sig Dispense Refill  . aspirin 81 MG tablet Take 81 mg by mouth daily.      Marland Kitchen atorvastatin (LIPITOR) 20 MG tablet TAKE 1 TABLET BY MOUTH ONCE DAILY. 90 tablet 1  . Blood Glucose Monitoring Suppl (ONE TOUCH BASIC SYSTEM) W/DEVICE KIT Test up to TID dx: 250.42 1 each 1  . cetirizine (ZYRTEC) 10 MG tablet TAKE 1 TABLET BY MOUTH ONCE DAILY 30 tablet 11  . clopidogrel (PLAVIX) 75 MG tablet Take 1 tablet (75 mg total) by mouth daily. 90 tablet 1  . furosemide (LASIX) 20 MG tablet TAKE 1 TABLET BY MOUTH EVERY DAY 30 tablet 9  . glucose blood (ONETOUCH VERIO) test strip Use to check blood sugars twice a  day 300 each 1  . Lancets (ONETOUCH ULTRASOFT) lancets Use to check blood sugars twice a day 300 each 1  . losartan (COZAAR) 100 MG tablet TAKE 1 TABLET BY MOUTH ONCE DAILY 90 tablet 1  . metFORMIN (GLUCOPHAGE) 1000 MG tablet TAKE 1 TABLET BY MOUTH TWICE DAILY WITH A MEAL 180 tablet 1  . metoprolol tartrate (LOPRESSOR) 25 MG tablet TAKE 1/2 TABLET BY MOUTH TWICE DAILY 180 tablet 1  . SILENOR 6 MG TABS TAKE 1 TABLET BY MOUTH EVERY NIGHT AT BEDTIME AS NEEDED 90 tablet 3  . tamsulosin (FLOMAX) 0.4 MG CAPS capsule TAKE 1 CAPSULE BY MOUTH EVERY DAY 90 capsule 1  . Vitamin D, Ergocalciferol, (DRISDOL) 50000 units CAPS capsule TAKE 1 CAPSULE BY MOUTH EVERY 7 DAYS 12 capsule 3  . [DISCONTINUED] glipiZIDE (GLUCOTROL) 5 MG tablet Take 5 mg by mouth 2 (two) times daily before a meal.     No current facility-administered medications on file prior to visit.    Review of Systems All otherwise neg per pt    Objective:   Physical Exam BP (!) 174/98   Pulse 61   Ht 5' (1.524 m)   Wt 127 lb (57.6 kg)   SpO2 98%   BMI 24.80 kg/m  VS noted,  Constitutional: Pt appears in NAD HENT: Head: NCAT.  Right Ear: External ear normal.  Left Ear: External ear normal.  Eyes: . Pupils are equal, round, and reactive to light. Conjunctivae and EOM are normal Nose: without d/c or deformity Neck: Neck supple. Gross normal ROM Cardiovascular: Normal rate and regular rhythm.   Pulmonary/Chest: Effort normal and breath sounds without rales or wheezing.  Neurological: Pt is alert. At baseline orientation, motor grossly intact Skin: Skin is warm. No rashes, other new lesions, no LE edema Psychiatric: Pt behavior is normal without agitation  No other exam findings Lab Results  Component Value Date   WBC 6.7 07/24/2016   HGB 14.2 07/24/2016   HCT 45.4 07/24/2016   PLT 266.0 07/24/2016   GLUCOSE 141 (H) 07/24/2016   CHOL 177 07/24/2016   TRIG 109.0 07/24/2016   HDL 43.90 07/24/2016   LDLDIRECT 100.0 07/06/2015    LDLCALC 111 (H) 07/24/2016   ALT 6 07/24/2016   AST 11 07/24/2016   NA 140 07/24/2016   K 4.1 07/24/2016   CL 101 07/24/2016   CREATININE 1.05 07/24/2016   BUN 13 07/24/2016   CO2 32 07/24/2016   TSH 2.25 07/24/2016   INR 1.1 (H) 02/25/2015   HGBA1C 6.9 (H) 07/24/2016   MICROALBUR 0.7 07/24/2016       Assessment & Plan:

## 2016-09-14 NOTE — Patient Instructions (Signed)
Please take all new medication as prescribed - the amlodipine 5 mg per day  Please continue all other medications as before, and refills have been done if requested.  Please have the pharmacy call with any other refills you may need.  Please keep your appointments with your specialists as you may have planned  Please return in 3 weeks, or sooner if needed, to Dr Ronnald Ramp

## 2016-09-16 NOTE — Assessment & Plan Note (Signed)
Mild uncontrolled, to add amlodipine 5 mg daily, monitor BP at home and f/u with PCP

## 2016-09-18 ENCOUNTER — Other Ambulatory Visit: Payer: Self-pay | Admitting: Pharmacist

## 2016-09-18 ENCOUNTER — Encounter: Payer: Self-pay | Admitting: *Deleted

## 2016-09-18 ENCOUNTER — Ambulatory Visit: Payer: Self-pay | Admitting: Pharmacist

## 2016-09-18 NOTE — Patient Outreach (Signed)
[facility]) Care Management  09/18/2016  Michelle Lopez [birth year] [id number]  Per review of [facility], noted patient recently started on Norvasc 5mg  daily per office visit with PCP.    Unsuccessful telephone outreach call to Michelle Lopez and her daughter, Michelle Lopez. I left a HIPAA compliant voicemail on patient's home phone.  The number listed for Michelle Lopez was not in service.    Plan: I will follow-up with patient and her daughter later this week unless I hear back from them beforehand in regards to medication bubble-packing and delivery and new medication, Norvasc.   Ralene Bathe, PharmD, [place] [phone]

## 2016-09-19 ENCOUNTER — Other Ambulatory Visit: Payer: Self-pay | Admitting: *Deleted

## 2016-09-19 ENCOUNTER — Ambulatory Visit: Payer: Self-pay | Admitting: Pharmacist

## 2016-09-19 ENCOUNTER — Telehealth: Payer: Self-pay | Admitting: Pharmacist

## 2016-09-19 NOTE — Patient Outreach (Signed)
[place] [facility]) Care Management  09/19/2016  Michelle Lopez Jun 25, 1945 [id number]  Successful outreach call to Ms. Nieto and her daughter, Michelle Lopez, to follow-up on medications being transferred to [facility] for bubble-packing and delivery services.  HIPAA identifiers verified.  Michelle Lopez initially states her mother has been out of all her medications since Sunday but has not called any pharmacy for refills.  After speaking with pharmacists from [facility] and [facility], patient actually had medications for amlodipine, atorvastatin, clopidogrel, metoprolol, and metformin filled for 90 day supply in July at [facility] therefore it is too early to fill them right now.  Patient's other medications, aspirin, cetirizine, furosemide, silenor, tamsulosin, and vitamin D are able to be filled and delivered.  Michelle Lopez will call [place] to set up a specific day for delivery.   Plan: I will follow-up with Michelle Lopez next week to see how patient is doing with her medications in bubble-pack.   Ralene Bathe, PharmD, [place] [phone]

## 2016-09-19 NOTE — Patient Outreach (Signed)
[place] [facility] Care Management  09/19/2016  Michelle Lopez January 28, 1946 [id number]   CSW made a second attempt to try and contact patient's daughter, Michelle Lopez today to follow-up regarding social work services and resources, without success.  A HIPAA compliant message was left on voicemail.  CSW is currently awaiting a return call.  CSW will make a third and final outreach attempt within the next week, if CSW does not receive a return call from Michelle Lopez in the meantime. Nat Christen, BSW, MSW, LCSW  Licensed Education officer, environmental Health System  Mailing [place] [address] [place], [place] [address] Physical Address-[address], [place], [place] [address] Toll Free Main # [phone] Fax # [phone] Cell # [phone]  Office # [phone] [email or web address]

## 2016-09-25 ENCOUNTER — Other Ambulatory Visit: Payer: Self-pay | Admitting: Pharmacist

## 2016-09-25 ENCOUNTER — Ambulatory Visit: Payer: Self-pay | Admitting: Pharmacist

## 2016-09-25 NOTE — Patient Outreach (Signed)
[facility] [facility]) Care Management  09/25/2016  Yazleen Molock [birth year] [id number]   Unsuccessful outreach attempt today to reach patient's daughter, [name].  I left a HIPAA compliant voicemail at home phone [phone].  Marian's home phone number is not currently in service [phone]).    Plan: I will follow-up with patient and family later this week.   Ralene Bathe, PharmD, [place] [phone]

## 2016-09-26 ENCOUNTER — Other Ambulatory Visit: Payer: Self-pay | Admitting: *Deleted

## 2016-09-26 ENCOUNTER — Telehealth: Payer: Self-pay | Admitting: Pharmacist

## 2016-09-26 ENCOUNTER — Encounter: Payer: Self-pay | Admitting: *Deleted

## 2016-09-26 NOTE — Patient Outreach (Signed)
[place] [facility]) Care Management  09/26/2016  Michelle Lopez 1945/05/03 [id number]  Received incoming return call from Ms. Glendell Docker daughter, [name] today.  HIPAA identifiers verified.  Daughter states patient received delivery of medications for patient last week and patient has been doing well with the bubble-pack.  Daughter feels very satisfied with bubble-pack and delivery as well.  She feels comfortable following up with [facility] for refills or new medications.  Daughter has no new concerns or issues related to medications.    Plan: [place] will close case for now.  I am happy to assist in the future as needed.  I will route note to provider.   I will alert [facility] RN and SW of pharmacy case closure.   Ralene Bathe, PharmD, [place] [phone]

## 2016-09-26 NOTE — Patient Outreach (Signed)
[place] [facility]) Care Management  09/26/2016  Michelle Lopez 11/29/1945 [id number]   CSW made a third and final attempt to try and contact patient's daughter, Michelle Lopez today to follow-up regarding social work services and resources for patient, without success.  A HIPAA compliant message was left for Michelle Lopez on voicemail.  CSW  continues to await a return call.  CSW will mail an outreach letter to patient's home, encouraging Michelle Lopez to contact CSW at their earliest convenience, if she and patient are interested in continuing to receive social work services through [place] with [facility].  If CSW does not receive a return call from Michelle Lopez within the next 10 business days, CSW will proceed with case closure.  Required number of phone attempts will have been made and outreach letter mailed.  Michelle Lopez, BSW, MSW, LCSW  Licensed Education officer, environmental Health System  Mailing [place] [address], [name], [place] [address] Physical Address-[address], [place], [place] [address] Toll Free Main # [phone] Fax # [phone] Cell # [phone]  Office # [phone] [email or web address]

## 2016-09-27 ENCOUNTER — Ambulatory Visit: Payer: Self-pay | Admitting: Pharmacist

## 2016-09-28 ENCOUNTER — Other Ambulatory Visit: Payer: Self-pay | Admitting: *Deleted

## 2016-09-28 NOTE — Patient Outreach (Signed)
[place] [facility]) Care Management  09/28/2016  Michelle Lopez Jul 22, 1945 [id number]   Call placed to daughter, [name], to follow up on member's current health status and to schedule home visit for next month, no answer.  HIPAA compliant voice message left.  Will await call back, if no call back, will follow up within 2 weeks.  Valente David, [place], MSN [place] [phone]

## 2016-10-10 ENCOUNTER — Other Ambulatory Visit: Payer: Self-pay | Admitting: *Deleted

## 2016-10-10 NOTE — Patient Outreach (Signed)
[place] [facility]) Care Management  10/10/2016  Michelle Lopez 19-Dec-1945 [id number]   CSW will perform a case closure on patient, due to inability to maintain phone contact with patient, despite required number of phone attempts made and outreach letter mailed to patient's home.  CSW will notify patient's RNCM with [place] Management, Valente David of CSW's plans to close patient's case.  CSW will fax an update to patient's Primary Care Physician, Dr. Scarlette Calico to ensure that they are aware of CSW's involvement with patient's plan of care.  CSW will submit a case closure request to Verlon Setting, Care Management Assistant with [place] Management, in the form of an [employer].  CSW will ensure that [name] is aware of Mrs. Lane's, RNCM with [place] Management, continued involvement with patient's care. Nat Christen, BSW, MSW, LCSW  Licensed Education officer, environmental Health System  Mailing [place] [address] [place], [place] [address] Physical Address-[address], [place], [place] [address] Toll Free Main # [phone] Fax # [phone] Cell # [phone]  Office # [phone] [email or web address]

## 2016-10-10 NOTE — Patient Outreach (Signed)
[place] [facility]) Care Management  10/10/2016  Michelle Lopez Nov 10, 1945 [id number]   Call placed to member's daughter, Michelle Lopez, to follow up on member's current condition and to schedule home visit for this month as she is member's contact person.  No answer, HIPAA compliant voice message left.  Will await call back, if no call back, will follow up within the next 2 weeks.  Valente David, [place], MSN [place] [phone]

## 2016-10-11 ENCOUNTER — Ambulatory Visit: Payer: Self-pay | Admitting: *Deleted

## 2016-10-23 ENCOUNTER — Other Ambulatory Visit: Payer: Self-pay | Admitting: *Deleted

## 2016-10-23 ENCOUNTER — Encounter: Payer: Self-pay | Admitting: *Deleted

## 2016-10-23 NOTE — Patient Outreach (Signed)
[place] [facility]) Care Management  10/23/2016  Hikari Tripp [birth year] [id number]   Call placed to member/daughter [name]) to follow up on current health status, no answer.  HIPAA compliant voice message left.  Will await call back.  This 3rd consecutive unsuccessful outreach.  Will send outreach letter, if no response in 10 business days, will close case.  Valente David, [place], MSN [place] [phone]

## 2016-11-06 ENCOUNTER — Other Ambulatory Visit: Payer: Self-pay | Admitting: *Deleted

## 2016-11-06 ENCOUNTER — Encounter: Payer: Self-pay | Admitting: *Deleted

## 2016-11-06 NOTE — Patient Outreach (Signed)
[place] [facility]) Care Management  11/06/2016  Keyoni Lapinski [birth year] [id number]   Unsuccessful outreach letter sent on 9/17, no response.  Will close case.  Will send case closure letter to member and primary MD.  Will notify care management assistant of case closure.  Valente David, [place], MSN [place] [phone]

## 2016-11-27 ENCOUNTER — Ambulatory Visit (INDEPENDENT_AMBULATORY_CARE_PROVIDER_SITE_OTHER): Payer: Medicare Other | Admitting: Internal Medicine

## 2016-11-27 ENCOUNTER — Other Ambulatory Visit (INDEPENDENT_AMBULATORY_CARE_PROVIDER_SITE_OTHER): Payer: Medicare Other

## 2016-11-27 ENCOUNTER — Encounter: Payer: Self-pay | Admitting: Internal Medicine

## 2016-11-27 VITALS — BP 160/78 | HR 83 | Temp 97.9°F | Resp 16 | Ht 60.0 in | Wt 129.5 lb

## 2016-11-27 DIAGNOSIS — F5101 Primary insomnia: Secondary | ICD-10-CM

## 2016-11-27 DIAGNOSIS — I10 Essential (primary) hypertension: Secondary | ICD-10-CM | POA: Diagnosis not present

## 2016-11-27 DIAGNOSIS — I6502 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: Secondary | ICD-10-CM

## 2016-11-27 DIAGNOSIS — H43393 Other vitreous opacities, bilateral: Secondary | ICD-10-CM

## 2016-11-27 DIAGNOSIS — Z23 Encounter for immunization: Secondary | ICD-10-CM

## 2016-11-27 DIAGNOSIS — E1121 Type 2 diabetes mellitus with diabetic nephropathy: Secondary | ICD-10-CM

## 2016-11-27 LAB — HEMOGLOBIN A1C: HEMOGLOBIN A1C: 7.8 % — AB (ref 4.6–6.5)

## 2016-11-27 LAB — BASIC METABOLIC PANEL
BUN: 15 mg/dL (ref 6–23)
CHLORIDE: 100 meq/L (ref 96–112)
CO2: 30 mEq/L (ref 19–32)
Calcium: 9.5 mg/dL (ref 8.4–10.5)
Creatinine, Ser: 0.94 mg/dL (ref 0.40–1.20)
GFR: 74.9 mL/min (ref >60.00)
GLUCOSE: 194 mg/dL — AB (ref 70–99)
POTASSIUM: 3.8 meq/L (ref 3.5–5.1)
Sodium: 140 mEq/L (ref 135–145)

## 2016-11-27 MED ORDER — SUVOREXANT 10 MG PO TABS: 1.0000 | ORAL_TABLET | Freq: Every evening | ORAL | 3 refills | Status: DC | PRN

## 2016-11-27 MED ORDER — METOPROLOL TARTRATE 25 MG PO TABS: 12.5000 mg | ORAL_TABLET | Freq: Two times a day (BID) | ORAL | 1 refills | Status: DC

## 2016-11-27 NOTE — Progress Notes (Signed)
Subjective:  Patient ID: Michelle Lopez, female    DOB: 1945-06-07  Age: 71 y.o. MRN: [id number]  CC: Hypertension and Diabetes   HPI Michelle Lopez presents for f/up -her daughter does not think she has been compliant with her medications.  She ran out of Lopressor recently and has not been taking it.  She complains of persistent insomnia and says Silenor has not helped.  She also complains of floaters over the last few weeks.  She saw her eye doctor about 4 weeks ago but at that time did not have these complaints.  She offers no other complaints today.  Outpatient Medications Prior to Visit  Medication Sig Dispense Refill  . amLODipine (NORVASC) 5 MG tablet Take 1 tablet (5 mg total) by mouth daily. 90 tablet 3  . aspirin 81 MG tablet Take 81 mg by mouth daily.      Marland Kitchen atorvastatin (LIPITOR) 20 MG tablet TAKE 1 TABLET BY MOUTH ONCE DAILY. 90 tablet 1  . Blood Glucose Monitoring Suppl (ONE TOUCH BASIC SYSTEM) W/DEVICE KIT Test up to TID dx: 250.42 1 each 1  . cetirizine (ZYRTEC) 10 MG tablet TAKE 1 TABLET BY MOUTH ONCE DAILY 30 tablet 11  . clopidogrel (PLAVIX) 75 MG tablet Take 1 tablet (75 mg total) by mouth daily. 90 tablet 1  . furosemide (LASIX) 20 MG tablet TAKE 1 TABLET BY MOUTH EVERY DAY 30 tablet 9  . glucose blood (ONETOUCH VERIO) test strip Use to check blood sugars twice a day 300 each 1  . Lancets (ONETOUCH ULTRASOFT) lancets Use to check blood sugars twice a day 300 each 1  . losartan (COZAAR) 100 MG tablet TAKE 1 TABLET BY MOUTH ONCE DAILY 90 tablet 1  . metFORMIN (GLUCOPHAGE) 1000 MG tablet TAKE 1 TABLET BY MOUTH TWICE DAILY WITH A MEAL 180 tablet 1  . tamsulosin (FLOMAX) 0.4 MG CAPS capsule TAKE 1 CAPSULE BY MOUTH EVERY DAY 90 capsule 1  . Vitamin D, Ergocalciferol, (DRISDOL) 50000 units CAPS capsule TAKE 1 CAPSULE BY MOUTH EVERY 7 DAYS 12 capsule 3  . metoprolol tartrate (LOPRESSOR) 25 MG tablet TAKE 1/2 TABLET BY MOUTH TWICE DAILY 180 tablet 1  . SILENOR 6 MG TABS TAKE 1  TABLET BY MOUTH EVERY NIGHT AT BEDTIME AS NEEDED 90 tablet 3   No facility-administered medications prior to visit.     ROS Review of Systems  Constitutional: Negative.  Negative for chills, diaphoresis and fatigue.  HENT: Negative.  Negative for trouble swallowing.   Eyes: Positive for visual disturbance. Negative for photophobia, pain and redness.  Respiratory: Negative.  Negative for cough, chest tightness, shortness of breath and wheezing.   Cardiovascular: Negative for chest pain, palpitations and leg swelling.  Gastrointestinal: Negative for abdominal pain, constipation, diarrhea, nausea and vomiting.  Endocrine: Negative.  Negative for cold intolerance and heat intolerance.  Genitourinary: Negative.  Negative for decreased urine volume, difficulty urinating, frequency and urgency.  Musculoskeletal: Negative.  Negative for back pain and myalgias.  Skin: Negative.   Allergic/Immunologic: Negative.   Neurological: Negative.  Negative for dizziness, weakness, light-headedness and headaches.  Hematological: Negative for adenopathy. Does not bruise/bleed easily.  Psychiatric/Behavioral: Positive for sleep disturbance. Negative for behavioral problems, decreased concentration, dysphoric mood and suicidal ideas. The patient is not nervous/anxious.     Objective:  BP (!) 160/78 (BP Location: Left Arm, Patient Position: Sitting, Cuff Size: Normal)   Pulse 83   Temp 97.9 F (36.6 C) (Oral)   Resp 16   Ht  5' (1.524 m)   Wt 129 lb 8 oz (58.7 kg)   SpO2 94%   BMI 25.29 kg/m   BP Readings from Last 3 Encounters:  11/27/16 (!) 160/78  09/14/16 (!) 174/98  09/11/16 (!) 160/79    Wt Readings from Last 3 Encounters:  11/27/16 129 lb 8 oz (58.7 kg)  09/14/16 127 lb (57.6 kg)  08/15/16 125 lb (56.7 kg)    Physical Exam  Constitutional: She is oriented to person, place, and time. No distress.  HENT:  Mouth/Throat: Oropharynx is clear and moist. No oropharyngeal exudate.  Eyes:  Conjunctivae are normal. Right eye exhibits no discharge. Left eye exhibits no discharge. Right conjunctiva is not injected. Left conjunctiva is not injected. No scleral icterus.  Neck: Normal range of motion. Neck supple. No JVD present. No thyromegaly present.  Cardiovascular: Normal rate, regular rhythm and intact distal pulses.  Exam reveals no gallop and no friction rub.   No murmur heard. Pulmonary/Chest: Effort normal and breath sounds normal. No respiratory distress. She has no wheezes. She has no rales. She exhibits no tenderness.  Abdominal: Soft. Bowel sounds are normal. She exhibits no distension and no mass. There is no tenderness. There is no rebound and no guarding.  Musculoskeletal: Normal range of motion. She exhibits no edema, tenderness or deformity.  Lymphadenopathy:    She has no cervical adenopathy.  Neurological: She is alert and oriented to person, place, and time.  Skin: Skin is warm and dry. No rash noted. She is not diaphoretic. No erythema. No pallor.  Psychiatric: She has a normal mood and affect. Her behavior is normal. Judgment and thought content normal.  Vitals reviewed.   Lab Results  Component Value Date   WBC 6.7 07/24/2016   HGB 14.2 07/24/2016   HCT 45.4 07/24/2016   PLT 266.0 07/24/2016   GLUCOSE 194 (H) 11/27/2016   CHOL 177 07/24/2016   TRIG 109.0 07/24/2016   HDL 43.90 07/24/2016   LDLDIRECT 100.0 07/06/2015   LDLCALC 111 (H) 07/24/2016   ALT 6 07/24/2016   AST 11 07/24/2016   NA 140 11/27/2016   K 3.8 11/27/2016   CL 100 11/27/2016   CREATININE 0.94 11/27/2016   BUN 15 11/27/2016   CO2 30 11/27/2016   TSH 2.25 07/24/2016   INR 1.1 (H) 02/25/2015   HGBA1C 7.8 (H) 11/27/2016   MICROALBUR 0.7 07/24/2016    No results found.  Assessment & Plan:   Michelle Lopez was seen today for hypertension and diabetes.  Diagnoses and all orders for this visit:  Essential hypertension, benign-her blood pressures not well controlled.  Will restart  metoprolol.  I have also asked her to be compliant with her other meds as well. -     metoprolol tartrate (LOPRESSOR) 25 MG tablet; Take 0.5 tablets (12.5 mg total) by mouth 2 (two) times daily. -     Basic metabolic panel; Future  Type 2 diabetes mellitus with diabetic nephropathy, without long-term current use of insulin (HCC)-her A1c is up to 7.8%.  I have asked her to be more compliant with her diabetic regimen. -     Basic metabolic panel; Future -     Hemoglobin A1c; Future  Vitreous floaters of both eyes-I do not see any acute ocular issue at this time.  I have asked her to follow-up with her ophthalmologist. -     Ambulatory referral to Ophthalmology  Primary insomnia -     Suvorexant (BELSOMRA) 10 MG TABS; Take 1 tablet by  mouth at bedtime as needed.  Need for influenza vaccination -     Flu vaccine HIGH DOSE PF (Fluzone High dose)   I have discontinued Michelle Lopez's SILENOR. I have also changed her metoprolol tartrate. Additionally, I am having her start on Suvorexant. Lastly, I am having her maintain her aspirin, ONE TOUCH BASIC SYSTEM, tamsulosin, cetirizine, furosemide, Vitamin D (Ergocalciferol), clopidogrel, losartan, metFORMIN, atorvastatin, onetouch ultrasoft, glucose blood, and amLODipine.  Meds ordered this encounter  Medications  . metoprolol tartrate (LOPRESSOR) 25 MG tablet    Sig: Take 0.5 tablets (12.5 mg total) by mouth 2 (two) times daily.    Dispense:  180 tablet    Refill:  1  . Suvorexant (BELSOMRA) 10 MG TABS    Sig: Take 1 tablet by mouth at bedtime as needed.    Dispense:  30 tablet    Refill:  3     Follow-up: Return in about 6 months (around 05/28/2017).  Scarlette Calico, MD

## 2016-11-27 NOTE — Patient Instructions (Signed)

## 2017-01-09 ENCOUNTER — Other Ambulatory Visit: Payer: Self-pay | Admitting: Internal Medicine

## 2017-01-25 ENCOUNTER — Other Ambulatory Visit: Payer: Self-pay | Admitting: Internal Medicine

## 2017-03-12 ENCOUNTER — Other Ambulatory Visit: Payer: Self-pay

## 2017-03-12 ENCOUNTER — Telehealth: Payer: Self-pay | Admitting: Internal Medicine

## 2017-03-12 MED ORDER — TAMSULOSIN HCL 0.4 MG PO CAPS: 0.4000 mg | ORAL_CAPSULE | Freq: Every day | ORAL | 1 refills | Status: DC

## 2017-03-12 NOTE — Telephone Encounter (Signed)
Copied from [place]. Topic: Quick Communication - Rx Refill/Question >> Mar 12, 2017  2:44 PM Clack, Laban Emperor wrote: Medication: tamsulosin (FLOMAX) 0.4 MG CAPS capsule [[id number]]    Has the patient contacted their pharmacy? Yes.     (Agent: If no, request that the patient contact the pharmacy for the refill.)   Preferred Pharmacy (with phone number or street name): [facility]- [place], [place] - [address] [phone] (Phone) [phone] (Fax)     Agent: Please be advised that RX refills may take up to 3 business days. We ask that you follow-up with your pharmacy.

## 2017-04-02 ENCOUNTER — Ambulatory Visit: Payer: Medicare Other | Admitting: Internal Medicine

## 2017-04-03 ENCOUNTER — Encounter: Payer: Self-pay | Admitting: Internal Medicine

## 2017-04-03 ENCOUNTER — Ambulatory Visit (INDEPENDENT_AMBULATORY_CARE_PROVIDER_SITE_OTHER): Payer: Medicare Other | Admitting: Internal Medicine

## 2017-04-03 ENCOUNTER — Ambulatory Visit (INDEPENDENT_AMBULATORY_CARE_PROVIDER_SITE_OTHER)
Admission: RE | Admit: 2017-04-03 | Discharge: 2017-04-03 | Disposition: A | Discharge status: Home / Self Care | Payer: Medicare Other | Source: Ambulatory Visit | Attending: Internal Medicine | Admitting: Internal Medicine

## 2017-04-03 VITALS — BP 138/80 | HR 64 | Temp 97.7°F | Ht 60.0 in | Wt 131.0 lb

## 2017-04-03 DIAGNOSIS — M25552 Pain in left hip: Secondary | ICD-10-CM | POA: Diagnosis not present

## 2017-04-03 DIAGNOSIS — M25551 Pain in right hip: Secondary | ICD-10-CM

## 2017-04-03 DIAGNOSIS — M1611 Unilateral primary osteoarthritis, right hip: Secondary | ICD-10-CM

## 2017-04-03 NOTE — Patient Instructions (Signed)

## 2017-04-04 NOTE — Progress Notes (Signed)
Subjective:  Patient ID: Michelle Lopez, female    DOB: 1945/06/30  Age: 72 y.o. MRN: [id number]  CC: Osteoarthritis   HPI Michelle Lopez presents for a several week history of bilateral hip pain that interferes with her ability to walk and move around.  She denies any recent trauma or injury.  She had a left hip replacement many years ago.  She is controlling the pain with Tylenol.  The pain is located in the anterior aspect of both hips and does not radiate into her lower extremities.  She also denies low back pain.  Outpatient Medications Prior to Visit  Medication Sig Dispense Refill  . amLODipine (NORVASC) 5 MG tablet Take 1 tablet (5 mg total) by mouth daily. 90 tablet 3  . aspirin 81 MG tablet Take 81 mg by mouth daily.      Marland Kitchen atorvastatin (LIPITOR) 20 MG tablet TAKE 1 TABLET BY MOUTH ONCE DAILY. 90 tablet 1  . Blood Glucose Monitoring Suppl (ONE TOUCH BASIC SYSTEM) W/DEVICE KIT Test up to TID dx: 250.42 1 each 1  . cetirizine (ZYRTEC) 10 MG tablet TAKE 1 TABLET BY MOUTH ONCE DAILY 30 tablet 11  . clopidogrel (PLAVIX) 75 MG tablet Take 1 tablet (75 mg total) by mouth daily. 90 tablet 1  . furosemide (LASIX) 20 MG tablet take 1 tablet BY MOUTH EVERY DAY 90 tablet 2  . glucose blood (ONETOUCH VERIO) test strip Use to check blood sugars twice a day 300 each 1  . Lancets (ONETOUCH ULTRASOFT) lancets Use to check blood sugars twice a day 300 each 1  . losartan (COZAAR) 100 MG tablet TAKE 1 TABLET BY MOUTH ONCE DAILY 90 tablet 1  . metFORMIN (GLUCOPHAGE) 1000 MG tablet TAKE 1 TABLET BY MOUTH TWICE DAILY WITH A MEAL 180 tablet 1  . metoprolol tartrate (LOPRESSOR) 25 MG tablet Take 0.5 tablets (12.5 mg total) by mouth 2 (two) times daily. 180 tablet 1  . Suvorexant (BELSOMRA) 10 MG TABS Take 1 tablet by mouth at bedtime as needed. 30 tablet 3  . tamsulosin (FLOMAX) 0.4 MG CAPS capsule Take 1 capsule (0.4 mg total) by mouth daily. 90 capsule 1  . Vitamin D, Ergocalciferol, (DRISDOL) 50000 units  CAPS capsule TAKE 1 CAPSULE BY MOUTH EVERY 7 DAYS 12 capsule 3  . ZYRTEC ALLERGY 10 MG tablet take 1 TABLET BY MOUTH EVERY DAY  4  . glipiZIDE (GLUCOTROL) 5 MG tablet Take 5 mg by mouth 2 (two) times daily before a meal.     No facility-administered medications prior to visit.     ROS Review of Systems  Constitutional: Negative.  Negative for chills, fatigue and fever.  HENT: Negative.  Negative for trouble swallowing.   Eyes: Negative for visual disturbance.  Respiratory: Negative for cough, chest tightness, shortness of breath and wheezing.   Cardiovascular: Negative for chest pain, palpitations and leg swelling.  Gastrointestinal: Negative for abdominal pain, constipation, diarrhea, nausea and vomiting.  Endocrine: Negative.   Genitourinary: Negative.  Negative for difficulty urinating.  Musculoskeletal: Positive for arthralgias. Negative for back pain.  Skin: Negative.  Negative for color change and rash.  Allergic/Immunologic: Negative.   Neurological: Negative.  Negative for dizziness, weakness, light-headedness and numbness.  Hematological: Negative for adenopathy. Does not bruise/bleed easily.  Psychiatric/Behavioral: Negative.     Objective:  BP 138/80 (BP Location: Right Arm, Patient Position: Sitting, Cuff Size: Normal)   Pulse 64   Temp 97.7 F (36.5 C) (Oral)   Ht 5' (1.524 m)  Wt 131 lb (59.4 kg)   SpO2 98%   BMI 25.58 kg/m   BP Readings from Last 3 Encounters:  04/03/17 138/80  11/27/16 (!) 160/78  09/14/16 (!) 174/98    Wt Readings from Last 3 Encounters:  04/03/17 131 lb (59.4 kg)  11/27/16 129 lb 8 oz (58.7 kg)  09/14/16 127 lb (57.6 kg)    Physical Exam  Constitutional: She is oriented to person, place, and time. No distress.  HENT:  Mouth/Throat: Oropharynx is clear and moist. No oropharyngeal exudate.  Eyes: Conjunctivae are normal. Left eye exhibits no discharge. No scleral icterus.  Neck: Normal range of motion. Neck supple. No JVD present.  No thyromegaly present.  Cardiovascular: Normal rate, regular rhythm and normal heart sounds. Exam reveals no gallop.  No murmur heard. Pulmonary/Chest: Effort normal and breath sounds normal. No respiratory distress. She has no wheezes. She has no rales.  Abdominal: Soft. Bowel sounds are normal. She exhibits no distension and no mass. There is no tenderness. There is no guarding.  Musculoskeletal: Normal range of motion. She exhibits no edema, tenderness or deformity.       Right hip: Normal. She exhibits normal range of motion, normal strength, no tenderness, no bony tenderness, no swelling and no deformity.       Left hip: Normal. She exhibits normal range of motion, normal strength, no tenderness, no bony tenderness, no swelling and no deformity.  Lymphadenopathy:    She has no cervical adenopathy.  Neurological: She is alert and oriented to person, place, and time.  Skin: Skin is warm and dry. No rash noted. She is not diaphoretic. No erythema. No pallor.  Vitals reviewed.   Lab Results  Component Value Date   WBC 6.7 07/24/2016   HGB 14.2 07/24/2016   HCT 45.4 07/24/2016   PLT 266.0 07/24/2016   GLUCOSE 194 (H) 11/27/2016   CHOL 177 07/24/2016   TRIG 109.0 07/24/2016   HDL 43.90 07/24/2016   LDLDIRECT 100.0 07/06/2015   LDLCALC 111 (H) 07/24/2016   ALT 6 07/24/2016   AST 11 07/24/2016   NA 140 11/27/2016   K 3.8 11/27/2016   CL 100 11/27/2016   CREATININE 0.94 11/27/2016   BUN 15 11/27/2016   CO2 30 11/27/2016   TSH 2.25 07/24/2016   INR 1.1 (H) 02/25/2015   HGBA1C 7.8 (H) 11/27/2016   MICROALBUR 0.7 07/24/2016    Dg Hips Bilat With Pelvis Min 5 Views  Result Date: 04/03/2017 CLINICAL DATA:  Bilateral hip pain with walking for the last week, no known injury EXAM: DG HIP (WITH OR WITHOUT PELVIS) 5+V BILAT COMPARISON:  None. FINDINGS: Left total hip replacement is noted. The acetabular and femoral components appear to be in good position with no complicating features.  The femoral stem appears well positioned with no evidence of fracture. There are moderate D degenerative joint disease changes involving the right hip with loss of joint space and sclerosis with spurring and subchondral cyst formation. However no acute fracture is seen. The pelvic rami are intact. The SI joints appear corticated. IMPRESSION: 1. 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 2. 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 No acute abnormality. Electronically Signed   By: Ivar Drape M.D.   On: 04/03/2017 16:45    Assessment & Plan:   Amyrah was seen today for osteoarthritis.  Diagnoses and all orders for this visit:  Hip pain, bilateral- see below -     DG  HIPS BILAT WITH PELVIS MIN 5 VIEWS; Future -     Ambulatory referral to Orthopedic Surgery  Primary osteoarthritis of right hip- The x-ray shows significant degenerative disease in the right hip including loss of joint space, sclerosis with spurring, and a subchondral cyst.  I think she may benefit from right total hip replacement so I have referred her to orthopedics. -     Ambulatory referral to Orthopedic Surgery   I am having Michelle Lopez maintain her aspirin, ONE TOUCH BASIC SYSTEM, cetirizine, Vitamin D (Ergocalciferol), clopidogrel, losartan, metFORMIN, atorvastatin, onetouch ultrasoft, glucose blood, amLODipine, metoprolol tartrate, Suvorexant, furosemide, tamsulosin, and ZYRTEC ALLERGY.  No orders of the defined types were placed in this encounter.    Follow-up: Return in about 4 weeks (around 05/01/2017).  Scarlette Calico, MD

## 2017-04-12 DIAGNOSIS — M25551 Pain in right hip: Secondary | ICD-10-CM | POA: Diagnosis not present

## 2017-04-30 ENCOUNTER — Other Ambulatory Visit: Payer: Self-pay | Admitting: Internal Medicine

## 2017-04-30 DIAGNOSIS — M8580 Other specified disorders of bone density and structure, unspecified site: Secondary | ICD-10-CM

## 2017-04-30 DIAGNOSIS — E559 Vitamin D deficiency, unspecified: Secondary | ICD-10-CM

## 2017-04-30 MED ORDER — VITAMIN D (ERGOCALCIFEROL) 1.25 MG (50000 UNIT) PO CAPS
ORAL_CAPSULE | ORAL | 1 refills | Status: DC
Start: 2017-04-30 — End: 2017-09-16

## 2017-05-04 LAB — HM DIABETES EYE EXAM

## 2017-05-08 ENCOUNTER — Encounter: Payer: Self-pay | Admitting: Internal Medicine

## 2017-05-21 ENCOUNTER — Telehealth: Payer: Self-pay | Admitting: Internal Medicine

## 2017-05-21 DIAGNOSIS — I739 Peripheral vascular disease, unspecified: Secondary | ICD-10-CM

## 2017-05-21 MED ORDER — AMLODIPINE BESYLATE 5 MG PO TABS: 5.0000 mg | ORAL_TABLET | Freq: Every day | ORAL | 0 refills | Status: DC

## 2017-05-21 MED ORDER — LOSARTAN POTASSIUM 100 MG PO TABS: 100.0000 mg | ORAL_TABLET | Freq: Every day | ORAL | 0 refills | Status: DC

## 2017-05-21 MED ORDER — METFORMIN HCL 1000 MG PO TABS: ORAL_TABLET | ORAL | 0 refills | Status: DC

## 2017-05-21 MED ORDER — CLOPIDOGREL BISULFATE 75 MG PO TABS: 75.0000 mg | ORAL_TABLET | Freq: Every day | ORAL | 0 refills | Status: DC

## 2017-05-21 MED ORDER — ATORVASTATIN CALCIUM 20 MG PO TABS: 20.0000 mg | ORAL_TABLET | Freq: Every day | ORAL | 0 refills | Status: DC

## 2017-05-21 NOTE — Telephone Encounter (Signed)
Attempted to call pt's daughter, [name] on number provided [phone], but no answer at this time and was unable to leave a message due to mailbox being full. Pt will need to be scheduled for appt. 30 day refill of medication will be given until pt seen in office for appt.

## 2017-05-21 NOTE — Telephone Encounter (Signed)
Copied from [place] [id number]. Topic: Quick Communication - See Telephone Encounter >> May 21, 2017  1:04 PM Arletha Grippe wrote: CRM for notification. See Telephone encounter for: 05/21/17.  Daughter called - she is saying that [facility] sent [place] a request to refill medications, and they did not hear back.  Pt is out of several meds:she has been out for a week  Atorvastatin  Clopidigrel  Losartan  Metformin  Amlodipine   Fax number to [facility] is [phone], fax is [phone]  Cb for daughter is [phone]

## 2017-06-20 ENCOUNTER — Other Ambulatory Visit: Payer: Self-pay | Admitting: Internal Medicine

## 2017-06-20 DIAGNOSIS — E559 Vitamin D deficiency, unspecified: Secondary | ICD-10-CM

## 2017-06-20 DIAGNOSIS — I10 Essential (primary) hypertension: Secondary | ICD-10-CM

## 2017-06-20 DIAGNOSIS — I739 Peripheral vascular disease, unspecified: Secondary | ICD-10-CM

## 2017-06-20 DIAGNOSIS — M8580 Other specified disorders of bone density and structure, unspecified site: Secondary | ICD-10-CM

## 2017-06-20 MED ORDER — AMLODIPINE BESYLATE 5 MG PO TABS: 5.0000 mg | ORAL_TABLET | Freq: Every day | ORAL | 0 refills | Status: DC

## 2017-06-20 MED ORDER — METOPROLOL TARTRATE 25 MG PO TABS: 12.5000 mg | ORAL_TABLET | Freq: Two times a day (BID) | ORAL | 0 refills | Status: DC

## 2017-06-20 MED ORDER — CLOPIDOGREL BISULFATE 75 MG PO TABS: 75.0000 mg | ORAL_TABLET | Freq: Every day | ORAL | 0 refills | Status: DC

## 2017-06-20 MED ORDER — TAMSULOSIN HCL 0.4 MG PO CAPS: 0.4000 mg | ORAL_CAPSULE | Freq: Every day | ORAL | 0 refills | Status: DC

## 2017-06-20 MED ORDER — LOSARTAN POTASSIUM 100 MG PO TABS: 100.0000 mg | ORAL_TABLET | Freq: Every day | ORAL | 0 refills | Status: DC

## 2017-06-20 MED ORDER — ATORVASTATIN CALCIUM 20 MG PO TABS: 20.0000 mg | ORAL_TABLET | Freq: Every day | ORAL | 0 refills | Status: DC

## 2017-06-20 MED ORDER — METFORMIN HCL 1000 MG PO TABS: ORAL_TABLET | ORAL | 0 refills | Status: DC

## 2017-06-20 MED ORDER — FUROSEMIDE 20 MG PO TABS: 20.0000 mg | ORAL_TABLET | Freq: Every day | ORAL | 0 refills | Status: DC

## 2017-06-20 NOTE — Telephone Encounter (Signed)
Patient's old pharmacy closed and will need prescription sent to new pharmacy, a 30 day supply until her upcoming appointment on 07/10/17.  Vitamin D Last OV:11/27/16 Last refill:04/30/17 12 cap/1 refill GAC:[facility] Pharmacy: [id number] - [place], [place] [phone] (Phone) [phone] (Fax)

## 2017-06-20 NOTE — Telephone Encounter (Signed)
Copied from [place] [id number]. Topic: Quick Communication - See Telephone Encounter >> Jun 20, 2017  3:25 PM Bea Graff, NT wrote: CRM for notification. See Telephone encounter for: 06/20/17. Pts daughter calling and states that [facility] on [address] closed down and pt is needing all her medications refilled at [id number] - [place], [address] [place] [phone] (Phone) [phone] (Fax) Medications she needs are as follows:   amLODipine (NORVASC) 5 MG tablet atorvastatin (LIPITOR) 20 MG tablet  clopidogrel (PLAVIX) 75 MG tablet  losartan (COZAAR) 100 MG tablet metFORMIN (GLUCOPHAGE) 1000 MG tablet  furosemide (LASIX) 20 MG tablet metoprolol tartrate (LOPRESSOR) 25 MG tablet  tamsulosin (FLOMAX) 0.4 MG CAPS capsule Vitamin D, Ergocalciferol, (DRISDOL) 50000 units CAPS capsule

## 2017-07-10 ENCOUNTER — Ambulatory Visit: Payer: Medicare Other

## 2017-07-10 NOTE — Progress Notes (Deleted)
Subjective:   Michelle Lopez is a 72 y.o. female who presents for Medicare Annual (Subsequent) preventive examination.  Review of Systems:  No ROS.  Medicare Wellness Visit. Additional risk factors are reflected in the social history.    Sleep patterns: {SX; SLEEP PATTERNS:18802::"feels rested on waking","does not get up to void","gets up *** times nightly to void","sleeps *** hours nightly"}.    Home Safety/Smoke Alarms: Feels safe in home. Smoke alarms in place.  Living environment; residence and Firearm Safety: {Rehab home environment / accessibility:30080::"no firearms","firearms stored safely"}. Seat Belt Safety/Bike Helmet: Wears seat belt.     Objective:     Vitals: There were no vitals taken for this visit.  There is no height or weight on file to calculate BMI.  Advanced Directives 08/22/2016 08/15/2016 07/07/2015 02/17/2015 03/19/2013 04/20/2011  Does Patient Have a Medical Advance Directive? Yes Yes Yes No Patient does not have advance directive;Patient would not like information Patient does not have advance directive;Patient would not like information  Type of Scientist, forensic Power of [place];Living will - - -  Does patient want to make changes to medical advance directive? No - Patient declined No - Patient declined No - Patient declined - - -  Copy of [place] in Chart? Yes - Yes - - -  Would patient like information on creating a medical advance directive? - - - Yes - Educational materials given - -  Pre-existing out of facility DNR order (yellow form or pink MOST form) - - - - No No    Tobacco Social History   Tobacco Use  Smoking Status Never Smoker  Smokeless Tobacco Never Used     Counseling given: Not Answered  Past Medical History:  Diagnosis Date  . Cerebrovascular disease, unspecified   . Cocaine abuse, unspecified   . Diabetes mellitus    type 2, uncontrolled    . Diarrhea   . Essential hypertension, benign   . Hyperlipidemia   . Stroke [facility])    x7   Past Surgical History:  Procedure Laterality Date  . TOTAL HIP ARTHROPLASTY  1970   Dr. Rodell Perna   Family History  Problem Relation Age of Onset  . Colon cancer Neg Hx    Social History   Socioeconomic History  . Marital status: Divorced    Spouse name: Not on file  . Number of children: 4  . Years of education: Not on file  . Highest education level: Not on file  Occupational History  . Occupation: Disability  Social Needs  . Financial resource strain: Not on file  . Food insecurity:    Worry: Not on file    Inability: Not on file  . Transportation needs:    Medical: Not on file    Non-medical: Not on file  Tobacco Use  . Smoking status: Never Smoker  . Smokeless tobacco: Never Used  Substance and Sexual Activity  . Alcohol use: No    Alcohol/week: 0.0 oz  . Drug use: No  . Sexual activity: Not Currently  Lifestyle  . Physical activity:    Days per week: Not on file    Minutes per session: Not on file  . Stress: Not on file  Relationships  . Social connections:    Talks on phone: Not on file    Gets together: Not on file    Attends religious service: Not on file    Active member of club  or organization: Not on file    Attends meetings of clubs or organizations: Not on file    Relationship status: Not on file  Other Topics Concern  . Not on file  Social History Narrative   Daily caffeine     Outpatient Encounter Medications as of 07/10/2017  Medication Sig  . amLODipine (NORVASC) 5 MG tablet Take 1 tablet (5 mg total) by mouth daily.  Marland Kitchen aspirin 81 MG tablet Take 81 mg by mouth daily.    Marland Kitchen atorvastatin (LIPITOR) 20 MG tablet Take 1 tablet (20 mg total) by mouth daily.  . Blood Glucose Monitoring Suppl ([place]) W/DEVICE KIT Test up to TID dx: 250.42  . cetirizine (ZYRTEC) 10 MG tablet TAKE 1 TABLET BY MOUTH ONCE DAILY  . clopidogrel (PLAVIX) 75  MG tablet Take 1 tablet (75 mg total) by mouth daily.  . furosemide (LASIX) 20 MG tablet Take 1 tablet (20 mg total) by mouth daily.  Marland Kitchen glucose blood (ONETOUCH VERIO) test strip Use to check blood sugars twice a day  . Lancets (ONETOUCH ULTRASOFT) lancets Use to check blood sugars twice a day  . losartan (COZAAR) 100 MG tablet Take 1 tablet (100 mg total) by mouth daily.  . metFORMIN (GLUCOPHAGE) 1000 MG tablet TAKE 1 TABLET BY MOUTH TWICE DAILY WITH A MEAL  . metoprolol tartrate (LOPRESSOR) 25 MG tablet Take 0.5 tablets (12.5 mg total) by mouth 2 (two) times daily.  . Suvorexant (BELSOMRA) 10 MG TABS Take 1 tablet by mouth at bedtime as needed.  . tamsulosin (FLOMAX) 0.4 MG CAPS capsule Take 1 capsule (0.4 mg total) by mouth daily.  . Vitamin D, Ergocalciferol, (DRISDOL) 50000 units CAPS capsule TAKE 1 CAPSULE BY MOUTH EVERY 7 DAYS  . ZYRTEC ALLERGY 10 MG tablet take 1 TABLET BY MOUTH EVERY DAY   No facility-administered encounter medications on file as of 07/10/2017.     Activities of Daily Living In your present state of health, do you have any difficulty performing the following activities: 08/22/2016 08/15/2016  Hearing? N N  Vision? Y Y  Difficulty concentrating or making decisions? [name]  Walking or climbing stairs? Y Y  Dressing or bathing? Y N  Doing errands, shopping? [name]  Preparing Food and eating ? Y Y  Using the Toilet? N N  In the past six months, have you accidently leaked urine? Y N  Do you have problems with loss of bowel control? N N  Managing your Medications? Y Y  Managing your Finances? [name]  Housekeeping or managing your Housekeeping? [name]  Some recent data might be hidden    Patient Care Team: Janith Lima, MD as PCP - General    Assessment:   This is a routine wellness examination for Michelle Lopez.  Exercise Activities and Dietary recommendations    Goals    None      Fall Risk Fall Risk  08/22/2016 08/15/2016 07/07/2015 12/14/2014 07/23/2013  Falls in the past  year? Yes Yes No No Yes  Number falls in past yr: 2 or more 2 or more - - 1  Injury with Fall? No No - - No  Risk Factor Category  High Fall Risk High Fall Risk - - -  Risk for fall due to : History of fall(s);Impaired balance/gait;Impaired mobility;Mental status change History of fall(s);Impaired balance/gait - - History of fall(s);Impaired balance/gait  Follow up Education provided;Falls prevention discussed Falls prevention discussed - - -    Depression Screen  PHQ 2/9 Scores 08/22/2016 08/15/2016 07/07/2015 12/14/2014  PHQ - 2 Score 1 1 0 0  Exception Documentation Medical reason - - -     Cognitive Function        Immunization History  Administered Date(s) Administered  . Influenza Whole 01/14/2009, 11/16/2009  . Influenza, High Dose Seasonal PF 12/14/2014, 11/27/2016  . Influenza,inj,Quad PF,6+ Mos 10/31/2013  . Influenza-Unspecified 12/07/2012  . Pneumococcal Conjugate-13 06/23/2014  . Pneumococcal Polysaccharide-23 12/24/2008  . Td 11/16/2009  . Zoster 10/03/2012   Screening Tests Health Maintenance  Topic Date Due  . Hepatitis C Screening  03-06-1945  . COLONOSCOPY  02/10/1993  . MAMMOGRAM  03/13/2016  . HEMOGLOBIN A1C  05/28/2017  . FOOT EXAM  07/24/2017  . INFLUENZA VACCINE  09/06/2017  . OPHTHALMOLOGY EXAM  05/05/2018  . TETANUS/TDAP  11/17/2019  . DEXA SCAN  Completed  . PNA vac Low Risk Adult  Completed      Plan:     I have personally reviewed and noted the following in the patient's chart:   . Medical and social history . Use of alcohol, tobacco or illicit drugs  . Current medications and supplements . Functional ability and status . Nutritional status . Physical activity . Advanced directives . List of other physicians . Vitals . Screenings to include cognitive, depression, and falls . Referrals and appointments  In addition, I have reviewed and discussed with patient certain preventive protocols, quality metrics, and best practice  recommendations. A written personalized care plan for preventive services as well as general preventive health recommendations were provided to patient.     Michiel Cowboy, RN  07/10/2017

## 2017-07-23 ENCOUNTER — Telehealth: Payer: Self-pay | Admitting: Internal Medicine

## 2017-07-23 DIAGNOSIS — I739 Peripheral vascular disease, unspecified: Secondary | ICD-10-CM

## 2017-07-23 DIAGNOSIS — I10 Essential (primary) hypertension: Secondary | ICD-10-CM

## 2017-07-24 ENCOUNTER — Other Ambulatory Visit: Payer: Self-pay | Admitting: Internal Medicine

## 2017-07-24 DIAGNOSIS — I10 Essential (primary) hypertension: Secondary | ICD-10-CM

## 2017-07-25 ENCOUNTER — Other Ambulatory Visit: Payer: Self-pay | Admitting: Internal Medicine

## 2017-07-25 ENCOUNTER — Telehealth: Payer: Self-pay | Admitting: Internal Medicine

## 2017-07-25 MED ORDER — CLOPIDOGREL BISULFATE 75 MG PO TABS: 75.0000 mg | ORAL_TABLET | Freq: Every day | ORAL | 0 refills | Status: DC

## 2017-07-25 MED ORDER — METFORMIN HCL 1000 MG PO TABS: ORAL_TABLET | ORAL | 0 refills | Status: DC

## 2017-07-25 MED ORDER — LOSARTAN POTASSIUM 100 MG PO TABS: 100.0000 mg | ORAL_TABLET | Freq: Every day | ORAL | 0 refills | Status: DC

## 2017-07-25 MED ORDER — METOPROLOL TARTRATE 25 MG PO TABS
12.5000 mg | ORAL_TABLET | Freq: Two times a day (BID) | ORAL | 0 refills | Status: DC
Start: 2017-07-25 — End: 2017-10-01

## 2017-07-25 MED ORDER — AMLODIPINE BESYLATE 5 MG PO TABS: 5.0000 mg | ORAL_TABLET | Freq: Every day | ORAL | 0 refills | Status: DC

## 2017-07-25 NOTE — Telephone Encounter (Signed)
Pt calling about medications that were previously refused. Pt scheduled appt on 08/14/17 and wants to know if she can be given a refill on the medications until she is seen in the office for scheduled appt.      Refused Medication Requests      Losartan Potassium 100 MG TAKE 1 TABLET BY MOUTH EVERY DAY      Clopidogrel Bisulfate 75 MG TAKE 1 TABLET BY MOUTH EVERY DAY      Atorvastatin Calcium 20 MG TAKE 1 TABLET BY MOUTH EVERY DAY      amLODIPine Besylate 5 MG TAKE 1 TABLET BY MOUTH EVERY DAY      metFORMIN HCl 1000 MG TAKE 1 TABLET BY MOUTH TWICE DAILY WITH A MEAL

## 2017-07-25 NOTE — Telephone Encounter (Signed)
Patient has been informed.

## 2017-07-25 NOTE — Telephone Encounter (Signed)
Copied from [place] [id number]. Topic: General - Other >> Jul 25, 2017 12:31 PM Oneta Rack wrote: Relation to pt: self  Call back [place]:   [id number] [place], [place] [phone] (Phone) [phone] (Fax)  Reason for call:  Pharmacy requesting amLODipine (NORVASC) 5 MG tablet, losartan (COZAAR) 100 MG tablet , clopidogrel (PLAVIX) 75 MG tablet ,  atorvastatin (LIPITOR) 20 MG tablet , metFORMIN (GLUCOPHAGE) 1000 MG tablet =, informed pharmacy please allow 48 to 72 hour turn around time, please advise

## 2017-07-25 NOTE — Telephone Encounter (Signed)
MD has already sent 30 day supply to [facility].Marland KitchenJohny Chess

## 2017-07-25 NOTE — Telephone Encounter (Unsigned)
Copied from [place] [id number]. Topic: Quick Communication - See Telephone Encounter >> Jul 25, 2017 12:40 PM Hewitt Shorts wrote: Pt daughter Rosaria Ferries is calling stating that there is a confusion about what meds are needing to be refilled for her mom and sent to [facility]  Meds are amlodipine , metformin, atorvastatin, losartan, and clopidogrel   Best number for Rosaria Ferries is [phone]

## 2017-07-25 NOTE — Telephone Encounter (Signed)
This has been sent in. Can you let pt or pt dtr know!

## 2017-07-25 NOTE — Addendum Note (Signed)
Addended by: Aviva Signs M on: 07/25/2017 01:20 PM   Modules accepted: Orders

## 2017-07-26 NOTE — Telephone Encounter (Signed)
Left message- per chart looks like all requested medication have been sent to pharmacy. Please call if she has any problems getting them.

## 2017-07-27 DIAGNOSIS — H402211 Chronic angle-closure glaucoma, right eye, mild stage: Secondary | ICD-10-CM | POA: Diagnosis not present

## 2017-07-27 DIAGNOSIS — H402222 Chronic angle-closure glaucoma, left eye, moderate stage: Secondary | ICD-10-CM | POA: Diagnosis not present

## 2017-07-27 DIAGNOSIS — H11233 Symblepharon, bilateral: Secondary | ICD-10-CM | POA: Diagnosis not present

## 2017-07-27 DIAGNOSIS — H04123 Dry eye syndrome of bilateral lacrimal glands: Secondary | ICD-10-CM | POA: Diagnosis not present

## 2017-08-14 ENCOUNTER — Ambulatory Visit: Payer: Medicare Other | Admitting: Internal Medicine

## 2017-08-17 ENCOUNTER — Other Ambulatory Visit: Payer: Self-pay | Admitting: Internal Medicine

## 2017-08-17 DIAGNOSIS — I739 Peripheral vascular disease, unspecified: Secondary | ICD-10-CM

## 2017-08-23 ENCOUNTER — Ambulatory Visit (INDEPENDENT_AMBULATORY_CARE_PROVIDER_SITE_OTHER): Payer: Medicare Other | Admitting: Internal Medicine

## 2017-08-23 ENCOUNTER — Telehealth: Payer: Self-pay | Admitting: Internal Medicine

## 2017-08-23 ENCOUNTER — Encounter: Payer: Self-pay | Admitting: Internal Medicine

## 2017-08-23 ENCOUNTER — Other Ambulatory Visit (INDEPENDENT_AMBULATORY_CARE_PROVIDER_SITE_OTHER): Payer: Medicare Other

## 2017-08-23 ENCOUNTER — Encounter

## 2017-08-23 VITALS — BP 158/68 | HR 69 | Temp 98.2°F | Ht 60.0 in | Wt 128.0 lb

## 2017-08-23 DIAGNOSIS — Z794 Long term (current) use of insulin: Secondary | ICD-10-CM

## 2017-08-23 DIAGNOSIS — I10 Essential (primary) hypertension: Secondary | ICD-10-CM

## 2017-08-23 DIAGNOSIS — E785 Hyperlipidemia, unspecified: Secondary | ICD-10-CM

## 2017-08-23 DIAGNOSIS — Z23 Encounter for immunization: Secondary | ICD-10-CM | POA: Diagnosis not present

## 2017-08-23 DIAGNOSIS — E559 Vitamin D deficiency, unspecified: Secondary | ICD-10-CM

## 2017-08-23 DIAGNOSIS — Z0000 Encounter for general adult medical examination without abnormal findings: Secondary | ICD-10-CM | POA: Diagnosis not present

## 2017-08-23 DIAGNOSIS — E1121 Type 2 diabetes mellitus with diabetic nephropathy: Secondary | ICD-10-CM

## 2017-08-23 DIAGNOSIS — Z1211 Encounter for screening for malignant neoplasm of colon: Secondary | ICD-10-CM

## 2017-08-23 DIAGNOSIS — Z1212 Encounter for screening for malignant neoplasm of rectum: Secondary | ICD-10-CM

## 2017-08-23 DIAGNOSIS — I739 Peripheral vascular disease, unspecified: Secondary | ICD-10-CM

## 2017-08-23 DIAGNOSIS — E538 Deficiency of other specified B group vitamins: Secondary | ICD-10-CM | POA: Diagnosis not present

## 2017-08-23 DIAGNOSIS — Z1231 Encounter for screening mammogram for malignant neoplasm of breast: Secondary | ICD-10-CM

## 2017-08-23 LAB — MICROALBUMIN / CREATININE URINE RATIO
CREATININE, U: 102.3 mg/dL
Microalb Creat Ratio: 2.6 mg/g (ref 0.0–30.0)
Microalb, Ur: 2.7 mg/dL — ABNORMAL HIGH (ref 0.0–1.9)

## 2017-08-23 LAB — LIPID PANEL
CHOL/HDL RATIO: 4
Cholesterol: 162 mg/dL (ref 0–200)
HDL: 39.7 mg/dL (ref >39.00)
LDL Cholesterol: 95 mg/dL (ref 0–99)
NONHDL: 122.48
Triglycerides: 137 mg/dL (ref 0.0–149.0)
VLDL: 27.4 mg/dL (ref 0.0–40.0)

## 2017-08-23 LAB — URINALYSIS, ROUTINE W REFLEX MICROSCOPIC
BILIRUBIN URINE: NEGATIVE
Ketones, ur: NEGATIVE
Nitrite: NEGATIVE
SPECIFIC GRAVITY, URINE: 1.02 (ref 1.000–1.030)
Total Protein, Urine: NEGATIVE
Urine Glucose: NEGATIVE
Urobilinogen, UA: 0.2 (ref 0.0–1.0)
pH: 5.5 (ref 5.0–8.0)

## 2017-08-23 LAB — FOLATE: FOLATE: 14.6 ng/mL (ref >5.9)

## 2017-08-23 LAB — CBC WITH DIFFERENTIAL/PLATELET
BASOS ABS: 0.1 10*3/uL (ref 0.0–0.1)
BASOS PCT: 1 % (ref 0.0–3.0)
EOS ABS: 0.1 10*3/uL (ref 0.0–0.7)
Eosinophils Relative: 0.7 % (ref 0.0–5.0)
HEMATOCRIT: 41.4 % (ref 36.0–46.0)
Hemoglobin: 13.3 g/dL (ref 12.0–15.0)
LYMPHS ABS: 3.1 10*3/uL (ref 0.7–4.0)
LYMPHS PCT: 40.4 % (ref 12.0–46.0)
MCHC: 32.2 g/dL (ref 30.0–36.0)
MCV: 85 fl (ref 78.0–100.0)
Monocytes Absolute: 0.5 10*3/uL (ref 0.1–1.0)
Monocytes Relative: 6 % (ref 3.0–12.0)
NEUTROS ABS: 3.9 10*3/uL (ref 1.4–7.7)
NEUTROS PCT: 51.9 % (ref 43.0–77.0)
PLATELETS: 250 10*3/uL (ref 150.0–400.0)
RBC: 4.86 Mil/uL (ref 3.87–5.11)
RDW: 13.2 % (ref 11.5–15.5)
WBC: 7.6 10*3/uL (ref 4.0–10.5)

## 2017-08-23 LAB — COMPREHENSIVE METABOLIC PANEL
ALT: 8 U/L (ref 0–35)
AST: 9 U/L (ref 0–37)
Albumin: 4.1 g/dL (ref 3.5–5.2)
Alkaline Phosphatase: 51 U/L (ref 39–117)
BUN: 14 mg/dL (ref 6–23)
CHLORIDE: 101 meq/L (ref 96–112)
CO2: 29 meq/L (ref 19–32)
CREATININE: 0.98 mg/dL (ref 0.40–1.20)
Calcium: 9.2 mg/dL (ref 8.4–10.5)
GFR: 71.24 mL/min (ref >60.00)
Glucose, Bld: 155 mg/dL — ABNORMAL HIGH (ref 70–99)
Potassium: 3.7 mEq/L (ref 3.5–5.1)
SODIUM: 141 meq/L (ref 135–145)
Total Bilirubin: 0.4 mg/dL (ref 0.2–1.2)
Total Protein: 7.6 g/dL (ref 6.0–8.3)

## 2017-08-23 LAB — HEMOGLOBIN A1C: Hgb A1c MFr Bld: 8.9 % — ABNORMAL HIGH (ref 4.6–6.5)

## 2017-08-23 LAB — VITAMIN B12: VITAMIN B 12: 70 pg/mL — AB (ref 211–911)

## 2017-08-23 LAB — TSH: TSH: 2.04 u[IU]/mL (ref 0.35–4.50)

## 2017-08-23 LAB — VITAMIN D 25 HYDROXY (VIT D DEFICIENCY, FRACTURES): VITD: 91.76 ng/mL (ref 30.00–100.00)

## 2017-08-23 LAB — HM DIABETES FOOT EXAM

## 2017-08-23 MED ORDER — SITAGLIP PHOS-METFORMIN HCL ER 50-1000 MG PO TB24: 1.0000 | ORAL_TABLET | Freq: Every day | ORAL | 1 refills | Status: DC

## 2017-08-23 NOTE — Telephone Encounter (Signed)
Copied from [place] [id number]. Topic: Quick Communication - Lab Results >> Aug 23, 2017  1:27 PM Cairrikier Dian Queen, CMA wrote: May give pt or pt dtr lab results when/if she calls back.  >> Aug 23, 2017  1:32 PM Bea Graff, NT wrote: Pts daughter, Michelle Lopez calling back to get her moms lab results.

## 2017-08-23 NOTE — Patient Instructions (Signed)

## 2017-08-23 NOTE — Progress Notes (Signed)
Subjective:  Patient ID: Michelle Lopez, female    DOB: 13-Oct-1945  Age: 72 y.o. MRN: [id number]  CC: Annual Exam; Hypertension; Hyperlipidemia; and Diabetes   HPI Michelle Lopez presents for a CPX.  She has not been compliant with some of her medications recently.  She is not sure which ones.  She has a home health care aide with her today.  Most of her medications are managed by her daughter who is not here today.  The patient tells me she has felt well lately with no episodes of CP, DOE, palpitations, edema, or fatigue.  She has no neurological complaints.  She does not monitor her blood sugar but she denies polys.  Past Medical History:  Diagnosis Date  . Cerebrovascular disease, unspecified   . Cocaine abuse, unspecified   . Diabetes mellitus    type 2, uncontrolled  . Diarrhea   . Essential hypertension, benign   . Hyperlipidemia   . Stroke [facility])    x7   Past Surgical History:  Procedure Laterality Date  . TOTAL HIP ARTHROPLASTY  1970   Dr. Rodell Perna    reports that she has never smoked. She has never used smokeless tobacco. She reports that she does not drink alcohol or use drugs. family history is not on file. Allergies  Allergen Reactions  . Crestor [Rosuvastatin Calcium]     myalgias  . Morphine Hives  . Pravastatin Sodium Rash  . Simvastatin Rash    Outpatient Medications Prior to Visit  Medication Sig Dispense Refill  . amLODipine (NORVASC) 5 MG tablet TAKE 1 TABLET BY MOUTH EVERY DAY 30 tablet 0  . aspirin 81 MG tablet Take 81 mg by mouth daily.      Marland Kitchen atorvastatin (LIPITOR) 20 MG tablet TAKE 1 TABLET BY MOUTH EVERY DAY 90 tablet 1  . Blood Glucose Monitoring Suppl (ONE TOUCH BASIC SYSTEM) W/DEVICE KIT Test up to TID dx: 250.42 1 each 1  . cetirizine (ZYRTEC) 10 MG tablet TAKE 1 TABLET BY MOUTH ONCE DAILY 30 tablet 11  . clopidogrel (PLAVIX) 75 MG tablet TAKE 1 TABLET BY MOUTH EVERY DAY 30 tablet 0  . glucose blood (ONETOUCH VERIO) test strip Use to check  blood sugars twice a day 300 each 1  . Lancets (ONETOUCH ULTRASOFT) lancets Use to check blood sugars twice a day 300 each 1  . metoprolol tartrate (LOPRESSOR) 25 MG tablet Take 0.5 tablets (12.5 mg total) by mouth 2 (two) times daily. 60 tablet 0  . tamsulosin (FLOMAX) 0.4 MG CAPS capsule Take 1 capsule (0.4 mg total) by mouth daily. 30 capsule 0  . Vitamin D, Ergocalciferol, (DRISDOL) 50000 units CAPS capsule TAKE 1 CAPSULE BY MOUTH EVERY 7 DAYS 12 capsule 1  . furosemide (LASIX) 20 MG tablet Take 1 tablet (20 mg total) by mouth daily. 30 tablet 0  . losartan (COZAAR) 100 MG tablet TAKE 1 TABLET BY MOUTH EVERY DAY 30 tablet 0  . metFORMIN (GLUCOPHAGE) 1000 MG tablet TAKE 1 TABLET BY MOUTH TWICE DAILY WITH A MEAL 60 tablet 0  . Suvorexant (BELSOMRA) 10 MG TABS Take 1 tablet by mouth at bedtime as needed. 30 tablet 3  . ZYRTEC ALLERGY 10 MG tablet take 1 TABLET BY MOUTH EVERY DAY  4   No facility-administered medications prior to visit.     ROS Review of Systems  Constitutional: Negative.  Negative for chills, diaphoresis, fatigue and fever.  HENT: Negative.  Negative for trouble swallowing.   Eyes: Negative for  visual disturbance.  Respiratory: Negative for cough, chest tightness, shortness of breath and wheezing.   Cardiovascular: Negative for chest pain, palpitations and leg swelling.  Gastrointestinal: Negative for abdominal pain, constipation, diarrhea, nausea and vomiting.  Endocrine: Negative for polydipsia, polyphagia and polyuria.  Genitourinary: Negative.  Negative for decreased urine volume, difficulty urinating and dysuria.  Musculoskeletal: Positive for gait problem. Negative for arthralgias, back pain, myalgias and neck pain.  Skin: Negative.  Negative for color change and pallor.  Allergic/Immunologic: Negative.   Neurological: Positive for weakness and numbness. Negative for dizziness, light-headedness and headaches.  Hematological: Negative for adenopathy. Does not  bruise/bleed easily.  Psychiatric/Behavioral: Negative.  Negative for decreased concentration and sleep disturbance. The patient is not nervous/anxious.     Objective:  BP (!) 158/68 (BP Location: Right Arm, Patient Position: Sitting, Cuff Size: Normal)   Pulse 69   Temp 98.2 F (36.8 C) (Oral)   Ht 5' (1.524 m)   Wt 128 lb (58.1 kg)   SpO2 96%   BMI 25.00 kg/m   BP Readings from Last 3 Encounters:  08/23/17 (!) 158/68  04/03/17 138/80  11/27/16 (!) 160/78    Wt Readings from Last 3 Encounters:  08/23/17 128 lb (58.1 kg)  04/03/17 131 lb (59.4 kg)  11/27/16 129 lb 8 oz (58.7 kg)    Physical Exam  Constitutional: She is oriented to person, place, and time. No distress.  HENT:  Mouth/Throat: Oropharynx is clear and moist. No oropharyngeal exudate.  Eyes: Conjunctivae are normal. No scleral icterus.  Neck: Normal range of motion. Neck supple. No JVD present. No thyromegaly present.  Cardiovascular: Normal rate, regular rhythm and normal heart sounds. Exam reveals no gallop.  No murmur heard. Pulmonary/Chest: Effort normal. She has no decreased breath sounds. She has no wheezes. She has no rhonchi. She has rales in the right lower field and the left lower field.  Abdominal: Soft. Normal appearance and bowel sounds are normal. She exhibits no mass. There is no hepatosplenomegaly. There is no tenderness. No hernia.  Musculoskeletal: Normal range of motion. She exhibits no edema, tenderness or deformity.  Lymphadenopathy:    She has no cervical adenopathy.  Neurological: She is alert and oriented to person, place, and time. She displays atrophy. She displays no tremor. No cranial nerve deficit or sensory deficit. She exhibits abnormal muscle tone. She displays a negative Romberg sign. Coordination and gait abnormal.  No new neuro deficits  Skin: Skin is warm and dry. She is not diaphoretic.  Psychiatric: She has a normal mood and affect. Her behavior is normal. Judgment and  thought content normal.  Vitals reviewed.   Lab Results  Component Value Date   WBC 7.6 08/23/2017   HGB 13.3 08/23/2017   HCT 41.4 08/23/2017   PLT 250.0 08/23/2017   GLUCOSE 155 (H) 08/23/2017   CHOL 162 08/23/2017   TRIG 137.0 08/23/2017   HDL 39.70 08/23/2017   LDLDIRECT 100.0 07/06/2015   LDLCALC 95 08/23/2017   ALT 8 08/23/2017   AST 9 08/23/2017   NA 141 08/23/2017   K 3.7 08/23/2017   CL 101 08/23/2017   CREATININE 0.98 08/23/2017   BUN 14 08/23/2017   CO2 29 08/23/2017   TSH 2.04 08/23/2017   INR 1.1 (H) 02/25/2015   HGBA1C 8.9 (H) 08/23/2017   MICROALBUR 2.7 (H) 08/23/2017    Dg Hips Bilat With Pelvis Min 5 Views  Result Date: 04/03/2017 CLINICAL DATA:  Bilateral hip pain with walking for the last week,  no known injury EXAM: DG HIP (WITH OR WITHOUT PELVIS) 5+V BILAT COMPARISON:  None. FINDINGS: Left total hip replacement is noted. The acetabular and femoral components appear to be in good position with no complicating features. The femoral stem appears well positioned with no evidence of fracture. There are moderate D degenerative joint disease changes involving the right hip with loss of joint space and sclerosis with spurring and subchondral cyst formation. However no acute fracture is seen. The pelvic rami are intact. The SI joints appear corticated. IMPRESSION: 1. 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 2. 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 No acute abnormality. Electronically Signed   By: Ivar Drape M.D.   On: 04/03/2017 16:45    Assessment & Plan:   Giavana was seen today for annual exam, hypertension, hyperlipidemia and diabetes.  Diagnoses and all orders for this visit:  Type 2 diabetes mellitus with diabetic nephropathy, with long-term current use of insulin ([place])- Her blood sugars are not well controlled.  Her A1c is up to 8.9%.  I will increase the dose of metformin and will add a DPP 4  inhibitor. -     Microalbumin / creatinine urine ratio; Future -     Comprehensive metabolic panel; Future -     Hemoglobin A1c; Future -     SitaGLIPtin-MetFORMIN HCl (JANUMET XR) 50-1000 MG TB24; Take 1 tablet by mouth daily.  Essential hypertension, benign- Her BP is not adequately well controlled.  She is not compliant with the loop diuretic.  Will discontinue the current ARB and will upgrade her to a more potent ARB and will add a thiazide diuretic. -     CBC with Differential/Platelet; Future -     Urinalysis, Routine w reflex microscopic; Future -     Comprehensive metabolic panel; Future  PAD (peripheral artery disease) ([place])- She is asymptomatic with respect to this and exam shows no complications.  Will continue risk factor modifications. -     Lipid panel; Future  Hyperlipidemia with target LDL less than 70- She has achieved her LDL goal and is doing well on the statin. -     Lipid panel; Future -     TSH; Future  Routine general medical examination at a health care facility  Vitamin D deficiency -     VITAMIN D 25 Hydroxy (Vit-D Deficiency, Fractures); Future  B12 deficiency- Her B12 level is quite low.  I have asked her to come in to start parenteral B12 replacement therapy. -     Folate; Future -     Vitamin B12; Future  Visit for screening mammogram -     MM DIGITAL SCREENING BILATERAL; Future  Need for pneumococcal vaccination -     Pneumococcal polysaccharide vaccine 23-valent greater than or equal to 2yo subcutaneous/IM  Colon cancer screening -     Cologuard  Screening for malignant neoplasm of the rectum -     Cologuard   I have discontinued Michelle Lopez Suvorexant, ZYRTEC ALLERGY, furosemide, metFORMIN, and losartan. I am also having her start on SitaGLIPtin-MetFORMIN HCl and olmesartan-hydrochlorothiazide. Additionally, I am having her maintain her aspirin, ONE TOUCH BASIC SYSTEM, cetirizine, onetouch ultrasoft, glucose blood, Vitamin D  (Ergocalciferol), tamsulosin, atorvastatin, metoprolol tartrate, amLODipine, and clopidogrel.  Meds ordered this encounter  Medications  . SitaGLIPtin-MetFORMIN HCl (JANUMET XR) 50-1000 MG TB24    Sig: Take 1 tablet by mouth daily.    Dispense:  90 tablet    Refill:  1  . olmesartan-hydrochlorothiazide (BENICAR HCT) 40-12.5 MG tablet    Sig: Take 1 tablet by mouth daily.    Dispense:  90 tablet    Refill:  1   See AVS for instructions about healthy living and anticipatory guidance.  Follow-up: Return in about 4 months (around 12/24/2017).  Michelle Calico, MD

## 2017-08-24 NOTE — Telephone Encounter (Signed)
Results given and documented in result note. 

## 2017-08-27 ENCOUNTER — Ambulatory Visit (INDEPENDENT_AMBULATORY_CARE_PROVIDER_SITE_OTHER): Payer: Medicare Other

## 2017-08-27 ENCOUNTER — Other Ambulatory Visit: Payer: Self-pay | Admitting: Internal Medicine

## 2017-08-27 DIAGNOSIS — E1121 Type 2 diabetes mellitus with diabetic nephropathy: Secondary | ICD-10-CM

## 2017-08-27 DIAGNOSIS — E538 Deficiency of other specified B group vitamins: Secondary | ICD-10-CM

## 2017-08-27 DIAGNOSIS — I10 Essential (primary) hypertension: Secondary | ICD-10-CM

## 2017-08-27 DIAGNOSIS — Z794 Long term (current) use of insulin: Principal | ICD-10-CM

## 2017-08-27 MED ORDER — OLMESARTAN MEDOXOMIL-HCTZ 40-12.5 MG PO TABS: 1.0000 | ORAL_TABLET | Freq: Every day | ORAL | 1 refills | Status: DC

## 2017-08-27 MED ORDER — CYANOCOBALAMIN 1000 MCG/ML IJ SOLN
1000.0000 ug | Freq: Once | INTRAMUSCULAR | Status: AC
  Administered 2017-08-27: 1000 ug via INTRAMUSCULAR

## 2017-08-27 NOTE — Assessment & Plan Note (Signed)

## 2017-08-27 NOTE — Progress Notes (Signed)
I have reviewed and agree.

## 2017-08-28 ENCOUNTER — Other Ambulatory Visit: Payer: Self-pay | Admitting: Internal Medicine

## 2017-08-28 DIAGNOSIS — I10 Essential (primary) hypertension: Secondary | ICD-10-CM

## 2017-08-28 DIAGNOSIS — E1121 Type 2 diabetes mellitus with diabetic nephropathy: Secondary | ICD-10-CM

## 2017-08-28 MED ORDER — LOSARTAN POTASSIUM-HCTZ 100-12.5 MG PO TABS: 1.0000 | ORAL_TABLET | Freq: Every day | ORAL | 1 refills | Status: DC

## 2017-09-03 ENCOUNTER — Telehealth: Payer: Self-pay

## 2017-09-03 ENCOUNTER — Ambulatory Visit: Payer: Medicare Other

## 2017-09-03 ENCOUNTER — Ambulatory Visit (INDEPENDENT_AMBULATORY_CARE_PROVIDER_SITE_OTHER): Payer: Medicare Other | Admitting: *Deleted

## 2017-09-03 DIAGNOSIS — E538 Deficiency of other specified B group vitamins: Secondary | ICD-10-CM | POA: Diagnosis not present

## 2017-09-03 MED ORDER — CYANOCOBALAMIN 1000 MCG/ML IJ SOLN
1000.0000 ug | Freq: Once | INTRAMUSCULAR | Status: AC
  Administered 2017-09-03: 1000 ug via INTRAMUSCULAR

## 2017-09-03 NOTE — Telephone Encounter (Signed)
Per dr Ronnald Ramp, patient needs to get weekly b12 injections for one month, then once monthly injections---until she sees him again for diabetes follow up (4-6 months)

## 2017-09-03 NOTE — Progress Notes (Signed)
I have reviewed and agree.

## 2017-09-10 ENCOUNTER — Ambulatory Visit (INDEPENDENT_AMBULATORY_CARE_PROVIDER_SITE_OTHER): Payer: Medicare Other

## 2017-09-10 DIAGNOSIS — E538 Deficiency of other specified B group vitamins: Secondary | ICD-10-CM

## 2017-09-10 MED ORDER — CYANOCOBALAMIN 1000 MCG/ML IJ SOLN
1000.0000 ug | Freq: Once | INTRAMUSCULAR | Status: AC
  Administered 2017-09-10: 1000 ug via INTRAMUSCULAR

## 2017-09-12 ENCOUNTER — Other Ambulatory Visit: Payer: Self-pay | Admitting: Internal Medicine

## 2017-09-12 DIAGNOSIS — I739 Peripheral vascular disease, unspecified: Secondary | ICD-10-CM

## 2017-09-16 ENCOUNTER — Other Ambulatory Visit: Payer: Self-pay | Admitting: Internal Medicine

## 2017-09-16 DIAGNOSIS — E559 Vitamin D deficiency, unspecified: Secondary | ICD-10-CM

## 2017-09-16 DIAGNOSIS — M8580 Other specified disorders of bone density and structure, unspecified site: Secondary | ICD-10-CM

## 2017-10-01 ENCOUNTER — Telehealth: Payer: Self-pay | Admitting: Internal Medicine

## 2017-10-01 ENCOUNTER — Other Ambulatory Visit: Payer: Self-pay | Admitting: Internal Medicine

## 2017-10-01 DIAGNOSIS — I10 Essential (primary) hypertension: Secondary | ICD-10-CM

## 2017-10-01 MED ORDER — METOPROLOL TARTRATE 25 MG PO TABS: 12.5000 mg | ORAL_TABLET | Freq: Two times a day (BID) | ORAL | 1 refills | Status: DC

## 2017-10-01 NOTE — Telephone Encounter (Signed)
Contacted dtr and informed that the olmesartan HCTZ was changed to losartan HCTZ due to cost. Erx of metoprolol sent to [facility]. Pt dtr informed of same.

## 2017-10-01 NOTE — Telephone Encounter (Signed)
Copied from [place] [id number]. Topic: Quick Communication - Rx Refill/Question >> Oct 01, 2017  2:27 PM Oliver Pila B wrote: Medication: losartan-hydrochlorothiazide (HYZAAR) 100-12.5 MG tablet [[id number]]  metoprolol tartrate (LOPRESSOR) 25 MG tablet [[id number]]   Pt's daughter called b/c she says the pt is not receiving her full Rx b/c the pharmacy wont release the medication over to her; daughter says she's has been given partial amounts of the medication and the pt is now out completely; she states the pharmacy is stating that the pharmacy is needing approval from physician to refill medication

## 2017-10-09 ENCOUNTER — Ambulatory Visit (INDEPENDENT_AMBULATORY_CARE_PROVIDER_SITE_OTHER): Payer: Medicare Other | Admitting: *Deleted

## 2017-10-09 DIAGNOSIS — E538 Deficiency of other specified B group vitamins: Secondary | ICD-10-CM

## 2017-10-09 MED ORDER — CYANOCOBALAMIN 1000 MCG/ML IJ SOLN
1000.0000 ug | Freq: Once | INTRAMUSCULAR | Status: AC
  Administered 2017-10-09: 1000 ug via INTRAMUSCULAR

## 2017-10-09 NOTE — Progress Notes (Signed)
I have reviewed and agree.

## 2017-10-13 ENCOUNTER — Other Ambulatory Visit: Payer: Self-pay | Admitting: Internal Medicine

## 2017-10-13 DIAGNOSIS — I739 Peripheral vascular disease, unspecified: Secondary | ICD-10-CM

## 2017-10-17 ENCOUNTER — Encounter: Payer: Self-pay | Admitting: Internal Medicine

## 2017-10-17 ENCOUNTER — Ambulatory Visit (INDEPENDENT_AMBULATORY_CARE_PROVIDER_SITE_OTHER): Payer: Medicare Other | Admitting: Internal Medicine

## 2017-10-17 VITALS — BP 150/80 | HR 58 | Temp 98.1°F | Resp 12 | Ht 60.0 in | Wt 120.0 lb

## 2017-10-17 DIAGNOSIS — E441 Mild protein-calorie malnutrition: Secondary | ICD-10-CM | POA: Diagnosis not present

## 2017-10-17 DIAGNOSIS — F0390 Unspecified dementia without behavioral disturbance: Secondary | ICD-10-CM | POA: Diagnosis not present

## 2017-10-17 DIAGNOSIS — Z23 Encounter for immunization: Secondary | ICD-10-CM | POA: Diagnosis not present

## 2017-10-17 DIAGNOSIS — F5104 Psychophysiologic insomnia: Secondary | ICD-10-CM | POA: Diagnosis not present

## 2017-10-17 DIAGNOSIS — Z794 Long term (current) use of insulin: Secondary | ICD-10-CM

## 2017-10-17 DIAGNOSIS — I63013 Cerebral infarction due to thrombosis of bilateral vertebral arteries: Secondary | ICD-10-CM | POA: Diagnosis not present

## 2017-10-17 DIAGNOSIS — I69391 Dysphagia following cerebral infarction: Secondary | ICD-10-CM | POA: Diagnosis not present

## 2017-10-17 DIAGNOSIS — E1121 Type 2 diabetes mellitus with diabetic nephropathy: Secondary | ICD-10-CM | POA: Diagnosis not present

## 2017-10-17 DIAGNOSIS — R2689 Other abnormalities of gait and mobility: Secondary | ICD-10-CM

## 2017-10-17 DIAGNOSIS — I10 Essential (primary) hypertension: Secondary | ICD-10-CM

## 2017-10-17 MED ORDER — ENSURE COMPLETE PO LIQD: 237.0000 mL | Freq: Two times a day (BID) | ORAL | 5 refills | Status: DC

## 2017-10-17 MED ORDER — ESZOPICLONE 2 MG PO TABS: 2.0000 mg | ORAL_TABLET | Freq: Every evening | ORAL | 5 refills | Status: DC | PRN

## 2017-10-17 MED ORDER — SITAGLIP PHOS-METFORMIN HCL ER 50-1000 MG PO TB24: 1.0000 | ORAL_TABLET | Freq: Every day | ORAL | 1 refills | Status: DC

## 2017-10-17 NOTE — Patient Instructions (Signed)

## 2017-10-17 NOTE — Progress Notes (Signed)
Subjective:  Patient ID: Michelle Lopez, female    DOB: [birth year]  Age: 72 y.o. MRN: [id number]  CC: Hypertension and Diabetes   HPI Michelle Lopez presents for f/up- She is with her daughter today.  There are no new complaints.  Her daughter just wants to make sure she is taking all the right medications.  The patient was not sure which type of metformin she should be taking.  Her daughter asked me to write a prescription for Ensure to try to get the patient to gain weight.  Reason for Visit: Mobility Evaluation    A rolling walker will assist the patient in the home with ADLs; including cooking, cleaning and toilet ing. A cane or manual wheel chair will not assist patient efficiently due to muscle atrophy, CVA of bilateral vertebral arteries and weakness. In addition, a power scooter will not be adequate due to the patients lack of ability to grip the tiller type steering of the equipment nor is patient able to maneuver a wheel chair. Patient is willing to use a rollator in the home as well as outside of the home.   Outpatient Medications Prior to Visit  Medication Sig Dispense Refill  . amLODipine (NORVASC) 5 MG tablet TAKE 1 TABLET BY MOUTH EVERY DAY 90 tablet 1  . aspirin 81 MG tablet Take 81 mg by mouth daily.      Marland Kitchen atorvastatin (LIPITOR) 20 MG tablet TAKE 1 TABLET BY MOUTH EVERY DAY 90 tablet 1  . Blood Glucose Monitoring Suppl (ONE TOUCH BASIC SYSTEM) W/DEVICE KIT Test up to TID dx: 250.42 1 each 1  . cetirizine (ZYRTEC) 10 MG tablet TAKE 1 TABLET BY MOUTH ONCE DAILY 30 tablet 11  . clopidogrel (PLAVIX) 75 MG tablet TAKE 1 TABLET BY MOUTH EVERY DAY 90 tablet 1  . glucose blood (ONETOUCH VERIO) test strip Use to check blood sugars twice a day 300 each 1  . Lancets (ONETOUCH ULTRASOFT) lancets Use to check blood sugars twice a day 300 each 1  . losartan-hydrochlorothiazide (HYZAAR) 100-12.5 MG tablet Take 1 tablet by mouth daily. 90 tablet 1  . metoprolol tartrate (LOPRESSOR) 25 MG  tablet Take 0.5 tablets (12.5 mg total) by mouth 2 (two) times daily. 180 tablet 1  . tamsulosin (FLOMAX) 0.4 MG CAPS capsule Take 1 capsule (0.4 mg total) by mouth daily. 30 capsule 0  . Vitamin D, Ergocalciferol, (DRISDOL) 50000 units CAPS capsule TAKE 1 CAPSULE BY MOUTH EVERY 7 DAYS 12 capsule 1  . metFORMIN (GLUCOPHAGE) 1000 MG tablet TAKE 1 TABLET BY MOUTH TWICE DAILY WITH A MEAL 180 tablet 1  . SitaGLIPtin-MetFORMIN HCl (JANUMET XR) 50-1000 MG TB24 Take 1 tablet by mouth daily. 90 tablet 1   No facility-administered medications prior to visit.     ROS Review of Systems  Constitutional: Negative for appetite change, diaphoresis, fatigue and fever.  HENT: Negative.   Eyes: Negative for visual disturbance.  Respiratory: Negative for cough, chest tightness, shortness of breath and wheezing.   Cardiovascular: Negative for chest pain, palpitations and leg swelling.  Gastrointestinal: Negative for abdominal pain, constipation, diarrhea, nausea and vomiting.  Endocrine: Negative for polydipsia, polyphagia and polyuria.  Genitourinary: Negative.  Negative for difficulty urinating.  Musculoskeletal: Positive for gait problem. Negative for arthralgias and myalgias.  Skin: Negative.  Negative for color change.  Neurological: Positive for dizziness, weakness and numbness. Negative for light-headedness and headaches.  Hematological: Negative for adenopathy. Does not bruise/bleed easily.  Psychiatric/Behavioral: Positive for sleep disturbance. Negative for  decreased concentration, dysphoric mood and suicidal ideas. The patient is not nervous/anxious.     Objective:  BP (!) 150/80 (BP Location: Left Arm, Patient Position: Sitting, Cuff Size: Normal)   Pulse (!) 58   Temp 98.1 F (36.7 C) (Oral)   Resp 12   Ht 5' (1.524 m)   Wt 120 lb (54.4 kg)   SpO2 96%   BMI 23.44 kg/m   BP Readings from Last 3 Encounters:  10/17/17 (!) 150/80  08/23/17 (!) 158/68  04/03/17 138/80    Wt Readings  from Last 3 Encounters:  10/17/17 120 lb (54.4 kg)  08/23/17 128 lb (58.1 kg)  04/03/17 131 lb (59.4 kg)    Physical Exam  Constitutional: She is oriented to person, place, and time. No distress.  HENT:  Mouth/Throat: Oropharynx is clear and moist. No oropharyngeal exudate.  Eyes: Conjunctivae are normal. No scleral icterus.  Neck: Normal range of motion. Neck supple. No JVD present. No thyromegaly present.  Cardiovascular: Normal rate, regular rhythm and normal heart sounds. Exam reveals no gallop.  No murmur heard. Pulmonary/Chest: Effort normal and breath sounds normal. No respiratory distress. She has no wheezes. She has no rales.  Abdominal: Soft. Bowel sounds are normal. She exhibits no mass. There is no tenderness.  Musculoskeletal: Normal range of motion. She exhibits no edema, tenderness or deformity.  Lymphadenopathy:    She has no cervical adenopathy.  Neurological: She is alert and oriented to person, place, and time. She displays atrophy. She displays no tremor. A sensory deficit is present. No cranial nerve deficit. She exhibits normal muscle tone. She displays a negative Romberg sign. She displays no seizure activity. Coordination and gait abnormal.  Skin: Skin is warm and dry. She is not diaphoretic. No pallor.  Psychiatric: She has a normal mood and affect. Her behavior is normal. Judgment and thought content normal.  Vitals reviewed.   Lab Results  Component Value Date   WBC 7.6 08/23/2017   HGB 13.3 08/23/2017   HCT 41.4 08/23/2017   PLT 250.0 08/23/2017   GLUCOSE 155 (H) 08/23/2017   CHOL 162 08/23/2017   TRIG 137.0 08/23/2017   HDL 39.70 08/23/2017   LDLDIRECT 100.0 07/06/2015   LDLCALC 95 08/23/2017   ALT 8 08/23/2017   AST 9 08/23/2017   NA 141 08/23/2017   K 3.7 08/23/2017   CL 101 08/23/2017   CREATININE 0.98 08/23/2017   BUN 14 08/23/2017   CO2 29 08/23/2017   TSH 2.04 08/23/2017   INR 1.1 (H) 02/25/2015   HGBA1C 8.9 (H) 08/23/2017    MICROALBUR 2.7 (H) 08/23/2017    Dg Hips Bilat With Pelvis Min 5 Views  Result Date: 04/03/2017 CLINICAL DATA:  Bilateral hip pain with walking for the last week, no known injury EXAM: DG HIP (WITH OR WITHOUT PELVIS) 5+V BILAT COMPARISON:  None. FINDINGS: Left total hip replacement is noted. The acetabular and femoral components appear to be in good position with no complicating features. The femoral stem appears well positioned with no evidence of fracture. There are moderate D degenerative joint disease changes involving the right hip with loss of joint space and sclerosis with spurring and subchondral cyst formation. However no acute fracture is seen. The pelvic rami are intact. The SI joints appear corticated. IMPRESSION: 1. 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 2. 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 No acute abnormality. Electronically Signed   By: Windy Canny.D.  On: 04/03/2017 16:45    Assessment & Plan:   Michelle Lopez was seen today for hypertension and diabetes.  Diagnoses and all orders for this visit:  Need for influenza vaccination -     Flu vaccine HIGH DOSE PF (Fluzone High dose)  Essential hypertension, benign-her blood pressure is adequately well controlled.  Dementia arising in the senium and presenium -     feeding supplement, ENSURE COMPLETE, (ENSURE COMPLETE) LIQD; Take 237 mLs by mouth 2 (two) times daily between meals.  Dysphagia as late effect of stroke -     feeding supplement, ENSURE COMPLETE, (ENSURE COMPLETE) LIQD; Take 237 mLs by mouth 2 (two) times daily between meals.  Protein-calorie malnutrition, mild (HCC) -     feeding supplement, ENSURE COMPLETE, (ENSURE COMPLETE) LIQD; Take 237 mLs by mouth 2 (two) times daily between meals.  Type 2 diabetes mellitus with diabetic nephropathy, with long-term current use of insulin ([place])- Her last A1c was 8.9%.  Her blood sugars were not well controlled.  I  reiterated that the patient should be taking Janumet, not metformin individually. -     SitaGLIPtin-MetFORMIN HCl (JANUMET XR) 50-1000 MG TB24; Take 1 tablet by mouth daily.  Psychophysiological insomnia -     eszopiclone (LUNESTA) 2 MG TABS tablet; Take 1 tablet (2 mg total) by mouth at bedtime as needed for sleep. Take immediately before bedtime   I have discontinued Michelle Lopez's metFORMIN. I am also having her start on feeding supplement (ENSURE COMPLETE) and eszopiclone. Additionally, I am having her maintain her aspirin, ONE TOUCH BASIC SYSTEM, cetirizine, onetouch ultrasoft, glucose blood, tamsulosin, atorvastatin, losartan-hydrochlorothiazide, Vitamin D (Ergocalciferol), metoprolol tartrate, amLODipine, clopidogrel, and SitaGLIPtin-MetFORMIN HCl.  Meds ordered this encounter  Medications  . feeding supplement, ENSURE COMPLETE, (ENSURE COMPLETE) LIQD    Sig: Take 237 mLs by mouth 2 (two) times daily between meals.    Dispense:  60 Bottle    Refill:  5  . SitaGLIPtin-MetFORMIN HCl (JANUMET XR) 50-1000 MG TB24    Sig: Take 1 tablet by mouth daily.    Dispense:  90 tablet    Refill:  1  . eszopiclone (LUNESTA) 2 MG TABS tablet    Sig: Take 1 tablet (2 mg total) by mouth at bedtime as needed for sleep. Take immediately before bedtime    Dispense:  30 tablet    Refill:  5     Follow-up: Return in about 3 months (around 01/16/2018).  Michelle Calico, MD

## 2017-10-19 ENCOUNTER — Ambulatory Visit: Payer: Medicare Other

## 2017-10-23 ENCOUNTER — Other Ambulatory Visit: Payer: Self-pay | Admitting: Internal Medicine

## 2017-11-06 DIAGNOSIS — H11233 Symblepharon, bilateral: Secondary | ICD-10-CM | POA: Diagnosis not present

## 2017-11-06 DIAGNOSIS — H04123 Dry eye syndrome of bilateral lacrimal glands: Secondary | ICD-10-CM | POA: Diagnosis not present

## 2017-11-08 ENCOUNTER — Ambulatory Visit: Payer: Medicare Other

## 2017-11-09 ENCOUNTER — Other Ambulatory Visit: Payer: Self-pay

## 2017-11-09 MED ORDER — CETIRIZINE HCL 10 MG PO TABS: 10.0000 mg | ORAL_TABLET | Freq: Every day | ORAL | 3 refills | Status: DC

## 2017-11-21 ENCOUNTER — Ambulatory Visit: Payer: Medicare Other

## 2017-11-29 DIAGNOSIS — H11233 Symblepharon, bilateral: Secondary | ICD-10-CM | POA: Diagnosis not present

## 2017-11-29 DIAGNOSIS — H02833 Dermatochalasis of right eye, unspecified eyelid: Secondary | ICD-10-CM | POA: Diagnosis not present

## 2017-11-29 DIAGNOSIS — H01003 Unspecified blepharitis right eye, unspecified eyelid: Secondary | ICD-10-CM | POA: Diagnosis not present

## 2017-11-29 DIAGNOSIS — H04123 Dry eye syndrome of bilateral lacrimal glands: Secondary | ICD-10-CM | POA: Diagnosis not present

## 2017-12-31 DIAGNOSIS — H04123 Dry eye syndrome of bilateral lacrimal glands: Secondary | ICD-10-CM | POA: Diagnosis not present

## 2017-12-31 DIAGNOSIS — H16223 Keratoconjunctivitis sicca, not specified as Sjogren's, bilateral: Secondary | ICD-10-CM | POA: Diagnosis not present

## 2017-12-31 DIAGNOSIS — H11233 Symblepharon, bilateral: Secondary | ICD-10-CM | POA: Diagnosis not present

## 2018-01-16 ENCOUNTER — Ambulatory Visit: Payer: Medicare Other | Admitting: Internal Medicine

## 2018-02-14 ENCOUNTER — Other Ambulatory Visit (INDEPENDENT_AMBULATORY_CARE_PROVIDER_SITE_OTHER): Payer: Medicare Other

## 2018-02-14 ENCOUNTER — Ambulatory Visit (INDEPENDENT_AMBULATORY_CARE_PROVIDER_SITE_OTHER): Payer: Medicare Other | Admitting: Internal Medicine

## 2018-02-14 ENCOUNTER — Encounter: Payer: Self-pay | Admitting: Internal Medicine

## 2018-02-14 VITALS — BP 124/56 | HR 73 | Temp 98.5°F | Resp 16 | Ht 60.0 in | Wt 145.2 lb

## 2018-02-14 DIAGNOSIS — Z794 Long term (current) use of insulin: Secondary | ICD-10-CM | POA: Diagnosis not present

## 2018-02-14 DIAGNOSIS — I6300 Cerebral infarction due to thrombosis of unspecified precerebral artery: Secondary | ICD-10-CM | POA: Diagnosis not present

## 2018-02-14 DIAGNOSIS — E441 Mild protein-calorie malnutrition: Secondary | ICD-10-CM

## 2018-02-14 DIAGNOSIS — Z1159 Encounter for screening for other viral diseases: Secondary | ICD-10-CM | POA: Insufficient documentation

## 2018-02-14 DIAGNOSIS — N184 Chronic kidney disease, stage 4 (severe): Secondary | ICD-10-CM

## 2018-02-14 DIAGNOSIS — E1121 Type 2 diabetes mellitus with diabetic nephropathy: Secondary | ICD-10-CM

## 2018-02-14 DIAGNOSIS — E538 Deficiency of other specified B group vitamins: Secondary | ICD-10-CM

## 2018-02-14 DIAGNOSIS — I10 Essential (primary) hypertension: Secondary | ICD-10-CM | POA: Diagnosis not present

## 2018-02-14 LAB — CBC WITH DIFFERENTIAL/PLATELET
Basophils Absolute: 0.1 10*3/uL (ref 0.0–0.1)
Basophils Relative: 1.2 % (ref 0.0–3.0)
Eosinophils Absolute: 0.1 10*3/uL (ref 0.0–0.7)
Eosinophils Relative: 0.7 % (ref 0.0–5.0)
HCT: 39.9 % (ref 36.0–46.0)
Hemoglobin: 13 g/dL (ref 12.0–15.0)
Lymphocytes Relative: 39.1 % (ref 12.0–46.0)
Lymphs Abs: 2.9 10*3/uL (ref 0.7–4.0)
MCHC: 32.6 g/dL (ref 30.0–36.0)
MCV: 81.2 fl (ref 78.0–100.0)
Monocytes Absolute: 0.5 10*3/uL (ref 0.1–1.0)
Monocytes Relative: 7 % (ref 3.0–12.0)
Neutro Abs: 3.9 10*3/uL (ref 1.4–7.7)
Neutrophils Relative %: 52 % (ref 43.0–77.0)
Platelets: 256 10*3/uL (ref 150.0–400.0)
RBC: 4.91 Mil/uL (ref 3.87–5.11)
RDW: 12.7 % (ref 11.5–15.5)
WBC: 7.5 10*3/uL (ref 4.0–10.5)

## 2018-02-14 LAB — BASIC METABOLIC PANEL
BUN: 29 mg/dL — ABNORMAL HIGH (ref 6–23)
CO2: 28 mEq/L (ref 19–32)
Calcium: 9.5 mg/dL (ref 8.4–10.5)
Chloride: 97 mEq/L (ref 96–112)
Creatinine, Ser: 1.48 mg/dL — ABNORMAL HIGH (ref 0.40–1.20)
GFR: 44.22 mL/min — AB (ref >60.00)
GLUCOSE: 284 mg/dL — AB (ref 70–99)
Potassium: 3.7 mEq/L (ref 3.5–5.1)
Sodium: 136 mEq/L (ref 135–145)

## 2018-02-14 LAB — HEMOGLOBIN A1C: Hgb A1c MFr Bld: 9.3 % — ABNORMAL HIGH (ref 4.6–6.5)

## 2018-02-14 MED ORDER — GLUCOSE BLOOD VI STRP: ORAL_STRIP | 1 refills | Status: DC

## 2018-02-14 MED ORDER — CONTOUR NEXT EZ W/DEVICE KIT: 1.0000 | PACK | Freq: Two times a day (BID) | 0 refills | Status: DC

## 2018-02-14 MED ORDER — CYANOCOBALAMIN 1000 MCG/ML IJ SOLN
1000.0000 ug | Freq: Once | INTRAMUSCULAR | Status: AC
  Administered 2018-02-14: 1000 ug via INTRAMUSCULAR

## 2018-02-14 MED ORDER — GLUCOSE BLOOD VI STRP: ORAL_STRIP | 3 refills | Status: DC

## 2018-02-14 NOTE — Patient Instructions (Signed)
Type 2 Diabetes Mellitus, Diagnosis, Adult Type 2 diabetes (type 2 diabetes mellitus) is a long-term (chronic) disease. In type 2 diabetes, one or both of these problems may be present:  The pancreas does not make enough of a hormone called insulin.  Cells in the body do not respond properly to insulin that the body makes (insulin resistance). Normally, insulin allows blood sugar (glucose) to enter cells in the body. The cells use glucose for energy. Insulin resistance or lack of insulin causes excess glucose to build up in the blood instead of going into cells. As a result, high blood glucose (hyperglycemia) develops. What increases the risk? The following factors may make you more likely to develop type 2 diabetes:  Having a family member with type 2 diabetes.  Being overweight or obese.  Having an inactive (sedentary) lifestyle.  Having been diagnosed with insulin resistance.  Having a history of prediabetes, gestational diabetes, or polycystic ovary syndrome (PCOS).  Being of American-Indian, African-American, Hispanic/Latino, or Asian/Pacific Islander descent. What are the signs or symptoms? In the early stage of this condition, you may not have symptoms. Symptoms develop slowly and may include:  Increased thirst (polydipsia).  Increased hunger(polyphagia).  Increased urination (polyuria).  Increased urination during the night (nocturia).  Unexplained weight loss.  Frequent infections that keep coming back (recurring).  Fatigue.  Weakness.  Vision changes, such as blurry vision.  Cuts or bruises that are slow to heal.  Tingling or numbness in the hands or feet.  Dark patches on the skin (acanthosis nigricans). How is this diagnosed? This condition is diagnosed based on your symptoms, your medical history, a physical exam, and your blood glucose level. Your blood glucose may be checked with one or more of the following blood tests:  A fasting blood glucose (FBG)  test. You will not be allowed to eat (you will fast) for 8 hours or longer before a blood sample is taken.  A random blood glucose test. This test checks blood glucose at any time of day regardless of when you ate.  An A1c (hemoglobin A1c) blood test. This test provides information about blood glucose control over the previous 2-3 months.  An oral glucose tolerance test (OGTT). This test measures your blood glucose at two times: ? After fasting. This is your baseline blood glucose level. ? Two hours after drinking a beverage that contains glucose. You may be diagnosed with type 2 diabetes if:  Your FBG level is 126 mg/dL (7.0 mmol/L) or higher.  Your random blood glucose level is 200 mg/dL (11.1 mmol/L) or higher.  Your A1c level is 6.5% or higher.  Your OGTT result is higher than 200 mg/dL (11.1 mmol/L). These blood tests may be repeated to confirm your diagnosis. How is this treated? Your treatment may be managed by a specialist called an endocrinologist. Type 2 diabetes may be treated by following instructions from your health care provider about:  Making diet and lifestyle changes. This may include: ? Following an individualized nutrition plan that is developed by a diet and nutrition specialist (registered dietitian). ? Exercising regularly. ? Finding ways to manage stress.  Checking your blood glucose level as often as told.  Taking diabetes medicines or insulin daily. This helps to keep your blood glucose levels in the healthy range. ? If you use insulin, you may need to adjust the dosage depending on how physically active you are and what foods you eat. Your health care provider will tell you how to adjust your dosage.    Taking medicines to help prevent complications from diabetes, such as: ? Aspirin. ? Medicine to lower cholesterol. ? Medicine to control blood pressure. Your health care provider will set individualized treatment goals for you. Your goals will be based on  your age, other medical conditions you have, and how you respond to diabetes treatment. Generally, the goal of treatment is to maintain the following blood glucose levels:  Before meals (preprandial): 80-130 mg/dL (4.4-7.2 mmol/L).  After meals (postprandial): below 180 mg/dL (10 mmol/L).  A1c level: less than 7%. Follow these instructions at home: Questions to ask your health care provider  Consider asking the following questions: ? Do I need to meet with a diabetes educator? ? Where can I find a support group for people with diabetes? ? What equipment will I need to manage my diabetes at home? ? What diabetes medicines do I need, and when should I take them? ? How often do I need to check my blood glucose? ? What number can I call if I have questions? ? When is my next appointment? General instructions  Take over-the-counter and prescription medicines only as told by your health care provider.  Keep all follow-up visits as told by your health care provider. This is important.  For more information about diabetes, visit: ? American Diabetes Association (ADA): www.diabetes.org ? American Association of Diabetes Educators (AADE): www.diabeteseducator.org Contact a health care provider if:  Your blood glucose is at or above 240 mg/dL (13.3 mmol/L) for 2 days in a row.  You have been sick or have had a fever for 2 days or longer, and you are not getting better.  You have any of the following problems for more than 6 hours: ? You cannot eat or drink. ? You have nausea and vomiting. ? You have diarrhea. Get help right away if:  Your blood glucose is lower than 54 mg/dL (3.0 mmol/L).  You become confused or you have trouble thinking clearly.  You have difficulty breathing.  You have moderate or large ketone levels in your urine. Summary  Type 2 diabetes (type 2 diabetes mellitus) is a long-term (chronic) disease. In type 2 diabetes, the pancreas does not make enough of a  hormone called insulin, or cells in the body do not respond properly to insulin that the body makes (insulin resistance).  This condition is treated by making diet and lifestyle changes and taking diabetes medicines or insulin.  Your health care provider will set individualized treatment goals for you. Your goals will be based on your age, other medical conditions you have, and how you respond to diabetes treatment.  Keep all follow-up visits as told by your health care provider. This is important. This information is not intended to replace advice given to you by your health care provider. Make sure you discuss any questions you have with your health care provider. Document Released: 01/23/2005 Document Revised: 08/24/2016 Document Reviewed: 02/26/2015 [employer]vier Interactive Patient Education  2019 [employer]  

## 2018-02-14 NOTE — Progress Notes (Signed)
Subjective:  Patient ID: Michelle Lopez, female    DOB: [birth year]  Age: 73 y.o. MRN: [id number]  CC: Hypertension and Diabetes   HPI Shaunita Seney presents for f/up - She complains of weight gain and polydipsia.  She does not monitor her blood pressure or her blood sugar.  She is with a caregiver and neither 1 can tell me whether or not she is compliant with her oral hypoglycemic agents.  She has had a good appetite and denies any recent episodes of headache, blurred vision, CP, DOE, palpitations, edema, or fatigue.  Outpatient Medications Prior to Visit  Medication Sig Dispense Refill  . amLODipine (NORVASC) 5 MG tablet TAKE 1 TABLET BY MOUTH EVERY DAY 90 tablet 1  . aspirin 81 MG tablet Take 81 mg by mouth daily.      Marland Kitchen atorvastatin (LIPITOR) 20 MG tablet TAKE 1 TABLET BY MOUTH EVERY DAY 90 tablet 1  . cetirizine (ZYRTEC) 10 MG tablet Take 1 tablet (10 mg total) by mouth daily. 90 tablet 3  . clopidogrel (PLAVIX) 75 MG tablet TAKE 1 TABLET BY MOUTH EVERY DAY 90 tablet 1  . eszopiclone (LUNESTA) 2 MG TABS tablet Take 1 tablet (2 mg total) by mouth at bedtime as needed for sleep. Take immediately before bedtime 30 tablet 5  . feeding supplement, ENSURE COMPLETE, (ENSURE COMPLETE) LIQD Take 237 mLs by mouth 2 (two) times daily between meals. 60 Bottle 5  . Lancets (ONETOUCH ULTRASOFT) lancets Use to check blood sugars twice a day 300 each 1  . losartan-hydrochlorothiazide (HYZAAR) 100-12.5 MG tablet Take 1 tablet by mouth daily. 90 tablet 1  . metoprolol tartrate (LOPRESSOR) 25 MG tablet Take 0.5 tablets (12.5 mg total) by mouth 2 (two) times daily. 180 tablet 1  . tamsulosin (FLOMAX) 0.4 MG CAPS capsule Take 1 capsule (0.4 mg total) by mouth daily. 30 capsule 0  . Vitamin D, Ergocalciferol, (DRISDOL) 50000 units CAPS capsule TAKE 1 CAPSULE BY MOUTH EVERY 7 DAYS 12 capsule 1  . Blood Glucose Monitoring Suppl (ONE TOUCH BASIC SYSTEM) W/DEVICE KIT Test up to TID dx: 250.42 1 each 1  . glucose  blood (ONETOUCH VERIO) test strip Use to check blood sugars twice a day 300 each 1  . SitaGLIPtin-MetFORMIN HCl (JANUMET XR) 50-1000 MG TB24 Take 1 tablet by mouth daily. 90 tablet 1   No facility-administered medications prior to visit.     ROS Review of Systems  Constitutional: Positive for unexpected weight change. Negative for appetite change, fatigue and fever.  HENT: Negative.  Negative for trouble swallowing.   Eyes: Negative for visual disturbance.  Respiratory: Negative for cough, chest tightness, shortness of breath and wheezing.   Cardiovascular: Negative for chest pain, palpitations and leg swelling.  Gastrointestinal: Negative for abdominal pain, diarrhea, nausea and vomiting.  Endocrine: Positive for polydipsia and polyphagia. Negative for polyuria.  Genitourinary: Negative.  Negative for difficulty urinating and dysuria.  Musculoskeletal: Positive for gait problem. Negative for arthralgias and myalgias.  Skin: Negative.  Negative for color change and pallor.  Neurological: Positive for dizziness, weakness and numbness. Negative for tremors, syncope, light-headedness and headaches.  Hematological: Negative for adenopathy. Does not bruise/bleed easily.  Psychiatric/Behavioral: Negative.     Objective:  BP (!) 124/56 (BP Location: Left Arm, Patient Position: Sitting, Cuff Size: Normal)   Pulse 73   Temp 98.5 F (36.9 C) (Oral)   Resp 16   Ht 5' (1.524 m)   Wt 145 lb 4 oz (65.9 kg)  SpO2 93%   BMI 28.37 kg/m   BP Readings from Last 3 Encounters:  02/14/18 (!) 124/56  10/17/17 (!) 150/80  08/23/17 (!) 158/68    Wt Readings from Last 3 Encounters:  02/14/18 145 lb 4 oz (65.9 kg)  10/17/17 120 lb (54.4 kg)  08/23/17 128 lb (58.1 kg)    Physical Exam Vitals signs reviewed.  Constitutional:      Appearance: Normal appearance.  HENT:     Nose: No congestion.     Mouth/Throat:     Mouth: Mucous membranes are moist.     Pharynx: No oropharyngeal exudate or  posterior oropharyngeal erythema.  Eyes:     General: No scleral icterus.    Conjunctiva/sclera: Conjunctivae normal.  Neck:     Musculoskeletal: Normal range of motion and neck supple.  Cardiovascular:     Rate and Rhythm: Normal rate and regular rhythm.     Pulses: Normal pulses.     Heart sounds: No murmur. No gallop.   Pulmonary:     Effort: Pulmonary effort is normal.     Breath sounds: Normal breath sounds. No wheezing, rhonchi or rales.  Abdominal:     General: Bowel sounds are normal.     Palpations: There is no hepatomegaly, splenomegaly or mass.     Tenderness: There is no abdominal tenderness. There is no guarding.  Lymphadenopathy:     Cervical: No cervical adenopathy.  Skin:    General: Skin is warm and dry.  Neurological:     Mental Status: She is alert.     Motor: Weakness present.     Coordination: Coordination abnormal.     Gait: Gait abnormal.     Deep Tendon Reflexes: Reflexes abnormal.  Psychiatric:        Mood and Affect: Mood normal.        Behavior: Behavior normal.        Thought Content: Thought content normal.        Judgment: Judgment normal.     Lab Results  Component Value Date   WBC 7.5 02/14/2018   HGB 13.0 02/14/2018   HCT 39.9 02/14/2018   PLT 256.0 02/14/2018   GLUCOSE 284 (H) 02/14/2018   CHOL 162 08/23/2017   TRIG 137.0 08/23/2017   HDL 39.70 08/23/2017   LDLDIRECT 100.0 07/06/2015   LDLCALC 95 08/23/2017   ALT 8 08/23/2017   AST 9 08/23/2017   NA 136 02/14/2018   K 3.7 02/14/2018   CL 97 02/14/2018   CREATININE 1.48 (H) 02/14/2018   BUN 29 (H) 02/14/2018   CO2 28 02/14/2018   TSH 2.04 08/23/2017   INR 1.1 (H) 02/25/2015   HGBA1C 9.3 (H) 02/14/2018   MICROALBUR 2.7 (H) 08/23/2017    Dg Hips Bilat With Pelvis Min 5 Views  Result Date: 04/03/2017 CLINICAL DATA:  Bilateral hip pain with walking for the last week, no known injury EXAM: DG HIP (WITH OR WITHOUT PELVIS) 5+V BILAT COMPARISON:  None. FINDINGS: Left total hip  replacement is noted. The acetabular and femoral components appear to be in good position with no complicating features. The femoral stem appears well positioned with no evidence of fracture. There are moderate D degenerative joint disease changes involving the right hip with loss of joint space and sclerosis with spurring and subchondral cyst formation. However no acute fracture is seen. The pelvic rami are intact. The SI joints appear corticated. IMPRESSION: 1. 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 2.  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 No acute abnormality. Electronically Signed   By: Ivar Drape M.D.   On: 04/03/2017 16:45    Assessment & Plan:   Aedyn was seen today for hypertension and diabetes.  Diagnoses and all orders for this visit:  B12 deficiency -     CBC with Differential/Platelet; Future -     cyanocobalamin ((VITAMIN B-12)) injection 1,000 mcg  Type 2 diabetes mellitus with diabetic nephropathy, without long-term current use of insulin ([place])- Her A1c is up to 9.3%.  Her creatinine clearance is down to 27.8 so I have asked her to stop taking metformin but will continue a DPP 4 inhibitor at a renal dose and I will also add on a basal insulin.  Additionally, I have asked her to be seen by diabetic education and endocrinology to consider additional treatment options. -     Hemoglobin A1c; Future -     Basic metabolic panel; Future -     Blood Glucose Monitoring Suppl (CONTOUR NEXT EZ) w/Device KIT; 1 Act by Does not apply route 2 (two) times daily. -     Discontinue: glucose blood test strip; Use BID -     Amb Referral to Nutrition and Diabetic E -     Ambulatory referral to Endocrinology -     Consult to [place] [facility] -     Insulin Glargine, 2 Unit Dial, (TOUJEO MAX SOLOSTAR) 300 UNIT/ML SOPN; Inject 20 Units into the skin daily. -     SitaGLIPtin-MetFORMIN HCl (JANUMET XR) 50-1000 MG TB24; Take 1 tablet by  mouth daily.  Essential hypertension, benign- Her blood pressure is adequately well controlled. -     Basic metabolic panel; Future  Need for hepatitis C screening test -     Hepatitis C antibody; Future  Protein-calorie malnutrition, mild ([place])- She has gained weight since I last saw her.  CRI (chronic renal insufficiency), stage 4 (severe) ([place])- I have asked her to see nephrology to consider diagnostic and treatment options for this. -     Ambulatory referral to Nephrology  Type 2 diabetes mellitus with diabetic nephropathy, with long-term current use of insulin ([facility]) -     SitaGLIPtin-MetFORMIN HCl (JANUMET XR) 50-1000 MG TB24; Take 1 tablet by mouth daily.  Cerebrovascular accident (CVA) due to thrombosis of precerebral artery ([place])- There are no changes in her neurological status compared to her prior visit.  Other orders -     glucose blood (ONETOUCH VERIO) test strip; Use to check blood sugars twice a day   I have discontinued Tammi Klippel ONE TOUCH BASIC SYSTEM, SitaGLIPtin-MetFORMIN HCl, and SitaGLIPtin-MetFORMIN HCl. I am also having her start on CONTOUR NEXT EZ, Insulin Glargine (2 Unit Dial), and sitaGLIPtin. Additionally, I am having her maintain her aspirin, onetouch ultrasoft, tamsulosin, losartan-hydrochlorothiazide, Vitamin D (Ergocalciferol), metoprolol tartrate, amLODipine, clopidogrel, feeding supplement (ENSURE COMPLETE), eszopiclone, atorvastatin, cetirizine, and glucose blood. We administered cyanocobalamin.  Meds ordered this encounter  Medications  . Blood Glucose Monitoring Suppl (CONTOUR NEXT EZ) w/Device KIT    Sig: 1 Act by Does not apply route 2 (two) times daily.    Dispense:  1 kit    Refill:  0  . DISCONTD: glucose blood test strip    Sig: Use BID    Dispense:  100 each    Refill:  3  . glucose blood (ONETOUCH VERIO) test strip    Sig: Use to check blood sugars twice a day    Dispense:  300 each    Refill:  1    Dx: E11.21  . cyanocobalamin  ((VITAMIN B-12)) injection 1,000 mcg  . Insulin Glargine, 2 Unit Dial, (TOUJEO MAX SOLOSTAR) 300 UNIT/ML SOPN    Sig: Inject 20 Units into the skin daily.    Dispense:  6 mL    Refill:  1  . DISCONTD: SitaGLIPtin-MetFORMIN HCl (JANUMET XR) 50-1000 MG TB24    Sig: Take 1 tablet by mouth daily.    Dispense:  90 tablet    Refill:  1  . sitaGLIPtin (JANUVIA) 50 MG tablet    Sig: Take 1 tablet (50 mg total) by mouth daily.    Dispense:  90 tablet    Refill:  1     Follow-up: Return in about 4 months (around 06/15/2018).  Scarlette Calico, MD

## 2018-02-15 ENCOUNTER — Other Ambulatory Visit: Payer: Self-pay

## 2018-02-15 DIAGNOSIS — N184 Chronic kidney disease, stage 4 (severe): Secondary | ICD-10-CM | POA: Insufficient documentation

## 2018-02-15 LAB — HEPATITIS C ANTIBODY
Hepatitis C Ab: NONREACTIVE
SIGNAL TO CUT-OFF: 0.03 (ref <1.00)

## 2018-02-15 NOTE — Patient Outreach (Signed)
[place] [facility]) Care Management  02/15/2018  Michelle Lopez 03/20/1945 [id number]   Referral Date: 02/15/2018 Referral Source: MD referral Referral Reason: Diabetes education and support   Outreach Attempt: spoke with patient. She is able to verify HIPAA.  Discussed reason for referral.  Patient is agreeable to referral.  Patient states that she is at the beauty shop and it is noisy in there and could CM call back on Monday.  Advised patient that CM would call another time.  She verbalized understanding.   Plan: RN CM will attempt patient again within 4 business days.     Jone Baseman, RN, MSN [facility] Care Management Care Management Coordinator Direct Line [phone] Toll Free: [phone]  Fax: [phone]

## 2018-02-18 ENCOUNTER — Other Ambulatory Visit: Payer: Self-pay

## 2018-02-18 MED ORDER — INSULIN GLARGINE (2 UNIT DIAL) 300 UNIT/ML ~~LOC~~ SOPN: 20.0000 [IU] | PEN_INJECTOR | Freq: Every day | SUBCUTANEOUS | 1 refills | Status: DC

## 2018-02-18 MED ORDER — SITAGLIP PHOS-METFORMIN HCL ER 50-1000 MG PO TB24: 1.0000 | ORAL_TABLET | Freq: Every day | ORAL | 1 refills | Status: DC

## 2018-02-18 MED ORDER — SITAGLIPTIN PHOSPHATE 50 MG PO TABS: 50.0000 mg | ORAL_TABLET | Freq: Every day | ORAL | 1 refills | Status: DC

## 2018-02-18 NOTE — Patient Outreach (Signed)
[place] [facility]) Care Management  02/18/2018  Michelle Lopez 03-21-1945 [id number]   Referral Date: 02/15/2018 Referral Source: MD referral Referral Reason: Diabetes education and support   Outreach Attempt: spoke with daughter Michelle Lopez.  She states she is going to her mom's house right now and will call CM back once she is over there.   Plan: RN CM will wait return call from patient/daughter.  If no return call will attempt again within 4 business days.    Jone Baseman, RN, MSN [facility] Care Management Care Management Coordinator Direct Line [phone] Toll Free: [phone]  Fax: [phone]

## 2018-02-20 ENCOUNTER — Ambulatory Visit: Payer: Self-pay | Admitting: *Deleted

## 2018-02-20 ENCOUNTER — Other Ambulatory Visit: Payer: Self-pay

## 2018-02-20 NOTE — Telephone Encounter (Signed)
Spoke with [name] (pt dtr) and informed/confirmed that the device is a glucometer and pt would need to prick her finger and place a drop of blood on the test strip. [name] stated understanding and stated that the pt has done that before.

## 2018-02-20 NOTE — Telephone Encounter (Signed)
Contacted pt's daughter, Michelle Lopez, regarding questions about how to use glucose meter; Michelle Lopez says that she is not presently with her mother, but she is on the way there; she says that the pt or caregiver know how to use the sample meter that was given to her; spoke with Sam and she confirms that this is a sample received from the office; she requests that this encounter be routed to the office so that Dr Adah Salvage nurse can give them instructions over the phone; if this does not work, a nurse visit can be scheduled; relayed information to pt's daughter and she verbalized understanding; Michelle Lopez can be contacted at [phone]; pt last seen by Dr Scarlette Calico, [facility], 02/14/2018; will route per request.   Reason for Disposition . Caller requesting a NON-URGENT new prescription or refill and triager unable to refill per unit policy  Answer Assessment - Initial Assessment Questions 1. SYMPTOMS: "Do you have any symptoms?"     no 2. SEVERITY: If symptoms are present, ask "Are they mild, moderate or severe?"     n/a  Protocols used: MEDICATION QUESTION CALL-A-AH

## 2018-02-20 NOTE — Patient Outreach (Signed)
[place] [facility]) Care Management  02/20/2018  Lysa Livengood [birth year] [id number]   Referral Date:02/15/2018 Referral Source:MD referral Referral Reason:Diabetes education and support   Outreach Attempt: no answer.  HIPAA compliant voice message left.  Plan: RN CM will wait return call.  If no return call will close case.   Jone Baseman, RN, MSN [place] Management Care Management Coordinator Direct Line [phone] Cell [phone] Toll Free: [phone]  Fax: [phone]

## 2018-02-24 ENCOUNTER — Other Ambulatory Visit: Payer: Self-pay | Admitting: Internal Medicine

## 2018-03-01 ENCOUNTER — Other Ambulatory Visit: Payer: Self-pay

## 2018-03-01 NOTE — Patient Outreach (Signed)
[place] [facility]) Care Management  03/01/2018  Michelle Lopez [birth year] [id number]   Multiple attempts to establish contact with patient without success. No response from letter mailed to patient.  Telephone call to Dr. Ronnald Ramp office to notify of not being able to engage patient.  Message left with nurse.  Plan: RN CM will close case at this time.   Jone Baseman, RN, MSN [facility] Care Management Coordinator Direct Line [phone] Cell [phone] Toll Free: [phone]  Fax: [phone]

## 2018-03-05 ENCOUNTER — Encounter: Payer: Self-pay | Admitting: Internal Medicine

## 2018-03-16 ENCOUNTER — Other Ambulatory Visit: Payer: Self-pay | Admitting: Internal Medicine

## 2018-03-18 ENCOUNTER — Ambulatory Visit: Payer: Self-pay

## 2018-03-18 ENCOUNTER — Emergency Department (HOSPITAL_COMMUNITY)
Admission: EM | Admit: 2018-03-18 | Discharge: 2018-03-18 | Disposition: A | Discharge status: Home / Self Care | Payer: Medicare Other | Attending: Emergency Medicine | Admitting: Emergency Medicine

## 2018-03-18 ENCOUNTER — Other Ambulatory Visit: Payer: Self-pay | Admitting: Internal Medicine

## 2018-03-18 ENCOUNTER — Encounter (HOSPITAL_COMMUNITY): Payer: Self-pay

## 2018-03-18 DIAGNOSIS — Z7982 Long term (current) use of aspirin: Secondary | ICD-10-CM | POA: Diagnosis not present

## 2018-03-18 DIAGNOSIS — E1165 Type 2 diabetes mellitus with hyperglycemia: Secondary | ICD-10-CM | POA: Insufficient documentation

## 2018-03-18 DIAGNOSIS — I10 Essential (primary) hypertension: Secondary | ICD-10-CM | POA: Diagnosis not present

## 2018-03-18 DIAGNOSIS — Z96649 Presence of unspecified artificial hip joint: Secondary | ICD-10-CM | POA: Diagnosis not present

## 2018-03-18 DIAGNOSIS — Z794 Long term (current) use of insulin: Secondary | ICD-10-CM | POA: Diagnosis not present

## 2018-03-18 DIAGNOSIS — Z79899 Other long term (current) drug therapy: Secondary | ICD-10-CM | POA: Diagnosis not present

## 2018-03-18 DIAGNOSIS — R9431 Abnormal electrocardiogram [ECG] [EKG]: Secondary | ICD-10-CM | POA: Diagnosis not present

## 2018-03-18 DIAGNOSIS — R5383 Other fatigue: Secondary | ICD-10-CM | POA: Diagnosis not present

## 2018-03-18 DIAGNOSIS — F0390 Unspecified dementia without behavioral disturbance: Secondary | ICD-10-CM | POA: Insufficient documentation

## 2018-03-18 DIAGNOSIS — Z7902 Long term (current) use of antithrombotics/antiplatelets: Secondary | ICD-10-CM | POA: Diagnosis not present

## 2018-03-18 DIAGNOSIS — R739 Hyperglycemia, unspecified: Secondary | ICD-10-CM | POA: Diagnosis present

## 2018-03-18 LAB — TROPONIN I: Troponin I: <0.03 ng/mL (ref <0.03)

## 2018-03-18 LAB — URINALYSIS, ROUTINE W REFLEX MICROSCOPIC
Bacteria, UA: NONE SEEN
Bilirubin Urine: NEGATIVE
Glucose, UA: >=500 mg/dL — AB
Hgb urine dipstick: NEGATIVE
Ketones, ur: NEGATIVE mg/dL
Nitrite: NEGATIVE
Protein, ur: NEGATIVE mg/dL
SPECIFIC GRAVITY, URINE: 1.022 (ref 1.005–1.030)
pH: 5 (ref 5.0–8.0)

## 2018-03-18 LAB — BASIC METABOLIC PANEL
Anion gap: 9 (ref 5–15)
BUN: 24 mg/dL — ABNORMAL HIGH (ref 8–23)
CO2: 26 mmol/L (ref 22–32)
Calcium: 9.1 mg/dL (ref 8.9–10.3)
Chloride: 99 mmol/L (ref 98–111)
Creatinine, Ser: 1.39 mg/dL — ABNORMAL HIGH (ref 0.44–1.00)
GFR calc Af Amer: 43 mL/min — ABNORMAL LOW (ref >60)
GFR calc non Af Amer: 37 mL/min — ABNORMAL LOW (ref >60)
Glucose, Bld: 355 mg/dL — ABNORMAL HIGH (ref 70–99)
POTASSIUM: 4.1 mmol/L (ref 3.5–5.1)
Sodium: 134 mmol/L — ABNORMAL LOW (ref 135–145)

## 2018-03-18 LAB — CBC
HCT: 39.8 % (ref 36.0–46.0)
Hemoglobin: 12.3 g/dL (ref 12.0–15.0)
MCH: 25.9 pg — ABNORMAL LOW (ref 26.0–34.0)
MCHC: 30.9 g/dL (ref 30.0–36.0)
MCV: 84 fL (ref 80.0–100.0)
Platelets: 278 10*3/uL (ref 150–400)
RBC: 4.74 MIL/uL (ref 3.87–5.11)
RDW: 12.3 % (ref 11.5–15.5)
WBC: 7 10*3/uL (ref 4.0–10.5)
nRBC: 0 % (ref 0.0–0.2)

## 2018-03-18 LAB — CBG MONITORING, ED
Glucose-Capillary: 357 mg/dL — ABNORMAL HIGH (ref 70–99)
Glucose-Capillary: 298 mg/dL — ABNORMAL HIGH (ref 70–99)

## 2018-03-18 MED ORDER — SODIUM CHLORIDE 0.9 % IV BOLUS
1000.0000 mL | Freq: Once | INTRAVENOUS | Status: AC
  Administered 2018-03-18: 1000 mL via INTRAVENOUS

## 2018-03-18 NOTE — ED Provider Notes (Signed)
[facility] DEPT Provider Note   CSN: [id number] Arrival date & time: 03/18/18  1445     History   Chief Complaint Chief Complaint  Patient presents with  . Hyperglycemia    HPI Michelle Lopez is a 73 y.o. female.  HPI  73 year old female presents with fatigue/weakness.  Started when she first woke up this morning.  Home health aide checked her glucose and was over 400.  She endorses compliance with her meds.  The patient's been chronically feeling dizzy and this is unchanged today.  There is no chest pain, headache, vomiting, shortness of breath or focal weakness.  She felt a little nauseated today.  The fatigue has continued and so she was brought in here for evaluation. No urinary symptoms.  Past Medical History:  Diagnosis Date  . Cerebrovascular disease, unspecified   . Cocaine abuse, unspecified   . Diabetes mellitus    type 2, uncontrolled  . Diarrhea   . Essential hypertension, benign   . Hyperlipidemia   . Stroke [facility])    x7    Patient Active Problem List   Diagnosis Date Noted  . CRI (chronic renal insufficiency), stage 4 (severe) ([place]) 02/15/2018  . Need for hepatitis C screening test 02/14/2018  . Protein-calorie malnutrition, mild ([place]) 10/17/2017  . B12 deficiency 08/23/2017  . Primary osteoarthritis of right hip 04/03/2017  . PAD (peripheral artery disease) ([place]) 07/24/2016  . Vitamin D deficiency 07/06/2015  . Osteopenia, senile 03/03/2014  . 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 Seborrheic dermatitis 03/05/2013  . Dysphagia as late effect of stroke 05/30/2011  . Dementia arising in the senium and presenium ([place]) 05/02/2011  . Routine general medical examination at a health care facility 05/02/2011  . Hyperlipidemia with target LDL less than 70   . Visit for screening mammogram 09/15/2010  . DEPRESSION 06/24/2009  . VERTIGO, CENTRAL 01/14/2009  . Insomnia 01/14/2009  . CVA (cerebral vascular  accident) ([place]) 06/11/2008  . DM (diabetes mellitus), type 2 with renal complications ([place]) 44/96/7591  . Essential hypertension, benign 06/05/2007    Past Surgical History:  Procedure Laterality Date  . TOTAL HIP ARTHROPLASTY  1970   Dr. Rodell Perna     OB History   No obstetric history on file.      Home Medications    Prior to Admission medications   Medication Sig Start Date End Date Taking? Authorizing Provider  amLODipine (NORVASC) 5 MG tablet TAKE 1 TABLET BY MOUTH EVERY DAY 10/13/17  Yes Janith Lima, MD  aspirin 81 MG tablet Take 81 mg by mouth daily.     Yes [provider]  atorvastatin (LIPITOR) 20 MG tablet TAKE 1 TABLET BY MOUTH EVERY DAY Patient taking differently: Take 20 mg by mouth daily.  10/23/17  Yes Janith Lima, MD  clopidogrel (PLAVIX) 75 MG tablet TAKE 1 TABLET BY MOUTH EVERY DAY Patient taking differently: Take 75 mg by mouth daily.  10/13/17  Yes Janith Lima, MD  Doxepin HCl (SILENOR) 6 MG TABS Take 6 mg by mouth at bedtime as needed (sleep).   Yes [provider]  losartan-hydrochlorothiazide (HYZAAR) 100-12.5 MG tablet Take 1 tablet by mouth daily. 08/28/17  Yes Janith Lima, MD  metoprolol tartrate (LOPRESSOR) 25 MG tablet Take 0.5 tablets (12.5 mg total) by mouth 2 (two) times daily. 10/01/17  Yes Janith Lima, MD  sitaGLIPtin-metformin (JANUMET) 50-1000 MG tablet Take 1 tablet by mouth daily.   Yes  [provider]  tamsulosin (FLOMAX) 0.4 MG CAPS capsule TAKE 1 CAPSULE (0.4 MG TOTAL) BY MOUTH DAILY. 02/25/18  Yes Janith Lima, MD  Vitamin D, Ergocalciferol, (DRISDOL) 50000 units CAPS capsule TAKE 1 CAPSULE BY MOUTH EVERY 7 DAYS Patient taking differently: TAKE 1 CAPSULE BY MOUTH EVERY 7 DAYS 09/16/17  Yes Janith Lima, MD  Blood Glucose Monitoring Suppl (CONTOUR NEXT EZ) w/Device KIT 1 Act by Does not apply route 2 (two) times daily. 02/14/18   Janith Lima, MD  cetirizine (ZYRTEC) 10 MG tablet Take 1 tablet  (10 mg total) by mouth daily. Patient not taking: Reported on 03/18/2018 11/09/17   Janith Lima, MD  eszopiclone (LUNESTA) 2 MG TABS tablet Take 1 tablet (2 mg total) by mouth at bedtime as needed for sleep. Take immediately before bedtime Patient not taking: Reported on 03/18/2018 10/17/17   Janith Lima, MD  feeding supplement, ENSURE COMPLETE, (ENSURE COMPLETE) LIQD Take 237 mLs by mouth 2 (two) times daily between meals. Patient not taking: Reported on 03/18/2018 10/17/17   Janith Lima, MD  glucose blood [facility] VERIO) test strip Use to check blood sugars twice a day 02/14/18   Janith Lima, MD  Insulin Glargine, 2 Unit Dial, (TOUJEO MAX SOLOSTAR) 300 UNIT/ML SOPN Inject 20 Units into the skin daily. Patient not taking: Reported on 03/18/2018 02/18/18   Janith Lima, MD  Lancets [facility] ULTRASOFT) lancets Use to check blood sugars twice a day 08/25/16   Janith Lima, MD  metFORMIN (GLUCOPHAGE) 1000 MG tablet TAKE 1 TABLET BY MOUTH TWICE DAILY WITH A MEAL Patient not taking: Reported on 03/18/2018 03/17/18   Janith Lima, MD  sitaGLIPtin (JANUVIA) 50 MG tablet Take 1 tablet (50 mg total) by mouth daily. Patient not taking: Reported on 03/18/2018 02/18/18   Janith Lima, MD    Family History Family History  Problem Relation Age of Onset  . Colon cancer Neg Hx     Social History Social History   Tobacco Use  . Smoking status: Never Smoker  . Smokeless tobacco: Never Used  Substance Use Topics  . Alcohol use: No    Alcohol/week: 0.0 standard drinks  . Drug use: No     Allergies   Crestor [rosuvastatin calcium]; Morphine; Pravastatin sodium; and Simvastatin   Review of Systems Review of Systems  Constitutional: Positive for fatigue. Negative for fever.  Respiratory: Negative for shortness of breath.   Cardiovascular: Negative for chest pain.  Gastrointestinal: Positive for nausea. Negative for abdominal pain and vomiting.  Genitourinary: Negative for dysuria.   Neurological: Positive for dizziness (chronic) and weakness. Negative for numbness and headaches.  All other systems reviewed and are negative.    Physical Exam Updated Vital Signs BP 130/62   Pulse 78   Temp 98.9 F (37.2 C) (Oral)   Resp (!) 21   SpO2 100%   Physical Exam Vitals signs and nursing note reviewed.  Constitutional:      General: She is not in acute distress.    Appearance: She is well-developed. She is not ill-appearing.  HENT:     Head: Normocephalic and atraumatic.     Right Ear: External ear normal.     Left Ear: External ear normal.     Nose: Nose normal.  Eyes:     General:        Right eye: No discharge.        Left eye: No discharge.  Extraocular Movements: Extraocular movements intact.     Pupils: Pupils are equal, round, and reactive to light.  Cardiovascular:     Rate and Rhythm: Normal rate and regular rhythm.     Heart sounds: Normal heart sounds.  Pulmonary:     Effort: Pulmonary effort is normal.     Breath sounds: Normal breath sounds.  Abdominal:     Palpations: Abdomen is soft.     Tenderness: There is no abdominal tenderness.  Skin:    General: Skin is warm and dry.  Neurological:     Mental Status: She is alert.     Comments: CN 3-12 grossly intact. 5/5 strength in all 4 extremities, but slightly weaker in LUE, LLE (chronic from prior stroke). Grossly normal sensation. Normal finger to nose on right, difficulty on left.   Psychiatric:        Mood and Affect: Mood is not anxious.      ED Treatments / Results  Labs (all labs ordered are listed, but only abnormal results are displayed) Labs Reviewed  BASIC METABOLIC PANEL - Abnormal; Notable for the following components:      Result Value   Sodium 134 (*)    Glucose, Bld 355 (*)    BUN 24 (*)    Creatinine, Ser 1.39 (*)    GFR calc non Af Amer 37 (*)    GFR calc Af Amer 43 (*)    All other components within normal limits  CBC - Abnormal; Notable for the following  components:   MCH 25.9 (*)    All other components within normal limits  URINALYSIS, ROUTINE W REFLEX MICROSCOPIC - Abnormal; Notable for the following components:   Glucose, UA >=500 (*)    Leukocytes, UA TRACE (*)    All other components within normal limits  CBG MONITORING, ED - Abnormal; Notable for the following components:   Glucose-Capillary 357 (*)    All other components within normal limits  CBG MONITORING, ED - Abnormal; Notable for the following components:   Glucose-Capillary 298 (*)    All other components within normal limits  TROPONIN I    EKG EKG Interpretation  Date/Time:  Monday March 18 2018 19:11:48 EST Ventricular Rate:  77 PR Interval:    QRS Duration: 92 QT Interval:  360 QTC Calculation: 408 R Axis:   -10 Text Interpretation:  Sinus rhythm Nonspecific T abnormalities, lateral leads T wave changes similar to priors Confirmed by Sherwood Gambler [phone]) on 03/18/2018 7:48:08 PM   Radiology No results found.  Procedures Procedures (including critical care time)  Medications Ordered in ED Medications  sodium chloride 0.9 % bolus 1,000 mL (0 mLs Intravenous Stopped 03/18/18 2030)     Initial Impression / Assessment and Plan / ED Course  I have reviewed the triage vital signs and the nursing notes.  Pertinent labs & imaging results that were available during my care of the patient were reviewed by me and considered in my medical decision making (see chart for details).     Patient presents with nonspecific fatigue/dizziness.  The dizziness is a longstanding problem but the fatigue is new today in association with hyperglycemia.  There is no acidosis.  Likely she is dehydrated and does feel better with IV fluid bolus.  She has no specific chest pain, but given age and weakness, ECG and troponin obtained.  ECG shows nonspecific T waves but she has had these before.  Troponin is negative.  I think overall ACS is less likely  a cause and she can be  discharged home to follow-up with her PCP for outpatient medication adjustment.  Return precautions.  Final Clinical Impressions(s) / ED Diagnoses   Final diagnoses:  Hyperglycemia    ED Discharge Orders    None       Sherwood Gambler, MD 03/18/18 2324

## 2018-03-18 NOTE — Telephone Encounter (Signed)
Incoming call from Patient and   Patients' daughter' Who is on the  DPR,  Patient blood sugar was recently  checked and it was 419.  Patient  Also complains of  Blurred vision also.  Blood sugar Sx.  Started today.    Denies  Checking ketones.  Daughter states that Patient is  Type 2 diabetic.  Denies taking  Insulin.  Only on  Jamuet.  Has not  Missed any  Doses.  State" Patient had Pancakes with plenty of syrup": for  Breakfast.  Reviewed protocol with  Patient recommended Patient  Go to ED for for  Further   Evaluation.Patient and   Daughter voiced understanding.  Daughter states she is  Unable to take her Mother   Right now.  Will contact her other Sisters and have them  take Her   Mother to [facility] ED.       Reason for Disposition . [1] Blood glucose > 240 mg/dL (13.3 mmol/L) AND [2] rapid breathing  Answer Assessment - Initial Assessment Questions 1. BLOOD GLUCOSE: "What is your blood glucose level?"        419 2. ONSET: "When did you check the blood glucose?"    now 3. USUAL RANGE: "What is your glucose level usually?" (e.g., usual fasting morning value, usual evening value)      unknown 4. KETONES: "Do you check for ketones (urine or blood test strips)?" If yes, ask: "What does the test show now?"     5. TYPE 1 or 2:  "Do you know what type of diabetes you have?"  (e.g., Type 1, Type 2, Gestational; doesn't know)      Type 2 6. INSULIN: "Do you take insulin?" "What type of insulin(s) do you use? What is the mode of delivery? (syringe, pen (e.g., injection or  pump)?"      denies 7. DIABETES PILLS: "Do you take any pills for your diabetes?" If yes, ask: "Have you missed taking any pills recently?"   Jamuet 8. OTHER SYMPTOMS: "Do you have any symptoms?" (e.g., fever, frequent urination, difficulty breathing, dizziness, weakness, vomiting)      Constipated, dizzy 9. PREGNANCY: "Is there any chance you are pregnant?" "When was your last menstrual period?"     denies  Protocols used:  DIABETES - HIGH BLOOD SUGAR-A-AH

## 2018-03-18 NOTE — ED Notes (Signed)
Previous Troponin timed out, another to be drawn and sent.

## 2018-03-18 NOTE — ED Triage Notes (Signed)
Patient c/o "not feeling well".   Home Care Nurse took CBG-415.   Called MD and told to take patient to ED.   Patient C/O dizziness and nausea.   A/Ox4  Wheelchair in triage.

## 2018-03-21 ENCOUNTER — Inpatient Hospital Stay: Payer: Medicare Other | Admitting: Internal Medicine

## 2018-03-25 ENCOUNTER — Other Ambulatory Visit: Payer: Self-pay | Admitting: Internal Medicine

## 2018-03-25 NOTE — Telephone Encounter (Signed)
Copied from [place] [id number]. Topic: Quick Communication - Rx Refill/Question >> Mar 25, 2018 10:58 AM Bea Graff, NT wrote: Medication: Doxepin HCl (SILENOR) 6 MG TABS   Has the patient contacted their pharmacy? Yes.   (Agent: If no, request that the patient contact the pharmacy for the refill.) (Agent: If yes, when and what did the pharmacy advise?)  Preferred Pharmacy (with phone number or street name): [id number] - [place], [place] [phone] (Phone) [phone] (Fax)    Agent: Please be advised that RX refills may take up to 3 business days. We ask that you follow-up with your pharmacy.

## 2018-03-25 NOTE — Telephone Encounter (Signed)
Requested medication (s) are due for refill today: yes  Requested medication (s) are on the active medication list: yes  Last refill:  Last refilled by historical provider  Future visit scheduled: yes  Notes to clinic:  Unable to refill per protocol. Last refilled by historical provider.     Requested Prescriptions  Pending Prescriptions Disp Refills   Doxepin HCl (SILENOR) 6 MG TABS 30 tablet     Sig: Take 1 tablet (6 mg total) by mouth at bedtime as needed (sleep).     Psychiatry:  Antidepressants - Heterocyclics (TCAs) Failed - 03/25/2018 12:11 PM      Failed - Completed PHQ-2 or PHQ-9 in the last 360 days.      Passed - Valid encounter within last 6 months    Recent Outpatient Visits          1 month ago B12 deficiency   [facility] -Mayer Camel, MD   5 months ago Essential hypertension, benign   [place], MD   7 months ago Type 2 diabetes mellitus with diabetic nephropathy, with long-term current use of insulin [facility])   [place], MD   11 months ago Hip pain, bilateral   [place], Thomas L, MD   1 year ago Essential hypertension, benign   [place], MD      Future Appointments            In 3 weeks Janith Lima, MD [place], [facility]

## 2018-03-26 MED ORDER — DOXEPIN HCL 6 MG PO TABS: 6.0000 mg | ORAL_TABLET | Freq: Every evening | ORAL | 1 refills | Status: DC | PRN

## 2018-04-10 ENCOUNTER — Other Ambulatory Visit: Payer: Self-pay | Admitting: Internal Medicine

## 2018-04-10 DIAGNOSIS — E1121 Type 2 diabetes mellitus with diabetic nephropathy: Secondary | ICD-10-CM

## 2018-04-10 DIAGNOSIS — I10 Essential (primary) hypertension: Secondary | ICD-10-CM

## 2018-04-15 ENCOUNTER — Other Ambulatory Visit (INDEPENDENT_AMBULATORY_CARE_PROVIDER_SITE_OTHER): Payer: Medicare Other

## 2018-04-15 ENCOUNTER — Telehealth: Payer: Self-pay | Admitting: Internal Medicine

## 2018-04-15 ENCOUNTER — Encounter: Payer: Self-pay | Admitting: Internal Medicine

## 2018-04-15 ENCOUNTER — Other Ambulatory Visit: Payer: Self-pay

## 2018-04-15 ENCOUNTER — Ambulatory Visit (INDEPENDENT_AMBULATORY_CARE_PROVIDER_SITE_OTHER): Payer: Medicare Other | Admitting: Internal Medicine

## 2018-04-15 VITALS — BP 128/60 | HR 76 | Temp 98.5°F | Resp 16 | Ht 60.0 in | Wt 139.0 lb

## 2018-04-15 DIAGNOSIS — E559 Vitamin D deficiency, unspecified: Secondary | ICD-10-CM

## 2018-04-15 DIAGNOSIS — M8580 Other specified disorders of bone density and structure, unspecified site: Secondary | ICD-10-CM | POA: Diagnosis not present

## 2018-04-15 DIAGNOSIS — F5104 Psychophysiologic insomnia: Secondary | ICD-10-CM | POA: Diagnosis not present

## 2018-04-15 DIAGNOSIS — I6300 Cerebral infarction due to thrombosis of unspecified precerebral artery: Secondary | ICD-10-CM

## 2018-04-15 DIAGNOSIS — N184 Chronic kidney disease, stage 4 (severe): Secondary | ICD-10-CM

## 2018-04-15 DIAGNOSIS — E1121 Type 2 diabetes mellitus with diabetic nephropathy: Secondary | ICD-10-CM

## 2018-04-15 LAB — POCT GLUCOSE (DEVICE FOR HOME USE): Glucose Fasting, POC: 242 mg/dL — AB (ref 70–99)

## 2018-04-15 LAB — POCT GLYCOSYLATED HEMOGLOBIN (HGB A1C): HEMOGLOBIN A1C: 9.8 % — AB (ref 4.0–5.6)

## 2018-04-15 LAB — VITAMIN D 25 HYDROXY (VIT D DEFICIENCY, FRACTURES): VITD: 90.21 ng/mL (ref 30.00–100.00)

## 2018-04-15 MED ORDER — SITAGLIP PHOS-METFORMIN HCL ER 50-1000 MG PO TB24: 1.0000 | ORAL_TABLET | Freq: Every day | ORAL | 1 refills | Status: DC

## 2018-04-15 MED ORDER — INSULIN GLARGINE (2 UNIT DIAL) 300 UNIT/ML ~~LOC~~ SOPN: 30.0000 [IU] | PEN_INJECTOR | Freq: Every day | SUBCUTANEOUS | 1 refills | Status: DC

## 2018-04-15 MED ORDER — VITAMIN D (ERGOCALCIFEROL) 1.25 MG (50000 UNIT) PO CAPS: 50000.0000 [IU] | ORAL_CAPSULE | ORAL | 0 refills | Status: DC

## 2018-04-15 MED ORDER — SUVOREXANT 10 MG PO TABS: 1.0000 | ORAL_TABLET | Freq: Every day | ORAL | 5 refills | Status: DC

## 2018-04-15 NOTE — Progress Notes (Signed)
Subjective:  Patient ID: Michelle Lopez, female    DOB: [birth year]  Age: 73 y.o. MRN: [id number]  CC: Hypertension and Diabetes   HPI Michelle Lopez presents for f/up - She remains concerned that her blood sugar is not well controlled.  She did a random blood sugar 1 day prior to this visit and it was up to 187.  She is with her daughter today who is not her primary healthcare provider.  Neither the patient nor the daughter can confirm which medicines she is or is not taking.  They tell me that she has severe insomnia and Silenor has not helped and Lunesta was too expensive.  They do not think she is currently using insulin or Janumet.  The patient and the daughter do not know if she is taking losartan or hydrochlorothiazide.  The only complaints offered by the patient today are chronic, unchanged dizziness, insomnia, and ataxia.  Outpatient Medications Prior to Visit  Medication Sig Dispense Refill  . amLODipine (NORVASC) 5 MG tablet TAKE 1 TABLET BY MOUTH EVERY DAY 90 tablet 1  . aspirin 81 MG tablet Take 81 mg by mouth daily.      Marland Kitchen atorvastatin (LIPITOR) 20 MG tablet TAKE 1 TABLET BY MOUTH EVERY DAY (Patient taking differently: Take 20 mg by mouth daily. ) 90 tablet 1  . Blood Glucose Monitoring Suppl (CONTOUR NEXT EZ) w/Device KIT 1 Act by Does not apply route 2 (two) times daily. 1 kit 0  . cetirizine (ZYRTEC) 10 MG tablet Take 1 tablet (10 mg total) by mouth daily. 90 tablet 3  . clopidogrel (PLAVIX) 75 MG tablet TAKE 1 TABLET BY MOUTH EVERY DAY (Patient taking differently: Take 75 mg by mouth daily. ) 90 tablet 1  . feeding supplement, ENSURE COMPLETE, (ENSURE COMPLETE) LIQD Take 237 mLs by mouth 2 (two) times daily between meals. 60 Bottle 5  . glucose blood (ONETOUCH VERIO) test strip Use to check blood sugars twice a day 300 each 1  . Lancets (ONETOUCH ULTRASOFT) lancets Use to check blood sugars twice a day 300 each 1  . metoprolol tartrate (LOPRESSOR) 25 MG tablet Take 0.5 tablets  (12.5 mg total) by mouth 2 (two) times daily. 180 tablet 1  . tamsulosin (FLOMAX) 0.4 MG CAPS capsule TAKE 1 CAPSULE (0.4 MG TOTAL) BY MOUTH DAILY. 90 capsule 1  . metFORMIN (GLUCOPHAGE) 1000 MG tablet TAKE 1 TABLET BY MOUTH TWICE DAILY WITH A MEAL 180 tablet 1  . sitaGLIPtin (JANUVIA) 50 MG tablet Take 1 tablet (50 mg total) by mouth daily. 90 tablet 1  . Vitamin D, Ergocalciferol, (DRISDOL) 50000 units CAPS capsule TAKE 1 CAPSULE BY MOUTH EVERY 7 DAYS (Patient taking differently: TAKE 1 CAPSULE BY MOUTH EVERY 7 DAYS) 12 capsule 1  . losartan-hydrochlorothiazide (HYZAAR) 100-12.5 MG tablet TAKE 1 TABLET BY MOUTH EVERY DAY (Patient not taking: Reported on 04/15/2018) 90 tablet 1  . Doxepin HCl (SILENOR) 6 MG TABS Take 1 tablet (6 mg total) by mouth at bedtime as needed (sleep). (Patient not taking: Reported on 04/15/2018) 90 tablet 1  . eszopiclone (LUNESTA) 2 MG TABS tablet Take 1 tablet (2 mg total) by mouth at bedtime as needed for sleep. Take immediately before bedtime (Patient not taking: Reported on 04/15/2018) 30 tablet 5  . Insulin Glargine, 2 Unit Dial, (TOUJEO MAX SOLOSTAR) 300 UNIT/ML SOPN Inject 20 Units into the skin daily. (Patient not taking: Reported on 03/18/2018) 6 mL 1  . sitaGLIPtin-metformin (JANUMET) 50-1000 MG tablet Take 1 tablet  by mouth daily.     No facility-administered medications prior to visit.     ROS Review of Systems  Constitutional: Negative.  Negative for appetite change, diaphoresis, fatigue and unexpected weight change.  HENT: Positive for trouble swallowing.   Eyes: Negative for visual disturbance.  Respiratory: Negative for cough, chest tightness, shortness of breath and wheezing.   Cardiovascular: Negative for chest pain, palpitations and leg swelling.  Gastrointestinal: Negative for abdominal pain.  Endocrine: Negative for polydipsia, polyphagia and polyuria.  Genitourinary: Negative.  Negative for difficulty urinating, dysuria and urgency.  Musculoskeletal:  Positive for gait problem. Negative for arthralgias.  Skin: Negative.  Negative for color change.  Neurological: Positive for dizziness. Negative for weakness, light-headedness and headaches.  Hematological: Negative for adenopathy. Does not bruise/bleed easily.  Psychiatric/Behavioral: Positive for sleep disturbance. Negative for decreased concentration and dysphoric mood. The patient is not nervous/anxious.     Objective:  BP 128/60 (BP Location: Left Arm, Patient Position: Sitting, Cuff Size: Normal)   Pulse 76   Temp 98.5 F (36.9 C) (Oral)   Resp 16   Ht 5' (1.524 m)   Wt 139 lb (63 kg)   SpO2 95%   BMI 27.15 kg/m   BP Readings from Last 3 Encounters:  04/15/18 128/60  03/18/18 130/62  02/14/18 (!) 124/56    Wt Readings from Last 3 Encounters:  04/15/18 139 lb (63 kg)  02/14/18 145 lb 4 oz (65.9 kg)  10/17/17 120 lb (54.4 kg)    Physical Exam Vitals signs reviewed.  Constitutional:      Appearance: She is not ill-appearing or diaphoretic.  HENT:     Nose: Nose normal. No congestion.     Mouth/Throat:     Mouth: Mucous membranes are moist.     Pharynx: Oropharynx is clear. No oropharyngeal exudate or posterior oropharyngeal erythema.  Eyes:     General: No scleral icterus.    Conjunctiva/sclera: Conjunctivae normal.  Neck:     Musculoskeletal: Normal range of motion and neck supple.  Cardiovascular:     Rate and Rhythm: Normal rate and regular rhythm.     Heart sounds: No murmur. No gallop.   Pulmonary:     Effort: Pulmonary effort is normal.     Breath sounds: Normal breath sounds. No stridor. No wheezing, rhonchi or rales.  Abdominal:     General: Abdomen is flat.     Palpations: There is no hepatomegaly, splenomegaly or mass.     Tenderness: There is no abdominal tenderness.  Musculoskeletal: Normal range of motion.     Right lower leg: No edema.     Left lower leg: No edema.  Skin:    General: Skin is warm and dry.  Neurological:     Mental Status:  She is oriented to person, place, and time. Mental status is at baseline.     Motor: Weakness present.     Coordination: Coordination abnormal.     Gait: Gait abnormal.     Deep Tendon Reflexes: Reflexes abnormal.  Psychiatric:        Mood and Affect: Mood normal.        Behavior: Behavior normal.     Lab Results  Component Value Date   WBC 7.0 03/18/2018   HGB 12.3 03/18/2018   HCT 39.8 03/18/2018   PLT 278 03/18/2018   GLUCOSE 355 (H) 03/18/2018   CHOL 162 08/23/2017   TRIG 137.0 08/23/2017   HDL 39.70 08/23/2017   LDLDIRECT 100.0 07/06/2015  [place] 95 08/23/2017   ALT 8 08/23/2017   AST 9 08/23/2017   NA 134 (L) 03/18/2018   K 4.1 03/18/2018   CL 99 03/18/2018   CREATININE 1.39 (H) 03/18/2018   BUN 24 (H) 03/18/2018   CO2 26 03/18/2018   TSH 2.04 08/23/2017   INR 1.1 (H) 02/25/2015   HGBA1C 9.8 (A) 04/15/2018   MICROALBUR 2.7 (H) 08/23/2017    No results found.  Assessment & Plan:   Michelle Lopez was seen today for hypertension and diabetes.  Diagnoses and all orders for this visit:  CRI (chronic renal insufficiency), stage 4 (severe) (HCC)-she will avoid nephrotoxic agents.  I will try to get better control of her blood sugar.  Vitamin D deficiency -     Vitamin D, Ergocalciferol, (DRISDOL) 1.25 MG (50000 UT) CAPS capsule; Take 1 capsule (50,000 Units total) by mouth every 7 (seven) days. TAKE 1 CAPSULE BY MOUTH EVERY 7 DAYS -     VITAMIN D 25 Hydroxy (Vit-D Deficiency, Fractures); Future  Osteopenia, senile -     Vitamin D, Ergocalciferol, (DRISDOL) 1.25 MG (50000 UT) CAPS capsule; Take 1 capsule (50,000 Units total) by mouth every 7 (seven) days. TAKE 1 CAPSULE BY MOUTH EVERY 7 DAYS -     VITAMIN D 25 Hydroxy (Vit-D Deficiency, Fractures); Future  Type 2 diabetes mellitus with diabetic nephropathy, without long-term current use of insulin ([place])- Her blood sugars are not well controlled.  I have asked her to restart basal insulin.  We gave her a dose here in the  office and samples.  She has mild renal insufficiency so I have asked her to limit her dose of metformin to 1000 mcg a day.  Will also continue the DPP 4 inhibitor but at a renally adjusted dose. -     Amb Referral to Nutrition and Diabetic E -     Consult to [facility] Care Management -     POCT glycosylated hemoglobin (Hb A1C) -     POCT Glucose (Device for Home Use) -     Insulin Glargine, 2 Unit Dial, (TOUJEO MAX SOLOSTAR) 300 UNIT/ML SOPN; Inject 30 Units into the skin daily. -     SitaGLIPtin-MetFORMIN HCl (JANUMET XR) 50-1000 MG TB24; Take 1 tablet by mouth daily.  Psychophysiological insomnia -     Suvorexant (BELSOMRA) 10 MG TABS; Take 1 tablet by mouth at bedtime.   I have discontinued Michelle Lopez's eszopiclone, sitaGLIPtin, metFORMIN, sitaGLIPtin-metformin, and Doxepin HCl. I have also changed her Vitamin D (Ergocalciferol) and Insulin Glargine (2 Unit Dial). Additionally, I am having her start on SitaGLIPtin-MetFORMIN HCl and Suvorexant. Lastly, I am having her maintain her aspirin, onetouch ultrasoft, metoprolol tartrate, amLODipine, clopidogrel, feeding supplement (ENSURE COMPLETE), atorvastatin, cetirizine, Contour Next EZ, glucose blood, tamsulosin, and losartan-hydrochlorothiazide.  Meds ordered this encounter  Medications  . Vitamin D, Ergocalciferol, (DRISDOL) 1.25 MG (50000 UT) CAPS capsule    Sig: Take 1 capsule (50,000 Units total) by mouth every 7 (seven) days. TAKE 1 CAPSULE BY MOUTH EVERY 7 DAYS    Dispense:  90 capsule    Refill:  0  . Insulin Glargine, 2 Unit Dial, (TOUJEO MAX SOLOSTAR) 300 UNIT/ML SOPN    Sig: Inject 30 Units into the skin daily.    Dispense:  6 mL    Refill:  1  . SitaGLIPtin-MetFORMIN HCl (JANUMET XR) 50-1000 MG TB24    Sig: Take 1 tablet by mouth daily.    Dispense:  90 tablet    Refill:  1  . Suvorexant (BELSOMRA) 10 MG TABS    Sig: Take 1 tablet by mouth at bedtime.    Dispense:  30 tablet    Refill:  5     Follow-up: Return in about 4  months (around 08/15/2018).  Scarlette Calico, MD

## 2018-04-15 NOTE — Telephone Encounter (Signed)
Daughter states pharmacy sent PA form over to be processed for belsomra.

## 2018-04-15 NOTE — Telephone Encounter (Signed)
Key: [id number]

## 2018-04-15 NOTE — Patient Instructions (Signed)
Type 2 Diabetes Mellitus, Diagnosis, Adult Type 2 diabetes (type 2 diabetes mellitus) is a long-term (chronic) disease. In type 2 diabetes, one or both of these problems may be present:  The pancreas does not make enough of a hormone called insulin.  Cells in the body do not respond properly to insulin that the body makes (insulin resistance). Normally, insulin allows blood sugar (glucose) to enter cells in the body. The cells use glucose for energy. Insulin resistance or lack of insulin causes excess glucose to build up in the blood instead of going into cells. As a result, high blood glucose (hyperglycemia) develops. What increases the risk? The following factors may make you more likely to develop type 2 diabetes:  Having a family member with type 2 diabetes.  Being overweight or obese.  Having an inactive (sedentary) lifestyle.  Having been diagnosed with insulin resistance.  Having a history of prediabetes, gestational diabetes, or polycystic ovary syndrome (PCOS).  Being of American-Indian, African-American, Hispanic/Latino, or Asian/Pacific Islander descent. What are the signs or symptoms? In the early stage of this condition, you may not have symptoms. Symptoms develop slowly and may include:  Increased thirst (polydipsia).  Increased hunger(polyphagia).  Increased urination (polyuria).  Increased urination during the night (nocturia).  Unexplained weight loss.  Frequent infections that keep coming back (recurring).  Fatigue.  Weakness.  Vision changes, such as blurry vision.  Cuts or bruises that are slow to heal.  Tingling or numbness in the hands or feet.  Dark patches on the skin (acanthosis nigricans). How is this diagnosed? This condition is diagnosed based on your symptoms, your medical history, a physical exam, and your blood glucose level. Your blood glucose may be checked with one or more of the following blood tests:  A fasting blood glucose (FBG)  test. You will not be allowed to eat (you will fast) for 8 hours or longer before a blood sample is taken.  A random blood glucose test. This test checks blood glucose at any time of day regardless of when you ate.  An A1c (hemoglobin A1c) blood test. This test provides information about blood glucose control over the previous 2-3 months.  An oral glucose tolerance test (OGTT). This test measures your blood glucose at two times: ? After fasting. This is your baseline blood glucose level. ? Two hours after drinking a beverage that contains glucose. You may be diagnosed with type 2 diabetes if:  Your FBG level is 126 mg/dL (7.0 mmol/L) or higher.  Your random blood glucose level is 200 mg/dL (11.1 mmol/L) or higher.  Your A1c level is 6.5% or higher.  Your OGTT result is higher than 200 mg/dL (11.1 mmol/L). These blood tests may be repeated to confirm your diagnosis. How is this treated? Your treatment may be managed by a specialist called an endocrinologist. Type 2 diabetes may be treated by following instructions from your health care provider about:  Making diet and lifestyle changes. This may include: ? Following an individualized nutrition plan that is developed by a diet and nutrition specialist (registered dietitian). ? Exercising regularly. ? Finding ways to manage stress.  Checking your blood glucose level as often as told.  Taking diabetes medicines or insulin daily. This helps to keep your blood glucose levels in the healthy range. ? If you use insulin, you may need to adjust the dosage depending on how physically active you are and what foods you eat. Your health care provider will tell you how to adjust your dosage.    Taking medicines to help prevent complications from diabetes, such as: ? Aspirin. ? Medicine to lower cholesterol. ? Medicine to control blood pressure. Your health care provider will set individualized treatment goals for you. Your goals will be based on  your age, other medical conditions you have, and how you respond to diabetes treatment. Generally, the goal of treatment is to maintain the following blood glucose levels:  Before meals (preprandial): 80-130 mg/dL (4.4-7.2 mmol/L).  After meals (postprandial): below 180 mg/dL (10 mmol/L).  A1c level: less than 7%. Follow these instructions at home: Questions to ask your health care provider  Consider asking the following questions: ? Do I need to meet with a diabetes educator? ? Where can I find a support group for people with diabetes? ? What equipment will I need to manage my diabetes at home? ? What diabetes medicines do I need, and when should I take them? ? How often do I need to check my blood glucose? ? What number can I call if I have questions? ? When is my next appointment? General instructions  Take over-the-counter and prescription medicines only as told by your health care provider.  Keep all follow-up visits as told by your health care provider. This is important.  For more information about diabetes, visit: ? American Diabetes Association (ADA): www.diabetes.org ? American Association of Diabetes Educators (AADE): www.diabeteseducator.org Contact a health care provider if:  Your blood glucose is at or above 240 mg/dL (13.3 mmol/L) for 2 days in a row.  You have been sick or have had a fever for 2 days or longer, and you are not getting better.  You have any of the following problems for more than 6 hours: ? You cannot eat or drink. ? You have nausea and vomiting. ? You have diarrhea. Get help right away if:  Your blood glucose is lower than 54 mg/dL (3.0 mmol/L).  You become confused or you have trouble thinking clearly.  You have difficulty breathing.  You have moderate or large ketone levels in your urine. Summary  Type 2 diabetes (type 2 diabetes mellitus) is a long-term (chronic) disease. In type 2 diabetes, the pancreas does not make enough of a  hormone called insulin, or cells in the body do not respond properly to insulin that the body makes (insulin resistance).  This condition is treated by making diet and lifestyle changes and taking diabetes medicines or insulin.  Your health care provider will set individualized treatment goals for you. Your goals will be based on your age, other medical conditions you have, and how you respond to diabetes treatment.  Keep all follow-up visits as told by your health care provider. This is important. This information is not intended to replace advice given to you by your health care provider. Make sure you discuss any questions you have with your health care provider. Document Released: 01/23/2005 Document Revised: 08/24/2016 Document Reviewed: 02/26/2015 [employer]vier Interactive Patient Education  2019 [employer]  

## 2018-04-15 NOTE — Patient Outreach (Signed)
[facility]) Care Management  04/15/2018  Hiya Point Aug 09, 1945 [id number]   Telephone Screen  Referral Date: 04/15/2018 Referral Source: MD office Referral Reason: "poorly controlled DM" Insurance: Medicare & Medicaid   Outreach attempt # 1 to patient. A female answered the phone and identified herself as patient's daughter-[name]. She voices she is not with patient at present. RN CM provided contact info for daughter to give to patient to call RN CM back.      Plan: RN CM will make outreach attempt to patient within 3-4 business days. RN CM will send unsuccessful letter to patient.    Enzo Montgomery, RN,BSN,CCM [place] [facility] Telephonic Care Management Coordinator Direct Phone: [phone] Toll Free: [phone] Fax: [phone]

## 2018-04-16 NOTE — Telephone Encounter (Signed)
PA has been approved.   Sent fax to pharmacy informing of same and requesting that they reach out to the patient when ready for pick up.

## 2018-04-17 ENCOUNTER — Other Ambulatory Visit: Payer: Self-pay

## 2018-04-17 DIAGNOSIS — I639 Cerebral infarction, unspecified: Secondary | ICD-10-CM | POA: Diagnosis not present

## 2018-04-17 DIAGNOSIS — N183 Chronic kidney disease, stage 3 (moderate): Secondary | ICD-10-CM | POA: Diagnosis not present

## 2018-04-17 DIAGNOSIS — F0390 Unspecified dementia without behavioral disturbance: Secondary | ICD-10-CM | POA: Diagnosis not present

## 2018-04-17 DIAGNOSIS — I129 Hypertensive chronic kidney disease with stage 1 through stage 4 chronic kidney disease, or unspecified chronic kidney disease: Secondary | ICD-10-CM | POA: Diagnosis not present

## 2018-04-17 NOTE — Patient Outreach (Signed)
[facility]) Care Management  04/17/2018  Raizel Wesolowski [birth year] [id number]   Telephone Screen  Referral Date: 04/15/2018 Referral Source: MD office Referral Reason: "poorly controlled DM" Insurance: Medicare & Medicaid   Outreach attempt #2 to patient. No answer at present.     Plan: RN CM will make outreach attempt to patient within 3-4 business days.   Enzo Montgomery, RN,BSN,CCM [place] [facility] Telephonic Care Management Coordinator Direct Phone: [phone] Toll Free: [phone] Fax: [phone]

## 2018-04-19 ENCOUNTER — Other Ambulatory Visit: Payer: Self-pay

## 2018-04-19 NOTE — Patient Outreach (Signed)
[facility]) Care Management  04/19/2018  Michelle Lopez Jun 12, 1945 [id number]    Telephone Screen  Referral Date:04/15/2018 Referral Source:MD office Referral Reason:"poorly controlled DM" Insurance:Medicare & Medicaid   Outreach attempt #3 to patient. No answer at present.    Plan: RN CM will close case if no response from letter mailed to patient.  Enzo Montgomery, RN,BSN,CCM [place] Management Telephonic Care Management Coordinator Direct Phone: [phone] Toll Free: [phone] Fax: [phone]

## 2018-04-22 ENCOUNTER — Other Ambulatory Visit: Payer: Self-pay | Admitting: Nephrology

## 2018-04-22 DIAGNOSIS — N183 Chronic kidney disease, stage 3 unspecified: Secondary | ICD-10-CM

## 2018-04-23 ENCOUNTER — Other Ambulatory Visit: Payer: Self-pay | Admitting: Nephrology

## 2018-04-23 DIAGNOSIS — N183 Chronic kidney disease, stage 3 unspecified: Secondary | ICD-10-CM

## 2018-04-26 ENCOUNTER — Other Ambulatory Visit: Payer: Self-pay

## 2018-04-26 NOTE — Patient Outreach (Signed)
[facility]) Care Management  04/26/2018  Lindsy Cerullo [birth year] [id number]   Telephone Screen  Referral Date:04/15/2018 Referral Source:MD office Referral Reason:"poorly controlled DM" Insurance:Medicare & Medicaid   Multiple attempts to establish contact with patient without success. No response from letter mailed to patient. Case is being closed at this time.    Plan: RN CM will close case at this time. RN CM will send MD case closure letter.   Enzo Montgomery, RN,BSN,CCM [place] Management Telephonic Care Management Coordinator Direct Phone: [phone] Toll Free: [phone] Fax: [phone]

## 2018-05-13 ENCOUNTER — Other Ambulatory Visit: Payer: Self-pay | Admitting: Internal Medicine

## 2018-05-13 DIAGNOSIS — I739 Peripheral vascular disease, unspecified: Secondary | ICD-10-CM

## 2018-05-20 ENCOUNTER — Other Ambulatory Visit: Payer: Self-pay | Admitting: Internal Medicine

## 2018-05-20 DIAGNOSIS — E1121 Type 2 diabetes mellitus with diabetic nephropathy: Secondary | ICD-10-CM

## 2018-05-20 MED ORDER — INSULIN PEN NEEDLE 32G X 6 MM MISC: 1.0000 | Freq: Every day | 3 refills | Status: DC

## 2018-05-22 ENCOUNTER — Other Ambulatory Visit: Payer: Self-pay | Admitting: Internal Medicine

## 2018-05-22 DIAGNOSIS — E1121 Type 2 diabetes mellitus with diabetic nephropathy: Secondary | ICD-10-CM

## 2018-05-22 DIAGNOSIS — E441 Mild protein-calorie malnutrition: Secondary | ICD-10-CM

## 2018-05-22 DIAGNOSIS — F0390 Unspecified dementia without behavioral disturbance: Secondary | ICD-10-CM

## 2018-05-22 DIAGNOSIS — I69391 Dysphagia following cerebral infarction: Secondary | ICD-10-CM

## 2018-05-22 MED ORDER — ENSURE COMPLETE PO LIQD: 237.0000 mL | Freq: Two times a day (BID) | ORAL | 5 refills | Status: DC

## 2018-05-22 MED ORDER — INSULIN GLARGINE (2 UNIT DIAL) 300 UNIT/ML ~~LOC~~ SOPN: 30.0000 [IU] | PEN_INJECTOR | Freq: Every day | SUBCUTANEOUS | 1 refills | Status: DC

## 2018-05-22 NOTE — Telephone Encounter (Signed)
Requested medication (s) are due for refill today: Yes  Requested medication (s) are on the active medication list: Yes  Last refill:  04/15/18  Future visit scheduled: Yes  Notes to clinic:  See request    Requested Prescriptions  Pending Prescriptions Disp Refills   Insulin Glargine, 2 Unit Dial, (TOUJEO MAX SOLOSTAR) 300 UNIT/ML SOPN 6 mL 1    Sig: Inject 30 Units into the skin daily.     There is no refill protocol information for this order     feeding supplement, ENSURE COMPLETE, (ENSURE COMPLETE) LIQD 60 Bottle 5    Sig: Take 237 mLs by mouth 2 (two) times daily between meals.     There is no refill protocol information for this order

## 2018-05-23 ENCOUNTER — Telehealth: Payer: Self-pay | Admitting: Internal Medicine

## 2018-05-23 DIAGNOSIS — E1121 Type 2 diabetes mellitus with diabetic nephropathy: Secondary | ICD-10-CM

## 2018-05-23 MED ORDER — GLUCERNA PO LIQD: 1.0000 | Freq: Two times a day (BID) | ORAL | 5 refills | Status: DC

## 2018-05-23 MED ORDER — INSULIN GLARGINE (2 UNIT DIAL) 300 UNIT/ML ~~LOC~~ SOPN: 30.0000 [IU] | PEN_INJECTOR | Freq: Every day | SUBCUTANEOUS | 1 refills | Status: DC

## 2018-05-23 NOTE — Telephone Encounter (Signed)
Spoke to pt dtr and informed rx for glucerna was sent. She requested that we also resend the insulin. Insulin has been resent.

## 2018-05-23 NOTE — Telephone Encounter (Signed)
Copied from [place] [id number]. Topic: Quick Communication - See Telephone Encounter >> May 23, 2018 11:29 AM Ivar Drape wrote: CRM for notification. See Telephone encounter for: 05/23/18. Patient needs a prescription for Glucerna.

## 2018-05-23 NOTE — Telephone Encounter (Signed)
Copied from [place] [id number]. Topic: General - Other >> May 23, 2018 12:50 PM Mcneil, Ja-Kwan wrote: Reason for CRM: Pt stated she was told by her pharmacy that they have not received a Rx refill request for Insulin Glargine, 2 Unit Dial, (TOUJEO MAX SOLOSTAR) 300 UNIT/ML SOPN. Pt asked that the refill request be resent because she is out of the medication.

## 2018-05-23 NOTE — Telephone Encounter (Signed)
Pt stated the Rx for Glucerna should be sent to fax# [phone] attention Ronnald Ramp

## 2018-05-24 ENCOUNTER — Other Ambulatory Visit: Payer: Self-pay | Admitting: Internal Medicine

## 2018-05-24 DIAGNOSIS — E1121 Type 2 diabetes mellitus with diabetic nephropathy: Secondary | ICD-10-CM

## 2018-05-24 MED ORDER — INSULIN GLARGINE (2 UNIT DIAL) 300 UNIT/ML ~~LOC~~ SOPN: 30.0000 [IU] | PEN_INJECTOR | Freq: Every day | SUBCUTANEOUS | 1 refills | Status: DC

## 2018-05-24 NOTE — Telephone Encounter (Signed)
RX was sent yesterday RX sent again today

## 2018-05-24 NOTE — Telephone Encounter (Signed)
Left voicemail with dr Ronnald Ramp note/instructions, call back if any further questions

## 2018-05-24 NOTE — Telephone Encounter (Signed)
Routing to dr jones, please advise, thanks 

## 2018-05-27 NOTE — Telephone Encounter (Addendum)
Misinformation was given.   Rx required a PA.   Key: [id number]   Called and left [name] (pt dtr) a message informing that PA has been started.

## 2018-05-28 ENCOUNTER — Other Ambulatory Visit: Payer: Self-pay | Admitting: Internal Medicine

## 2018-05-28 ENCOUNTER — Telehealth: Payer: Self-pay | Admitting: *Deleted

## 2018-05-28 NOTE — Telephone Encounter (Signed)
Which basal insulin does her insurance cover?

## 2018-05-28 NOTE — Telephone Encounter (Signed)
Copied from [place] [id number]. Topic: General - Other >> May 28, 2018  9:53 AM Carolyn Stare wrote:  [employer] will not cover  Insulin Glargine, 2 Unit Dial, (TOUJEO MAX SOLOSTAR) 300 UNIT/ML SOPN   pt is asking  for something else to be called in to her pharmacy     [facility]

## 2018-05-28 NOTE — Telephone Encounter (Signed)
Left mess for patient to call back.  

## 2018-05-28 NOTE — Telephone Encounter (Signed)
[facility] and spoke [facility] Lanelle Bal- she states Toujeo max solostar is covered and there is no copay. If patient needs refill she just [facility]. Left mess for patient to call back.

## 2018-05-29 NOTE — Telephone Encounter (Signed)
lvm for [name] (pt dtr) details: PA was approved and pharmacy stated there a $0 copay.

## 2018-06-19 ENCOUNTER — Ambulatory Visit
Admission: RE | Admit: 2018-06-19 | Discharge: 2018-06-19 | Disposition: A | Discharge status: Home / Self Care | Payer: Medicare Other | Source: Ambulatory Visit | Attending: Nephrology | Admitting: Nephrology

## 2018-06-19 ENCOUNTER — Other Ambulatory Visit: Payer: Self-pay

## 2018-06-19 DIAGNOSIS — N183 Chronic kidney disease, stage 3 unspecified: Secondary | ICD-10-CM

## 2018-06-23 ENCOUNTER — Other Ambulatory Visit: Payer: Self-pay | Admitting: Internal Medicine

## 2018-07-23 ENCOUNTER — Telehealth: Payer: Self-pay | Admitting: Internal Medicine

## 2018-07-23 DIAGNOSIS — E1121 Type 2 diabetes mellitus with diabetic nephropathy: Secondary | ICD-10-CM

## 2018-07-23 MED ORDER — TOUJEO MAX SOLOSTAR 300 UNIT/ML ~~LOC~~ SOPN: 30.0000 [IU] | PEN_INJECTOR | Freq: Every day | SUBCUTANEOUS | 1 refills | Status: DC

## 2018-07-23 NOTE — Telephone Encounter (Signed)
Pt daughter called and is requesting refill for Insulin Glargine, 2 Unit Dial, (TOUJEO MAX SOLOSTAR) 300 UNIT/ML SOPN.    [id number] [place] [place]  [address] [place] [address]  Phone: [phone] Fax: [phone]  Open 24 hours

## 2018-07-30 ENCOUNTER — Telehealth: Payer: Self-pay

## 2018-07-30 NOTE — Telephone Encounter (Signed)
Copied from [place] [phone]. Topic: Appointment Scheduling - Scheduling Inquiry for Clinic >> Jul 29, 2018  4:45 PM Berneta Levins wrote: Reason for CRM:  Linda Hedges, social worker with [facility] calling.  States that she is aware pt has an appointment on Wednesday and wonders if the PCP can assesed for cognitive skills.  Pt lives alone and social worker needs to have safety in the home addressed.  Specifically with remembering to take medications - aids in the home aren't allowed to give medications only reminders that medications need to be taken. Linda Hedges can be reached at [phone].

## 2018-07-30 NOTE — Telephone Encounter (Signed)
I do not need to see her this week to know that she does not have the cognitive abilities to live on her own or to administer medications.  If her children are not willing to help her and the home is not safe then she should be placed in a safe environment where she can receive the support that she needs.  Michelle Lopez

## 2018-07-30 NOTE — Telephone Encounter (Signed)
lvm for [employer]

## 2018-07-31 ENCOUNTER — Ambulatory Visit (INDEPENDENT_AMBULATORY_CARE_PROVIDER_SITE_OTHER): Payer: Medicare Other | Admitting: Internal Medicine

## 2018-07-31 ENCOUNTER — Other Ambulatory Visit: Payer: Self-pay

## 2018-07-31 ENCOUNTER — Encounter: Payer: Self-pay | Admitting: Internal Medicine

## 2018-07-31 ENCOUNTER — Other Ambulatory Visit (INDEPENDENT_AMBULATORY_CARE_PROVIDER_SITE_OTHER): Payer: Medicare Other

## 2018-07-31 VITALS — BP 120/66 | HR 72 | Temp 98.6°F | Ht 60.0 in | Wt 137.0 lb

## 2018-07-31 DIAGNOSIS — L602 Onychogryphosis: Secondary | ICD-10-CM

## 2018-07-31 DIAGNOSIS — I6300 Cerebral infarction due to thrombosis of unspecified precerebral artery: Secondary | ICD-10-CM

## 2018-07-31 DIAGNOSIS — E538 Deficiency of other specified B group vitamins: Secondary | ICD-10-CM

## 2018-07-31 DIAGNOSIS — E1121 Type 2 diabetes mellitus with diabetic nephropathy: Secondary | ICD-10-CM | POA: Diagnosis not present

## 2018-07-31 DIAGNOSIS — F0390 Unspecified dementia without behavioral disturbance: Secondary | ICD-10-CM

## 2018-07-31 DIAGNOSIS — I10 Essential (primary) hypertension: Secondary | ICD-10-CM | POA: Diagnosis not present

## 2018-07-31 DIAGNOSIS — N184 Chronic kidney disease, stage 4 (severe): Secondary | ICD-10-CM | POA: Diagnosis not present

## 2018-07-31 DIAGNOSIS — E559 Vitamin D deficiency, unspecified: Secondary | ICD-10-CM

## 2018-07-31 DIAGNOSIS — E785 Hyperlipidemia, unspecified: Secondary | ICD-10-CM | POA: Diagnosis not present

## 2018-07-31 DIAGNOSIS — E441 Mild protein-calorie malnutrition: Secondary | ICD-10-CM | POA: Diagnosis not present

## 2018-07-31 LAB — HEPATIC FUNCTION PANEL
ALT: 9 U/L (ref 0–35)
AST: 15 U/L (ref 0–37)
Albumin: 4 g/dL (ref 3.5–5.2)
Alkaline Phosphatase: 56 U/L (ref 39–117)
Bilirubin, Direct: 0 mg/dL (ref 0.0–0.3)
Total Bilirubin: 0.3 mg/dL (ref 0.2–1.2)
Total Protein: 7.5 g/dL (ref 6.0–8.3)

## 2018-07-31 LAB — URINALYSIS, ROUTINE W REFLEX MICROSCOPIC
Bilirubin Urine: NEGATIVE
Hgb urine dipstick: NEGATIVE
Ketones, ur: NEGATIVE
Nitrite: NEGATIVE
Specific Gravity, Urine: 1.02 (ref 1.000–1.030)
Total Protein, Urine: NEGATIVE
Urine Glucose: NEGATIVE
Urobilinogen, UA: 0.2 (ref 0.0–1.0)
pH: 5.5 (ref 5.0–8.0)

## 2018-07-31 LAB — BASIC METABOLIC PANEL
BUN: 16 mg/dL (ref 6–23)
CO2: 24 mEq/L (ref 19–32)
Calcium: 8.9 mg/dL (ref 8.4–10.5)
Chloride: 103 mEq/L (ref 96–112)
Creatinine, Ser: 0.96 mg/dL (ref 0.40–1.20)
GFR: 68.47 mL/min (ref >60.00)
Glucose, Bld: 87 mg/dL (ref 70–99)
Potassium: 3.6 mEq/L (ref 3.5–5.1)
Sodium: 139 mEq/L (ref 135–145)

## 2018-07-31 LAB — MICROALBUMIN / CREATININE URINE RATIO
Creatinine,U: 116.8 mg/dL
Microalb Creat Ratio: 0.6 mg/g (ref 0.0–30.0)
Microalb, Ur: <0.7 mg/dL (ref 0.0–1.9)

## 2018-07-31 LAB — TSH: TSH: 1.3 u[IU]/mL (ref 0.35–4.50)

## 2018-07-31 LAB — LIPID PANEL
Cholesterol: 148 mg/dL (ref 0–200)
HDL: 38.2 mg/dL — ABNORMAL LOW (ref >39.00)
LDL Cholesterol: 88 mg/dL (ref 0–99)
NonHDL: 110.07
Total CHOL/HDL Ratio: 4
Triglycerides: 112 mg/dL (ref 0.0–149.0)
VLDL: 22.4 mg/dL (ref 0.0–40.0)

## 2018-07-31 LAB — FOLATE: Folate: 12.5 ng/mL (ref >5.9)

## 2018-07-31 LAB — VITAMIN D 25 HYDROXY (VIT D DEFICIENCY, FRACTURES): VITD: 94.82 ng/mL (ref 30.00–100.00)

## 2018-07-31 MED ORDER — CYANOCOBALAMIN 1000 MCG/ML IJ SOLN
1000.0000 ug | Freq: Once | INTRAMUSCULAR | Status: AC
  Administered 2018-07-31: 1000 ug via INTRAMUSCULAR

## 2018-07-31 NOTE — Patient Instructions (Signed)
Type 2 Diabetes Mellitus, Diagnosis, Adult Type 2 diabetes (type 2 diabetes mellitus) is a long-term (chronic) disease. In type 2 diabetes, one or both of these problems may be present:  The pancreas does not make enough of a hormone called insulin.  Cells in the body do not respond properly to insulin that the body makes (insulin resistance). Normally, insulin allows blood sugar (glucose) to enter cells in the body. The cells use glucose for energy. Insulin resistance or lack of insulin causes excess glucose to build up in the blood instead of going into cells. As a result, high blood glucose (hyperglycemia) develops. What increases the risk? The following factors may make you more likely to develop type 2 diabetes:  Having a family member with type 2 diabetes.  Being overweight or obese.  Having an inactive (sedentary) lifestyle.  Having been diagnosed with insulin resistance.  Having a history of prediabetes, gestational diabetes, or polycystic ovary syndrome (PCOS).  Being of American-Indian, African-American, Hispanic/Latino, or Asian/Pacific Islander descent. What are the signs or symptoms? In the early stage of this condition, you may not have symptoms. Symptoms develop slowly and may include:  Increased thirst (polydipsia).  Increased hunger(polyphagia).  Increased urination (polyuria).  Increased urination during the night (nocturia).  Unexplained weight loss.  Frequent infections that keep coming back (recurring).  Fatigue.  Weakness.  Vision changes, such as blurry vision.  Cuts or bruises that are slow to heal.  Tingling or numbness in the hands or feet.  Dark patches on the skin (acanthosis nigricans). How is this diagnosed? This condition is diagnosed based on your symptoms, your medical history, a physical exam, and your blood glucose level. Your blood glucose may be checked with one or more of the following blood tests:  A fasting blood glucose (FBG)  test. You will not be allowed to eat (you will fast) for 8 hours or longer before a blood sample is taken.  A random blood glucose test. This test checks blood glucose at any time of day regardless of when you ate.  An A1c (hemoglobin A1c) blood test. This test provides information about blood glucose control over the previous 2-3 months.  An oral glucose tolerance test (OGTT). This test measures your blood glucose at two times: ? After fasting. This is your baseline blood glucose level. ? Two hours after drinking a beverage that contains glucose. You may be diagnosed with type 2 diabetes if:  Your FBG level is 126 mg/dL (7.0 mmol/L) or higher.  Your random blood glucose level is 200 mg/dL (11.1 mmol/L) or higher.  Your A1c level is 6.5% or higher.  Your OGTT result is higher than 200 mg/dL (11.1 mmol/L). These blood tests may be repeated to confirm your diagnosis. How is this treated? Your treatment may be managed by a specialist called an endocrinologist. Type 2 diabetes may be treated by following instructions from your health care provider about:  Making diet and lifestyle changes. This may include: ? Following an individualized nutrition plan that is developed by a diet and nutrition specialist (registered dietitian). ? Exercising regularly. ? Finding ways to manage stress.  Checking your blood glucose level as often as told.  Taking diabetes medicines or insulin daily. This helps to keep your blood glucose levels in the healthy range. ? If you use insulin, you may need to adjust the dosage depending on how physically active you are and what foods you eat. Your health care provider will tell you how to adjust your dosage.    Taking medicines to help prevent complications from diabetes, such as: ? Aspirin. ? Medicine to lower cholesterol. ? Medicine to control blood pressure. Your health care provider will set individualized treatment goals for you. Your goals will be based on  your age, other medical conditions you have, and how you respond to diabetes treatment. Generally, the goal of treatment is to maintain the following blood glucose levels:  Before meals (preprandial): 80-130 mg/dL (4.4-7.2 mmol/L).  After meals (postprandial): below 180 mg/dL (10 mmol/L).  A1c level: less than 7%. Follow these instructions at home: Questions to ask your health care provider  Consider asking the following questions: ? Do I need to meet with a diabetes educator? ? Where can I find a support group for people with diabetes? ? What equipment will I need to manage my diabetes at home? ? What diabetes medicines do I need, and when should I take them? ? How often do I need to check my blood glucose? ? What number can I call if I have questions? ? When is my next appointment? General instructions  Take over-the-counter and prescription medicines only as told by your health care provider.  Keep all follow-up visits as told by your health care provider. This is important.  For more information about diabetes, visit: ? American Diabetes Association (ADA): www.diabetes.org ? American Association of Diabetes Educators (AADE): www.diabeteseducator.org Contact a health care provider if:  Your blood glucose is at or above 240 mg/dL (13.3 mmol/L) for 2 days in a row.  You have been sick or have had a fever for 2 days or longer, and you are not getting better.  You have any of the following problems for more than 6 hours: ? You cannot eat or drink. ? You have nausea and vomiting. ? You have diarrhea. Get help right away if:  Your blood glucose is lower than 54 mg/dL (3.0 mmol/L).  You become confused or you have trouble thinking clearly.  You have difficulty breathing.  You have moderate or large ketone levels in your urine. Summary  Type 2 diabetes (type 2 diabetes mellitus) is a long-term (chronic) disease. In type 2 diabetes, the pancreas does not make enough of a  hormone called insulin, or cells in the body do not respond properly to insulin that the body makes (insulin resistance).  This condition is treated by making diet and lifestyle changes and taking diabetes medicines or insulin.  Your health care provider will set individualized treatment goals for you. Your goals will be based on your age, other medical conditions you have, and how you respond to diabetes treatment.  Keep all follow-up visits as told by your health care provider. This is important. This information is not intended to replace advice given to you by your health care provider. Make sure you discuss any questions you have with your health care provider. Document Released: 01/23/2005 Document Revised: 08/24/2016 Document Reviewed: 02/26/2015 [employer]vier Interactive Patient Education  2019 [employer]  

## 2018-07-31 NOTE — Progress Notes (Signed)
Subjective:  Patient ID: Michelle Lopez, female    DOB: 10-Jun-1945  Age: 73 y.o. MRN: [id number]  CC: Hypertension, Hyperlipidemia, Diabetes, and Anemia   HPI Michelle Lopez presents for f/up - Her daughter is with her today and complains that she needs more help for her mother in the home.  She tells me that her mother is not willing to be compliant with her medical therapy.  It is difficult to get a history from her but she and the daughter do not offer any physical complaints today.  She has decided not to take anything for insomnia.  Outpatient Medications Prior to Visit  Medication Sig Dispense Refill  . amLODipine (NORVASC) 5 MG tablet TAKE 1 TABLET BY MOUTH EVERY DAY 90 tablet 1  . aspirin 81 MG tablet Take 81 mg by mouth daily.      Marland Kitchen atorvastatin (LIPITOR) 20 MG tablet TAKE 1 TABLET BY MOUTH EVERY DAY 90 tablet 1  . Blood Glucose Monitoring Suppl (CONTOUR NEXT EZ) w/Device KIT 1 Act by Does not apply route 2 (two) times daily. 1 kit 0  . cetirizine (ZYRTEC) 10 MG tablet Take 1 tablet (10 mg total) by mouth daily. 90 tablet 3  . clopidogrel (PLAVIX) 75 MG tablet Take 1 tablet (75 mg total) by mouth daily. 90 tablet 1  . Glucerna (GLUCERNA) LIQD Take 1 Can by mouth 2 (two) times daily between meals. 60 Can 5  . glucose blood (ONETOUCH VERIO) test strip Use to check blood sugars twice a day 300 each 1  . Insulin Glargine, 2 Unit Dial, (TOUJEO MAX SOLOSTAR) 300 UNIT/ML SOPN Inject 30 Units into the skin daily. 6 mL 1  . Insulin Pen Needle (NOVOFINE) 32G X 6 MM MISC Inject 1 Act into the skin daily. 100 each 3  . Lancets (ONETOUCH ULTRASOFT) lancets Use to check blood sugars twice a day 300 each 1  . losartan-hydrochlorothiazide (HYZAAR) 100-12.5 MG tablet TAKE 1 TABLET BY MOUTH EVERY DAY 90 tablet 1  . metoprolol tartrate (LOPRESSOR) 25 MG tablet Take 0.5 tablets (12.5 mg total) by mouth 2 (two) times daily. 180 tablet 1  . SitaGLIPtin-MetFORMIN HCl (JANUMET XR) 50-1000 MG TB24 Take 1  tablet by mouth daily. 90 tablet 1  . tamsulosin (FLOMAX) 0.4 MG CAPS capsule TAKE 1 CAPSULE (0.4 MG TOTAL) BY MOUTH DAILY. 90 capsule 1  . Vitamin D, Ergocalciferol, (DRISDOL) 1.25 MG (50000 UT) CAPS capsule Take 1 capsule (50,000 Units total) by mouth every 7 (seven) days. TAKE 1 CAPSULE BY MOUTH EVERY 7 DAYS 90 capsule 0  . Suvorexant (BELSOMRA) 10 MG TABS Take 1 tablet by mouth at bedtime. 30 tablet 5   No facility-administered medications prior to visit.     ROS Review of Systems  Constitutional: Negative for diaphoresis, fatigue and unexpected weight change.  HENT: Negative.   Eyes: Negative for visual disturbance.  Respiratory: Negative for cough, chest tightness, shortness of breath and wheezing.   Cardiovascular: Negative for chest pain, palpitations and leg swelling.  Gastrointestinal: Negative for abdominal pain, constipation, diarrhea, nausea and vomiting.  Endocrine: Negative.   Genitourinary: Negative.  Negative for difficulty urinating.  Musculoskeletal: Negative.  Negative for myalgias.  Skin: Negative.  Negative for color change.  Neurological: Negative.  Negative for dizziness, weakness and light-headedness.  Hematological: Negative for adenopathy. Does not bruise/bleed easily.  Psychiatric/Behavioral: Positive for behavioral problems, confusion, decreased concentration and sleep disturbance. Negative for suicidal ideas. The patient is not nervous/anxious.     Objective:  BP 120/66 (BP Location: Left Arm, Patient Position: Sitting, Cuff Size: Normal)   Pulse 72   Temp 98.6 F (37 C) (Oral)   Ht 5' (1.524 m)   Wt 137 lb (62.1 kg)   SpO2 93%   BMI 26.76 kg/m   BP Readings from Last 3 Encounters:  07/31/18 120/66  04/15/18 128/60  03/18/18 130/62    Wt Readings from Last 3 Encounters:  07/31/18 137 lb (62.1 kg)  04/15/18 139 lb (63 kg)  02/14/18 145 lb 4 oz (65.9 kg)    Physical Exam Constitutional:      General: She is not in acute distress.     Appearance: She is ill-appearing (thin). She is not toxic-appearing or diaphoretic.  HENT:     Nose: Nose normal.     Mouth/Throat:     Mouth: Mucous membranes are moist.  Eyes:     General: No scleral icterus.    Conjunctiva/sclera: Conjunctivae normal.  Neck:     Musculoskeletal: Normal range of motion. No neck rigidity.  Cardiovascular:     Rate and Rhythm: Normal rate and regular rhythm.     Heart sounds: No murmur. No gallop.   Pulmonary:     Effort: Pulmonary effort is normal.     Breath sounds: No stridor. No wheezing, rhonchi or rales.  Abdominal:     General: Abdomen is flat. There is no distension.     Palpations: There is no hepatomegaly or splenomegaly.  Musculoskeletal: Normal range of motion.     Right lower leg: No edema.     Left lower leg: No edema.  Lymphadenopathy:     Cervical: No cervical adenopathy.  Skin:    General: Skin is warm and dry.  Neurological:     General: No focal deficit present.     Mental Status: She is alert.  Psychiatric:        Attention and Perception: She is inattentive.        Mood and Affect: Mood normal.        Speech: She is noncommunicative. Speech is delayed and tangential.        Behavior: Behavior normal.        Thought Content: Thought content normal.        Cognition and Memory: Cognition is impaired. Memory is impaired. She exhibits impaired recent memory and impaired remote memory.     Lab Results  Component Value Date   WBC 7.0 03/18/2018   HGB 12.3 03/18/2018   HCT 39.8 03/18/2018   PLT 278 03/18/2018   GLUCOSE 355 (H) 03/18/2018   CHOL 162 08/23/2017   TRIG 137.0 08/23/2017   HDL 39.70 08/23/2017   LDLDIRECT 100.0 07/06/2015   LDLCALC 95 08/23/2017   ALT 8 08/23/2017   AST 9 08/23/2017   NA 134 (L) 03/18/2018   K 4.1 03/18/2018   CL 99 03/18/2018   CREATININE 1.39 (H) 03/18/2018   BUN 24 (H) 03/18/2018   CO2 26 03/18/2018   TSH 2.04 08/23/2017   INR 1.1 (H) 02/25/2015   HGBA1C 9.8 (A) 04/15/2018    MICROALBUR 2.7 (H) 08/23/2017    US Renal  Result Date: 06/19/2018 CLINICAL DATA:  Chronic kidney disease. EXAM: RENAL / URINARY TRACT ULTRASOUND COMPLETE COMPARISON:  CT AP 10/13/2008 FINDINGS: Right Kidney: Renal measurements: 9.1 x 4.7 x 4.7 cm = volume: 104.5 mL . Echogenicity within normal limits. No hydronephrosis. Simple appearing cyst noted within the mid pole region measuring 1.6 x 1.4 x 1.4 cm.  Left Kidney: Renal measurements: 9.4 x 4.8 by 4.6 cm = volume: 109.8. mL. Echogenicity within normal limits. No mass or hydronephrosis visualized. Bladder: Appears normal for degree of bladder distention. IMPRESSION: 1. No acute findings.  No evidence for obstructive uropathy. 2. Right kidney cysts Electronically Signed   By: Kerby Moors M.D.   On: 06/19/2018 15:14    Assessment & Plan:   Michelle Lopez was seen today for hypertension, hyperlipidemia, diabetes and anemia.  Diagnoses and all orders for this visit:   HTN - Her blood pressure is adequately well controlled.  I will monitor her electrolytes and renal function. -     Basic metabolic panel; Future -     TSH; Future -     Urinalysis, Routine w reflex microscopic; Future  Type 2 diabetes mellitus with diabetic nephropathy, without long-term current use of insulin ([place])- Her blood sugars have not been well controlled due to noncompliance.  I will recheck her A1c today.  I recommended that the patient and her daughter allow a hospice dementia team to help out with this complicated situation. -     Basic metabolic panel; Future -     Hemoglobin A1c; Future -     Microalbumin / creatinine urine ratio; Future -     HM Diabetes Foot Exam  CRI (chronic renal insufficiency), stage 4 (severe) ([place])- She will avoid nephrotoxic agents.  B12 deficiency- Will continue monthly B12 injections. -     CBC with Differential/Platelet; Future -     Folate; Future -     cyanocobalamin ((VITAMIN B-12)) injection 1,000 mcg  Hyperlipidemia with target LDL  less than 70- I will check her FLP to see if she is achieved her LDL goal. -     Lipid panel; Future -     TSH; Future -     Hepatic function panel; Future  Protein-calorie malnutrition, mild (HCC)  Vitamin D deficiency -     VITAMIN D 25 Hydroxy (Vit-D Deficiency, Fractures); Future  Dementia arising in the senium and presenium [facility]) -     Ambulatory referral to [place] referral to Podiatry   I have discontinued Tammi Klippel Suvorexant. I am also having her maintain her aspirin, onetouch ultrasoft, metoprolol tartrate, cetirizine, Contour Next EZ, glucose blood, tamsulosin, losartan-hydrochlorothiazide, Vitamin D (Ergocalciferol), SitaGLIPtin-MetFORMIN HCl, atorvastatin, clopidogrel, Insulin Pen Needle, Glucerna, amLODipine, and Toujeo Max SoloStar. We administered cyanocobalamin.  Meds ordered this encounter  Medications  . cyanocobalamin ((VITAMIN B-12)) injection 1,000 mcg     Follow-up: Return in about 6 months (around 01/30/2019).  Scarlette Calico, MD

## 2018-07-31 NOTE — Telephone Encounter (Signed)
Spoke to [place] - she wanted to make sure that we knew there was a cognitive decline to insure that pt is receiving the right type of service.

## 2018-08-02 ENCOUNTER — Other Ambulatory Visit: Payer: Self-pay | Admitting: Emergency Medicine

## 2018-08-02 DIAGNOSIS — I10 Essential (primary) hypertension: Secondary | ICD-10-CM

## 2018-08-02 NOTE — Progress Notes (Signed)
Pt's daughter called stating pt went to lab on 6/24 and had labs drawn but was told to come back the following day to have labs redrawn. Spoke with lab and pt only needs cbc redrawn due to clotting. Placed orders and advised pt's daughter she could come back to lab and redraw.

## 2018-08-05 ENCOUNTER — Ambulatory Visit (INDEPENDENT_AMBULATORY_CARE_PROVIDER_SITE_OTHER): Payer: Medicare Other | Admitting: Podiatry

## 2018-08-05 ENCOUNTER — Other Ambulatory Visit: Payer: Self-pay

## 2018-08-05 ENCOUNTER — Encounter: Payer: Self-pay | Admitting: Podiatry

## 2018-08-05 ENCOUNTER — Other Ambulatory Visit (INDEPENDENT_AMBULATORY_CARE_PROVIDER_SITE_OTHER): Payer: Medicare Other

## 2018-08-05 DIAGNOSIS — B351 Tinea unguium: Secondary | ICD-10-CM

## 2018-08-05 DIAGNOSIS — I10 Essential (primary) hypertension: Secondary | ICD-10-CM | POA: Diagnosis not present

## 2018-08-05 DIAGNOSIS — E119 Type 2 diabetes mellitus without complications: Secondary | ICD-10-CM

## 2018-08-05 DIAGNOSIS — E1121 Type 2 diabetes mellitus with diabetic nephropathy: Secondary | ICD-10-CM

## 2018-08-05 DIAGNOSIS — M79674 Pain in right toe(s): Secondary | ICD-10-CM | POA: Diagnosis not present

## 2018-08-05 DIAGNOSIS — M79675 Pain in left toe(s): Secondary | ICD-10-CM | POA: Diagnosis not present

## 2018-08-05 LAB — CBC WITH DIFFERENTIAL/PLATELET
Basophils Absolute: 0.1 10*3/uL (ref 0.0–0.1)
Basophils Relative: 1.1 % (ref 0.0–3.0)
Eosinophils Absolute: 0.1 10*3/uL (ref 0.0–0.7)
Eosinophils Relative: 0.9 % (ref 0.0–5.0)
HCT: 39.8 % (ref 36.0–46.0)
Hemoglobin: 12.8 g/dL (ref 12.0–15.0)
Lymphocytes Relative: 35.8 % (ref 12.0–46.0)
Lymphs Abs: 2.7 10*3/uL (ref 0.7–4.0)
MCHC: 32.1 g/dL (ref 30.0–36.0)
MCV: 82.1 fl (ref 78.0–100.0)
Monocytes Absolute: 0.6 10*3/uL (ref 0.1–1.0)
Monocytes Relative: 7.6 % (ref 3.0–12.0)
Neutro Abs: 4.1 10*3/uL (ref 1.4–7.7)
Neutrophils Relative %: 54.6 % (ref 43.0–77.0)
Platelets: 292 10*3/uL (ref 150.0–400.0)
RBC: 4.84 Mil/uL (ref 3.87–5.11)
RDW: 14 % (ref 11.5–15.5)
WBC: 7.4 10*3/uL (ref 4.0–10.5)

## 2018-08-05 NOTE — Patient Instructions (Signed)
Diabetes Mellitus and Foot Care Foot care is an important part of your health, especially when you have diabetes. Diabetes may cause you to have problems because of poor blood flow (circulation) to your feet and legs, which can cause your skin to:  Become thinner and drier.  Break more easily.  Heal more slowly.  Peel and crack. You may also have nerve damage (neuropathy) in your legs and feet, causing decreased feeling in them. This means that you may not notice minor injuries to your feet that could lead to more serious problems. Noticing and addressing any potential problems early is the best way to prevent future foot problems. How to care for your feet Foot hygiene  Wash your feet daily with warm water and mild soap. Do not use hot water. Then, pat your feet and the areas between your toes until they are completely dry. Do not soak your feet as this can dry your skin.  Trim your toenails straight across. Do not dig under them or around the cuticle. File the edges of your nails with an emery board or nail file.  Apply a moisturizing lotion or petroleum jelly to the skin on your feet and to dry, brittle toenails. Use lotion that does not contain alcohol and is unscented. Do not apply lotion between your toes. Shoes and socks  Wear clean socks or stockings every day. Make sure they are not too tight. Do not wear knee-high stockings since they may decrease blood flow to your legs.  Wear shoes that fit properly and have enough cushioning. Always look in your shoes before you put them on to be sure there are no objects inside.  To break in new shoes, wear them for just a few hours a day. This prevents injuries on your feet. Wounds, scrapes, corns, and calluses  Check your feet daily for blisters, cuts, bruises, sores, and redness. If you cannot see the bottom of your feet, use a mirror or ask someone for help.  Do not cut corns or calluses or try to remove them with medicine.  If you  find a minor scrape, cut, or break in the skin on your feet, keep it and the skin around it clean and dry. You may clean these areas with mild soap and water. Do not clean the area with peroxide, alcohol, or iodine.  If you have a wound, scrape, corn, or callus on your foot, look at it several times a day to make sure it is healing and not infected. Check for: ? Redness, swelling, or pain. ? Fluid or blood. ? Warmth. ? Pus or a bad smell. General instructions  Do not cross your legs. This may decrease blood flow to your feet.  Do not use heating pads or hot water bottles on your feet. They may burn your skin. If you have lost feeling in your feet or legs, you may not know this is happening until it is too late.  Protect your feet from hot and cold by wearing shoes, such as at the beach or on hot pavement.  Schedule a complete foot exam at least once a year (annually) or more often if you have foot problems. If you have foot problems, report any cuts, sores, or bruises to your health care provider immediately. Contact a health care provider if:  You have a medical condition that increases your risk of infection and you have any cuts, sores, or bruises on your feet.  You have an injury that is not   healing.  You have redness on your legs or feet.  You feel burning or tingling in your legs or feet.  You have pain or cramps in your legs and feet.  Your legs or feet are numb.  Your feet always feel cold.  You have pain around a toenail. Get help right away if:  You have a wound, scrape, corn, or callus on your foot and: ? You have pain, swelling, or redness that gets worse. ? You have fluid or blood coming from the wound, scrape, corn, or callus. ? Your wound, scrape, corn, or callus feels warm to the touch. ? You have pus or a bad smell coming from the wound, scrape, corn, or callus. ? You have a fever. ? You have a red line going up your leg. Summary  Check your feet every day  for cuts, sores, red spots, swelling, and blisters.  Moisturize feet and legs daily.  Wear shoes that fit properly and have enough cushioning.  If you have foot problems, report any cuts, sores, or bruises to your health care provider immediately.  Schedule a complete foot exam at least once a year (annually) or more often if you have foot problems. This information is not intended to replace advice given to you by your health care provider. Make sure you discuss any questions you have with your health care provider. Document Released: 01/21/2000 Document Revised: 03/07/2017 Document Reviewed: 02/25/2016 Elsevier Patient Education  2020 [employer]  

## 2018-08-10 NOTE — Progress Notes (Signed)
Subjective: Michelle Lopez presents today referred by Janith Lima, MD for diabetic foot evaluation.  Patient relates 20 year history of diabetes.  Patient denies any history of foot wounds.  Patient denies any history of numbness, tingling, burning, pins/needles sensations.  Today, patient c/o of painful, discolored, thick toenails which interfere with daily activities.  Duration greater than Pain is aggravated when wearing enclosed shoe gear.   Past Medical History:  Diagnosis Date  . Cerebrovascular disease, unspecified   . Cocaine abuse, unspecified   . Diabetes mellitus    type 2, uncontrolled  . Diarrhea   . Essential hypertension, benign   . Hyperlipidemia   . Stroke [facility])    x7    Patient Active Problem List   Diagnosis Date Noted  . Overgrown toenails 07/31/2018  . CRI (chronic renal insufficiency), stage 4 (severe) ([place]) 02/15/2018  . Protein-calorie malnutrition, mild ([place]) 10/17/2017  . B12 deficiency 08/23/2017  . Primary osteoarthritis of right hip 04/03/2017  . PAD (peripheral artery disease) ([place]) 07/24/2016  . Vitamin D deficiency 07/06/2015  . Osteopenia, senile 03/03/2014  . 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 Seborrheic dermatitis 03/05/2013  . Dysphagia as late effect of stroke 05/30/2011  . Dementia arising in the senium and presenium ([place]) 05/02/2011  . Routine general medical examination at a health care facility 05/02/2011  . Hyperlipidemia with target LDL less than 70   . Visit for screening mammogram 09/15/2010  . DEPRESSION 06/24/2009  . VERTIGO, CENTRAL 01/14/2009  . Insomnia 01/14/2009  . CVA (cerebral vascular accident) ([place]) 06/11/2008  . DM (diabetes mellitus), type 2 with renal complications ([place]) [id number]  . Essential hypertension, benign 06/05/2007    Past Surgical History:  Procedure Laterality Date  . TOTAL HIP ARTHROPLASTY  1970   Dr. Rodell Perna     Current Outpatient Medications:  .   amLODipine (NORVASC) 5 MG tablet, TAKE 1 TABLET BY MOUTH EVERY DAY, Disp: 90 tablet, Rfl: 1 .  aspirin 81 MG tablet, Take 81 mg by mouth daily.  , Disp: , Rfl:  .  atorvastatin (LIPITOR) 20 MG tablet, TAKE 1 TABLET BY MOUTH EVERY DAY, Disp: 90 tablet, Rfl: 1 .  Blood Glucose Monitoring Suppl (CONTOUR NEXT EZ) w/Device KIT, 1 Act by Does not apply route 2 (two) times daily., Disp: 1 kit, Rfl: 0 .  cetirizine (ZYRTEC) 10 MG tablet, Take 1 tablet (10 mg total) by mouth daily., Disp: 90 tablet, Rfl: 3 .  clopidogrel (PLAVIX) 75 MG tablet, Take 1 tablet (75 mg total) by mouth daily., Disp: 90 tablet, Rfl: 1 .  Glucerna (GLUCERNA) LIQD, Take 1 Can by mouth 2 (two) times daily between meals., Disp: 60 Can, Rfl: 5 .  glucose blood (ONETOUCH VERIO) test strip, Use to check blood sugars twice a day, Disp: 300 each, Rfl: 1 .  Insulin Glargine, 2 Unit Dial, (TOUJEO MAX SOLOSTAR) 300 UNIT/ML SOPN, Inject 30 Units into the skin daily., Disp: 6 mL, Rfl: 1 .  Insulin Pen Needle (NOVOFINE) 32G X 6 MM MISC, Inject 1 Act into the skin daily., Disp: 100 each, Rfl: 3 .  Lancets (ONETOUCH ULTRASOFT) lancets, Use to check blood sugars twice a day, Disp: 300 each, Rfl: 1 .  losartan-hydrochlorothiazide (HYZAAR) 100-12.5 MG tablet, TAKE 1 TABLET BY MOUTH EVERY DAY, Disp: 90 tablet, Rfl: 1 .  metoprolol tartrate (LOPRESSOR) 25 MG tablet, Take 0.5 tablets (12.5 mg total) by mouth 2 (two) times daily., Disp: 180 tablet, Rfl: 1 .  SitaGLIPtin-MetFORMIN HCl (JANUMET XR) 50-1000 MG TB24, Take 1 tablet by mouth daily., Disp: 90 tablet, Rfl: 1 .  tamsulosin (FLOMAX) 0.4 MG CAPS capsule, TAKE 1 CAPSULE (0.4 MG TOTAL) BY MOUTH DAILY., Disp: 90 capsule, Rfl: 1 .  Vitamin D, Ergocalciferol, (DRISDOL) 1.25 MG (50000 UT) CAPS capsule, Take 1 capsule (50,000 Units total) by mouth every 7 (seven) days. TAKE 1 CAPSULE BY MOUTH EVERY 7 DAYS, Disp: 90 capsule, Rfl: 0  Allergies  Allergen Reactions  . Crestor [Rosuvastatin Calcium]      myalgias  . Morphine Hives  . Pravastatin Sodium Rash  . Simvastatin Rash    Social History   Occupational History  . Occupation: Disability  Tobacco Use  . Smoking status: Never Smoker  . Smokeless tobacco: Never Used  Substance and Sexual Activity  . Alcohol use: No    Alcohol/week: 0.0 standard drinks  . Drug use: No  . Sexual activity: Not Currently    Family History  Problem Relation Age of Onset  . Colon cancer Neg Hx     Immunization History  Administered Date(s) Administered  . Influenza Whole 01/14/2009, 11/16/2009  . Influenza, High Dose Seasonal PF 12/14/2014, 11/27/2016, 10/17/2017  . Influenza,inj,Quad PF,6+ Mos 10/31/2013  . Influenza-Unspecified 12/07/2012  . Pneumococcal Conjugate-13 06/23/2014  . Pneumococcal Polysaccharide-23 12/24/2008, 08/23/2017  . Td 11/16/2009  . Zoster 10/03/2012    Review of systems: Positive Findings in bold print.  Constitutional:  chills, fatigue, fever, sweats, weight change Communication: Optometrist, sign Ecologist, hand writing, iPad/Android device Head: headaches, head injury Eyes: changes in vision, eye pain, glaucoma, cataracts, macular degeneration, diplopia, glare,  light sensitivity, eyeglasses or contacts, blindness Ears nose mouth throat: hearing impaired, hearing aids,  ringing in ears, deaf, sign language,  vertigo, nosebleeds,  rhinitis,  cold sores, snoring, swollen glands Cardiovascular: HTN, edema, arrhythmia, pacemaker in place, defibrillator in place, chest pain/tightness, chronic anticoagulation, blood clot, heart failure, MI Peripheral Vascular: leg cramps, varicose veins, blood clots, lymphedema, varicosities Respiratory:  difficulty breathing, denies congestion, SOB, wheezing, cough, emphysema Gastrointestinal: change in appetite or weight, abdominal pain, constipation, diarrhea, nausea, vomiting, vomiting blood, change in bowel habits, abdominal pain, jaundice, rectal bleeding, hemorrhoids,  GERD Genitourinary:  nocturia,  pain on urination, polyuria,  blood in urine, Foley catheter, urinary urgency, ESRD on hemodialysis Musculoskeletal: amputation, cramping, stiff joints, painful joints, decreased joint motion, fractures, OA, gout, hemiplegia, paraplegia, uses cane, wheelchair bound, uses walker, uses rollator Skin: +changes in toenails, color change, dryness, itching, mole changes,  rash, wound(s) Neurological: headaches, numbness in feet, paresthesias in feet, burning in feet, fainting,  seizures, change in speech,  headaches, memory problems/poor historian, cerebral palsy, weakness, paralysis, CVA, TIA Endocrine: diabetes, hypothyroidism, hyperthyroidism,  goiter, dry mouth, flushing, heat intolerance,  cold intolerance,  excessive thirst, denies polyuria,  nocturia Hematological:  easy bleeding, excessive bleeding, easy bruising, enlarged lymph nodes, on long term blood thinner, history of past transusions Allergy/immunological:  hives, eczema, frequent infections, multiple drug allergies, seasonal allergies, transplant recipient, multiple food allergies Psychiatric:  anxiety, depression, mood disorder, suicidal ideations, hallucinations, insomnia  Objective: There were no vitals filed for this visit. Vascular Examination: Capillary refill time immediate x 10 digits  Dorsalis pedis pulses palpable b/l.  Posterior tibial pulses palpable b/l.  Digital hair sparse x 10 digits.  Skin temperature gradient WNL b/l.  Dermatological Examination: Skin with normal turgor, texture and tone b/l.  Toenails 1-5 b/l discolored, thick, dystrophic with subungual debris and pain with palpation to nailbeds due to  thickness of nails.  Musculoskeletal: Muscle strength 5/5 to all LE muscle groups.  HAV with bunion b/l LE.  Hammertoes digits 2, 3 b/l.  Neurological: Sensation intact with 10 gram monofilament  Vibratory sensation intact.  Assessment: 1. Painful onychomycosis  toenails 1-5 b/l  2. Encounter for diabetic foot exam 3. HAV with bunion b/l 4. Hammertoes 2, 3 b/l 5. NIDDM  Plan: 1. Discussed diabetic foot care principles. Literature dispensed on today. 2. Toenails 1-5 b/l were debrided in length and girth without iatrogenic bleeding. 3. Patient to continue soft, supportive shoe gear 4. Patient to report any pedal injuries to medical professional immediately. 5. Follow up 3 months.  6. Patient/POA to call should there be a concern in the interim.

## 2018-08-15 ENCOUNTER — Ambulatory Visit (INDEPENDENT_AMBULATORY_CARE_PROVIDER_SITE_OTHER): Payer: Medicare Other | Admitting: Internal Medicine

## 2018-08-15 ENCOUNTER — Other Ambulatory Visit: Payer: Self-pay

## 2018-08-15 ENCOUNTER — Encounter: Payer: Self-pay | Admitting: Internal Medicine

## 2018-08-15 VITALS — BP 124/64 | HR 67 | Temp 97.9°F | Resp 16 | Ht 60.0 in | Wt 138.0 lb

## 2018-08-15 DIAGNOSIS — I10 Essential (primary) hypertension: Secondary | ICD-10-CM

## 2018-08-15 DIAGNOSIS — E1121 Type 2 diabetes mellitus with diabetic nephropathy: Secondary | ICD-10-CM | POA: Diagnosis not present

## 2018-08-15 DIAGNOSIS — L602 Onychogryphosis: Secondary | ICD-10-CM

## 2018-08-15 DIAGNOSIS — I6300 Cerebral infarction due to thrombosis of unspecified precerebral artery: Secondary | ICD-10-CM | POA: Diagnosis not present

## 2018-08-15 DIAGNOSIS — E538 Deficiency of other specified B group vitamins: Secondary | ICD-10-CM

## 2018-08-15 LAB — POCT GLYCOSYLATED HEMOGLOBIN (HGB A1C): Hemoglobin A1C: 6.2 % — AB (ref 4.0–5.6)

## 2018-08-15 MED ORDER — CYANOCOBALAMIN 1000 MCG/ML IJ SOLN
1000.0000 ug | Freq: Once | INTRAMUSCULAR | Status: AC
  Administered 2018-08-15: 11:00:00 1000 ug via INTRAMUSCULAR

## 2018-08-15 NOTE — Progress Notes (Signed)
Subjective:  Patient ID: [name], female    DOB: 11-17-1945  Age: 73 y.o. MRN: [id number]  CC: Hypertension and Diabetes   HPI Khamiya Varin presents for f/up - Her daughter tells me that she is doing pretty well.  She has recently been compliant with the basal insulin, metformin, and the DPP4 inhibitor.  They have not been monitoring her blood pressure or blood sugar.  She has an appointment soon with nephrology about her declining renal function.  Outpatient Medications Prior to Visit  Medication Sig Dispense Refill   amLODipine (NORVASC) 5 MG tablet TAKE 1 TABLET BY MOUTH EVERY DAY 90 tablet 1   aspirin 81 MG tablet Take 81 mg by mouth daily.       atorvastatin (LIPITOR) 20 MG tablet TAKE 1 TABLET BY MOUTH EVERY DAY 90 tablet 1   Blood Glucose Monitoring Suppl (CONTOUR NEXT EZ) w/Device KIT 1 Act by Does not apply route 2 (two) times daily. 1 kit 0   cetirizine (ZYRTEC) 10 MG tablet Take 1 tablet (10 mg total) by mouth daily. 90 tablet 3   clopidogrel (PLAVIX) 75 MG tablet Take 1 tablet (75 mg total) by mouth daily. 90 tablet 1   Glucerna (GLUCERNA) LIQD Take 1 Can by mouth 2 (two) times daily between meals. 60 Can 5   glucose blood (ONETOUCH VERIO) test strip Use to check blood sugars twice a day 300 each 1   Insulin Glargine, 2 Unit Dial, (TOUJEO MAX SOLOSTAR) 300 UNIT/ML SOPN Inject 30 Units into the skin daily. 6 mL 1   Insulin Pen Needle (NOVOFINE) 32G X 6 MM MISC Inject 1 Act into the skin daily. 100 each 3   Lancets (ONETOUCH ULTRASOFT) lancets Use to check blood sugars twice a day 300 each 1   losartan-hydrochlorothiazide (HYZAAR) 100-12.5 MG tablet TAKE 1 TABLET BY MOUTH EVERY DAY 90 tablet 1   metoprolol tartrate (LOPRESSOR) 25 MG tablet Take 0.5 tablets (12.5 mg total) by mouth 2 (two) times daily. 180 tablet 1   tamsulosin (FLOMAX) 0.4 MG CAPS capsule TAKE 1 CAPSULE (0.4 MG TOTAL) BY MOUTH DAILY. 90 capsule 1   Vitamin D, Ergocalciferol, (DRISDOL) 1.25 MG  (50000 UT) CAPS capsule Take 1 capsule (50,000 Units total) by mouth every 7 (seven) days. TAKE 1 CAPSULE BY MOUTH EVERY 7 DAYS 90 capsule 0   SitaGLIPtin-MetFORMIN HCl (JANUMET XR) 50-1000 MG TB24 Take 1 tablet by mouth daily. 90 tablet 1   No facility-administered medications prior to visit.     ROS Review of Systems  Constitutional: Negative for diaphoresis, fatigue and unexpected weight change.  HENT: Negative.   Eyes: Negative for visual disturbance.  Respiratory: Negative.  Negative for cough, chest tightness, shortness of breath and wheezing.   Cardiovascular: Negative for chest pain, palpitations and leg swelling.  Gastrointestinal: Negative for abdominal pain, constipation, diarrhea, nausea and vomiting.  Endocrine: Negative.  Negative for polydipsia, polyphagia and polyuria.  Genitourinary: Negative.  Negative for difficulty urinating.  Musculoskeletal: Negative.  Negative for arthralgias and myalgias.  Skin: Negative.  Negative for color change.  Neurological: Negative.  Negative for dizziness, weakness and light-headedness.  Hematological: Negative for adenopathy. Does not bruise/bleed easily.  Psychiatric/Behavioral: Negative.     Objective:  BP 124/64 (BP Location: Left Arm, Patient Position: Sitting, Cuff Size: Normal)    Pulse 67    Temp 97.9 F (36.6 C) (Oral)    Resp 16    Ht 5' (1.524 m)    Wt 138 lb (62.6  kg)    SpO2 96%    BMI 26.95 kg/m   BP Readings from Last 3 Encounters:  08/15/18 124/64  07/31/18 120/66  04/15/18 128/60    Wt Readings from Last 3 Encounters:  08/15/18 138 lb (62.6 kg)  07/31/18 137 lb (62.1 kg)  04/15/18 139 lb (63 kg)    Physical Exam Vitals signs reviewed.  Constitutional:      Appearance: She is not ill-appearing or diaphoretic.  HENT:     Nose: Nose normal.     Mouth/Throat:     Mouth: Mucous membranes are moist.     Pharynx: No oropharyngeal exudate.  Eyes:     General: No scleral icterus.    Conjunctiva/sclera:  Conjunctivae normal.  Neck:     Musculoskeletal: Normal range of motion. No neck rigidity.  Cardiovascular:     Rate and Rhythm: Normal rate and regular rhythm.     Heart sounds: No murmur.  Pulmonary:     Effort: Pulmonary effort is normal.     Breath sounds: No stridor. No wheezing, rhonchi or rales.  Abdominal:     General: Abdomen is flat. Bowel sounds are normal. There is no distension.     Palpations: There is no hepatomegaly or splenomegaly.     Tenderness: There is no abdominal tenderness.  Musculoskeletal: Normal range of motion.     Right lower leg: No edema.     Left lower leg: No edema.  Lymphadenopathy:     Cervical: No cervical adenopathy.  Skin:    General: Skin is warm and dry.  Neurological:     Mental Status: She is oriented to person, place, and time. Mental status is at baseline.  Psychiatric:        Mood and Affect: Mood normal.        Behavior: Behavior normal.     Lab Results  Component Value Date   WBC 7.4 08/05/2018   HGB 12.8 08/05/2018   HCT 39.8 08/05/2018   PLT 292.0 08/05/2018   GLUCOSE 87 07/31/2018   CHOL 148 07/31/2018   TRIG 112.0 07/31/2018   HDL 38.20 (L) 07/31/2018   LDLDIRECT 100.0 07/06/2015   LDLCALC 88 07/31/2018   ALT 9 07/31/2018   AST 15 07/31/2018   NA 139 07/31/2018   K 3.6 07/31/2018   CL 103 07/31/2018   CREATININE 0.96 07/31/2018   BUN 16 07/31/2018   CO2 24 07/31/2018   TSH 1.30 07/31/2018   INR 1.1 (H) 02/25/2015   HGBA1C 6.2 (A) 08/15/2018   MICROALBUR <0.7 07/31/2018    US Renal  Result Date: 06/19/2018 CLINICAL DATA:  Chronic kidney disease. EXAM: RENAL / URINARY TRACT ULTRASOUND COMPLETE COMPARISON:  CT AP 10/13/2008 FINDINGS: Right Kidney: Renal measurements: 9.1 x 4.7 x 4.7 cm = volume: 104.5 mL . Echogenicity within normal limits. No hydronephrosis. Simple appearing cyst noted within the mid pole region measuring 1.6 x 1.4 x 1.4 cm. Left Kidney: Renal measurements: 9.4 x 4.8 by 4.6 cm = volume: 109.8.  mL. Echogenicity within normal limits. No mass or hydronephrosis visualized. Bladder: Appears normal for degree of bladder distention. IMPRESSION: 1. No acute findings.  No evidence for obstructive uropathy. 2. Right kidney cysts Electronically Signed   By: Kerby Moors M.D.   On: 06/19/2018 15:14    Assessment & Plan:   Anastasha was seen today for hypertension and diabetes.  Diagnoses and all orders for this visit:  Type 2 diabetes mellitus with diabetic nephropathy, without long-term current use  of insulin ([place])- Her A1c is down to 6.2 and she has renal insufficiency.  I am concerned that metformin will worsen her renal function and that the DPP 4 inhibitor may precipitate hypoglycemia so I have asked her to stop taking Janumet.  I recommended that she continue receiving the basal insulin. -     POCT glycosylated hemoglobin (Hb A1C)  Essential hypertension, benign- Her blood pressure is adequately well controlled.  Overgrown toenails  B12 deficiency -     cyanocobalamin ((VITAMIN B-12)) injection 1,000 mcg   I have discontinued Arbie Cookey Filley's SitaGLIPtin-MetFORMIN HCl. I am also having her maintain her aspirin, onetouch ultrasoft, metoprolol tartrate, cetirizine, Contour Next EZ, glucose blood, tamsulosin, losartan-hydrochlorothiazide, Vitamin D (Ergocalciferol), atorvastatin, clopidogrel, Insulin Pen Needle, Glucerna, amLODipine, and Toujeo Max SoloStar. We administered cyanocobalamin.  Meds ordered this encounter  Medications   cyanocobalamin ((VITAMIN B-12)) injection 1,000 mcg     Follow-up: Return in about 4 months (around 12/16/2018).  Scarlette Calico, MD

## 2018-08-15 NOTE — Patient Instructions (Signed)
Type 2 Diabetes Mellitus, Diagnosis, Adult Type 2 diabetes (type 2 diabetes mellitus) is a long-term (chronic) disease. In type 2 diabetes, one or both of these problems may be present:  The pancreas does not make enough of a hormone called insulin.  Cells in the body do not respond properly to insulin that the body makes (insulin resistance). Normally, insulin allows blood sugar (glucose) to enter cells in the body. The cells use glucose for energy. Insulin resistance or lack of insulin causes excess glucose to build up in the blood instead of going into cells. As a result, high blood glucose (hyperglycemia) develops. What increases the risk? The following factors may make you more likely to develop type 2 diabetes:  Having a family member with type 2 diabetes.  Being overweight or obese.  Having an inactive (sedentary) lifestyle.  Having been diagnosed with insulin resistance.  Having a history of prediabetes, gestational diabetes, or polycystic ovary syndrome (PCOS).  Being of American-Indian, African-American, Hispanic/Latino, or Asian/Pacific Islander descent. What are the signs or symptoms? In the early stage of this condition, you may not have symptoms. Symptoms develop slowly and may include:  Increased thirst (polydipsia).  Increased hunger(polyphagia).  Increased urination (polyuria).  Increased urination during the night (nocturia).  Unexplained weight loss.  Frequent infections that keep coming back (recurring).  Fatigue.  Weakness.  Vision changes, such as blurry vision.  Cuts or bruises that are slow to heal.  Tingling or numbness in the hands or feet.  Dark patches on the skin (acanthosis nigricans). How is this diagnosed? This condition is diagnosed based on your symptoms, your medical history, a physical exam, and your blood glucose level. Your blood glucose may be checked with one or more of the following blood tests:  A fasting blood glucose (FBG)  test. You will not be allowed to eat (you will fast) for 8 hours or longer before a blood sample is taken.  A random blood glucose test. This test checks blood glucose at any time of day regardless of when you ate.  An A1c (hemoglobin A1c) blood test. This test provides information about blood glucose control over the previous 2-3 months.  An oral glucose tolerance test (OGTT). This test measures your blood glucose at two times: ? After fasting. This is your baseline blood glucose level. ? Two hours after drinking a beverage that contains glucose. You may be diagnosed with type 2 diabetes if:  Your FBG level is 126 mg/dL (7.0 mmol/L) or higher.  Your random blood glucose level is 200 mg/dL (11.1 mmol/L) or higher.  Your A1c level is 6.5% or higher.  Your OGTT result is higher than 200 mg/dL (11.1 mmol/L). These blood tests may be repeated to confirm your diagnosis. How is this treated? Your treatment may be managed by a specialist called an endocrinologist. Type 2 diabetes may be treated by following instructions from your health care provider about:  Making diet and lifestyle changes. This may include: ? Following an individualized nutrition plan that is developed by a diet and nutrition specialist (registered dietitian). ? Exercising regularly. ? Finding ways to manage stress.  Checking your blood glucose level as often as told.  Taking diabetes medicines or insulin daily. This helps to keep your blood glucose levels in the healthy range. ? If you use insulin, you may need to adjust the dosage depending on how physically active you are and what foods you eat. Your health care provider will tell you how to adjust your dosage.    Taking medicines to help prevent complications from diabetes, such as: ? Aspirin. ? Medicine to lower cholesterol. ? Medicine to control blood pressure. Your health care provider will set individualized treatment goals for you. Your goals will be based on  your age, other medical conditions you have, and how you respond to diabetes treatment. Generally, the goal of treatment is to maintain the following blood glucose levels:  Before meals (preprandial): 80-130 mg/dL (4.4-7.2 mmol/L).  After meals (postprandial): below 180 mg/dL (10 mmol/L).  A1c level: less than 7%. Follow these instructions at home: Questions to ask your health care provider  Consider asking the following questions: ? Do I need to meet with a diabetes educator? ? Where can I find a support group for people with diabetes? ? What equipment will I need to manage my diabetes at home? ? What diabetes medicines do I need, and when should I take them? ? How often do I need to check my blood glucose? ? What number can I call if I have questions? ? When is my next appointment? General instructions  Take over-the-counter and prescription medicines only as told by your health care provider.  Keep all follow-up visits as told by your health care provider. This is important.  For more information about diabetes, visit: ? American Diabetes Association (ADA): www.diabetes.org ? American Association of Diabetes Educators (AADE): www.diabeteseducator.org Contact a health care provider if:  Your blood glucose is at or above 240 mg/dL (13.3 mmol/L) for 2 days in a row.  You have been sick or have had a fever for 2 days or longer, and you are not getting better.  You have any of the following problems for more than 6 hours: ? You cannot eat or drink. ? You have nausea and vomiting. ? You have diarrhea. Get help right away if:  Your blood glucose is lower than 54 mg/dL (3.0 mmol/L).  You become confused or you have trouble thinking clearly.  You have difficulty breathing.  You have moderate or large ketone levels in your urine. Summary  Type 2 diabetes (type 2 diabetes mellitus) is a long-term (chronic) disease. In type 2 diabetes, the pancreas does not make enough of a  hormone called insulin, or cells in the body do not respond properly to insulin that the body makes (insulin resistance).  This condition is treated by making diet and lifestyle changes and taking diabetes medicines or insulin.  Your health care provider will set individualized treatment goals for you. Your goals will be based on your age, other medical conditions you have, and how you respond to diabetes treatment.  Keep all follow-up visits as told by your health care provider. This is important. This information is not intended to replace advice given to you by your health care provider. Make sure you discuss any questions you have with your health care provider. Document Released: 01/23/2005 Document Revised: 03/23/2017 Document Reviewed: 02/26/2015 Elsevier Patient Education  2020 [employer]  

## 2018-08-16 ENCOUNTER — Telehealth: Payer: Self-pay

## 2018-08-16 ENCOUNTER — Other Ambulatory Visit: Payer: Medicare Other | Admitting: Licensed Clinical Social Worker

## 2018-08-16 DIAGNOSIS — Z515 Encounter for palliative care: Secondary | ICD-10-CM | POA: Diagnosis not present

## 2018-08-16 NOTE — Telephone Encounter (Signed)
Copied from [place] [id number]. Topic: General - Other >> Aug 16, 2018 12:00 PM Leward Quan A wrote: Reason for CRM: Doctor Orpah Melter called to speak to Dr Ronnald Ramp or his nurse asking for a call back at Ph# [phone]. Stated that its in reference to the referral to hospice. >> Aug 16, 2018  1:26 PM Cairrikier Dian Queen, CMA wrote: Called Dr. Jenene Slicker - She is not Hospice candidate due to ambulation and other factors. Gave verbal okay for pt to transfer to palliative care.

## 2018-08-19 NOTE — Progress Notes (Signed)
COMMUNITY PALLIATIVE CARE SW NOTE  PATIENT NAME: Michelle Lopez DOB: Sep 12, 1945 MRN: [id number]  PRIMARY CARE PROVIDER: Janith Lima, MD  RESPONSIBLE PARTY:  Acct ID - Guarantor Home Phone Work Phone Relationship Acct Type  [id number] - [place],[id number]  Self P/F     [address], [address], [place], [place] [address]   Due to the COVID-19 crisis, this virtual check-in visit was done via telephone from my office and it was initiated and consent given by thispatientand or family.  PLAN OF CARE and INTERVENTIONS:             1. GOALS OF CARE/ ADVANCE CARE PLANNING:  Goal is for patient to remain in her home.  She is a full code 2. SOCIAL/EMOTIONAL/S  SPIRITUAL ASSESSMENT/ INTERVENTIONS:  SW contacted patient's daughter, [name], and conducted a virtual check-in visit.  She stated her mother lives in her own apartment and conducts all of her own ADLs.  Rosaria Ferries has only her phone number listed in the chart and not the patient's because she said she is the only one that makes all of her mother's appointments.  She reports patient has lost weight.  She is also having difficulty sleeping.  An appointment is scheduled for 11:30 on Wednesday due to [name]'s work schedule. 3. PATIENT/CAREGIVER EDUCATION/ COPING:  Daughter expresses her feelings openly. 4. PERSONAL EMERGENCY PLAN:  Family will contact EMS. 5. COMMUNITY RESOURCES COORDINATION/ HEALTH CARE NAVIGATION:  None. 6. FINANCIAL/LEGAL CONCERNS/INTERVENTIONS:  None identified by daughter.     SOCIAL HX:  Social History   Tobacco Use  . Smoking status: Never Smoker  . Smokeless tobacco: Never Used  Substance Use Topics  . Alcohol use: No    Alcohol/week: 0.0 standard drinks    CODE STATUS:  Full Code  ADVANCED DIRECTIVES: N MOST FORM COMPLETE:  N HOSPICE EDUCATION PROVIDED:  Y  PPS:  Patient's appetite has decreased.  She ambulates independently. Duration of visit and documentation:  30 minutes.      Creola Corn Marquice Uddin,  LCSW

## 2018-08-21 ENCOUNTER — Other Ambulatory Visit: Payer: Medicare Other | Admitting: *Deleted

## 2018-08-21 ENCOUNTER — Other Ambulatory Visit: Payer: Self-pay

## 2018-08-21 ENCOUNTER — Other Ambulatory Visit: Payer: Medicare Other | Admitting: Licensed Clinical Social Worker

## 2018-08-21 DIAGNOSIS — Z515 Encounter for palliative care: Secondary | ICD-10-CM

## 2018-08-21 NOTE — Progress Notes (Signed)
   RESPONSIBLE PARTY:  Acct ID - Guarantor Home Phone Work Phone Relationship Acct Type  [id number] - [place],[phone]  Self P/F     [address], [address], [place],  [address]     PLAN OF CARE and INTERVENTIONS:             1. GOALS OF CARE/ ADVANCE CARE PLANNING:  Goal is for patient to remain in her home.  Patient is a full code. 2. SOCIAL/EMOTIONAL/SPIRITUAL ASSESSMENT/ INTERVENTIONS:  SW and Palliative Care RN, Michelle Lopez, met with patient, her caregiver, and her two daughters, Michelle Lopez and Michelle Lopez.  Patient lives with Michelle Lopez.  Patient was born and raised in [place].  She was a housewife.  Her two middle children are deceased.  Patient has lived in this home for a year.  Patient appeared to have difficulty finding words at times.  It is also difficult for her to use her left arm.  Patient loves to watch Westerns on TV. 3. PATIENT/CAREGIVER EDUCATION/ COPING:  Provided education to patient and her daughters regarding Palliative Care.  Her daughters expressed their feelings openly. 4. PERSONAL EMERGENCY PLAN:  Family will contact patient's MD. 5. COMMUNITY RESOURCES COORDINATION/ HEALTH CARE NAVIGATION:  Patient has a caregiver 8a-3p, 7 days per week. 6. FINANCIAL/LEGAL CONCERNS/INTERVENTIONS:  Patient is on a fixed income.     SOCIAL HX:  Social History   Tobacco Use  . Smoking status: Never Smoker  . Smokeless tobacco: Never Used  Substance Use Topics  . Alcohol use: No    Alcohol/week: 0.0 standard drinks    CODE STATUS:  Full Code ADVANCED DIRECTIVES: N MOST FORM COMPLETE:  N HOSPICE EDUCATION PROVIDED:  Y PPS:  Patient stated her appetite is poor.  She is able to walk independently in her home, but uses a walker or cane outside of her home. Duration of visit and documentation:  75 minutes.      Michelle Corn Jazzalyn Loewenstein, LCSW

## 2018-08-22 ENCOUNTER — Other Ambulatory Visit: Payer: Self-pay

## 2018-08-23 NOTE — Progress Notes (Signed)
COMMUNITY PALLIATIVE CARE RN NOTE  PATIENT NAME: Michelle Lopez DOB: [birth year] MRN: [id number]  PRIMARY CARE PROVIDER: Janith Lima, MD  RESPONSIBLE PARTY:  Acct ID - Guarantor Home Phone Work Phone Relationship Acct Type  [id number] - [place],[phone]  Self P/F     [address], [address] [place], [place] [address]   Covid-19 Pre-screening Negative  PLAN OF CARE and INTERVENTION:  1. ADVANCE CARE PLANNING/GOALS OF CARE: Goal is for patient to remain in her home. She is a Full Code. 2. PATIENT/CAREGIVER EDUCATION: Explained Palliative Care Services 3. DISEASE STATUS: Joint visit made with Palliative Care SW, Lynn Duffy. Met with patient and her two daughters. She also has a hired caregiver who was present during the visit. She recently had visit with a Hospice Admissions RN, however did not meet eligibility so was referred to Palliative Care. Patient denies pain during visit. No evidence of dyspnea. Patient is able to answer questions appropriately. She is ambulatory and does not use any assistive devices while in the home, but does utilize a can when travelling outside of the home. She has some left sided weakness in her arm. She is able to perform ADLs independently, but does require minimal assistance with bathing and getting into and out of the tub. Grab bars have been placed in the bathroom to help with safety. Family is going to buy patient a shower bench to make showers possible for patient as she is unable to stand for long periods of time. She has a poor appetite, but does eat 2-3 meals/day. They have limited her sweets d/t it causing elevations in her blood sugar. CBG today prior to breakfast is 95. She is continent of both bowel and bladder. She does say that she gets bored sitting at home, as prior to Covid she often went out shopping with her caregiver. She is unable to read due to decreased vision. Family is agreeable with Palliative Care Services. Will continue to  monitor.  HISTORY OF PRESENT ILLNESS:  This is a 73 yo female who resides at home. Her daughter is currently staying with patient to assist with care and household chores. Palliative Care Team was asked to follow patient for added support. Will visit patient monthly and PRN.  CODE STATUS: Full Code ADVANCED DIRECTIVES: N MOST FORM: no PPS: 50%   PHYSICAL EXAM:   VITALS: Today's Vitals   08/21/18 1215  BP: 135/75  Pulse: 76  Resp: 18  Temp: (!) 97 F (36.1 C)  TempSrc: Temporal  SpO2: 95%  PainSc: 0-No pain    LUNGS: clear to auscultation  CARDIAC: Cor RRR EXTREMITIES: No edema SKIN: Exposed skin is dry and intact  NEURO: Alert and oriented x 3, generalized weakness, ambulatory, decreased vision   (Duration of visit and documentation 90 minutes)    Daryl Eastern, RN BSN

## 2018-08-26 ENCOUNTER — Ambulatory Visit: Payer: Medicare Other

## 2018-08-26 NOTE — Progress Notes (Unsigned)
Subjective:   Michelle Lopez is a 73 y.o. female who presents for Medicare Annual (Subsequent) preventive examination. I connected with patient by a telephone and verified that I am speaking with the correct person using two identifiers. Patient stated full name and DOB. Patient gave permission to continue with telephonic visit. Patient's location was at home and Nurse's location was at [place] office.   Review of Systems:     Sleep patterns: {SX; SLEEP PATTERNS:18802::"feels rested on waking","does not get up to void","gets up *** times nightly to void","sleeps *** hours nightly"}.    Home Safety/Smoke Alarms: Feels safe in home. Smoke alarms in place.  Living environment; residence and Firearm Safety: {Rehab home environment / accessibility:30080::"no firearms","firearms stored safely"}. Seat Belt Safety/Bike Helmet: Wears seat belt.     Objective:     Vitals: There were no vitals taken for this visit.  There is no height or weight on file to calculate BMI.  Advanced Directives 03/18/2018 08/22/2016 08/15/2016 07/07/2015 02/17/2015 03/19/2013 04/20/2011  Does Patient Have a Medical Advance Directive? No Yes Yes Yes No Patient does not have advance directive;Patient would not like information Patient does not have advance directive;Patient would not like information  Type of Advance Directive - Healthcare Power of [place];Living will - - -  Does patient want to make changes to medical advance directive? - No - Patient declined No - Patient declined No - Patient declined - - -  Copy of [place] in Chart? - Yes - Yes - - -  Would patient like information on creating a medical advance directive? No - Patient declined - - - Yes - Scientist, clinical (histocompatibility and immunogenetics) given - -  Pre-existing out of facility DNR order (yellow form or pink MOST form) - - - - - No No    Tobacco Social History   Tobacco Use  Smoking Status Never  Smoker  Smokeless Tobacco Never Used     Counseling given: Not Answered  Past Medical History:  Diagnosis Date  . Cerebrovascular disease, unspecified   . Cocaine abuse, unspecified   . Diabetes mellitus    type 2, uncontrolled  . Diarrhea   . Essential hypertension, benign   . Hyperlipidemia   . Stroke [facility])    x7   Past Surgical History:  Procedure Laterality Date  . TOTAL HIP ARTHROPLASTY  1970   Dr. Rodell Perna   Family History  Problem Relation Age of Onset  . Colon cancer Neg Hx    Social History   Socioeconomic History  . Marital status: Divorced    Spouse name: Not on file  . Number of children: 4  . Years of education: Not on file  . Highest education level: Not on file  Occupational History  . Occupation: Disability  Social Needs  . Financial resource strain: Not on file  . Food insecurity    Worry: Not on file    Inability: Not on file  . Transportation needs    Medical: Not on file    Non-medical: Not on file  Tobacco Use  . Smoking status: Never Smoker  . Smokeless tobacco: Never Used  Substance and Sexual Activity  . Alcohol use: No    Alcohol/week: 0.0 standard drinks  . Drug use: No  . Sexual activity: Not Currently  Lifestyle  . Physical activity    Days per week: Not on file    Minutes per session: Not on file  . Stress:  Not on file  Relationships  . Social Herbalist on phone: Not on file    Gets together: Not on file    Attends religious service: Not on file    Active member of club or organization: Not on file    Attends meetings of clubs or organizations: Not on file    Relationship status: Not on file  Other Topics Concern  . Not on file  Social History Narrative   Daily caffeine     Outpatient Encounter Medications as of 08/26/2018  Medication Sig  . amLODipine (NORVASC) 5 MG tablet TAKE 1 TABLET BY MOUTH EVERY DAY  . aspirin 81 MG tablet Take 81 mg by mouth daily.    Marland Kitchen atorvastatin (LIPITOR) 20 MG tablet TAKE  1 TABLET BY MOUTH EVERY DAY  . Blood Glucose Monitoring Suppl (CONTOUR NEXT EZ) w/Device KIT 1 Act by Does not apply route 2 (two) times daily.  . cetirizine (ZYRTEC) 10 MG tablet Take 1 tablet (10 mg total) by mouth daily.  . clopidogrel (PLAVIX) 75 MG tablet Take 1 tablet (75 mg total) by mouth daily.  Marland Kitchen Glucerna (GLUCERNA) LIQD Take 1 Can by mouth 2 (two) times daily between meals.  Marland Kitchen glucose blood (ONETOUCH VERIO) test strip Use to check blood sugars twice a day  . Insulin Glargine, 2 Unit Dial, (TOUJEO MAX SOLOSTAR) 300 UNIT/ML SOPN Inject 30 Units into the skin daily.  . Insulin Pen Needle (NOVOFINE) 32G X 6 MM MISC Inject 1 Act into the skin daily.  . Lancets (ONETOUCH ULTRASOFT) lancets Use to check blood sugars twice a day  . losartan-hydrochlorothiazide (HYZAAR) 100-12.5 MG tablet TAKE 1 TABLET BY MOUTH EVERY DAY  . metoprolol tartrate (LOPRESSOR) 25 MG tablet Take 0.5 tablets (12.5 mg total) by mouth 2 (two) times daily.  . tamsulosin (FLOMAX) 0.4 MG CAPS capsule TAKE 1 CAPSULE (0.4 MG TOTAL) BY MOUTH DAILY.  Marland Kitchen Vitamin D, Ergocalciferol, (DRISDOL) 1.25 MG (50000 UT) CAPS capsule Take 1 capsule (50,000 Units total) by mouth every 7 (seven) days. TAKE 1 CAPSULE BY MOUTH EVERY 7 DAYS   No facility-administered encounter medications on file as of 08/26/2018.     Activities of Daily Living No flowsheet data found.  Patient Care Team: Janith Lima, MD as PCP - General    Assessment:   This is a routine wellness examination for Michelle Lopez. Physical assessment deferred to PCP.  Exercise Activities and Dietary recommendations   Diet (meal preparation, eat out, water intake, caffeinated beverages, dairy products, fruits and vegetables): {Desc; diets:16563}   Goals   None     Fall Risk Fall Risk  08/01/2018 02/14/2018 08/23/2017 08/22/2016 08/15/2016  Falls in the past year? 0 0 No Yes Yes  Number falls in past yr: 0 0 - 2 or more 2 or more  Injury with Fall? 0 0 - No No  Risk Factor  Category  - - - High Fall Risk High Fall Risk  Risk for fall due to : Impaired balance/gait;Mental status change;Impaired mobility Impaired balance/gait;Impaired mobility;Mental status change - History of fall(s);Impaired balance/gait;Impaired mobility;Mental status change History of fall(s);Impaired balance/gait  Follow up Falls evaluation completed;Education provided;Falls prevention discussed Falls evaluation completed - Education provided;Falls prevention discussed Falls prevention discussed   Is the patient's home free of loose throw rugs in walkways, pet beds, electrical cords, etc?   {Blank single:19197::"yes","no"}      Grab bars in the bathroom? {Blank single:19197::"yes","no"}      Handrails on the  stairs?   {Blank single:19197::"yes","no"}      Adequate lighting?   {Blank single:19197::"yes","no"}  Timed Get Up and Go performed: ***  Depression Screen PHQ 2/9 Scores 08/01/2018 08/22/2016 08/15/2016 07/07/2015  PHQ - 2 Score - 1 1 0  Exception Documentation Medical reason Medical reason - -     Cognitive Function        Immunization History  Administered Date(s) Administered  . Influenza Whole 01/14/2009, 11/16/2009  . Influenza, High Dose Seasonal PF 12/14/2014, 11/27/2016, 10/17/2017  . Influenza,inj,Quad PF,6+ Mos 10/31/2013  . Influenza-Unspecified 12/07/2012  . Pneumococcal Conjugate-13 06/23/2014  . Pneumococcal Polysaccharide-23 12/24/2008, 08/23/2017  . Td 11/16/2009  . Zoster 10/03/2012   Screening Tests Health Maintenance  Topic Date Due  . INFLUENZA VACCINE  09/07/2018  . HEMOGLOBIN A1C  02/15/2019  . FOOT EXAM  07/31/2019  . TETANUS/TDAP  11/17/2019  . DEXA SCAN  Completed  . Hepatitis C Screening  Completed  . PNA vac Low Risk Adult  Completed         I have personally reviewed and noted the following in the patient's chart:   . Medical and social history . Use of alcohol, tobacco or illicit drugs  . Current medications and supplements .  Functional ability and status . Nutritional status . Physical activity . Advanced directives . List of other physicians . Screenings to include cognitive, depression, and falls . Referrals and appointments  In addition, I have reviewed and discussed with patient certain preventive protocols, quality metrics, and best practice recommendations. A written personalized care plan for preventive services as well as general preventive health recommendations were provided to patient.     Michiel Cowboy, RN  08/26/2018

## 2018-08-27 ENCOUNTER — Ambulatory Visit (INDEPENDENT_AMBULATORY_CARE_PROVIDER_SITE_OTHER): Payer: Medicare Other | Admitting: *Deleted

## 2018-08-27 ENCOUNTER — Telehealth: Payer: Self-pay | Admitting: *Deleted

## 2018-08-27 DIAGNOSIS — Z0000 Encounter for general adult medical examination without abnormal findings: Secondary | ICD-10-CM

## 2018-08-27 DIAGNOSIS — Z9181 History of falling: Secondary | ICD-10-CM

## 2018-08-27 NOTE — Patient Instructions (Addendum)
If you cannot attend class in person, you can still exercise at home. Video taped versions of AHOY classes are shown on [employer] (GTN) at 8 am and 1 pm Mondays through Fridays. You can also purchase a copy of the AHOY DVD by calling [employer] (GTN) [employer]. GTN is available on [employer] channel 13 with a digital cable box and on [employer] channel 31. GTN is also available on [employer], channel 99. To view GTN, go to channel 99, press OK, select [place], then select GTN to start the channel.  Continue to eat heart healthy diet (full of fruits, vegetables, whole grains, lean protein, water--limit salt, fat, and sugar intake) and increase physical activity as tolerated. Try to eat high protein yogurt, make or buy smoothies, and eat small amounts frequently throughout the day. Increase the amount of water you drink by setting out 2-3 bottles a day to drink and may also drink sugar free Gatorade to stay hydrated.   Protein Content in Foods  Generally, most healthy people need around 50 grams of protein each day. Depending on your overall health, you may need more or less protein in your diet. Talk to your health care provider or dietitian about how much protein you need. See the following list for the protein content of some common foods. High-protein foods High-protein foods contain 4 grams (4 g) or more of protein per serving. They include:  Beef, ground sirloin (cooked) - 3 oz have 24 g of protein.  Cheese (hard) - 1 oz has 7 g of protein.  Chicken breast, boneless and skinless (cooked) - 3 oz have 13.4 g of protein.  Cottage cheese - 1/2 cup has 13.4 g of protein.  Egg - 1 egg has 6 g of protein.  Fish, filet (cooked) - 1 oz has 6-7 g of protein.  Garbanzo beans (canned or cooked) - 1/2 cup has 6-7 g of protein.  Kidney beans (canned or cooked) - 1/2 cup has 6-7 g of protein.  Lamb (cooked) - 3 oz has 24 g of  protein.  Milk - 1 cup (8 oz) has 8 g of protein.  Nuts (peanuts, pistachios, almonds) - 1 oz has 6 g of protein.  Peanut butter - 1 oz has 7-8 g of protein.  Pork tenderloin (cooked) - 3 oz has 18.4 g of protein.  Pumpkin seeds - 1 oz has 8.5 g of protein.  Soybeans (roasted) - 1 oz has 8 g of protein.  Soybeans (cooked) - 1/2 cup has 11 g of protein.  Soy milk - 1 cup (8 oz) has 5-10 g of protein.  Soy or vegetable patty - 1 patty has 11 g of protein.  Sunflower seeds - 1 oz has 5.5 g of protein.  Tofu (firm) - 1/2 cup has 20 g of protein.  Tuna (canned in water) - 3 oz has 20 g of protein.  Yogurt - 6 oz has 8 g of protein. Low-protein foods Low-protein foods contain 3 grams (3 g) or less of protein per serving. They include:  Beets (raw or cooked) - 1/2 cup has 1.5 g of protein.  Bran cereal - 1/2 cup has 2-3 g of protein.  Bread - 1 slice has 2.5 g of protein.  Broccoli (raw or cooked) - 1/2 cup has 2 g of protein.  Collard greens (raw or cooked) - 1/2 cup has 2 g of protein.  Corn (fresh or cooked) - 1/2 cup has 2 g of  protein.  Cream cheese - 1 oz has 2 g of protein.  Creamer (half-and-half) - 1 oz has 1 g of protein.  Flour tortilla - 1 tortilla has 2.5 g of protein  Frozen yogurt - 1/2 cup has 3 g of protein.  Fruit or vegetable juice - 1/2 cup has 1 g of protein.  Green beans (raw or cooked) - 1/2 cup has 1 g of protein.  Green peas (canned) - 1/2 cup has 3.5 g of protein.  Muffins - 1 small muffin (2 oz) has 3 g of protein.  Oatmeal (cooked) - 1/2 cup has 3 g of protein.  Potato (baked with skin) - 1 medium potato has 3 g of protein.  Rice (cooked) - 1/2 cup has 2.5-3.5 g of protein.  Sour cream - 1/2 cup has 2.5 g of protein.  Spinach (cooked) - 1/2 cup has 3 g of protein.  Squash (cooked) - 1/2 cup has 1.5 g of protein. Actual amounts of protein may be different depending on processing. Talk with your health care provider or dietitian  about what foods are recommended for you. This information is not intended to replace advice given to you by your health care provider. Make sure you discuss any questions you have with your health care provider. Document Released: 04/24/2015 Document Revised: 10/04/2015 Document Reviewed: 10/04/2015 Elsevier Patient Education  2020 [place] 65 Years and Older, Female Preventive care refers to lifestyle choices and visits with your health care provider that can promote health and wellness. This includes:  A yearly physical exam. This is also called an annual well check.  Regular dental and eye exams.  Immunizations.  Screening for certain conditions.  Healthy lifestyle choices, such as diet and exercise. What can I expect for my preventive care visit? Physical exam Your health care provider will check:  Height and weight. These may be used to calculate body mass index (BMI), which is a measurement that tells if you are at a healthy weight.  Heart rate and blood pressure.  Your skin for abnormal spots. Counseling Your health care provider may ask you questions about:  Alcohol, tobacco, and drug use.  Emotional well-being.  Home and relationship well-being.  Sexual activity.  Eating habits.  History of falls.  Memory and ability to understand (cognition).  Work and work Statistician.  Pregnancy and menstrual history. What immunizations do I need?  Influenza (flu) vaccine  This is recommended every year. Tetanus, diphtheria, and pertussis (Tdap) vaccine  You may need a Td booster every 10 years. Varicella (chickenpox) vaccine  You may need this vaccine if you have not already been vaccinated. Zoster (shingles) vaccine  You may need this after age 59. Pneumococcal conjugate (PCV13) vaccine  One dose is recommended after age 58. Pneumococcal polysaccharide (PPSV23) vaccine  One dose is recommended after age 49. Measles, mumps, and  rubella (MMR) vaccine  You may need at least one dose of MMR if you were born in 1957 or later. You may also need a second dose. Meningococcal conjugate (MenACWY) vaccine  You may need this if you have certain conditions. Hepatitis A vaccine  You may need this if you have certain conditions or if you travel or work in places where you may be exposed to hepatitis A. Hepatitis B vaccine  You may need this if you have certain conditions or if you travel or work in places where you may be exposed to hepatitis B. Haemophilus influenzae type b (Hib) vaccine  You may need this if you have certain conditions. You may receive vaccines as individual doses or as more than one vaccine together in one shot (combination vaccines). Talk with your health care provider about the risks and benefits of combination vaccines. What tests do I need? Blood tests  Lipid and cholesterol levels. These may be checked every 5 years, or more frequently depending on your overall health.  Hepatitis C test.  Hepatitis B test. Screening  Lung cancer screening. You may have this screening every year starting at age 24 if you have a 30-pack-year history of smoking and currently smoke or have quit within the past 15 years.  Colorectal cancer screening. All adults should have this screening starting at age 37 and continuing until age 66. Your health care provider may recommend screening at age 50 if you are at increased risk. You will have tests every 1-10 years, depending on your results and the type of screening test.  Diabetes screening. This is done by checking your blood sugar (glucose) after you have not eaten for a while (fasting). You may have this done every 1-3 years.  Mammogram. This may be done every 1-2 years. Talk with your health care provider about how often you should have regular mammograms.  BRCA-related cancer screening. This may be done if you have a family history of breast, ovarian, tubal, or  peritoneal cancers. Other tests  Sexually transmitted disease (STD) testing.  Bone density scan. This is done to screen for osteoporosis. You may have this done starting at age 40. Follow these instructions at home: Eating and drinking  Eat a diet that includes fresh fruits and vegetables, whole grains, lean protein, and low-fat dairy products. Limit your intake of foods with high amounts of sugar, saturated fats, and salt.  Take vitamin and mineral supplements as recommended by your health care provider.  Do not drink alcohol if your health care provider tells you not to drink.  If you drink alcohol: ? Limit how much you have to 0-1 drink a day. ? Be aware of how much alcohol is in your drink. In the [place] one drink equals one 12 oz bottle of beer (355 mL), one 5 oz glass of Ariyonna Twichell (148 mL), or one 1 oz glass of hard liquor (44 mL). Lifestyle  Take daily care of your teeth and gums.  Stay active. Exercise for at least 30 minutes on 5 or more days each week.  Do not use any products that contain nicotine or tobacco, such as cigarettes, e-cigarettes, and chewing tobacco. If you need help quitting, ask your health care provider.  If you are sexually active, practice safe sex. Use a condom or other form of protection in order to prevent STIs (sexually transmitted infections).  Talk with your health care provider about taking a low-dose aspirin or statin. What's next?  Go to your health care provider once a year for a well check visit.  Ask your health care provider how often you should have your eyes and teeth checked.  Stay up to date on all vaccines. This information is not intended to replace advice given to you by your health care provider. Make sure you discuss any questions you have with your health care provider. Document Released: 02/19/2015 Document Revised: 01/17/2018 Document Reviewed: 01/17/2018 Elsevier Patient Education  2020 [employer].

## 2018-08-27 NOTE — Telephone Encounter (Signed)
error 

## 2018-08-27 NOTE — Progress Notes (Addendum)
Subjective:   Michelle Lopez is a 73 y.o. female who presents for Medicare Annual (Subsequent) preventive examination. I connected with patient by a telephone and verified that I am speaking with the correct person using two identifiers. Patient stated full name and DOB. Patient gave permission to continue with telephonic visit. Patient's location was at home and Nurse's location was at [place] office.   Review of Systems:   Cardiac Risk Factors include: advanced age (>45mn, >>42women);diabetes mellitus;dyslipidemia;hypertension Sleep patterns: has difficulty falling asleep, gets up 1-2 times nightly to void and sleeps 4-5 hours nightly. Patient reports insomnia issues, discussed recommended sleep tips, education was attached to patient's AVS.  Home Safety/Smoke Alarms: Feels safe in home. Smoke alarms in place.  Living environment; residence and Firearm Safety: 1-story house/ trailer, equipment: CRadio producer Type: SCobband Walkers, Type: RConservation officer, nature Lives with daughter and has an aide that comes M-F to assist with ADLs,  patient needs tub bench  DME, good support system Seat Belt Safety/Bike Helmet: Wears seat belt.      Objective:     Vitals: There were no vitals taken for this visit.  There is no height or weight on file to calculate BMI.  Advanced Directives 08/27/2018 03/18/2018 08/22/2016 08/15/2016 07/07/2015 02/17/2015 03/19/2013  Does Patient Have a Medical Advance Directive? Yes No Yes Yes Yes No Patient does not have advance directive;Patient would not like information  Type of AScientist, forensicPower of AColemanLiving will - Healthcare Power of ASeymourLiving will - -  Does patient want to make changes to medical advance directive? - - No - Patient declined No - Patient declined No - Patient declined - -  Copy of [place] Chart? No - copy requested - Yes - Yes - -  Would patient  like information on creating a medical advance directive? - No - Patient declined - - - Yes - Educational materials given -  Pre-existing out of facility DNR order (yellow form or pink MOST form) - - - - - - No    Tobacco Social History   Tobacco Use  Smoking Status Never Smoker  Smokeless Tobacco Never Used     Counseling given: Not Answered  Past Medical History:  Diagnosis Date  . Cerebrovascular disease, unspecified   . Cocaine abuse, unspecified   . Diabetes mellitus    type 2, uncontrolled  . Diarrhea   . Essential hypertension, benign   . Hyperlipidemia   . Stroke ([facility]    x7   Past Surgical History:  Procedure Laterality Date  . TOTAL HIP ARTHROPLASTY  1970   Dr. MRodell Perna  Family History  Problem Relation Age of Onset  . Colon cancer Neg Hx    Social History   Socioeconomic History  . Marital status: Divorced    Spouse name: Not on file  . Number of children: 4  . Years of education: Not on file  . Highest education level: Not on file  Occupational History  . Occupation: Disability  Social Needs  . Financial resource strain: Not very hard  . Food insecurity    Worry: Never true    Inability: Never true  . Transportation needs    Medical: No    Non-medical: No  Tobacco Use  . Smoking status: Never Smoker  . Smokeless tobacco: Never Used  Substance and Sexual Activity  . Alcohol use: No    Alcohol/week: 0.0 standard  drinks  . Drug use: No  . Sexual activity: Not Currently  Lifestyle  . Physical activity    Days per week: 0 days    Minutes per session: 0 min  . Stress: Not on file  Relationships  . Social connections    Talks on phone: More than three times a week    Gets together: More than three times a week    Attends religious service: 1 to 4 times per year    Active member of club or organization: Not on file    Attends meetings of clubs or organizations: Not on file    Relationship status: Divorced  Other Topics Concern  . Not on  file  Social History Narrative   Daily caffeine     Outpatient Encounter Medications as of 08/27/2018  Medication Sig  . amLODipine (NORVASC) 5 MG tablet TAKE 1 TABLET BY MOUTH EVERY DAY  . aspirin 81 MG tablet Take 81 mg by mouth daily.    Marland Kitchen atorvastatin (LIPITOR) 20 MG tablet TAKE 1 TABLET BY MOUTH EVERY DAY  . Blood Glucose Monitoring Suppl (CONTOUR NEXT EZ) w/Device KIT 1 Act by Does not apply route 2 (two) times daily.  . cetirizine (ZYRTEC) 10 MG tablet Take 1 tablet (10 mg total) by mouth daily.  . clopidogrel (PLAVIX) 75 MG tablet Take 1 tablet (75 mg total) by mouth daily.  Marland Kitchen glucose blood (ONETOUCH VERIO) test strip Use to check blood sugars twice a day  . Insulin Glargine, 2 Unit Dial, (TOUJEO MAX SOLOSTAR) 300 UNIT/ML SOPN Inject 30 Units into the skin daily.  . Insulin Pen Needle (NOVOFINE) 32G X 6 MM MISC Inject 1 Act into the skin daily.  . Lancets (ONETOUCH ULTRASOFT) lancets Use to check blood sugars twice a day  . losartan-hydrochlorothiazide (HYZAAR) 100-12.5 MG tablet TAKE 1 TABLET BY MOUTH EVERY DAY  . metoprolol tartrate (LOPRESSOR) 25 MG tablet Take 0.5 tablets (12.5 mg total) by mouth 2 (two) times daily.  . tamsulosin (FLOMAX) 0.4 MG CAPS capsule TAKE 1 CAPSULE (0.4 MG TOTAL) BY MOUTH DAILY.  Marland Kitchen Vitamin D, Ergocalciferol, (DRISDOL) 1.25 MG (50000 UT) CAPS capsule Take 1 capsule (50,000 Units total) by mouth every 7 (seven) days. TAKE 1 CAPSULE BY MOUTH EVERY 7 DAYS  . Glucerna (GLUCERNA) LIQD Take 1 Can by mouth 2 (two) times daily between meals. (Patient not taking: Reported on 08/27/2018)   No facility-administered encounter medications on file as of 08/27/2018.     Activities of Daily Living In your present state of health, do you have any difficulty performing the following activities: 08/27/2018  Hearing? N  Vision? N  Difficulty concentrating or making decisions? Y  Walking or climbing stairs? Y  Dressing or bathing? Y  Doing errands, shopping? Y  Preparing  Food and eating ? Y  Using the Toilet? N  In the past six months, have you accidently leaked urine? N  Do you have problems with loss of bowel control? N  Managing your Medications? Y  Managing your Finances? Y  Housekeeping or managing your Housekeeping? Y  Some recent data might be hidden    Patient Care Team: Janith Lima, MD as PCP - General    Assessment:   This is a routine wellness examination for Sayda. Physical assessment deferred to PCP.   Exercise Activities and Dietary recommendations Current Exercise Habits: The patient does not participate in regular exercise at present(chair exercise printouts provided), Exercise limited by: orthopedic condition(s) Diet (meal preparation, eat  out, water intake, caffeinated beverages, dairy products, fruits and vegetables): in general, an "unhealthy" diet. Reports that she eats a lot of "junk food" and has poor appetite. Does not drink supplements such as Glucerna due to not liking the taste.  Reviewed heart healthy and diabetic diet. Discussed eating small frequent meals with high protein such as yogurt and smoothies. Encouraged patient to increase daily water and healthy fluid intake. Diet education was attached to patient's AVS.  Goals   None     Fall Risk Fall Risk  08/27/2018 08/01/2018 02/14/2018 08/23/2017 08/22/2016  Falls in the past year? 0 0 0 No Yes  Number falls in past yr: 0 0 0 - 2 or more  Injury with Fall? - 0 0 - No  Risk Factor Category  - - - - High Fall Risk  Risk for fall due to : Mental status change;Impaired balance/gait;Impaired mobility Impaired balance/gait;Mental status change;Impaired mobility Impaired balance/gait;Impaired mobility;Mental status change - History of fall(s);Impaired balance/gait;Impaired mobility;Mental status change  Follow up Falls prevention discussed Falls evaluation completed;Education provided;Falls prevention discussed Falls evaluation completed - Education provided;Falls prevention  discussed    Depression Screen PHQ 2/9 Scores 08/27/2018 08/01/2018 08/22/2016 08/15/2016  PHQ - 2 Score 0 - 1 1  Exception Documentation - Medical reason Medical reason -     Cognitive Function MMSE - Mini Mental State Exam 08/27/2018  Not completed: (No Data)       Unable to perform due to telephonic visit. Patient has been diagnosed with dementia.  Immunization History  Administered Date(s) Administered  . Influenza Whole 01/14/2009, 11/16/2009  . Influenza, High Dose Seasonal PF 12/14/2014, 11/27/2016, 10/17/2017  . Influenza,inj,Quad PF,6+ Mos 10/31/2013  . Influenza-Unspecified 12/07/2012  . Pneumococcal Conjugate-13 06/23/2014  . Pneumococcal Polysaccharide-23 12/24/2008, 08/23/2017  . Td 11/16/2009  . Zoster 10/03/2012   Screening Tests Health Maintenance  Topic Date Due  . INFLUENZA VACCINE  09/07/2018  . HEMOGLOBIN A1C  02/15/2019  . FOOT EXAM  07/31/2019  . TETANUS/TDAP  11/17/2019  . DEXA SCAN  Completed  . Hepatitis C Screening  Completed  . PNA vac Low Risk Adult  Completed      Plan:     An order for tub bench was placed for patient's safety and to maintain independence of ADLs  I have personally reviewed and noted the following in the patient's chart:   . Medical and social history . Use of alcohol, tobacco or illicit drugs  . Current medications and supplements . Functional ability and status . Nutritional status . Physical activity . Advanced directives . List of other physicians . Screenings to include cognitive, depression, and falls . Referrals and appointments  In addition, I have reviewed and discussed with patient certain preventive protocols, quality metrics, and best practice recommendations. A written personalized care plan for preventive services as well as general preventive health recommendations were provided to patient.     Michiel Cowboy, RN  08/27/2018   Medical screening examination/treatment/procedure(s) were performed by  non-physician practitioner and as supervising physician I was immediately available for consultation/collaboration. I agree with above. Scarlette Calico, MD

## 2018-08-27 NOTE — Addendum Note (Signed)
Addended by: Karle Barr on: 08/27/2018 03:55 PM   Modules accepted: Orders

## 2018-09-20 ENCOUNTER — Other Ambulatory Visit: Payer: Medicare Other | Admitting: *Deleted

## 2018-09-20 ENCOUNTER — Other Ambulatory Visit: Payer: Self-pay

## 2018-09-20 DIAGNOSIS — Z515 Encounter for palliative care: Secondary | ICD-10-CM

## 2018-09-20 NOTE — Progress Notes (Signed)
COMMUNITY PALLIATIVE CARE RN NOTE  PATIENT NAME: Michelle Lopez DOB: January 23, 1946 MRN: [id number]  PRIMARY CARE PROVIDER: Janith Lima, MD  RESPONSIBLE PARTY:  Acct ID - Guarantor Home Phone Work Phone Relationship Acct Type  [id number] - [name],[phone]  Self P/F     [address], [place], [place] [address]   Due to the COVID-19 crisis, this virtual check-in visit was done via telephone from my office and it was initiated and consent by this patient and or family.  PLAN OF CARE and INTERVENTION:  1. ADVANCE CARE PLANNING/GOALS OF CARE: Goal is for her to remain in her home. She is a Full code. 2. PATIENT/CAREGIVER EDUCATION: N/A 3. DISEASE STATUS: Virtual check-in visit completed via telephone. Patient denies pain at this time. Daughter, Michelle Lopez, reports that she is noticing some patient confusion/forgetfulness. Recently, she did not remember the caregiver coming over and taking her out for an entire day. Michelle Lopez states that she is going to contact her PCP and ask for a Neurology consult to have some tests run. This is a new finding. She remains ambulatory, but often does not utilize her cane while inside the home. She is sleeping more during the day, and is up much of the night. She is sleeping in until about 11:30a/12:00p. Michelle Lopez is having to encourage her more to engage in activities. She will however get up to sit on the couch to watch TV. They now have a shower bench for patient to use and she is able to shower independently with stand-by assistance, but must be encouraged to do so. Patient continues to say that she does not have much of an appetite, but is eating 2-3 meals/day. Her blood sugars are stable most of the time, however 2 days ago, it was in the 200s. They are monitoring closely what patient eats. Will continue to monitor.  HISTORY OF PRESENT ILLNESS:  This is a 73 yo female who resides in her home. She has a caregiver that comes daily to assist with her personal  care needs. Palliative care team continues to follow patient. Will continue to visit monthly and prn.  CODE STATUS: Full Code ADVANCED DIRECTIVES: N MOST FORM: no PPS: 50%   (Duration of visit and documentation 45 minutes)   Daryl Eastern, RN BSN

## 2018-09-27 ENCOUNTER — Other Ambulatory Visit: Payer: Self-pay | Admitting: Internal Medicine

## 2018-09-27 DIAGNOSIS — E1121 Type 2 diabetes mellitus with diabetic nephropathy: Secondary | ICD-10-CM

## 2018-09-27 DIAGNOSIS — I10 Essential (primary) hypertension: Secondary | ICD-10-CM

## 2018-10-10 ENCOUNTER — Telehealth: Payer: Self-pay | Admitting: Licensed Clinical Social Worker

## 2018-10-10 NOTE — Telephone Encounter (Signed)
Palliative Care SW left a vm for patient's daughter, [name], to schedule a visit with patient.

## 2018-10-17 ENCOUNTER — Encounter: Payer: Self-pay | Admitting: Endocrinology

## 2018-10-17 NOTE — Progress Notes (Deleted)
e

## 2018-10-18 ENCOUNTER — Ambulatory Visit: Payer: Medicare Other | Admitting: Endocrinology

## 2018-11-06 ENCOUNTER — Telehealth: Payer: Self-pay | Admitting: Licensed Clinical Social Worker

## 2018-11-06 ENCOUNTER — Other Ambulatory Visit: Payer: Self-pay | Admitting: Internal Medicine

## 2018-11-06 DIAGNOSIS — I10 Essential (primary) hypertension: Secondary | ICD-10-CM

## 2018-11-06 DIAGNOSIS — I739 Peripheral vascular disease, unspecified: Secondary | ICD-10-CM

## 2018-11-06 NOTE — Telephone Encounter (Signed)
Palliative Care SW left a vm for patient's daughter, Rosaria Ferries, to schedule a visit.

## 2018-11-08 ENCOUNTER — Ambulatory Visit: Payer: Medicare Other | Admitting: Podiatry

## 2018-11-21 ENCOUNTER — Other Ambulatory Visit: Payer: Self-pay

## 2018-11-21 ENCOUNTER — Emergency Department (HOSPITAL_COMMUNITY)
Admission: EM | Admit: 2018-11-21 | Discharge: 2018-11-21 | Discharge status: Against Medical Advice | Payer: Medicare Other | Attending: Emergency Medicine | Admitting: Emergency Medicine

## 2018-11-21 ENCOUNTER — Encounter (HOSPITAL_COMMUNITY): Payer: Self-pay | Admitting: Emergency Medicine

## 2018-11-21 ENCOUNTER — Encounter (HOSPITAL_COMMUNITY): Payer: Self-pay

## 2018-11-21 ENCOUNTER — Emergency Department (HOSPITAL_COMMUNITY)
Admission: EM | Admit: 2018-11-21 | Discharge: 2018-11-22 | Discharge status: Against Medical Advice | Payer: Medicare Other | Source: Home / Self Care

## 2018-11-21 ENCOUNTER — Ambulatory Visit: Payer: Self-pay | Admitting: *Deleted

## 2018-11-21 DIAGNOSIS — E1065 Type 1 diabetes mellitus with hyperglycemia: Secondary | ICD-10-CM | POA: Diagnosis present

## 2018-11-21 DIAGNOSIS — Z5321 Procedure and treatment not carried out due to patient leaving prior to being seen by health care provider: Secondary | ICD-10-CM | POA: Insufficient documentation

## 2018-11-21 LAB — CBC
HCT: 44 % (ref 36.0–46.0)
Hemoglobin: 13.5 g/dL (ref 12.0–15.0)
MCH: 26 pg (ref 26.0–34.0)
MCHC: 30.7 g/dL (ref 30.0–36.0)
MCV: 84.6 fL (ref 80.0–100.0)
Platelets: 230 10*3/uL (ref 150–400)
RBC: 5.2 MIL/uL — ABNORMAL HIGH (ref 3.87–5.11)
RDW: 12.4 % (ref 11.5–15.5)
WBC: 7.1 10*3/uL (ref 4.0–10.5)
nRBC: 0 % (ref 0.0–0.2)

## 2018-11-21 LAB — BASIC METABOLIC PANEL
Anion gap: 11 (ref 5–15)
BUN: 30 mg/dL — ABNORMAL HIGH (ref 8–23)
CO2: 24 mmol/L (ref 22–32)
Calcium: 9.1 mg/dL (ref 8.9–10.3)
Chloride: 96 mmol/L — ABNORMAL LOW (ref 98–111)
Creatinine, Ser: 1.44 mg/dL — ABNORMAL HIGH (ref 0.44–1.00)
GFR calc Af Amer: 41 mL/min — ABNORMAL LOW (ref >60)
GFR calc non Af Amer: 35 mL/min — ABNORMAL LOW (ref >60)
Glucose, Bld: 455 mg/dL — ABNORMAL HIGH (ref 70–99)
Potassium: 4.1 mmol/L (ref 3.5–5.1)
Sodium: 131 mmol/L — ABNORMAL LOW (ref 135–145)

## 2018-11-21 LAB — CBG MONITORING, ED
Glucose-Capillary: 441 mg/dL — ABNORMAL HIGH (ref 70–99)
Glucose-Capillary: 355 mg/dL — ABNORMAL HIGH (ref 70–99)

## 2018-11-21 MED ORDER — SODIUM CHLORIDE 0.9 % IV SOLN: INTRAVENOUS | Status: DC

## 2018-11-21 MED ORDER — SODIUM CHLORIDE 0.9 % IV BOLUS: 500.0000 mL | Freq: Once | INTRAVENOUS | Status: DC

## 2018-11-21 NOTE — Telephone Encounter (Signed)
Patient's caregiver and daughter, Geet calls. cbg fasting this morning was 346. Now 531. Instructed caregiver to administer her daily dose of Toujeo. Directions are to inject 30 units daily. Using a 2 Unit Dial, 300 Unit/Ml SOPN. I am unsure of exact dosage the patient has been receiving daily due to the family/caregive confusion of the solostar dosing. Stated she feels dizzy constantly and increased urine output lately. Stated yesterday's cbgs were 349 and over 500 last night. Caregiver reports patient's cbg have been over 300 since working with the patient beginning 11/11/18.No oral agent noticed on the patient's medication profile. Instructed the family the patient needs treatment at the ED at this time. Daughter will take her and her medications now.   Reason for Disposition . Blood glucose > 500 mg/dL (27.8 mmol/L)  Answer Assessment - Initial Assessment Questions 1. BLOOD GLUCOSE: "What is your blood glucose level?"      346 cbg fasting this morning.  2. ONSET: "When did you check the blood glucose?"     When she wakes up.  3. USUAL RANGE: "What is your glucose level usually?" (e.g., usual fasting morning value, usual evening value)     unsure 4. KETONES: "Do you check for ketones (urine or blood test strips)?" If yes, ask: "What does the test show now?"     no 5. TYPE 1 or 2:  "Do you know what type of diabetes you have?"  (e.g., Type 1, Type 2, Gestational; doesn't know)     Type 2 6. INSULIN: "Do you take insulin?" "What type of insulin(s) do you use? What is the mode of delivery? (syringe, pen; injection or pump)?"     Yes when she eats. 7. DIABETES PILLS: "Do you take any pills for your diabetes?" If yes, ask: "Have you missed taking any pills recently?"   yes 8. OTHER SYMPTOMS: "Do you have any symptoms?" (e.g., fever, frequent urination, difficulty breathing, dizziness, weakness, vomiting)     Frequent urination, no fever, dizziness.  9. PREGNANCY: "Is there any chance you are  pregnant?" "When was your last menstrual period?"     na  Protocols used: DIABETES - HIGH BLOOD SUGAR-A-AH

## 2018-11-21 NOTE — ED Notes (Signed)
Pt's daughter told staff "this wait is ridiculous. [facility]. She has just been sitting here doing nothing"

## 2018-11-21 NOTE — ED Triage Notes (Signed)
Per daughter, states CBG's have been running high for the past 2 weeks-states she is type 1-takes PO and insulin-states increased urination-PCP told her to come to ED

## 2018-11-21 NOTE — ED Notes (Signed)
Pt's daughter states they are leaving and will take pt to PCP in morning.

## 2018-11-21 NOTE — Telephone Encounter (Signed)
Not our pt

## 2018-11-21 NOTE — Telephone Encounter (Signed)
Routing to [place] at this time.

## 2018-11-21 NOTE — ED Triage Notes (Signed)
Pt just left [facility] ED [facility], for hyperglycemia for the past two weeks. Pt had blood work done at [facility].

## 2018-11-25 NOTE — Progress Notes (Signed)
Subjective:    Patient ID: Michelle Lopez, female    DOB: 1945/07/23, 73 y.o.   MRN: [id number]  HPI The patient is here for an acute visit.  She is here today with her daughter.  Up until a month ago she was living by herself and eating poorly.  She was noncompliant with a diabetic diet.  She had a home health aide that was helping with her medication.  3 months ago her A1c was 6.2%.  Over the past month her sugars have been running high.  She is now living with her daughter and her daughter in a different home health aide are giving her the insulin.  She is eating more healthy.  Up until 5 days ago they were not giving her the insulin correctly and was giving her a very small dose of insulin.  5 days ago they realized they were supposed to be giving her 30 units not 3 units and this was changed.  Her sugars have been varying, but typically have been 200s-400s.  Yesterday was the first time her sugar was in the 200s.  Her daughter has noticed that since they adjusted the insulin and are giving her the correct dose the sugars are starting to come down, but are still very elevated.  There were no other changes at that time and medication or lifestyle.  She is eating healthier than she was previously.  Her daughter is trying to get her exercise a little.     She and her daughter denies any symptoms suggestive of an infection.     Medications and allergies reviewed with patient and updated if appropriate.  Patient Active Problem List   Diagnosis Date Noted  . Overgrown toenails 07/31/2018  . CRI (chronic renal insufficiency), stage 4 (severe) ([place]) 02/15/2018  . Protein-calorie malnutrition, mild ([place]) 10/17/2017  . B12 deficiency 08/23/2017  . Primary osteoarthritis of right hip 04/03/2017  . PAD (peripheral artery disease) ([place]) 07/24/2016  . Vitamin D deficiency 07/06/2015  . Osteopenia, senile 03/03/2014  . 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 Seborrheic  dermatitis 03/05/2013  . Dysphagia as late effect of stroke 05/30/2011  . Dementia arising in the senium and presenium ([place]) 05/02/2011  . Routine general medical examination at a health care facility 05/02/2011  . Hyperlipidemia with target LDL less than 70   . Visit for screening mammogram 09/15/2010  . DEPRESSION 06/24/2009  . VERTIGO, CENTRAL 01/14/2009  . Insomnia 01/14/2009  . CVA (cerebral vascular accident) ([place]) 06/11/2008  . DM (diabetes mellitus), type 2 with renal complications ([place]) 78/29/5621  . Essential hypertension, benign 06/05/2007    Current Outpatient Medications on File Prior to Visit  Medication Sig Dispense Refill  . amLODipine (NORVASC) 5 MG tablet TAKE 1 TABLET BY MOUTH EVERY DAY 90 tablet 1  . aspirin 81 MG tablet Take 81 mg by mouth daily.      Marland Kitchen atorvastatin (LIPITOR) 20 MG tablet TAKE 1 TABLET BY MOUTH EVERY DAY 90 tablet 1  . Blood Glucose Monitoring Suppl (CONTOUR NEXT EZ) w/Device KIT 1 Act by Does not apply route 2 (two) times daily. 1 kit 0  . cetirizine (ZYRTEC) 10 MG tablet Take 1 tablet (10 mg total) by mouth daily. 90 tablet 3  . clopidogrel (PLAVIX) 75 MG tablet TAKE 1 TABLET BY MOUTH EVERY DAY 90 tablet 1  . Glucerna (GLUCERNA) LIQD Take 1 Can by mouth 2 (two) times daily between meals. 60 Can 5  .  glucose blood (ONETOUCH VERIO) test strip Use to check blood sugars twice a day 300 each 1  . Insulin Glargine, 2 Unit Dial, (TOUJEO MAX SOLOSTAR) 300 UNIT/ML SOPN Inject 30 Units into the skin daily. 6 mL 1  . Insulin Pen Needle (NOVOFINE) 32G X 6 MM MISC Inject 1 Act into the skin daily. 100 each 3  . Lancets (ONETOUCH ULTRASOFT) lancets Use to check blood sugars twice a day 300 each 1  . losartan-hydrochlorothiazide (HYZAAR) 100-12.5 MG tablet TAKE 1 TABLET BY MOUTH EVERY DAY 90 tablet 1  . metoprolol tartrate (LOPRESSOR) 25 MG tablet TAKE 1/2 TABLET TWICE A DAY 90 tablet 1  . tamsulosin (FLOMAX) 0.4 MG CAPS capsule TAKE 1 CAPSULE BY MOUTH EVERY DAY  90 capsule 1  . Vitamin D, Ergocalciferol, (DRISDOL) 1.25 MG (50000 UT) CAPS capsule Take 1 capsule (50,000 Units total) by mouth every 7 (seven) days. TAKE 1 CAPSULE BY MOUTH EVERY 7 DAYS 90 capsule 0   No current facility-administered medications on file prior to visit.     Past Medical History:  Diagnosis Date  . Cerebrovascular disease, unspecified   . Cocaine abuse, unspecified   . Diabetes mellitus    type 2, uncontrolled  . Diarrhea   . Essential hypertension, benign   . Hyperlipidemia   . Stroke ([facility])    x7    Past Surgical History:  Procedure Laterality Date  . TOTAL HIP ARTHROPLASTY  1970   Dr. Mark Yates    Social History   Socioeconomic History  . Marital status: Divorced    Spouse name: Not on file  . Number of children: 4  . Years of education: Not on file  . Highest education level: Not on file  Occupational History  . Occupation: Disability  Social Needs  . Financial resource strain: Not very hard  . Food insecurity    Worry: Never true    Inability: Never true  . Transportation needs    Medical: No    Non-medical: No  Tobacco Use  . Smoking status: Never Smoker  . Smokeless tobacco: Never Used  Substance and Sexual Activity  . Alcohol use: No    Alcohol/week: 0.0 standard drinks  . Drug use: No  . Sexual activity: Not Currently  Lifestyle  . Physical activity    Days per week: 0 days    Minutes per session: 0 min  . Stress: Not on file  Relationships  . Social connections    Talks on phone: More than three times a week    Gets together: More than three times a week    Attends religious service: 1 to 4 times per year    Active member of club or organization: Not on file    Attends meetings of clubs or organizations: Not on file    Relationship status: Divorced  Other Topics Concern  . Not on file  Social History Narrative   Daily caffeine     Family History  Problem Relation Age of Onset  . Colon cancer Neg Hx     Review of  Systems  Constitutional: Negative for chills and fever.  Eyes: Negative for visual disturbance.  Respiratory: Negative for cough, shortness of breath and wheezing.   Cardiovascular: Negative for chest pain, palpitations and leg swelling.  Gastrointestinal: Negative for abdominal pain and nausea.       Some urinary incontinence  Endocrine: Positive for polydipsia and polyuria.  Genitourinary: Negative for dysuria and hematuria.  Neurological: Positive for dizziness.   Negative for headaches.  Psychiatric/Behavioral: Negative for confusion.       Objective:   Vitals:   11/26/18 0923  BP: 124/62  Pulse: 81  Resp: 16  Temp: 98.5 F (36.9 C)  SpO2: 98%   BP Readings from Last 3 Encounters:  11/26/18 124/62  11/21/18 (!) 116/53  08/21/18 135/75   Wt Readings from Last 3 Encounters:  11/26/18 132 lb 12.8 oz (60.2 kg)  08/15/18 138 lb (62.6 kg)  07/31/18 137 lb (62.1 kg)   Body mass index is 25.94 kg/m.   Physical Exam    Constitutional: Appears well-developed and well-nourished. No distress.  HENT:  Head: Normocephalic and atraumatic.  Neck: Neck supple. No tracheal deviation present. No thyromegaly present.  No cervical lymphadenopathy Cardiovascular: Normal rate, regular rhythm and normal heart sounds.   No murmur heard. No carotid bruit .  No edema Pulmonary/Chest: Effort normal and breath sounds normal. No respiratory distress. No has no wheezes. No rales.  Skin: Skin is warm and dry. Not diaphoretic.  Psychiatric: Normal mood and affect. Behavior is normal.       Assessment & Plan:    See Problem List for Assessment and Plan of chronic medical problems.

## 2018-11-26 ENCOUNTER — Encounter: Payer: Self-pay | Admitting: Internal Medicine

## 2018-11-26 ENCOUNTER — Other Ambulatory Visit: Payer: Self-pay

## 2018-11-26 ENCOUNTER — Ambulatory Visit (INDEPENDENT_AMBULATORY_CARE_PROVIDER_SITE_OTHER): Payer: Medicare Other | Admitting: Internal Medicine

## 2018-11-26 VITALS — BP 124/62 | HR 81 | Temp 98.5°F | Resp 16 | Ht 60.0 in | Wt 132.8 lb

## 2018-11-26 DIAGNOSIS — I6300 Cerebral infarction due to thrombosis of unspecified precerebral artery: Secondary | ICD-10-CM

## 2018-11-26 DIAGNOSIS — E1121 Type 2 diabetes mellitus with diabetic nephropathy: Secondary | ICD-10-CM | POA: Diagnosis not present

## 2018-11-26 LAB — GLUCOSE, POCT (MANUAL RESULT ENTRY): POC Glucose: 385 mg/dl — AB (ref 70–99)

## 2018-11-26 MED ORDER — TOUJEO MAX SOLOSTAR 300 UNIT/ML ~~LOC~~ SOPN: 36.0000 [IU] | PEN_INJECTOR | Freq: Every day | SUBCUTANEOUS | 1 refills | Status: DC

## 2018-11-26 NOTE — Addendum Note (Signed)
Addended by: Delice Bison E on: 11/26/2018 04:01 PM   Modules accepted: Orders

## 2018-11-26 NOTE — Patient Instructions (Addendum)
Increase the insulin to 36 units daily.  Call back in two days with her numbers, sooner if concerns.  We will adjust the insulin every couple of days as needed.    Please call with concerns.

## 2018-11-26 NOTE — Assessment & Plan Note (Signed)
Not currently controlled likely from her not getting the correct dose of insulin for most 3 weeks-the past 5 days she has been getting her 30 units A1c 3 months ago on this dose was very good at 6.2% She is eating properly and is doing a little bit of exercise Her sugars will likely come down in time, but we will temporarily increase her insulin slightly to help improve her sugars.  Instructed the daughter that we will need to address this over the next couple of weeks to get her sugars controlled and then we may need to decrease the dose again Increase now to 36 units.  Call in a couple of days with sugar updates we can adjust further Advised to call with any questions or concerns Daughter instructed on how to appropriately give insulin by CMA

## 2018-11-29 ENCOUNTER — Telehealth: Payer: Self-pay | Admitting: *Deleted

## 2018-11-29 NOTE — Telephone Encounter (Signed)
Contacted and spoke with patient's daughter, [name], to arrange a home visit. Visit scheduled for 12/05/18 at 10:00am.

## 2018-12-04 ENCOUNTER — Telehealth: Payer: Self-pay

## 2018-12-04 NOTE — Telephone Encounter (Signed)
Pt is now living with [name] and pt's diet is better. They have noticed some lower  BS than before (when pt was living on her own). Dr. Quay Burow increase pt insulin (Toujeo) to 36 units daily. Pt blood sugar at the lowest was 64.   Please advise if pt should come back in or if a change is needed to her insulin dose.

## 2018-12-04 NOTE — Telephone Encounter (Signed)
Copied from [place] [id number]. Topic: General - Other >> Dec 04, 2018 11:09 AM Jodie Echevaria wrote: Reason for CRM: Patient daughter Nicle Milkowski called to say that when patient was seen on 11/26/2018 by Dr Quay Burow her insulin ( Insulin Glargine, 2 Unit Dial, (TOUJEO MAX SOLOSTAR) 300 UNIT/ML SOPN ) was raised to lower her blood sugar. Per daughter it is working but now on 12/03/2018 patient BS was 64 asking if the need to go back to the previous dose she was on. Please call Geralene Frommer at  Ph#  [phone] with some answers

## 2018-12-05 ENCOUNTER — Other Ambulatory Visit: Payer: Medicare Other | Admitting: *Deleted

## 2018-12-05 ENCOUNTER — Other Ambulatory Visit: Payer: Self-pay

## 2018-12-05 DIAGNOSIS — Z515 Encounter for palliative care: Secondary | ICD-10-CM

## 2018-12-09 NOTE — Progress Notes (Addendum)
COMMUNITY PALLIATIVE CARE RN NOTE  PATIENT NAME: Michelle Lopez DOB: 1945/07/20 MRN: [id number]  PRIMARY CARE PROVIDER: Janith Lima, MD  RESPONSIBLE PARTY: [name] (daughter) Acct ID - Guarantor Home Phone Work Phone Relationship Acct Type  [id number] - [place],[phone]  Self P/F     [address], [place], [place] [address]   Covid-19 Pre-screening Negative  PLAN OF CARE and INTERVENTION:  1. ADVANCE CARE PLANNING/GOALS OF CARE: Goal is for patient to remain in her home and avoid hospitalizations 2. PATIENT/CAREGIVER EDUCATION: Pain Management, Safe Mobility, Diabetes Management 3. DISEASE STATUS: Met with patient, her daughter, and hired caregiver in patient's home. Upon my arrival, she is lying down in bed. Visit completed in her bedroom. She is awake and alert and able to engage in conversation. She says that she dizzy and this occurs often. She says she has just learned how to deal with it. She reports an achy pain in bilateral lower extremities, along with neuropathic pain also. She has started walking daily outside, but says this does increase the pain in her legs some. She ambulates using her cane inside the home and walker outside. She is able to bathe, dress, feed and toilet herself. She does require some assistance with getting out of the bath tub. They have a shower bench, but patient says she does not like using it and prefers taking a bath. She started having issues about 2 weeks ago with elevated blood sugars in the 300-500s. She went to the ED on 11/21/18 and was treated and released. She had a follow up appointment with her PCP on 11/26/18 and insulin was increased to 36 units daily. Her family is keeping a daily log of her blood sugars twice daily. They now range between 70s-170s. Family has also made adjustments in her diet. Her intake has started to improve over the past week. She denies dysphagia and takes her medications without difficulty. Will  continue to monitor.   HISTORY OF PRESENT ILLNESS:  This is a 73 yo female who resides at home. There is family present 24/7 and they are very supportive. Palliative care team continues to follow patient. Will continue to visit patient monthly and PRN.  CODE STATUS: Full Code ADVANCED DIRECTIVES: Y (HCPOA) MOST FORM: no PPS: 50%   PHYSICAL EXAM:   VITALS: Today's Vitals   12/05/18 1018  BP: 130/67  Pulse: 64  Resp: 16  Temp: (!) 97.4 F (36.3 C)  TempSrc: Temporal  SpO2: 96%  PainSc: 2   PainLoc: Leg    LUNGS: clear to auscultation  CARDIAC: Cor RRR EXTREMITIES: No edema SKIN: Skin color, texture, turgor normal. No rashes or lesions  NEURO: Alert and oriented x 3, pleasant mood, ambulatory w/cane and/or walker   (Duration of visit and documentation 60 minutes)   Daryl Eastern, RN BSN

## 2018-12-17 ENCOUNTER — Other Ambulatory Visit: Payer: Self-pay

## 2018-12-17 ENCOUNTER — Ambulatory Visit (INDEPENDENT_AMBULATORY_CARE_PROVIDER_SITE_OTHER): Payer: Medicare Other | Admitting: Internal Medicine

## 2018-12-17 ENCOUNTER — Other Ambulatory Visit (INDEPENDENT_AMBULATORY_CARE_PROVIDER_SITE_OTHER): Payer: Medicare Other

## 2018-12-17 ENCOUNTER — Encounter: Payer: Self-pay | Admitting: Internal Medicine

## 2018-12-17 VITALS — BP 136/68 | HR 73 | Temp 97.9°F | Resp 16 | Ht 60.0 in | Wt 140.0 lb

## 2018-12-17 DIAGNOSIS — E1121 Type 2 diabetes mellitus with diabetic nephropathy: Secondary | ICD-10-CM

## 2018-12-17 DIAGNOSIS — I6300 Cerebral infarction due to thrombosis of unspecified precerebral artery: Secondary | ICD-10-CM | POA: Diagnosis not present

## 2018-12-17 DIAGNOSIS — B354 Tinea corporis: Secondary | ICD-10-CM

## 2018-12-17 LAB — BASIC METABOLIC PANEL
BUN: 20 mg/dL (ref 6–23)
CO2: 31 mEq/L (ref 19–32)
Calcium: 9.3 mg/dL (ref 8.4–10.5)
Chloride: 99 mEq/L (ref 96–112)
Creatinine, Ser: 1.11 mg/dL (ref 0.40–1.20)
GFR: 57.85 mL/min — ABNORMAL LOW (ref >60.00)
Glucose, Bld: 198 mg/dL — ABNORMAL HIGH (ref 70–99)
Potassium: 3.5 mEq/L (ref 3.5–5.1)
Sodium: 137 mEq/L (ref 135–145)

## 2018-12-17 LAB — POCT GLYCOSYLATED HEMOGLOBIN (HGB A1C): Hemoglobin A1C: 11.5 % — AB (ref 4.0–5.6)

## 2018-12-17 MED ORDER — HUMALOG KWIKPEN 200 UNIT/ML ~~LOC~~ SOPN: 5.0000 [IU] | PEN_INJECTOR | Freq: Three times a day (TID) | SUBCUTANEOUS | 1 refills | Status: DC

## 2018-12-17 MED ORDER — CLOTRIMAZOLE-BETAMETHASONE 1-0.05 % EX CREA: 1.0000 "application " | TOPICAL_CREAM | Freq: Two times a day (BID) | CUTANEOUS | 1 refills | Status: DC

## 2018-12-17 MED ORDER — FLUCONAZOLE 100 MG PO TABS: 100.0000 mg | ORAL_TABLET | Freq: Every day | ORAL | 0 refills | Status: AC

## 2018-12-17 NOTE — Patient Instructions (Signed)

## 2018-12-17 NOTE — Progress Notes (Signed)
Subjective:  Patient ID: Michelle Lopez, female    DOB: Nov 25, 1945  Age: 73 y.o. MRN: [id number]  CC: Diabetes   HPI Michelle Lopez presents for f/up - She complains of a 1 to 2-week history of rash under both breasts and in the groin bilaterally.  Her caretaker is with her today and tells me she has been applying some over-the-counter topical agents with no relief.  The rash feels irritated but is not particularly painful or itchy.  Her caretaker also tells me that they think her blood sugars have been well controlled.  She is using basal insulin daily.  Outpatient Medications Prior to Visit  Medication Sig Dispense Refill   amLODipine (NORVASC) 5 MG tablet TAKE 1 TABLET BY MOUTH EVERY DAY 90 tablet 1   aspirin 81 MG tablet Take 81 mg by mouth daily.       atorvastatin (LIPITOR) 20 MG tablet TAKE 1 TABLET BY MOUTH EVERY DAY 90 tablet 1   Blood Glucose Monitoring Suppl (CONTOUR NEXT EZ) w/Device KIT 1 Act by Does not apply route 2 (two) times daily. 1 kit 0   cetirizine (ZYRTEC) 10 MG tablet Take 1 tablet (10 mg total) by mouth daily. 90 tablet 3   clopidogrel (PLAVIX) 75 MG tablet TAKE 1 TABLET BY MOUTH EVERY DAY 90 tablet 1   Glucerna (GLUCERNA) LIQD Take 1 Can by mouth 2 (two) times daily between meals. 60 Can 5   glucose blood (ONETOUCH VERIO) test strip Use to check blood sugars twice a day 300 each 1   Insulin Glargine, 2 Unit Dial, (TOUJEO MAX SOLOSTAR) 300 UNIT/ML SOPN Inject 36 Units into the skin daily. 6 mL 1   Insulin Pen Needle (NOVOFINE) 32G X 6 MM MISC Inject 1 Act into the skin daily. 100 each 3   Lancets (ONETOUCH ULTRASOFT) lancets Use to check blood sugars twice a day 300 each 1   losartan-hydrochlorothiazide (HYZAAR) 100-12.5 MG tablet TAKE 1 TABLET BY MOUTH EVERY DAY 90 tablet 1   metoprolol tartrate (LOPRESSOR) 25 MG tablet TAKE 1/2 TABLET TWICE A DAY 90 tablet 1   tamsulosin (FLOMAX) 0.4 MG CAPS capsule TAKE 1 CAPSULE BY MOUTH EVERY DAY 90 capsule 1     Vitamin D, Ergocalciferol, (DRISDOL) 1.25 MG (50000 UT) CAPS capsule Take 1 capsule (50,000 Units total) by mouth every 7 (seven) days. TAKE 1 CAPSULE BY MOUTH EVERY 7 DAYS 90 capsule 0   No facility-administered medications prior to visit.     ROS Review of Systems  Constitutional: Negative.  Negative for chills, fatigue and fever.  HENT: Negative.   Eyes: Negative for visual disturbance.  Respiratory: Negative for cough, chest tightness, shortness of breath and wheezing.   Cardiovascular: Negative for chest pain, palpitations and leg swelling.  Gastrointestinal: Negative for abdominal pain, constipation, diarrhea, nausea and vomiting.  Endocrine: Negative.  Negative for polydipsia, polyphagia and polyuria.  Genitourinary: Negative.  Negative for difficulty urinating.  Musculoskeletal: Negative.  Negative for back pain and myalgias.  Skin: Positive for rash. Negative for color change.  Neurological: Negative.  Negative for dizziness, weakness, light-headedness and headaches.  Hematological: Negative for adenopathy. Does not bruise/bleed easily.  Psychiatric/Behavioral: Negative.     Objective:  BP 136/68 (BP Location: Left Arm, Patient Position: Sitting, Cuff Size: Normal)    Pulse 73    Temp 97.9 F (36.6 C) (Oral)    Resp 16    Ht 5' (1.524 m)    Wt 140 lb (63.5 kg)  SpO2 98%    BMI 27.34 kg/m   BP Readings from Last 3 Encounters:  12/17/18 136/68  12/05/18 130/67  11/26/18 124/62    Wt Readings from Last 3 Encounters:  12/17/18 140 lb (63.5 kg)  11/26/18 132 lb 12.8 oz (60.2 kg)  08/15/18 138 lb (62.6 kg)    Physical Exam Vitals signs reviewed.  Constitutional:      Appearance: Normal appearance.  HENT:     Nose: Nose normal.     Mouth/Throat:     Mouth: Mucous membranes are moist.  Eyes:     General: No scleral icterus.    Conjunctiva/sclera: Conjunctivae normal.  Neck:     Musculoskeletal: Neck supple.  Cardiovascular:     Rate and Rhythm: Normal rate  and regular rhythm.     Heart sounds: No murmur.  Pulmonary:     Effort: Pulmonary effort is normal.     Breath sounds: No stridor. No wheezing, rhonchi or rales.  Abdominal:     General: Abdomen is protuberant. Bowel sounds are normal.     Palpations: There is no hepatomegaly or splenomegaly.  Musculoskeletal: Normal range of motion.     Right lower leg: No edema.  Lymphadenopathy:     Cervical: No cervical adenopathy.  Skin:    General: Skin is warm and dry.     Findings: Erythema and rash present.     Comments: In the intertriginous region under both breasts there is fissuring in the intertriginous lines and surrounding this large swaths of erythema with slight hyperpigmentation.  There are no satellite lesions and no vesicles, pustules, induration, warmth, or streaking.  See photo.  In the groin bilaterally there are large swaths of erythema with scale.  Neurological:     General: No focal deficit present.     Mental Status: She is alert.  Psychiatric:        Mood and Affect: Mood normal.        Behavior: Behavior normal.     Lab Results  Component Value Date   WBC 7.1 11/21/2018   HGB 13.5 11/21/2018   HCT 44.0 11/21/2018   PLT 230 11/21/2018   GLUCOSE 198 (H) 12/17/2018   CHOL 148 07/31/2018   TRIG 112.0 07/31/2018   HDL 38.20 (L) 07/31/2018   LDLDIRECT 100.0 07/06/2015   LDLCALC 88 07/31/2018   ALT 9 07/31/2018   AST 15 07/31/2018   NA 137 12/17/2018   K 3.5 12/17/2018   CL 99 12/17/2018   CREATININE 1.11 12/17/2018   BUN 20 12/17/2018   CO2 31 12/17/2018   TSH 1.30 07/31/2018   INR 1.1 (H) 02/25/2015   HGBA1C 11.5 (A) 12/17/2018   MICROALBUR <0.7 07/31/2018    No results found.  Assessment & Plan:   [name] was seen today for diabetes.  Diagnoses and all orders for this visit:  Type 2 diabetes mellitus with diabetic nephropathy, without long-term current use of insulin ([place])- Her A1c is 11.5%.  I have asked her caretaker to be certain that she is  receiving the basal insulin and to use quick acting insulin with any blood sugar above 200. -     POCT HgB A1C -     Basic metabolic panel; Future -     Insulin Lispro (HUMALOG KWIKPEN) 200 UNIT/ML SOPN; Inject 5 Units into the skin 3 (three) times daily with meals. -     Ambulatory referral to diabetic education -     Ambulatory referral to Endocrinology -  Consult to [facility]  Tinea corporis- I recommended that she treat this systemically with fluconazole and topically with a combination of clotrimazole and betamethasone.  This combination will adequately treat the infection and help reduce some of the symptoms related to the rash. -     fluconazole (DIFLUCAN) 100 MG tablet; Take 1 tablet (100 mg total) by mouth daily for 7 days. -     clotrimazole-betamethasone (LOTRISONE) cream; Apply 1 application topically 2 (two) times daily.   I am having Michelle Lopez start on fluconazole, clotrimazole-betamethasone, and HumaLOG KwikPen. I am also having her maintain her aspirin, onetouch ultrasoft, cetirizine, Contour Next EZ, glucose blood, Vitamin D (Ergocalciferol), Insulin Pen Needle, Glucerna, amLODipine, losartan-hydrochlorothiazide, tamsulosin, clopidogrel, atorvastatin, metoprolol tartrate, and Toujeo Max SoloStar.  Meds ordered this encounter  Medications   fluconazole (DIFLUCAN) 100 MG tablet    Sig: Take 1 tablet (100 mg total) by mouth daily for 7 days.    Dispense:  7 tablet    Refill:  0   clotrimazole-betamethasone (LOTRISONE) cream    Sig: Apply 1 application topically 2 (two) times daily.    Dispense:  45 g    Refill:  1   Insulin Lispro (HUMALOG KWIKPEN) 200 UNIT/ML SOPN    Sig: Inject 5 Units into the skin 3 (three) times daily with meals.    Dispense:  9 mL    Refill:  1     Follow-up: Return in about 3 weeks (around 01/07/2019).  Scarlette Calico, MD

## 2018-12-19 ENCOUNTER — Other Ambulatory Visit: Payer: Self-pay | Admitting: *Deleted

## 2018-12-19 NOTE — Patient Outreach (Signed)
Referral received from primary MD office for poorly controlled diabetes, pt with diagnoses chronic renal insufficiency stage 4, PAD, depression, tinea corporis, DM type 2, outreach call to pt for screening, unidentified person answered the phone and states " she's not here, call back another day".  RN CM mailed unsuccessful outreach letter to pt home.  PLAN Outreach pt in 3-4 business days  [name] [facility], [place] [phone]

## 2018-12-23 ENCOUNTER — Other Ambulatory Visit: Payer: Self-pay | Admitting: Internal Medicine

## 2018-12-23 DIAGNOSIS — B354 Tinea corporis: Secondary | ICD-10-CM

## 2018-12-23 NOTE — Telephone Encounter (Signed)
Requested medication (s) are due for refill today: no  Requested medication (s) are on the active medication list: yes  Last refill:  12/17/2018  Future visit scheduled: yes  Pts daughter called stating the pts rash has spread and so they are having to use more cream. Pts daughter states pt is completely out of the cream. Please advise.    Requested Prescriptions  Pending Prescriptions Disp Refills   clotrimazole-betamethasone (LOTRISONE) cream 45 g 1    Sig: Apply 1 application topically 2 (two) times daily.     Off-Protocol Failed - 12/23/2018  2:34 PM      Failed - Medication not assigned to a protocol, review manually.      Passed - Valid encounter within last 12 months    Recent Outpatient Visits          6 days ago Type 2 diabetes mellitus with diabetic nephropathy, without long-term current use of insulin ([place])   [facility] -Mayer Camel, MD   3 weeks ago Type 2 diabetes mellitus with diabetic nephropathy, without long-term current use of insulin ([place])   [place], Claudina Lick, MD   4 months ago Type 2 diabetes mellitus with diabetic nephropathy, without long-term current use of insulin ([place])   [place], Thomas L, MD   4 months ago Essential hypertension, benign   [place], Thomas L, MD   8 months ago CRI (chronic renal insufficiency), stage 4 (severe) [facility])   [place], Thomas L, MD      Future Appointments            In 2 weeks Ronnald Ramp Arvid Right, MD [place], [place]   In 8 months  [place], [facility]

## 2018-12-23 NOTE — Telephone Encounter (Signed)
Routing to CMA 

## 2018-12-23 NOTE — Telephone Encounter (Signed)
Medication Refill - Medication: clotrimazole-betamethasone (LOTRISONE) cream    Has the patient contacted their pharmacy? Yes.   Pts daughter called stating the pts rash has spread and so they are having to use more cream. Pts daughter states pt is completely out of the cream. Please advise.  (Agent: If no, request that the patient contact the pharmacy for the refill.) (Agent: If yes, when and what did the pharmacy advise?)  Preferred Pharmacy (with phone number or street name):  [id number] [place] [place]  [id number] [place] [id number]  Phone: [phone] Fax: [phone]  Open 24 hours     Agent: Please be advised that RX refills may take up to 3 business days. We ask that you follow-up with your pharmacy.

## 2018-12-24 ENCOUNTER — Other Ambulatory Visit: Payer: Self-pay | Admitting: *Deleted

## 2018-12-24 MED ORDER — CLOTRIMAZOLE-BETAMETHASONE 1-0.05 % EX CREA: 1.0000 "application " | TOPICAL_CREAM | Freq: Two times a day (BID) | CUTANEOUS | 1 refills | Status: DC

## 2018-12-24 NOTE — Patient Outreach (Signed)
Outreach call to pt (2nd attempt) for screening, female answered and states " this is her daughter, if you give me your number I"ll have her call you back"  RN CM gave contact telephone number.  PLAN Await for pt to call and/or outreach in 3-4 business days  [name] [facility], [place] [phone]

## 2018-12-26 ENCOUNTER — Telehealth: Payer: Self-pay | Admitting: Licensed Clinical Social Worker

## 2018-12-26 NOTE — Telephone Encounter (Signed)
SW spoke with patient's daughter, [name].  A home visit is scheduled for 11/25, at 9:30.

## 2018-12-30 ENCOUNTER — Encounter: Payer: Self-pay | Admitting: *Deleted

## 2018-12-30 ENCOUNTER — Other Ambulatory Visit: Payer: Self-pay | Admitting: *Deleted

## 2018-12-30 NOTE — Patient Outreach (Addendum)
Referral received from primary care provider for poor controlled diabetes, AIC= 11.5 12/17/18, outreach call to pt/ 3rd attempt for screening, spoke with patient's daughter Michiel Sites who provided alternate number to reach pt at [phone], spoke with Ms. Heiser who reports she has CNA that stays with her 7 days per week, 8 hours daily and asks that RN CM speak with her/ CNA [name] "Dee", pt daughter also lives with her.  Per [name], she checks pt CBG q am and records with most readings 70-200 and today's reading 137, states "she had problems with blood sugar back in October but it's much better now"   [name] states " she eats good, trying to limit bread"  Upon reviewing medications, [name] states pt does not have Humalog [place] 5 units TID with meals, RNCM faxed today's note and barrier letter (reported pt does not have humalog and ask MD to verify is pt supposed to be taking this) to primary MD.  Outpatient Encounter Medications as of 12/30/2018  Medication Sig  . amLODipine (NORVASC) 5 MG tablet TAKE 1 TABLET BY MOUTH EVERY DAY  . aspirin 81 MG tablet Take 81 mg by mouth daily.    Marland Kitchen atorvastatin (LIPITOR) 20 MG tablet TAKE 1 TABLET BY MOUTH EVERY DAY  . Blood Glucose Monitoring Suppl (CONTOUR NEXT EZ) w/Device KIT 1 Act by Does not apply route 2 (two) times daily.  . clopidogrel (PLAVIX) 75 MG tablet TAKE 1 TABLET BY MOUTH EVERY DAY  . clotrimazole-betamethasone (LOTRISONE) cream Apply 1 application topically 2 (two) times daily.  Marland Kitchen Glucerna (GLUCERNA) LIQD Take 1 Can by mouth 2 (two) times daily between meals.  Marland Kitchen glucose blood (ONETOUCH VERIO) test strip Use to check blood sugars twice a day  . Insulin Glargine, 2 Unit Dial, (TOUJEO MAX SOLOSTAR) 300 UNIT/ML SOPN Inject 36 Units into the skin daily.  . Insulin Pen Needle (NOVOFINE) 32G X 6 MM MISC Inject 1 Act into the skin daily.  . Lancets (ONETOUCH ULTRASOFT) lancets Use to check blood sugars twice a day  . losartan-hydrochlorothiazide  (HYZAAR) 100-12.5 MG tablet TAKE 1 TABLET BY MOUTH EVERY DAY  . metoprolol tartrate (LOPRESSOR) 25 MG tablet TAKE 1/2 TABLET TWICE A DAY  . tamsulosin (FLOMAX) 0.4 MG CAPS capsule TAKE 1 CAPSULE BY MOUTH EVERY DAY  . Vitamin D, Ergocalciferol, (DRISDOL) 1.25 MG (50000 UT) CAPS capsule Take 1 capsule (50,000 Units total) by mouth every 7 (seven) days. TAKE 1 CAPSULE BY MOUTH EVERY 7 DAYS  . cetirizine (ZYRTEC) 10 MG tablet Take 1 tablet (10 mg total) by mouth daily. (Patient not taking: Reported on 12/30/2018)  . Insulin Lispro (HUMALOG KWIKPEN) 200 UNIT/ML SOPN Inject 5 Units into the skin 3 (three) times daily with meals. (Patient not taking: Reported on 12/30/2018)   No facility-administered encounter medications on file as of 12/30/2018.     THN CM Care Plan Problem One     Most Recent Value  Care Plan Problem One  Knowledge deficit related to diabetes  Role Documenting the Problem One  Care Management [place] for Problem One  Active  THN Long Term Goal   Pt , CG will verbalize improvement in self care for diabetes aeb decrease AIC 1-2 points within 60 days  THN Long Term Goal Start Date  12/30/18  Interventions for Problem One Long Term Goal  RN CM established plan of care with pt, CNA wtih pt goals in place, mailed successful outreach letter to pt home including 24 hour nurse  line magnet, consent form, pamphlet, diabetes booklet and carb counting poster  THN CM Short Term Goal #1   Pt, CG will read through diabetes booklet within 30 days  THN CM Short Term Goal #1 Start Date  12/30/18  Interventions for Short Term Goal #1  RN CM mailed booklet and asked CG to read and review with pt  THN CM Short Term Goal #2   Pt, CG will utilize plate method and choose nutritious foods within 30 days  THN CM Short Term Goal #2 Start Date  12/30/18  Interventions for Short Term Goal #2  RN CM reviewed plate method and food choices with CG (CNA), correlation of food intake to affect on CBG       PLAN Outreach pt for telephone assessment in 3 weeks Reinforce carbohydrate counting, plate method  [name] [facility], [place] [phone]

## 2018-12-31 ENCOUNTER — Other Ambulatory Visit: Payer: Self-pay | Admitting: *Deleted

## 2018-12-31 ENCOUNTER — Other Ambulatory Visit: Payer: Self-pay | Admitting: Internal Medicine

## 2018-12-31 ENCOUNTER — Other Ambulatory Visit: Payer: Self-pay | Admitting: Pharmacist

## 2018-12-31 DIAGNOSIS — E1159 Type 2 diabetes mellitus with other circulatory complications: Secondary | ICD-10-CM

## 2018-12-31 DIAGNOSIS — I10 Essential (primary) hypertension: Secondary | ICD-10-CM

## 2018-12-31 DIAGNOSIS — E1121 Type 2 diabetes mellitus with diabetic nephropathy: Secondary | ICD-10-CM

## 2018-12-31 DIAGNOSIS — Z794 Long term (current) use of insulin: Secondary | ICD-10-CM

## 2018-12-31 MED ORDER — INSULIN ASPART 100 UNIT/ML FLEXPEN: 5.0000 [IU] | PEN_INJECTOR | Freq: Three times a day (TID) | SUBCUTANEOUS | 5 refills | Status: DC

## 2018-12-31 MED ORDER — AMLODIPINE BESYLATE 5 MG PO TABS: 5.0000 mg | ORAL_TABLET | Freq: Every day | ORAL | 1 refills | Status: DC

## 2018-12-31 NOTE — Patient Outreach (Signed)
[place] [facility]) Care Management  [place]   12/31/2018  Hester Joslin May 12, 1945 [id number]  Reason for referral: Medication Assistance with insulin (Humalog)  [place] assisted patient in 2018 with initiation of compliance packing and delivery from [facility].  This pharmacy has since closed. Per [facility]    Referral source: [facility] RN Current insurance: Medicare / Medicaid  PMHx includes but not limited to:  T2DM, chronic kidney disease, CVA, HTN, HLD, PAD, dementia, depression, osteoarthritis, and chronic neurological symptoms including ataxia, frequent falls, and weakness in both upper and lower extremities.   Per review of chart:  -Home visit scheduled with LCSW from MD office (?) for 11/25.    -MD would like patient to have SSI Humalog for CBGs > 200, otherwise she has instructions to use only basal insulin Toujeo.  -Patient has caregiver that assists with medication administration.  -[facility] that Hum[facility]og non-formulary.    Outreach:  Successful telephone call with patient's daughter, Rolly Salter.  HIPAA identifiers verified.   Subjective:  Daughter reports that patient no longer has compliance packs due to previous pharmacy closing however she would prefer patient to be on compliance packs as this would make medication organization much easier.    3-way call placed to [facility] which offers free compliance packaging and delivery.  Spoke with pharmacist, Larene Beach, who explained program to daughter.  Daughter is agreeable to transfer medications to [place] [facility] [place] [facility].   Daughter reports patient needs refill on amlodipine and has never used the short-acting insulin yet. She denies issues with cost of medications as patient as Medicaid.   Objective: The ASCVD Risk score Mikey Bussing DC Jr., et al., 2013) failed to calculate for the following reasons:   The patient has a prior MI or stroke diagnosis  Lab Results   Component Value Date   CREATININE 1.11 12/17/2018   CREATININE 1.44 (H) 11/21/2018   CREATININE 0.96 07/31/2018    Lab Results  Component Value Date   HGBA1C 11.5 (A) 12/17/2018    Lipid Panel     Component Value Date/Time   CHOL 148 07/31/2018 1127   TRIG 112.0 07/31/2018 1127   HDL 38.20 (L) 07/31/2018 1127   CHOLHDL 4 07/31/2018 1127   VLDL 22.4 07/31/2018 1127   LDLCALC 88 07/31/2018 1127   LDLDIRECT 100.0 07/06/2015 1201    BP Readings from Last 3 Encounters:  12/17/18 136/68  12/05/18 130/67  11/26/18 124/62    Allergies  Allergen Reactions  . Crestor [Rosuvastatin Calcium]     myalgias  . Morphine Hives  . Pravastatin Sodium Rash  . Simvastatin Rash    Medications Reviewed Today    Reviewed by Kassie Mends, RN (Registered Nurse) on 12/30/18 at 76  Med List Status: <None>  Medication Order Taking? Sig Documenting Provider Last Dose Status Informant  amLODipine (NORVASC) 5 MG tablet [id number] Yes TAKE 1 TABLET BY MOUTH EVERY DAY Janith Lima, MD Taking Active   aspirin 81 MG tablet [id number] Yes Take 81 mg by mouth daily.   [provider] Taking Active Self  atorvastatin (LIPITOR) 20 MG tablet [id number] Yes TAKE 1 TABLET BY MOUTH EVERY DAY Janith Lima, MD Taking Active   Blood Glucose Monitoring Suppl (CONTOUR NEXT EZ) w/Device KIT [id number] Yes 1 Act by Does not apply route 2 (two) times daily. Janith Lima, MD Taking Active Self  cetirizine (ZYRTEC) 10 MG tablet [id number] No Take 1 tablet (10 mg total) by  mouth daily.  Patient not taking: Reported on 12/30/2018   Janith Lima, MD Not Taking Active Self  clopidogrel (PLAVIX) 75 MG tablet [id number] Yes TAKE 1 TABLET BY MOUTH EVERY DAY Janith Lima, MD Taking Active   clotrimazole-betamethasone (LOTRISONE) cream [id number]  Apply 1 application topically 2 (two) times daily. Janith Lima, MD  Active   Glucerna [facility]) Yehuda Budd [id number] Yes Take 1 Can by mouth 2 (two) times daily  between meals. Janith Lima, MD Taking Active   glucose blood [facility] VERIO) test strip [id number] Yes Use to check blood sugars twice a day Janith Lima, MD Taking Active Self  Insulin Glargine, 2 Unit Dial, (TOUJEO MAX SOLOSTAR) 300 UNIT/ML SOPN [id number] Yes Inject 36 Units into the skin daily. Binnie Rail, MD Taking Active   Insulin Lispro (HUMALOG KWIKPEN) 200 UNIT/ML SOPN [id number] No Inject 5 Units into the skin 3 (three) times daily with meals.  Patient not taking: Reported on 12/30/2018   Janith Lima, MD Not Taking Active   Insulin Pen Needle (NOVOFINE) 32G X 6 MM MISC [id number] Yes Inject 1 Act into the skin daily. Janith Lima, MD Taking Active   Lancets [facility] ULTRASOFT) lancets [id number] Yes Use to check blood sugars twice a day Janith Lima, MD Taking Active Self  losartan-hydrochlorothiazide [facility]) 100-12.5 MG tablet [id number] Yes TAKE 1 TABLET BY MOUTH EVERY DAY Janith Lima, MD Taking Active   metoprolol tartrate (LOPRESSOR) 25 MG tablet [id number] Yes TAKE 1/2 TABLET TWICE A Frances Nickels, MD Taking Active   tamsulosin (FLOMAX) 0.4 MG CAPS capsule [id number] Yes TAKE 1 CAPSULE BY MOUTH EVERY DAY Janith Lima, MD Taking Active   Vitamin D, Ergocalciferol, (DRISDOL) 1.25 MG (50000 UT) CAPS capsule [id number] Yes Take 1 capsule (50,000 Units total) by mouth every 7 (seven) days. TAKE 1 CAPSULE BY MOUTH EVERY 7 DAYS Janith Lima, MD Taking Active           Assessment: Drugs sorted by system:  Hematologic: aspirin 77m, clopidogrel  Cardiovascular: amlodipine, atorvastatin, losartan-HCTZ, metoprolol  Pulmonary/Allergy: cetirizine  Gastrointestinal: Glucerna  Endocrine: insulin gargline 300/ml (Toujeo), insulin lispro (Humalog)  Topical: clotrimazole-betamethasone cream  Genitourinary:tamsulosin  Vitamins/Minerals/Supplements:vitamin D   Plan: . [facility]   . Will request new  prescriptions for amlodipine and Novolog be called into new Pharmacy . [facility] Will await call back from [place] if any other medication refills needed  CRalene Bathe PharmD, [phone]

## 2018-12-31 NOTE — Telephone Encounter (Signed)
Pt was seen in the office.

## 2018-12-31 NOTE — Patient Outreach (Signed)
12/30/18- primary care provider Dr. Scarlette Calico sent in basket stating pt is supposed to take humalog 5 units TID with meals especially if CBG is over 200.  [facility] [place] and per Meyer Cory is non-formulary and MD will need to call in what is preferred so insurance will pay,  RN CM let Dr. Ronnald Ramp know and offered [place] referral for assistance and Dr. Ronnald Ramp does want pharmacy referral.  RN CM placed order for [facility] pharmacist for assistance with obtaining insulin.  [name] [facility], [place] Coordinator [phone]

## 2019-01-01 ENCOUNTER — Other Ambulatory Visit: Payer: Self-pay | Admitting: Pharmacist

## 2019-01-01 ENCOUNTER — Ambulatory Visit: Payer: Self-pay | Admitting: *Deleted

## 2019-01-01 ENCOUNTER — Other Ambulatory Visit: Payer: Medicare Other | Admitting: Licensed Clinical Social Worker

## 2019-01-01 ENCOUNTER — Ambulatory Visit: Payer: Self-pay | Admitting: Pharmacist

## 2019-01-01 ENCOUNTER — Other Ambulatory Visit: Payer: Self-pay

## 2019-01-01 DIAGNOSIS — Z515 Encounter for palliative care: Secondary | ICD-10-CM

## 2019-01-01 NOTE — Patient Outreach (Signed)
[place] [facility]) Care Management  [place]  01/01/2019  Michelle Lopez 27-Oct-1945 [id number]  Communication received from MD that amlodipine and Novolog RXs called into [facility].   Unsuccessful calls to [place] to confirm prescriptions received.    Plan:  Will outreach pharmacy again next week.   Ralene Bathe, PharmD, [place] [phone]

## 2019-01-01 NOTE — Progress Notes (Signed)
COMMUNITY PALLIATIVE CARE SW NOTE  PATIENT NAME: Michelle Lopez DOB: 03-Mar-1945 MRN: [id number]  PRIMARY CARE PROVIDER: Janith Lima, MD  RESPONSIBLE PARTY:  Acct ID - Guarantor Home Phone Work Phone Relationship Acct Type  [id number] - Bru[place],[phone]  Self P/F     [place], [address] [place], [place] [address]     PLAN OF CARE and INTERVENTIONS:             1. GOALS OF CARE/ ADVANCE CARE PLANNING:  Patient's goal is to remain at home with her daughter, [name].  She is a full code. 2. SOCIAL/EMOTIONAL/SPIRITUAL ASSESSMENT/ INTERVENTIONS:  SW met with patient and her daughter, [name], in patient's home.  Her caregiver was also present.  Patient was sitting in her bed upon SW arrival.  She denied pain and stated she was eating and sleeping well.  Patient answered questions appropriately.  She displayed a bright affect when talking about spending Thanksgiving with her family.  [name] stated her sister has expressed concern when patient wakes up and takes her medications.  Informed her the information would be given to the nurse for clarification. 3. PATIENT/CAREGIVER EDUCATION/ COPING:  Daughter copes by problem-solving. 4. PERSONAL EMERGENCY PLAN:  Daughter will contact patient's MD.  Patient will rest when she becomes tired. 5. COMMUNITY RESOURCES COORDINATION/ HEALTH CARE NAVIGATION:  Patient has caregivers 9 hours a day seven days a week. 6. FINANCIAL/LEGAL CONCERNS/INTERVENTIONS:  She is on a fixed income.     SOCIAL HX:  Social History   Tobacco Use  . Smoking status: Never Smoker  . Smokeless tobacco: Never Used  Substance Use Topics  . Alcohol use: No    Alcohol/week: 0.0 standard drinks    CODE STATUS:  Full Code  ADVANCED DIRECTIVES: No MOST FORM COMPLETE:  No HOSPICE EDUCATION PROVIDED:  No PPS:  Patient stated her appetite was normal.  She walks independently in her home. Duration of visit and documentation:  45 minutes.      Creola Corn Orine Goga, LCSW

## 2019-01-06 ENCOUNTER — Ambulatory Visit: Payer: Self-pay | Admitting: Pharmacist

## 2019-01-06 ENCOUNTER — Other Ambulatory Visit: Payer: Self-pay | Admitting: Internal Medicine

## 2019-01-06 ENCOUNTER — Other Ambulatory Visit: Payer: Self-pay | Admitting: Pharmacist

## 2019-01-06 DIAGNOSIS — E1121 Type 2 diabetes mellitus with diabetic nephropathy: Secondary | ICD-10-CM

## 2019-01-06 DIAGNOSIS — J301 Allergic rhinitis due to pollen: Secondary | ICD-10-CM

## 2019-01-06 DIAGNOSIS — I10 Essential (primary) hypertension: Secondary | ICD-10-CM

## 2019-01-06 MED ORDER — NOVOFINE 32G X 6 MM MISC: 1.0000 | Freq: Every day | 1 refills | Status: DC

## 2019-01-06 MED ORDER — CETIRIZINE HCL 10 MG PO TABS: 10.0000 mg | ORAL_TABLET | Freq: Every day | ORAL | 1 refills | Status: DC

## 2019-01-06 MED ORDER — TAMSULOSIN HCL 0.4 MG PO CAPS: 0.4000 mg | ORAL_CAPSULE | Freq: Every day | ORAL | 1 refills | Status: DC

## 2019-01-06 NOTE — Patient Outreach (Signed)
[place] [facility]) Care Management  [place] 01/06/2019  Tenielle Aguino February 13, 1945 [id number]  Reason for call: f/u on medication mgmt for patient  Successful call to [facility].   -[facility] -[facility], claim will need to be reversed to be billed at [place] -Missing prescriptions for cetirizine, tamsulosin, test strips, pen needles -Able to deliver medications to patient this afternoon -Test strips may be cheaper at [facility] since billed through Medicare Part B  Successful call to patient and daughters -Daughters report ok to reverse claim from [facility] for amlodipine and fill at [place] -Daughters report patient will run out of amlodipine, losartan-HCTZ today and vitamin D on Friday -Agreeable to have medications delivered today -Voiced understanding regarding pricing for test strips, pen needles.  Currently filling test strips at [facility].  Ok to continue getting testing supplies from here.   Successful return call to [facility] with Phineas Real who will coordinate amlodipine, losartan-HCTZ medication fills today and will collaborate with pharmacist on starting date for compliance packs  Plan: Will request new RXs from PCP for cetirizine, tamsulsin, pen needles Will f/u again tomorrow   Ralene Bathe, PharmD, [place] [phone]

## 2019-01-07 ENCOUNTER — Ambulatory Visit (INDEPENDENT_AMBULATORY_CARE_PROVIDER_SITE_OTHER): Payer: Medicare Other | Admitting: Internal Medicine

## 2019-01-07 ENCOUNTER — Encounter: Payer: Self-pay | Admitting: Internal Medicine

## 2019-01-07 ENCOUNTER — Other Ambulatory Visit: Payer: Self-pay

## 2019-01-07 VITALS — BP 128/68 | HR 65 | Temp 98.5°F | Resp 16 | Ht 60.0 in | Wt 144.8 lb

## 2019-01-07 DIAGNOSIS — B354 Tinea corporis: Secondary | ICD-10-CM | POA: Diagnosis not present

## 2019-01-07 DIAGNOSIS — Z23 Encounter for immunization: Secondary | ICD-10-CM | POA: Diagnosis not present

## 2019-01-07 DIAGNOSIS — I6300 Cerebral infarction due to thrombosis of unspecified precerebral artery: Secondary | ICD-10-CM

## 2019-01-07 DIAGNOSIS — I10 Essential (primary) hypertension: Secondary | ICD-10-CM | POA: Diagnosis not present

## 2019-01-07 NOTE — Patient Instructions (Signed)
Type 2 Diabetes Mellitus, Diagnosis, Adult Type 2 diabetes (type 2 diabetes mellitus) is a long-term (chronic) disease. In type 2 diabetes, one or both of these problems may be present:  The pancreas does not make enough of a hormone called insulin.  Cells in the body do not respond properly to insulin that the body makes (insulin resistance). Normally, insulin allows blood sugar (glucose) to enter cells in the body. The cells use glucose for energy. Insulin resistance or lack of insulin causes excess glucose to build up in the blood instead of going into cells. As a result, high blood glucose (hyperglycemia) develops. What increases the risk? The following factors may make you more likely to develop type 2 diabetes:  Having a family member with type 2 diabetes.  Being overweight or obese.  Having an inactive (sedentary) lifestyle.  Having been diagnosed with insulin resistance.  Having a history of prediabetes, gestational diabetes, or polycystic ovary syndrome (PCOS).  Being of American-Indian, African-American, Hispanic/Latino, or Asian/Pacific Islander descent. What are the signs or symptoms? In the early stage of this condition, you may not have symptoms. Symptoms develop slowly and may include:  Increased thirst (polydipsia).  Increased hunger(polyphagia).  Increased urination (polyuria).  Increased urination during the night (nocturia).  Unexplained weight loss.  Frequent infections that keep coming back (recurring).  Fatigue.  Weakness.  Vision changes, such as blurry vision.  Cuts or bruises that are slow to heal.  Tingling or numbness in the hands or feet.  Dark patches on the skin (acanthosis nigricans). How is this diagnosed? This condition is diagnosed based on your symptoms, your medical history, a physical exam, and your blood glucose level. Your blood glucose may be checked with one or more of the following blood tests:  A fasting blood glucose (FBG)  test. You will not be allowed to eat (you will fast) for 8 hours or longer before a blood sample is taken.  A random blood glucose test. This test checks blood glucose at any time of day regardless of when you ate.  An A1c (hemoglobin A1c) blood test. This test provides information about blood glucose control over the previous 2-3 months.  An oral glucose tolerance test (OGTT). This test measures your blood glucose at two times: ? After fasting. This is your baseline blood glucose level. ? Two hours after drinking a beverage that contains glucose. You may be diagnosed with type 2 diabetes if:  Your FBG level is 126 mg/dL (7.0 mmol/L) or higher.  Your random blood glucose level is 200 mg/dL (11.1 mmol/L) or higher.  Your A1c level is 6.5% or higher.  Your OGTT result is higher than 200 mg/dL (11.1 mmol/L). These blood tests may be repeated to confirm your diagnosis. How is this treated? Your treatment may be managed by a specialist called an endocrinologist. Type 2 diabetes may be treated by following instructions from your health care provider about:  Making diet and lifestyle changes. This may include: ? Following an individualized nutrition plan that is developed by a diet and nutrition specialist (registered dietitian). ? Exercising regularly. ? Finding ways to manage stress.  Checking your blood glucose level as often as told.  Taking diabetes medicines or insulin daily. This helps to keep your blood glucose levels in the healthy range. ? If you use insulin, you may need to adjust the dosage depending on how physically active you are and what foods you eat. Your health care provider will tell you how to adjust your dosage.    Taking medicines to help prevent complications from diabetes, such as: ? Aspirin. ? Medicine to lower cholesterol. ? Medicine to control blood pressure. Your health care provider will set individualized treatment goals for you. Your goals will be based on  your age, other medical conditions you have, and how you respond to diabetes treatment. Generally, the goal of treatment is to maintain the following blood glucose levels:  Before meals (preprandial): 80-130 mg/dL (4.4-7.2 mmol/L).  After meals (postprandial): below 180 mg/dL (10 mmol/L).  A1c level: less than 7%. Follow these instructions at home: Questions to ask your health care provider  Consider asking the following questions: ? Do I need to meet with a diabetes educator? ? Where can I find a support group for people with diabetes? ? What equipment will I need to manage my diabetes at home? ? What diabetes medicines do I need, and when should I take them? ? How often do I need to check my blood glucose? ? What number can I call if I have questions? ? When is my next appointment? General instructions  Take over-the-counter and prescription medicines only as told by your health care provider.  Keep all follow-up visits as told by your health care provider. This is important.  For more information about diabetes, visit: ? American Diabetes Association (ADA): www.diabetes.org ? American Association of Diabetes Educators (AADE): www.diabeteseducator.org Contact a health care provider if:  Your blood glucose is at or above 240 mg/dL (13.3 mmol/L) for 2 days in a row.  You have been sick or have had a fever for 2 days or longer, and you are not getting better.  You have any of the following problems for more than 6 hours: ? You cannot eat or drink. ? You have nausea and vomiting. ? You have diarrhea. Get help right away if:  Your blood glucose is lower than 54 mg/dL (3.0 mmol/L).  You become confused or you have trouble thinking clearly.  You have difficulty breathing.  You have moderate or large ketone levels in your urine. Summary  Type 2 diabetes (type 2 diabetes mellitus) is a long-term (chronic) disease. In type 2 diabetes, the pancreas does not make enough of a  hormone called insulin, or cells in the body do not respond properly to insulin that the body makes (insulin resistance).  This condition is treated by making diet and lifestyle changes and taking diabetes medicines or insulin.  Your health care provider will set individualized treatment goals for you. Your goals will be based on your age, other medical conditions you have, and how you respond to diabetes treatment.  Keep all follow-up visits as told by your health care provider. This is important. This information is not intended to replace advice given to you by your health care provider. Make sure you discuss any questions you have with your health care provider. Document Released: 01/23/2005 Document Revised: 03/23/2017 Document Reviewed: 02/26/2015 Elsevier Patient Education  2020 [employer]  

## 2019-01-07 NOTE — Progress Notes (Signed)
Subjective:  Patient ID: Michelle Lopez, female    DOB: 1945/07/26  Age: 73 y.o. MRN: [id number]  CC: Hypertension, Rash, and Diabetes   This visit occurred during the SARS-CoV-2 public health emergency.  Safety protocols were in place, including screening questions prior to the visit, additional usage of staff PPE, and extensive cleaning of exam room while observing appropriate contact time as indicated for disinfecting solutions.    HPI Michelle Lopez presents for f/up- She is with her caretaker today.  She tells me the rash under her breast has resolved after she took a course of Diflucan.  They tell me her blood sugars have been well controlled.  She has felt well recently and offers no complaints.  Outpatient Medications Prior to Visit  Medication Sig Dispense Refill  . amLODipine (NORVASC) 5 MG tablet TAKE 1 TABLET BY MOUTH EVERY DAY 90 tablet 1  . aspirin 81 MG tablet Take 81 mg by mouth daily.      Marland Kitchen atorvastatin (LIPITOR) 20 MG tablet TAKE 1 TABLET BY MOUTH EVERY DAY 90 tablet 1  . Blood Glucose Monitoring Suppl (CONTOUR NEXT EZ) w/Device KIT 1 Act by Does not apply route 2 (two) times daily. 1 kit 0  . cetirizine (ZYRTEC) 10 MG tablet Take 1 tablet (10 mg total) by mouth daily. 90 tablet 1  . clopidogrel (PLAVIX) 75 MG tablet TAKE 1 TABLET BY MOUTH EVERY DAY 90 tablet 1  . clotrimazole-betamethasone (LOTRISONE) cream Apply 1 application topically 2 (two) times daily. 45 g 1  . Glucerna (GLUCERNA) LIQD Take 1 Can by mouth 2 (two) times daily between meals. 60 Can 5  . glucose blood (ONETOUCH VERIO) test strip Use to check blood sugars twice a day 300 each 1  . insulin aspart (NOVOLOG) 100 UNIT/ML FlexPen Inject 5 Units into the skin 3 (three) times daily with meals. 15 mL 5  . Insulin Glargine, 2 Unit Dial, (TOUJEO MAX SOLOSTAR) 300 UNIT/ML SOPN Inject 36 Units into the skin daily. 6 mL 1  . Insulin Pen Needle (NOVOFINE) 32G X 6 MM MISC Inject 1 Act into the skin daily. 300 each 1   . Lancets (ONETOUCH ULTRASOFT) lancets Use to check blood sugars twice a day 300 each 1  . losartan-hydrochlorothiazide (HYZAAR) 100-12.5 MG tablet TAKE 1 TABLET BY MOUTH EVERY DAY 90 tablet 1  . metoprolol tartrate (LOPRESSOR) 25 MG tablet TAKE 1/2 TABLET TWICE A DAY 90 tablet 1  . tamsulosin (FLOMAX) 0.4 MG CAPS capsule Take 1 capsule (0.4 mg total) by mouth daily. 90 capsule 1  . Vitamin D, Ergocalciferol, (DRISDOL) 1.25 MG (50000 UT) CAPS capsule Take 1 capsule (50,000 Units total) by mouth every 7 (seven) days. TAKE 1 CAPSULE BY MOUTH EVERY 7 DAYS 90 capsule 0   No facility-administered medications prior to visit.     ROS Review of Systems  Constitutional: Negative.  Negative for chills, fatigue and fever.  HENT: Negative.   Eyes: Negative for visual disturbance.  Respiratory: Negative for chest tightness, shortness of breath and wheezing.   Cardiovascular: Negative for chest pain, palpitations and leg swelling.  Gastrointestinal: Negative for abdominal pain, diarrhea, nausea and vomiting.  Genitourinary: Negative.  Negative for difficulty urinating.  Musculoskeletal: Negative for arthralgias, joint swelling and myalgias.  Skin: Positive for color change. Negative for rash.  Neurological: Negative.  Negative for dizziness.  Hematological: Negative for adenopathy. Does not bruise/bleed easily.  Psychiatric/Behavioral: Negative.     Objective:  BP 128/68 (BP Location: Left Arm, Patient  Position: Sitting, Cuff Size: Normal)   Pulse 65   Temp 98.5 F (36.9 C) (Oral)   Resp 16   Ht 5' (1.524 m)   Wt 144 lb 12 oz (65.7 kg)   SpO2 98%   BMI 28.27 kg/m   BP Readings from Last 3 Encounters:  01/07/19 128/68  12/17/18 136/68  12/05/18 130/67    Wt Readings from Last 3 Encounters:  01/07/19 144 lb 12 oz (65.7 kg)  12/30/18 132 lb (59.9 kg)  12/17/18 140 lb (63.5 kg)    Physical Exam Vitals signs reviewed.  Constitutional:      Appearance: Normal appearance.  HENT:      Nose: Nose normal.     Mouth/Throat:     Mouth: Mucous membranes are moist.  Eyes:     General: No scleral icterus.    Conjunctiva/sclera: Conjunctivae normal.  Neck:     Musculoskeletal: Neck supple.  Cardiovascular:     Rate and Rhythm: Normal rate and regular rhythm.  Pulmonary:     Effort: Pulmonary effort is normal.     Breath sounds: No stridor. No wheezing, rhonchi or rales.  Abdominal:     General: Abdomen is flat. Bowel sounds are normal. There is no distension.     Palpations: Abdomen is soft. There is no hepatomegaly or splenomegaly.     Tenderness: There is no abdominal tenderness.  Musculoskeletal: Normal range of motion.     Right lower leg: No edema.     Left lower leg: No edema.  Lymphadenopathy:     Cervical: No cervical adenopathy.  Skin:    General: Skin is warm.     Comments: In the intertriginous region, under both breasts, there is symmetrical hyperpigmentation consistent with postinflammatory pigmentary alteration but there is no erythema, vesicles, scale, warmth, or tenderness.  Neurological:     General: No focal deficit present.  Psychiatric:        Mood and Affect: Mood normal.        Behavior: Behavior normal.     Lab Results  Component Value Date   WBC 7.1 11/21/2018   HGB 13.5 11/21/2018   HCT 44.0 11/21/2018   PLT 230 11/21/2018   GLUCOSE 198 (H) 12/17/2018   CHOL 148 07/31/2018   TRIG 112.0 07/31/2018   HDL 38.20 (L) 07/31/2018   LDLDIRECT 100.0 07/06/2015   LDLCALC 88 07/31/2018   ALT 9 07/31/2018   AST 15 07/31/2018   NA 137 12/17/2018   K 3.5 12/17/2018   CL 99 12/17/2018   CREATININE 1.11 12/17/2018   BUN 20 12/17/2018   CO2 31 12/17/2018   TSH 1.30 07/31/2018   INR 1.1 (H) 02/25/2015   HGBA1C 11.5 (A) 12/17/2018   MICROALBUR <0.7 07/31/2018    No results found.  Assessment & Plan:   Michelle Lopez was seen today for hypertension, rash and diabetes.  Diagnoses and all orders for this visit:  Essential hypertension, benign-  Her blood pressure is adequately well controlled.  Need for influenza vaccination -     Flu Vaccine QUAD High Dose(Fluad)  Tinea corporis- The infection has resolved.  I offered him reassurance that this will get better over time.   I am having Michelle Lopez maintain her aspirin, onetouch ultrasoft, Contour Next EZ, glucose blood, Vitamin D (Ergocalciferol), Glucerna, clopidogrel, atorvastatin, metoprolol tartrate, Toujeo Max SoloStar, clotrimazole-betamethasone, insulin aspart, losartan-hydrochlorothiazide, amLODipine, tamsulosin, NovoFine, and cetirizine.  No orders of the defined types were placed in this encounter.    Follow-up: Return  in about 3 months (around 04/07/2019).  Scarlette Calico, MD

## 2019-01-08 ENCOUNTER — Other Ambulatory Visit: Payer: Self-pay | Admitting: Pharmacist

## 2019-01-08 NOTE — Patient Outreach (Signed)
[facility]) Care Management  [place] 01/08/2019  Michelle Lopez 01-20-1946 [id number]  Incoming call from [facility].  All prescriptions transferred and compliance packaging can begin on 12/28.  Pharmacy will plan to deliver packs to patient's home on this date.  Pharmacy also has pen needles and RX for Toujeo and can fill these whenever patient needs them.  Pharmacist will call patient today to provide update.  Other medications (amlodipine, losartan-HCTZ, vitamin D) were delivered earlier this week. Patient will continue to go to [facility] for testing supplies.   Plan: Call patient on Friday to make sure she has all medications she needs and verify she understands regarding start date for compliance packs.   Ralene Bathe, PharmD, [place] [phone]

## 2019-01-09 ENCOUNTER — Telehealth: Payer: Self-pay

## 2019-01-09 ENCOUNTER — Ambulatory Visit: Payer: Medicare Other | Admitting: Pharmacist

## 2019-01-09 NOTE — Telephone Encounter (Signed)
Copied from [place] [id number]. Topic: General - Inquiry >> Jan 01, 2019  9:35 AM Mathis Bud wrote: Reason for CRM: Rosaria Ferries (daughter) called stating she has questions regarding her mothers medication.  Daughter is requesting a call back from PCP nurse or PCP. Call back [phone] >> Jan 09, 2019 10:01 AM Percell Belt A wrote: Pt daughter called in and stated that the novolog med increased and she is not sure why.  She stated it she was only injecting 1 time a day and the new instructions is to inject 5 times a day.  She is concerned that is a lot.   Bests number [phone]

## 2019-01-09 NOTE — Telephone Encounter (Signed)
Contacted [name] (pt dtr) and informed that pt is on another insulin now due to A1c up to 11.5%.   Instructed [name] on the dosing and schedule for both.   5 units of Novolog tid with meals  36 units of Toujeo qd.   Michelle Lopez stated understanding and will me know if she has any additional questions or if I can be of any additional assistance.

## 2019-01-10 ENCOUNTER — Ambulatory Visit: Payer: Self-pay | Admitting: Pharmacist

## 2019-01-10 ENCOUNTER — Other Ambulatory Visit: Payer: Self-pay | Admitting: Pharmacist

## 2019-01-10 NOTE — Patient Outreach (Signed)
[facility]) Care Management  [place] 01/10/2019  Bethanny Eick [birth year] [id number]  Reason for call: f/u on medication mgmt  Successful call with Ms. Edward's daughter, [name].  [name] reports patient received all medications that she needed from [facility] earlier this week.  We reviewed that compliance packs will start 12/28.  [name] states patient is looking forward to having this service again.  We also reviewed insulin dosing now that patient has both short and long-acting insulin.  [name] was able to tell me how she and her sister will help with dosing and confirmed correct dosing for each insulin.  I encouraged her to reach out to myself or [facility] pharmacy if she needs anything before the compliance packs start on 12/28.    Plan: F/u with patient at the end of December after medication compliance packs are delivered to ensure patient understands how to use  Ralene Bathe, PharmD, [place] [phone]

## 2019-01-20 ENCOUNTER — Other Ambulatory Visit: Payer: Self-pay | Admitting: *Deleted

## 2019-01-20 NOTE — Patient Outreach (Signed)
Outreach call to patient for telephone assessment, spoke with pt, HIPAA verified, pt reports she is doing well and recording CBG daily, checking CBG in the morning.  Pt asks that  RN CM speak with CG/ CNA for further assessment, per CNA pt is eating well choosing green foods such as green beans, brocolli, spinach, etc, baked foods and states " we check sodium, carbohydrates, cholesterol and everything before we buy anything"  pt is taking novolog 5 units TID, CBG readings are in 100's range most of the time 160 today and 143 yesterday with one reading in 70's range.  Pt continues taking toujeo 36 units at hs, pt to follow up with primary care January 2021.  THN CM Care Plan Problem One     Most Recent Value  Care Plan Problem One  Knowledge deficit related to diabetes  Role Documenting the Problem One  Care Management Coordinator  Care Plan for Problem One  Active  THN Long Term Goal   Pt , CG will verbalize improvement in self care for diabetes aeb decrease AIC 1-2 points within 60 days  THN Long Term Goal Start Date  12/30/18  Interventions for Problem One Long Term Goal  RN CM reviewed plan of care with pt and CG (CNA that assists pt), reviewed diet intake, CBG log, pt now has insulin  THN CM Short Term Goal #1   Pt, CG will read through diabetes booklet within 30 days  THN CM Short Term Goal #1 Start Date  12/30/18  THN CM Short Term Goal #1 Met Date  01/20/19  Interventions for Short Term Goal #1  CG (CNA) states they have read through book and also utilized other resources to manage diabetes  THN CM Short Term Goal #2   Pt, CG will utilize plate method and choose nutritious foods within 30 days  THN CM Short Term Goal #2 Start Date  01/20/19 [re-established]  Interventions for Short Term Goal #2  RN CM reinforced nutritious food choices and plate method      PLAN Outreach pt, CG next month, plan to discharge or transfer to RN health coach     RNC, BSN THN Community Care  Coordinator [phone]   

## 2019-01-30 ENCOUNTER — Other Ambulatory Visit: Payer: Self-pay

## 2019-01-30 ENCOUNTER — Other Ambulatory Visit: Payer: Medicare Other | Admitting: Licensed Clinical Social Worker

## 2019-01-30 DIAGNOSIS — Z515 Encounter for palliative care: Secondary | ICD-10-CM

## 2019-01-30 NOTE — Progress Notes (Signed)
COMMUNITY PALLIATIVE CARE SW NOTE  PATIENT NAME: Michelle Lopez DOB: 03-Jan-1946 MRN: [id number]  PRIMARY CARE PROVIDER: Janith Lima, MD  RESPONSIBLE PARTY:  Acct ID - Guarantor Home Phone Work Phone Relationship Acct Type  [id number] - [place]an,[phone]  Self P/F     [address], [place], [place], [place] [address]   Due to the COVID-19 crisis, this virtual check-in visit was done via telephone from my office and it was initiated and consent by this patientand orfamily.  PLAN OF CARE and INTERVENTIONS:             1. GOALS OF CARE/ ADVANCE CARE PLANNING:  Goal is for patient to remain at home with her daughter, Michelle Lopez.  Patient is a full code. 2. SOCIAL/EMOTIONAL/SPIRITUAL ASSESSMENT/ INTERVENTIONS:  SW conducted a Sales executive visit with patient's daughter, Michelle Lopez.  She was on her way out of state to visit family.  She stated her mother is doing well, with no current concerns.  Michelle Lopez was able to obtain pre-packaged medications for patient at the local [facility].  They are trying to regulate her sugars due to patient liking sweets.  The family has patient walk the sidewalk in front of her house a few times a day.  Michelle Lopez reports patient is resistant to this, however.  SW provided active listening and supportive counseling. 3. PATIENT/CAREGIVER EDUCATION/ COPING:  Daughter copes by problem-solving. 4. PERSONAL EMERGENCY PLAN:  Family will contact patient's MD. 5. COMMUNITY RESOURCES COORDINATION/ HEALTH CARE NAVIGATION:  Patient receives caregiving for nine hours a day.. 6. FINANCIAL/LEGAL CONCERNS/INTERVENTIONS:  Patient is on a fixed income.     SOCIAL HX:  Social History   Tobacco Use  . Smoking status: Never Smoker  . Smokeless tobacco: Never Used  Substance Use Topics  . Alcohol use: No    Alcohol/week: 0.0 standard drinks    CODE STATUS:  Full Code ADVANCED DIRECTIVES: No MOST FORM COMPLETE:  No HOSPICE EDUCATION PROVIDED: No PPS:  Daughter reports patient's  appetite is normal.  She independently walks in the house. Duration of visit and documentation:  45 minutes.      Creola Corn Alexsis Kathman, LCSW

## 2019-02-04 ENCOUNTER — Ambulatory Visit: Payer: Self-pay | Admitting: Pharmacist

## 2019-02-04 ENCOUNTER — Other Ambulatory Visit: Payer: Self-pay | Admitting: Pharmacist

## 2019-02-04 NOTE — Patient Outreach (Signed)
[facility]) Care Management  [place] 02/04/2019  Michelle Lopez 25-Dec-1945 [id number]  Reason for call: f/u on medication compliance packs  Successful call with Ms. Edward's daughter, Michelle Lopez.  Daughter reports that compliance packs were delivered yesterday from [facility] and patient is very happy with them.  Patient understands how to use them and is familiar from when she had this service a few years ago.  Daughter denies any medication concerns or needs at this time.  We reviewed that refills for insulins need to be called in to pharmacy a few days before she runs out however oral medications will come monthly.  Provided my contact information if patient or family needs to reach out to me in the future.   Plan: Will close [place] case at this time as no further medication questions or concerns  Ralene Bathe, PharmD, [place] [phone]

## 2019-02-12 ENCOUNTER — Ambulatory Visit: Payer: Medicare Other | Admitting: *Deleted

## 2019-02-17 ENCOUNTER — Other Ambulatory Visit: Payer: Self-pay | Admitting: *Deleted

## 2019-02-17 DIAGNOSIS — Z794 Long term (current) use of insulin: Secondary | ICD-10-CM

## 2019-02-17 DIAGNOSIS — E1169 Type 2 diabetes mellitus with other specified complication: Secondary | ICD-10-CM

## 2019-02-17 NOTE — Patient Outreach (Signed)
Outreach call to pt for telephone assessment, spoke with pt and she prefers RN CM speak with CNA [name], per CNA pt is doing well, appetite good, CBG ranges 90-low 100's, continues on same dosage insulin, CNA continues staying with pt 7 days per week/ 8 hours daily. No new concerns voiced.  Patient care transferred to RN health coach.    THN CM Care Plan Problem One     Most Recent Value  Care Plan Problem One  Knowledge deficit related to diabetes  Role Documenting the Problem One  Care Management [place] for Problem One  Active  THN Long Term Goal   Pt , CG will verbalize improvement in self care for diabetes aeb decrease AIC 1-2 points within 60 days  THN Long Term Goal Start Date  02/17/19 Barrie Folk re-established   AIC not checked yet ]  Interventions for Problem One Long Term Goal  RN CM reinforced plan of care with pt, CNA, pt taking medications as prescribed per CNA, CBG improved 90-100's range, discussed with pt, CNA and will transfer pt to RN health coach today,  RN CM faxed discipline closure letter to primary MD and mailed letter for complex case management closure to pt home.  THN CM Short Term Goal #1   Pt, CG will read through diabetes booklet within 30 days  THN CM Short Term Goal #1 Start Date  12/30/18  [facility] CM Short Term Goal #1 Met Date  01/20/19  THN CM Short Term Goal #2   Pt, CG will utilize plate method and choose nutritious foods within 30 days  THN CM Short Term Goal #2 Start Date  01/20/19 [re-established]  THN CM Short Term Goal #2 Met Date  02/17/19  Interventions for Short Term Goal #2  RN CM reviewed plate method, importance of eating 3 meals daily      PLAN Transfer to RN health coach  [name] [facility], BSN [place] Coordinator [phone]

## 2019-02-25 ENCOUNTER — Other Ambulatory Visit: Payer: Self-pay | Admitting: *Deleted

## 2019-02-25 NOTE — Patient Outreach (Signed)
This Probation officer will continue to follow Michelle Lopez for ongoing diabetes management, pharmacist has closed case as pt now has all medications.    PLAN Outreach pt in February  Michelle Lopez [facility], [place] Coordinator [phone]

## 2019-02-27 ENCOUNTER — Telehealth: Payer: Self-pay

## 2019-02-27 NOTE — Telephone Encounter (Signed)
Received a fax from [facility] stating that a 90 day supply would not cost the patient any additional money but would not save any money either.   Called pt number and spoke to pt dtr [name]) and informed of the fax. [name] stated that pt is currently getting the blister packs.  For now we will forego changing to a 90day supply of medication to reduce confusion.   Informed [name] if the cost for a 90 day would save patient money then we will revisit rewriting the scripts. Also informed if [name] that if she changes her mind and would rather have the 90 day sent to reduce trips then would be happy to send that in for her.

## 2019-03-07 ENCOUNTER — Other Ambulatory Visit: Payer: Medicare Other | Admitting: Licensed Clinical Social Worker

## 2019-03-07 DIAGNOSIS — Z515 Encounter for palliative care: Secondary | ICD-10-CM

## 2019-03-10 ENCOUNTER — Other Ambulatory Visit: Payer: Self-pay

## 2019-03-10 NOTE — Progress Notes (Signed)
COMMUNITY PALLIATIVE CARE SW NOTE  PATIENT NAME: Michelle Lopez DOB: 04-10-1945 MRN: [id number]  PRIMARY CARE PROVIDER: Janith Lima, MD  RESPONSIBLE PARTY:  Acct ID - Guarantor Home Phone Work Phone Relationship Acct Type  [id number] - [place],[phone]  Self P/F     [address], [place], [place], [place] [address]   Due to the COVID-19 crisis, this virtual check-in visit was done via telephone from my office and it was initiated and consent by this patientand orfamily.  PLAN OF CARE and INTERVENTIONS:             1. GOALS OF CARE/ ADVANCE CARE PLANNING:  Patient's goal is to remain at home with her daughter, Michelle Lopez, and her caregiver.  Patient is a full code. 2. SOCIAL/EMOTIONAL/SPIRITUAL ASSESSMENT/ INTERVENTIONS:  SW conducted a Sales executive visit with patient's daughter, Michelle Lopez.  SW left patient's other daughter, Michelle Lopez, a vm.  Patient is doing well per Michelle Lopez.  Her "sugars" are being managed.  Her appetite is very good and patient is sleeping well.  Michelle Lopez had no concerns at this time. 3. PATIENT/CAREGIVER EDUCATION/ COPING:  Daughters cope by problem-solving. 4. PERSONAL EMERGENCY PLAN:  Family will contact EMS.  5. COMMUNITY RESOURCES COORDINATION/ HEALTH CARE NAVIGATION:  Patient caregivers nine hours a day. 6. FINANCIAL/LEGAL CONCERNS/INTERVENTIONS:  She is on a fixed income.     SOCIAL HX:  Social History   Tobacco Use  . Smoking status: Never Smoker  . Smokeless tobacco: Never Used  Substance Use Topics  . Alcohol use: No    Alcohol/week: 0.0 standard drinks    CODE STATUS:  Full Code ADVANCED DIRECTIVES: N MOST FORM COMPLETE: N HOSPICE EDUCATION PROVIDED: N PPS:  Michelle Lopez reports a good appetite.  Patient ambulates independently  In her home. Duration of visit and documentation:  30 minutes.      Creola Corn Markies Mowatt, LCSW

## 2019-03-17 ENCOUNTER — Other Ambulatory Visit: Payer: Self-pay | Admitting: Internal Medicine

## 2019-03-17 DIAGNOSIS — E1121 Type 2 diabetes mellitus with diabetic nephropathy: Secondary | ICD-10-CM

## 2019-03-17 DIAGNOSIS — I10 Essential (primary) hypertension: Secondary | ICD-10-CM

## 2019-03-20 ENCOUNTER — Other Ambulatory Visit: Payer: Self-pay | Admitting: *Deleted

## 2019-03-20 NOTE — Patient Outreach (Signed)
Outreach call to pt for telephone assessment, no answer to telephone and no option to leave voicemail.  RN CM mailed unsuccessful outreach letter to pt home.  Care of pt will be reassigned due to change in RN CM role.  PLAN Outreach pt in 3-4 business days  [name] [facility], [place] [phone]

## 2019-03-26 ENCOUNTER — Other Ambulatory Visit: Payer: Self-pay | Admitting: *Deleted

## 2019-03-26 ENCOUNTER — Encounter: Payer: Self-pay | Admitting: *Deleted

## 2019-03-26 NOTE — Patient Outreach (Signed)
Telephone outreach to follow up on diabetes management.  Talked with pt's daughter [name] today. She is out doing errands and does not have access to her mother's glucose log, however, she says that her mother's levels are within the normal range in am and at hs. She is not having any problems.  We agreed I would call on March 1st which will be after her first visit with her new endocrinologist.  Kayleen Memos C. Myrtie Neither, MSN, [facility] Gerontological Nurse Practitioner [facility] Care Management [phone]

## 2019-03-31 ENCOUNTER — Telehealth: Payer: Self-pay | Admitting: *Deleted

## 2019-03-31 NOTE — Telephone Encounter (Signed)
Called and spoke with patient's daughter, Michelle Lopez, to arrange a Palliative care visit. Visit scheduled for 04/01/19@ 11a.

## 2019-04-01 ENCOUNTER — Telehealth: Payer: Self-pay

## 2019-04-01 ENCOUNTER — Other Ambulatory Visit: Payer: Self-pay

## 2019-04-01 ENCOUNTER — Other Ambulatory Visit: Payer: Medicare Other | Admitting: *Deleted

## 2019-04-01 DIAGNOSIS — Z515 Encounter for palliative care: Secondary | ICD-10-CM

## 2019-04-01 NOTE — Telephone Encounter (Signed)
LVM for pt to call back as soon as possible.   

## 2019-04-01 NOTE — Telephone Encounter (Signed)
Monicia with [facility] calling and states that she is leaving from visiting with patient. States that she is concerned because the patient's heart rate is elevated at 115 BPM and it is unusually 60-80BPM. Also, with just walking from bedroom to kitchen (75ft) states that patient is sweating pretty hard. Typically she is cold.  Said that she started with a non productive cough 2 days ago. States that she also has a runny nose, seems weaker, and complains of aching all over. States that the patient had a fall yesterday (03/31/2019) and EMS came out and checked her out. Said they did not think anything was broken and did not take her in to be evaluated.  Please advise. CB#:

## 2019-04-02 NOTE — Progress Notes (Signed)
COMMUNITY PALLIATIVE CARE RN NOTE  PATIENT NAME: Michelle Lopez DOB: April 20, 1945 MRN: [id number]  PRIMARY CARE PROVIDER: Janith Lima, MD  RESPONSIBLE PARTY: [name] (daughter) Acct ID - Guarantor Home Phone Work Phone Relationship Acct Type  [id number] - Drakes,[phone]  Self P/F     [address], [address], [place], [place] [address]   Covid-19 Pre-screening Negative  PLAN OF CARE and INTERVENTION:  1. ADVANCE CARE PLANNING/GOALS OF CARE: Goal is for patient to remain at home. 2. PATIENT/CAREGIVER EDUCATION: Pain management, symptom management 3. DISEASE STATUS: Met with patient, her daughter Renold Don, and caregiver. Upon arrival, patient is ambulating into the living room using her walker. She is ambulating at a slower pace. Her daughter states that she has been more stationary and has become weaker. She had a fall last pm on 03/31/19 while trying to walk to the bathroom. EMS was contacted, assessed patient and did not feel patient had any fractures or needed to go to the ED. Patient denies any injuries from this fall. Prior to this fall, patient has been c/o generalized pain, but more noticeable in her arms and legs. This also may be interfering with her ability to walk. She is currently not taking any medications for pain, so family is going to try Ibuprofen to see if this will help. Patient noted to be sweating after only walking 20 ft and skin is cool to touch. She is usually c/o being cold, but not this visit. She is afebrile. She has developed a non-productive cough over the past 2 days. Her nose is runny, however her lungs are clear to auscultation. Her heart rate is elevated at 115, but is usually in the 60s-80s. She has an appointment with her PCP on Friday 04/04/19 at 11:20a. She reports not having much of an appetite, usually for breakfast. She did eat 50% of her breakfast sandwich, drank her coffee and took her daily medications. She says she is only eating because she  knows she needs to. Daughter states that she usually eats better for lunch and dinner. No dysphagia noted or reported. Her CBG today is 137. She requires some assistance with bathing and dressing. She is continent of both bowel and bladder. She is having difficulties sleeping during the night, however does take long naps during the day.  I did contact Dr. Ronnald Ramp office and made aware of patient's condition today. Will continue to monitor.  HISTORY OF PRESENT ILLNESS:  This is a 74 yo female with a history of CVA, PAD, HTN, DM. Palliative care continues to follow patient. Will continue to visit patient monthly and PRN.  CODE STATUS: Full Code  ADVANCED DIRECTIVES: Y MOST FORM: no PPS: 40%   PHYSICAL EXAM:   VITALS: Today's Vitals   04/01/19 1120  BP: 107/67  Pulse: (!) 115  Resp: 20  Temp: (!) 97.5 F (36.4 C)  TempSrc: Temporal  SpO2: 91%  PainSc: 5   PainLoc: Generalized    LUNGS: clear to auscultation  CARDIAC: Cor Tachy EXTREMITIES: No edema SKIN: Skin is cool to touch: Exposed skin is intact  NEURO: Alert and oriented x 3, generalized weakness, left sided weakness, ambulatory w/walker   (Duration of visit and documentation 75 minutes)   Daryl Eastern, RN BSN

## 2019-04-03 ENCOUNTER — Emergency Department (HOSPITAL_COMMUNITY): Payer: Medicare Other

## 2019-04-03 ENCOUNTER — Encounter (HOSPITAL_COMMUNITY): Payer: Self-pay | Admitting: Emergency Medicine

## 2019-04-03 ENCOUNTER — Other Ambulatory Visit: Payer: Self-pay

## 2019-04-03 ENCOUNTER — Inpatient Hospital Stay (HOSPITAL_COMMUNITY): Payer: Medicare Other

## 2019-04-03 ENCOUNTER — Inpatient Hospital Stay (HOSPITAL_COMMUNITY)
Admission: EM | Admit: 2019-04-03 | Discharge: 2019-04-08 | DRG: 177 | Disposition: A | Discharge status: Skilled Nursing Facility | Payer: Medicare Other | Attending: Family Medicine | Admitting: Family Medicine

## 2019-04-03 DIAGNOSIS — Z794 Long term (current) use of insulin: Secondary | ICD-10-CM

## 2019-04-03 DIAGNOSIS — Z96649 Presence of unspecified artificial hip joint: Secondary | ICD-10-CM | POA: Diagnosis present

## 2019-04-03 DIAGNOSIS — R41841 Cognitive communication deficit: Secondary | ICD-10-CM | POA: Diagnosis not present

## 2019-04-03 DIAGNOSIS — R0602 Shortness of breath: Secondary | ICD-10-CM | POA: Diagnosis not present

## 2019-04-03 DIAGNOSIS — I69354 Hemiplegia and hemiparesis following cerebral infarction affecting left non-dominant side: Secondary | ICD-10-CM | POA: Diagnosis not present

## 2019-04-03 DIAGNOSIS — R0902 Hypoxemia: Secondary | ICD-10-CM | POA: Diagnosis not present

## 2019-04-03 DIAGNOSIS — E1121 Type 2 diabetes mellitus with diabetic nephropathy: Secondary | ICD-10-CM

## 2019-04-03 DIAGNOSIS — R531 Weakness: Secondary | ICD-10-CM | POA: Diagnosis not present

## 2019-04-03 DIAGNOSIS — Z79899 Other long term (current) drug therapy: Secondary | ICD-10-CM | POA: Diagnosis not present

## 2019-04-03 DIAGNOSIS — N179 Acute kidney failure, unspecified: Secondary | ICD-10-CM | POA: Diagnosis present

## 2019-04-03 DIAGNOSIS — Z7401 Bed confinement status: Secondary | ICD-10-CM | POA: Diagnosis not present

## 2019-04-03 DIAGNOSIS — E785 Hyperlipidemia, unspecified: Secondary | ICD-10-CM | POA: Diagnosis present

## 2019-04-03 DIAGNOSIS — E875 Hyperkalemia: Secondary | ICD-10-CM | POA: Diagnosis present

## 2019-04-03 DIAGNOSIS — R1312 Dysphagia, oropharyngeal phase: Secondary | ICD-10-CM | POA: Diagnosis not present

## 2019-04-03 DIAGNOSIS — K219 Gastro-esophageal reflux disease without esophagitis: Secondary | ICD-10-CM | POA: Diagnosis present

## 2019-04-03 DIAGNOSIS — J9601 Acute respiratory failure with hypoxia: Secondary | ICD-10-CM | POA: Diagnosis present

## 2019-04-03 DIAGNOSIS — R7989 Other specified abnormal findings of blood chemistry: Secondary | ICD-10-CM | POA: Diagnosis not present

## 2019-04-03 DIAGNOSIS — M2550 Pain in unspecified joint: Secondary | ICD-10-CM | POA: Diagnosis not present

## 2019-04-03 DIAGNOSIS — E11649 Type 2 diabetes mellitus with hypoglycemia without coma: Secondary | ICD-10-CM | POA: Diagnosis not present

## 2019-04-03 DIAGNOSIS — Z7902 Long term (current) use of antithrombotics/antiplatelets: Secondary | ICD-10-CM

## 2019-04-03 DIAGNOSIS — I10 Essential (primary) hypertension: Secondary | ICD-10-CM | POA: Diagnosis present

## 2019-04-03 DIAGNOSIS — U071 COVID-19: Secondary | ICD-10-CM | POA: Diagnosis present

## 2019-04-03 DIAGNOSIS — E1169 Type 2 diabetes mellitus with other specified complication: Secondary | ICD-10-CM

## 2019-04-03 DIAGNOSIS — R05 Cough: Secondary | ICD-10-CM | POA: Diagnosis not present

## 2019-04-03 DIAGNOSIS — R2681 Unsteadiness on feet: Secondary | ICD-10-CM | POA: Diagnosis not present

## 2019-04-03 DIAGNOSIS — R498 Other voice and resonance disorders: Secondary | ICD-10-CM | POA: Diagnosis not present

## 2019-04-03 DIAGNOSIS — Z7982 Long term (current) use of aspirin: Secondary | ICD-10-CM

## 2019-04-03 DIAGNOSIS — M6281 Muscle weakness (generalized): Secondary | ICD-10-CM | POA: Diagnosis not present

## 2019-04-03 LAB — COMPREHENSIVE METABOLIC PANEL
ALT: 20 U/L (ref 0–44)
AST: 36 U/L (ref 15–41)
Albumin: 3.7 g/dL (ref 3.5–5.0)
Alkaline Phosphatase: 61 U/L (ref 38–126)
Anion gap: 12 (ref 5–15)
BUN: 49 mg/dL — ABNORMAL HIGH (ref 8–23)
CO2: 22 mmol/L (ref 22–32)
Calcium: 9.1 mg/dL (ref 8.9–10.3)
Chloride: 106 mmol/L (ref 98–111)
Creatinine, Ser: 2.15 mg/dL — ABNORMAL HIGH (ref 0.44–1.00)
GFR calc Af Amer: 25 mL/min — ABNORMAL LOW (ref >60)
GFR calc non Af Amer: 22 mL/min — ABNORMAL LOW (ref >60)
Glucose, Bld: 159 mg/dL — ABNORMAL HIGH (ref 70–99)
Potassium: 4 mmol/L (ref 3.5–5.1)
Sodium: 140 mmol/L (ref 135–145)
Total Bilirubin: 0.8 mg/dL (ref 0.3–1.2)
Total Protein: 8.1 g/dL (ref 6.5–8.1)

## 2019-04-03 LAB — PROTIME-INR
INR: 0.9 (ref 0.8–1.2)
Prothrombin Time: 12.3 seconds (ref 11.4–15.2)

## 2019-04-03 LAB — URINALYSIS, ROUTINE W REFLEX MICROSCOPIC
Bilirubin Urine: NEGATIVE
Glucose, UA: NEGATIVE mg/dL
Ketones, ur: 5 mg/dL — AB
Leukocytes,Ua: NEGATIVE
Nitrite: NEGATIVE
Protein, ur: NEGATIVE mg/dL
Specific Gravity, Urine: 1.016 (ref 1.005–1.030)
pH: 5 (ref 5.0–8.0)

## 2019-04-03 LAB — CBC WITH DIFFERENTIAL/PLATELET
Abs Immature Granulocytes: 0.01 10*3/uL (ref 0.00–0.07)
Basophils Absolute: 0.1 10*3/uL (ref 0.0–0.1)
Basophils Relative: 1 %
Eosinophils Absolute: 0 10*3/uL (ref 0.0–0.5)
Eosinophils Relative: 0 %
HCT: 47.3 % — ABNORMAL HIGH (ref 36.0–46.0)
Hemoglobin: 14.4 g/dL (ref 12.0–15.0)
Immature Granulocytes: 0 %
Lymphocytes Relative: 23 %
Lymphs Abs: 1.2 10*3/uL (ref 0.7–4.0)
MCH: 25.8 pg — ABNORMAL LOW (ref 26.0–34.0)
MCHC: 30.4 g/dL (ref 30.0–36.0)
MCV: 84.6 fL (ref 80.0–100.0)
Monocytes Absolute: 0.7 10*3/uL (ref 0.1–1.0)
Monocytes Relative: 13 %
Neutro Abs: 3.2 10*3/uL (ref 1.7–7.7)
Neutrophils Relative %: 63 %
Platelets: 162 10*3/uL (ref 150–400)
RBC: 5.59 MIL/uL — ABNORMAL HIGH (ref 3.87–5.11)
RDW: 12.8 % (ref 11.5–15.5)
WBC: 5.1 10*3/uL (ref 4.0–10.5)
nRBC: 0 % (ref 0.0–0.2)

## 2019-04-03 LAB — HEMOGLOBIN A1C
Hgb A1c MFr Bld: 7.6 % — ABNORMAL HIGH (ref 4.8–5.6)
Mean Plasma Glucose: 171.42 mg/dL

## 2019-04-03 LAB — LACTIC ACID, PLASMA
Lactic Acid, Venous: 1.2 mmol/L (ref 0.5–1.9)
Lactic Acid, Venous: 2.9 mmol/L (ref 0.5–1.9)

## 2019-04-03 LAB — POC SARS CORONAVIRUS 2 AG -  ED: SARS Coronavirus 2 Ag: POSITIVE — AB

## 2019-04-03 LAB — D-DIMER, QUANTITATIVE: D-Dimer, Quant: 1.55 ug/mL-FEU — ABNORMAL HIGH (ref 0.00–0.50)

## 2019-04-03 LAB — GLUCOSE, CAPILLARY
Glucose-Capillary: 170 mg/dL — ABNORMAL HIGH (ref 70–99)
Glucose-Capillary: 231 mg/dL — ABNORMAL HIGH (ref 70–99)

## 2019-04-03 LAB — APTT: aPTT: 27 seconds (ref 24–36)

## 2019-04-03 LAB — CK: Total CK: 327 U/L — ABNORMAL HIGH (ref 38–234)

## 2019-04-03 MED ORDER — LOSARTAN POTASSIUM-HCTZ 100-12.5 MG PO TABS: 1.0000 | ORAL_TABLET | Freq: Every day | ORAL | Status: DC

## 2019-04-03 MED ORDER — LACTATED RINGERS IV SOLN: INTRAVENOUS | Status: AC

## 2019-04-03 MED ORDER — ASPIRIN 81 MG PO CHEW
81.0000 mg | CHEWABLE_TABLET | Freq: Every day | ORAL | Status: DC
  Administered 2019-04-03 – 2019-04-08 (×6): 81 mg via ORAL
  Filled 2019-04-03 (×7): qty 1

## 2019-04-03 MED ORDER — INSULIN ASPART 100 UNIT/ML ~~LOC~~ SOLN
0.0000 [IU] | Freq: Three times a day (TID) | SUBCUTANEOUS | Status: DC
  Administered 2019-04-03 (×2): 3 [IU] via SUBCUTANEOUS

## 2019-04-03 MED ORDER — LORATADINE 10 MG PO TABS
10.0000 mg | ORAL_TABLET | Freq: Every day | ORAL | Status: DC
  Administered 2019-04-04 – 2019-04-08 (×5): 10 mg via ORAL
  Filled 2019-04-03 (×7): qty 1

## 2019-04-03 MED ORDER — SODIUM CHLORIDE 0.9 % IV SOLN: INTRAVENOUS | Status: DC

## 2019-04-03 MED ORDER — IPRATROPIUM BROMIDE HFA 17 MCG/ACT IN AERS
2.0000 | INHALATION_SPRAY | Freq: Four times a day (QID) | RESPIRATORY_TRACT | Status: DC
  Administered 2019-04-04 – 2019-04-08 (×16): 2 via RESPIRATORY_TRACT
  Filled 2019-04-03 (×2): qty 12.9

## 2019-04-03 MED ORDER — VITAMIN D (ERGOCALCIFEROL) 1.25 MG (50000 UNIT) PO CAPS
50000.0000 [IU] | ORAL_CAPSULE | ORAL | Status: DC
  Administered 2019-04-04: 50000 [IU] via ORAL
  Filled 2019-04-03: qty 1

## 2019-04-03 MED ORDER — ZINC SULFATE 220 (50 ZN) MG PO CAPS
220.0000 mg | ORAL_CAPSULE | Freq: Every day | ORAL | Status: DC
  Administered 2019-04-03 – 2019-04-08 (×6): 220 mg via ORAL
  Filled 2019-04-03 (×7): qty 1

## 2019-04-03 MED ORDER — GLUCERNA SHAKE PO LIQD
237.0000 mL | Freq: Three times a day (TID) | ORAL | Status: DC
  Administered 2019-04-07: 237 mL via ORAL
  Filled 2019-04-03: qty 237

## 2019-04-03 MED ORDER — HEPARIN SODIUM (PORCINE) 5000 UNIT/ML IJ SOLN
5000.0000 [IU] | Freq: Two times a day (BID) | INTRAMUSCULAR | Status: DC
  Administered 2019-04-03 – 2019-04-04 (×3): 5000 [IU] via SUBCUTANEOUS
  Filled 2019-04-03 (×3): qty 1

## 2019-04-03 MED ORDER — GLUCERNA PO LIQD: 1.0000 | Freq: Two times a day (BID) | ORAL | Status: DC

## 2019-04-03 MED ORDER — INSULIN GLARGINE (2 UNIT DIAL) 300 UNIT/ML ~~LOC~~ SOPN: 36.0000 [IU] | PEN_INJECTOR | Freq: Every day | SUBCUTANEOUS | Status: DC

## 2019-04-03 MED ORDER — ATORVASTATIN CALCIUM 10 MG PO TABS
20.0000 mg | ORAL_TABLET | Freq: Every day | ORAL | Status: DC
  Administered 2019-04-03 – 2019-04-08 (×6): 20 mg via ORAL
  Filled 2019-04-03 (×6): qty 2

## 2019-04-03 MED ORDER — HYDROCHLOROTHIAZIDE 12.5 MG PO CAPS
12.5000 mg | ORAL_CAPSULE | Freq: Every day | ORAL | Status: DC
  Filled 2019-04-03: qty 1

## 2019-04-03 MED ORDER — ASCORBIC ACID 500 MG PO TABS
500.0000 mg | ORAL_TABLET | Freq: Every day | ORAL | Status: DC
  Administered 2019-04-03 – 2019-04-08 (×6): 500 mg via ORAL
  Filled 2019-04-03 (×7): qty 1

## 2019-04-03 MED ORDER — SODIUM CHLORIDE 0.9 % IV SOLN
200.0000 mg | Freq: Once | INTRAVENOUS | Status: AC
  Administered 2019-04-03: 200 mg via INTRAVENOUS
  Filled 2019-04-03: qty 40

## 2019-04-03 MED ORDER — ACETAMINOPHEN 325 MG PO TABS
650.0000 mg | ORAL_TABLET | Freq: Four times a day (QID) | ORAL | Status: DC | PRN
  Administered 2019-04-03 – 2019-04-04 (×2): 650 mg via ORAL
  Filled 2019-04-03 (×2): qty 2

## 2019-04-03 MED ORDER — TAMSULOSIN HCL 0.4 MG PO CAPS
0.4000 mg | ORAL_CAPSULE | Freq: Every day | ORAL | Status: DC
  Administered 2019-04-03 – 2019-04-08 (×6): 0.4 mg via ORAL
  Filled 2019-04-03 (×7): qty 1

## 2019-04-03 MED ORDER — METOPROLOL TARTRATE 25 MG PO TABS
12.5000 mg | ORAL_TABLET | Freq: Two times a day (BID) | ORAL | Status: DC
  Administered 2019-04-03 – 2019-04-08 (×11): 12.5 mg via ORAL
  Filled 2019-04-03 (×11): qty 1

## 2019-04-03 MED ORDER — SODIUM CHLORIDE 0.9 % IV BOLUS
250.0000 mL | Freq: Once | INTRAVENOUS | Status: AC
  Administered 2019-04-03: 250 mL via INTRAVENOUS

## 2019-04-03 MED ORDER — DEXAMETHASONE 6 MG PO TABS
6.0000 mg | ORAL_TABLET | Freq: Every day | ORAL | Status: DC
  Administered 2019-04-03 – 2019-04-04 (×2): 6 mg via ORAL
  Filled 2019-04-03: qty 1
  Filled 2019-04-03: qty 2
  Filled 2019-04-03: qty 1

## 2019-04-03 MED ORDER — AMLODIPINE BESYLATE 5 MG PO TABS
5.0000 mg | ORAL_TABLET | Freq: Every day | ORAL | Status: DC
  Administered 2019-04-03 – 2019-04-08 (×6): 5 mg via ORAL
  Filled 2019-04-03 (×7): qty 1

## 2019-04-03 MED ORDER — SODIUM CHLORIDE 0.9 % IV SOLN
100.0000 mg | Freq: Every day | INTRAVENOUS | Status: AC
  Administered 2019-04-04 – 2019-04-07 (×4): 100 mg via INTRAVENOUS
  Filled 2019-04-03 (×6): qty 20

## 2019-04-03 MED ORDER — INSULIN GLARGINE 100 UNIT/ML ~~LOC~~ SOLN
36.0000 [IU] | Freq: Every day | SUBCUTANEOUS | Status: DC
  Administered 2019-04-03 – 2019-04-04 (×2): 36 [IU] via SUBCUTANEOUS
  Filled 2019-04-03 (×3): qty 0.36

## 2019-04-03 MED ORDER — LOSARTAN POTASSIUM 25 MG PO TABS
100.0000 mg | ORAL_TABLET | Freq: Every day | ORAL | Status: DC
  Filled 2019-04-03: qty 2

## 2019-04-03 MED ORDER — DOCUSATE SODIUM 100 MG PO CAPS
100.0000 mg | ORAL_CAPSULE | Freq: Two times a day (BID) | ORAL | Status: DC
  Administered 2019-04-03 – 2019-04-08 (×9): 100 mg via ORAL
  Filled 2019-04-03 (×10): qty 1

## 2019-04-03 MED ORDER — CLOPIDOGREL BISULFATE 75 MG PO TABS
75.0000 mg | ORAL_TABLET | Freq: Every day | ORAL | Status: DC
  Administered 2019-04-03 – 2019-04-08 (×6): 75 mg via ORAL
  Filled 2019-04-03 (×7): qty 1

## 2019-04-03 NOTE — Telephone Encounter (Signed)
Called number on file for pt and spoke to Bowles. She requested that contact pt other dtr and speak to her, as she is currently with patient.   Called [phone] - however, no answer. LVM for her to call me back.

## 2019-04-03 NOTE — ED Triage Notes (Signed)
Pt c.o. generalized body aches, nasal congestion and cough for 3 days. Denies N/V/D/SOB.

## 2019-04-03 NOTE — ED Notes (Signed)
Admitting provider paged about code sepsis status and if antibiotics are needed, awaiting response.

## 2019-04-03 NOTE — Telephone Encounter (Signed)
F/u   Daughter [name] calling back to speak with CMA.

## 2019-04-03 NOTE — Telephone Encounter (Signed)
Called Michelle Lopez and she stated that patient is not able to walk any more and pt is telling them that her whole body hurts. Pt is not able to move at all. Michelle Lopez stated that patient has had a lot of recent falls.   I have instructed her to take pt to the hospital for several reasons: Past stroke history, pain, falls and due to not feeling well (pt has a "cold").   Michelle Lopez stated understanding and agreed!

## 2019-04-03 NOTE — H&P (Signed)
History and Physical    Michelle Lopez EUM:[id number] DOB: 09-04-1945 DOA: 04/03/2019  PCP: Janith Lima, MD   Patient coming from: Home  I have personally briefly reviewed patient's old medical records in [facility]  Chief Complaint: Cough and SOB  HPI: Michelle Lopez is a 74 y.o. female with medical history significant of CVA with residual left-sided weakness, IDDM, hypertension and hyperlipidemia presented with dry cough, pleuritic chest pain, and increasing short of breath for 3 days.    Her symptoms started 3 days ago, with generalized weakness and aching.  Gradually she developed short of breath and dry cough.  Denies loss of taste, nausea vomiting or diarrhea, denies sore throat runny nose, no fever but episodes of chills.  She lives with her daughter, and she denied any known COVID-19 contact. ED Course: COVID-19 positive, tachypneic, borderline hypoxia, stabilized with 2 L oxygen.  Review of Systems: As per HPI otherwise 10 point review of systems negative.    Past Medical History:  Diagnosis Date  . Cerebrovascular disease, unspecified   . Cocaine abuse, unspecified   . Diabetes mellitus    type 2, uncontrolled  . Diarrhea   . Essential hypertension, benign   . Hyperlipidemia   . Stroke [facility])    x7    Past Surgical History:  Procedure Laterality Date  . TOTAL HIP ARTHROPLASTY  1970   Dr. Rodell Perna     reports that she has never smoked. She has never used smokeless tobacco. She reports that she does not drink alcohol or use drugs.  Allergies  Allergen Reactions  . Crestor [Rosuvastatin Calcium]     myalgias  . Morphine Hives  . Pravastatin Sodium Rash  . Simvastatin Rash    Family History  Problem Relation Age of Onset  . Colon cancer Neg Hx     Prior to Admission medications   Medication Sig Start Date End Date Taking? Authorizing Provider  amLODipine (NORVASC) 5 MG tablet TAKE 1 TABLET BY MOUTH EVERY DAY Patient taking differently: Take 5 mg  by mouth daily.  01/06/19  Yes Janith Lima, MD  aspirin 81 MG tablet Take 81 mg by mouth daily.     Yes [provider]  atorvastatin (LIPITOR) 20 MG tablet TAKE 1 TABLET BY MOUTH EVERY DAY Patient taking differently: Take 20 mg by mouth daily.  11/06/18  Yes Janith Lima, MD  cetirizine (ZYRTEC) 10 MG tablet Take 1 tablet (10 mg total) by mouth daily. 01/06/19  Yes Janith Lima, MD  clopidogrel (PLAVIX) 75 MG tablet TAKE 1 TABLET BY MOUTH EVERY DAY Patient taking differently: Take 75 mg by mouth daily.  11/06/18  Yes Janith Lima, MD  Glucerna [facility]) LIQD Take 1 Can by mouth 2 (two) times daily between meals. 05/23/18  Yes Janith Lima, MD  ibuprofen (ADVIL) 200 MG tablet Take 200 mg by mouth every 6 (six) hours as needed for mild pain.   Yes [provider]  insulin aspart (NOVOLOG) 100 UNIT/ML FlexPen Inject 5 Units into the skin 3 (three) times daily with meals. 12/31/18  Yes Janith Lima, MD  Insulin Glargine, 2 Unit Dial, (TOUJEO MAX SOLOSTAR) 300 UNIT/ML SOPN Inject 36 Units into the skin daily. 11/26/18  Yes Burns, Claudina Lick, MD  losartan-hydrochlorothiazide (HYZAAR) 100-12.5 MG tablet TAKE 1 TABLET BY MOUTH EVERY DAY Patient taking differently: Take 1 tablet by mouth daily.  03/17/19  Yes Janith Lima, MD  metoprolol tartrate (LOPRESSOR) 25 MG  tablet TAKE 1/2 TABLET TWICE A DAY Patient taking differently: Take 12.5 mg by mouth 2 (two) times daily.  11/06/18  Yes Janith Lima, MD  RESTASIS 0.05 % ophthalmic emulsion Place 1 drop into both eyes daily. 11/06/18  Yes [provider]  tamsulosin (FLOMAX) 0.4 MG CAPS capsule Take 1 capsule (0.4 mg total) by mouth daily. 01/06/19  Yes Janith Lima, MD  Vitamin D, Ergocalciferol, (DRISDOL) 1.25 MG (50000 UT) CAPS capsule Take 1 capsule (50,000 Units total) by mouth every 7 (seven) days. TAKE 1 CAPSULE BY MOUTH EVERY 7 DAYS 04/15/18  Yes Janith Lima, MD  Blood Glucose Monitoring Suppl (CONTOUR  NEXT EZ) w/Device KIT 1 Act by Does not apply route 2 (two) times daily. 02/14/18   Janith Lima, MD  clotrimazole-betamethasone (LOTRISONE) cream Apply 1 application topically 2 (two) times daily. Patient not taking: Reported on 04/03/2019 12/24/18   Janith Lima, MD  glucose blood [facility] VERIO) test strip Use to check blood sugars twice a day 02/14/18   Janith Lima, MD  Insulin Pen Needle (NOVOFINE) 32G X 6 MM MISC Inject 1 Act into the skin daily. 01/06/19   Janith Lima, MD  Lancets [facility] ULTRASOFT) lancets Use to check blood sugars twice a day 08/25/16   Janith Lima, MD    Physical Exam: Vitals:   04/03/19 1115 04/03/19 1130 04/03/19 1147 04/03/19 1212  BP: 133/60     Pulse: (!) 101 (!) 101  (!) 102  Resp: (!) 23 (!) 25  (!) 26  Temp:      TempSrc:      SpO2: 98% 97%  97%  Weight:   70.3 kg   Height:   '5\' 3"'  (1.6 m)     Constitutional: NAD, calm, comfortable Vitals:   04/03/19 1115 04/03/19 1130 04/03/19 1147 04/03/19 1212  BP: 133/60     Pulse: (!) 101 (!) 101  (!) 102  Resp: (!) 23 (!) 25  (!) 26  Temp:      TempSrc:      SpO2: 98% 97%  97%  Weight:   70.3 kg   Height:   '5\' 3"'  (1.6 m)    Eyes: PERRL, lids and conjunctivae normal ENMT: Mucous membranes are moist. Posterior pharynx clear of any exudate or lesions.Normal dentition.  Neck: normal, supple, no masses, no thyromegaly Respiratory: Coarse breathing sound, bilateral crackles, no wheezing.  Increased respiratory effort. No accessory muscle use.  Cardiovascular: Regular rate and rhythm, no murmurs / rubs / gallops. No extremity edema. 2+ pedal pulses. No carotid bruits.  Abdomen: no tenderness, no masses palpated. No hepatosplenomegaly. Bowel sounds positive.  Musculoskeletal: no clubbing / cyanosis. No joint deformity upper and lower extremities. Good ROM, no contractures. Normal muscle tone.  Skin: no rashes, lesions, ulcers. No induration Neurologic: CN 2-12 grossly intact. Sensation intact,  DTR normal. Strength 5/5 in all 4.  Psychiatric: Normal judgment and insight. Alert and oriented x 3. Normal mood.    Labs on Admission: I have personally reviewed following labs and imaging studies  CBC: Recent Labs  Lab 04/03/19 1048  WBC 5.1  NEUTROABS 3.2  HGB 14.4  HCT 47.3*  MCV 84.6  PLT 728   Basic Metabolic Panel: Recent Labs  Lab 04/03/19 1048  NA 140  K 4.0  CL 106  CO2 22  GLUCOSE 159*  BUN 49*  CREATININE 2.15*  CALCIUM 9.1   GFR: Estimated Creatinine Clearance: 20.9 mL/min (A) (by C-G formula  based on SCr of 2.15 mg/dL (H)). Liver Function Tests: Recent Labs  Lab 04/03/19 1048  AST 36  ALT 20  ALKPHOS 61  BILITOT 0.8  PROT 8.1  ALBUMIN 3.7   No results for input(s): LIPASE, AMYLASE in the last 168 hours. No results for input(s): AMMONIA in the last 168 hours. Coagulation Profile: Recent Labs  Lab 04/03/19 1048  INR 0.9   Cardiac Enzymes: No results for input(s): CKTOTAL, CKMB, CKMBINDEX, TROPONINI in the last 168 hours. BNP (last 3 results) No results for input(s): PROBNP in the last 8760 hours. HbA1C: No results for input(s): HGBA1C in the last 72 hours. CBG: No results for input(s): GLUCAP in the last 168 hours. Lipid Profile: No results for input(s): CHOL, HDL, LDLCALC, TRIG, CHOLHDL, LDLDIRECT in the last 72 hours. Thyroid Function Tests: No results for input(s): TSH, T4TOTAL, FREET4, T3FREE, THYROIDAB in the last 72 hours. Anemia Panel: No results for input(s): VITAMINB12, FOLATE, FERRITIN, TIBC, IRON, RETICCTPCT in the last 72 hours. Urine analysis:    Component Value Date/Time   COLORURINE YELLOW 07/31/2018 1127   APPEARANCEUR CLEAR 07/31/2018 1127   LABSPEC 1.020 07/31/2018 1127   PHURINE 5.5 07/31/2018 1127   GLUCOSEU NEGATIVE 07/31/2018 1127   HGBUR NEGATIVE 07/31/2018 1127   [place] 07/31/2018 1127   [place] 07/31/2018 1127   PROTEINUR NEGATIVE 03/18/2018 1514   UROBILINOGEN 0.2 07/31/2018  1127   NITRITE NEGATIVE 07/31/2018 1127   LEUKOCYTESUR SMALL (A) 07/31/2018 1127    Radiological Exams on Admission: DG Chest Port 1 View  Result Date: 04/03/2019 CLINICAL DATA:  Body aches, and sinus congestion and cough for 3 days. EXAM: PORTABLE CHEST 1 VIEW COMPARISON:  PA and lateral chest 02/25/2015. FINDINGS: The lung volumes are low. Lungs clear. No pneumothorax or pleural effusion. Heart size is normal. Atherosclerosis. No acute or focal bony abnormality. IMPRESSION: No acute disease. Atherosclerosis. Electronically Signed   By: Inge Rise M.D.   On: 04/03/2019 11:11    EKG: Independently reviewed.  No ST changes compared to previous EKG  Assessment/Plan Active Problems:   COVID-19 virus infection   COVID-19  COVID-19 infection with impending respiratory failure Tachypneic with increasing breathing effort and worsening of symptoms, suspect impending respiratory failure due to COVID-19 infection Start remdesivir and steroid If her breathing symptoms improved, likely can be discharged in the next 24 hours and complete infusion as outpatient. Encourage frequent proning.  AKI No urinary symptoms, check UA, CK level, renal ultrasound, suspect dehydration from poor oral intake. Gentle hydration for today repeat BMP in the morning  IDDM Sliding scale for now  Hypertension Controlled, continue home meds.  History of CVA Left-sided weakness chronic Aspirin and statin   DVT prophylaxis: Heparin subcu Code Status: Full code Family Communication: Left patient daughter message Disposition Plan: We can be discharged home within the next 24 hours if symptoms remain stable, and kidney function improves. Consults called: None Admission status: Telemetry admission   Lequita Halt MD [facility] Pager [phone]   04/03/2019, 1:40 PM

## 2019-04-03 NOTE — ED Provider Notes (Signed)
[facility] EMERGENCY DEPARTMENT Provider Note   CSN: [id number] Arrival date & time: 04/03/19  1018     History Chief Complaint  Patient presents with  . Generalized Body Aches    Michelle Lopez is a 74 y.o. female.  HPI   This patient is a 74 year old female, prior history of a stroke with left-sided weakness residually.  History of cocaine abuse, diabetes, hypertension and hyperlipidemia.  She presents with a complaint of hurting all over with an associated cough which has been going on for several days.  Onset was 3 days ago, gradually worsening, states that she hurts from her head to her toes but denies a specific headache or abdominal pain.  It is more a muscular pain.  She does not feel particularly short of breath at this time but when she walks she does get a little short of breath.  Denies nausea vomiting or diarrhea, denies sore throat runny nose or blurred vision.  She does have some baseline abnormal vision which she states is usual for her.  She also has some baseline weakness on the left which is usual for her after her stroke.  The patient has not been seen for this complaint, she lives with her daughter here locally, her daughter is not sick.  Past Medical History:  Diagnosis Date  . Cerebrovascular disease, unspecified   . Cocaine abuse, unspecified   . Diabetes mellitus    type 2, uncontrolled  . Diarrhea   . Essential hypertension, benign   . Hyperlipidemia   . Stroke [facility])    x7    Patient Active Problem List   Diagnosis Date Noted  . Tinea corporis 12/17/2018  . CRI (chronic renal insufficiency), stage 4 (severe) ([place]) 02/15/2018  . Protein-calorie malnutrition, mild ([place]) 10/17/2017  . B12 deficiency 08/23/2017  . Primary osteoarthritis of right hip 04/03/2017  . PAD (peripheral artery disease) ([place]) 07/24/2016  . Vitamin D deficiency 07/06/2015  . Osteopenia, senile 03/03/2014  . 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 Seborrheic dermatitis 03/05/2013  . Dysphagia as late effect of stroke 05/30/2011  . Dementia arising in the senium and presenium ([place]) 05/02/2011  . Routine general medical examination at a health care facility 05/02/2011  . Hyperlipidemia with target LDL less than 70   . Visit for screening mammogram 09/15/2010  . DEPRESSION 06/24/2009  . VERTIGO, CENTRAL 01/14/2009  . Insomnia 01/14/2009  . CVA (cerebral vascular accident) ([place]) 06/11/2008  . DM (diabetes mellitus), type 2 with renal complications ([place]) 28/00/3491  . Essential hypertension, benign 06/05/2007    Past Surgical History:  Procedure Laterality Date  . TOTAL HIP ARTHROPLASTY  1970   Dr. Rodell Perna     OB History   No obstetric history on file.     Family History  Problem Relation Age of Onset  . Colon cancer Neg Hx     Social History   Tobacco Use  . Smoking status: Never Smoker  . Smokeless tobacco: Never Used  Substance Use Topics  . Alcohol use: No    Alcohol/week: 0.0 standard drinks  . Drug use: No    Home Medications Prior to Admission medications   Medication Sig Start Date End Date Taking? Authorizing Provider  amLODipine (NORVASC) 5 MG tablet TAKE 1 TABLET BY MOUTH EVERY DAY 01/06/19   Janith Lima, MD  aspirin 81 MG tablet Take 81 mg by mouth daily.      [provider]  atorvastatin (  LIPITOR) 20 MG tablet TAKE 1 TABLET BY MOUTH EVERY DAY 11/06/18   Janith Lima, MD  Blood Glucose Monitoring Suppl (CONTOUR NEXT EZ) w/Device KIT 1 Act by Does not apply route 2 (two) times daily. 02/14/18   Janith Lima, MD  cetirizine (ZYRTEC) 10 MG tablet Take 1 tablet (10 mg total) by mouth daily. 01/06/19   Janith Lima, MD  clopidogrel (PLAVIX) 75 MG tablet TAKE 1 TABLET BY MOUTH EVERY DAY 11/06/18   Janith Lima, MD  clotrimazole-betamethasone (LOTRISONE) cream Apply 1 application topically 2 (two) times daily. 12/24/18   Janith Lima, MD  Glucerna [facility]) LIQD  Take 1 Can by mouth 2 (two) times daily between meals. 05/23/18   Janith Lima, MD  glucose blood [facility] VERIO) test strip Use to check blood sugars twice a day 02/14/18   Janith Lima, MD  insulin aspart (NOVOLOG) 100 UNIT/ML FlexPen Inject 5 Units into the skin 3 (three) times daily with meals. 12/31/18   Janith Lima, MD  Insulin Glargine, 2 Unit Dial, (TOUJEO MAX SOLOSTAR) 300 UNIT/ML SOPN Inject 36 Units into the skin daily. 11/26/18   Binnie Rail, MD  Insulin Pen Needle (NOVOFINE) 32G X 6 MM MISC Inject 1 Act into the skin daily. 01/06/19   Janith Lima, MD  Lancets [facility] ULTRASOFT) lancets Use to check blood sugars twice a day 08/25/16   Janith Lima, MD  losartan-hydrochlorothiazide [facility]) 100-12.5 MG tablet TAKE 1 TABLET BY MOUTH EVERY DAY 03/17/19   Janith Lima, MD  metoprolol tartrate (LOPRESSOR) 25 MG tablet TAKE 1/2 TABLET TWICE A DAY 11/06/18   Janith Lima, MD  tamsulosin (FLOMAX) 0.4 MG CAPS capsule Take 1 capsule (0.4 mg total) by mouth daily. 01/06/19   Janith Lima, MD  Vitamin D, Ergocalciferol, (DRISDOL) 1.25 MG (50000 UT) CAPS capsule Take 1 capsule (50,000 Units total) by mouth every 7 (seven) days. TAKE 1 CAPSULE BY MOUTH EVERY 7 DAYS 04/15/18   Janith Lima, MD    Allergies    Crestor [rosuvastatin calcium], Morphine, Pravastatin sodium, and Simvastatin  Review of Systems   Review of Systems  All other systems reviewed and are negative.   Physical Exam Updated Vital Signs BP 133/60   Pulse (!) 102   Temp (!) 100.7 F (38.2 C) (Oral)   Resp (!) 26   Ht 1.6 m ('5\' 3"' )   Wt 70.3 kg   SpO2 97%   BMI 27.46 kg/m   Physical Exam Vitals and nursing note reviewed.  Constitutional:      General: She is not in acute distress.    Appearance: She is well-developed.  HENT:     Head: Normocephalic and atraumatic.     Mouth/Throat:     Pharynx: No oropharyngeal exudate.  Eyes:     General: No scleral icterus.       Right eye: No  discharge.        Left eye: No discharge.     Conjunctiva/sclera: Conjunctivae normal.     Pupils: Pupils are equal, round, and reactive to light.  Neck:     Thyroid: No thyromegaly.     Vascular: No JVD.  Cardiovascular:     Rate and Rhythm: Normal rate and regular rhythm.     Heart sounds: Normal heart sounds. No murmur. No friction rub. No gallop.   Pulmonary:     Effort: Pulmonary effort is normal. No respiratory distress.  Breath sounds: Rales present. No wheezing.     Comments: Rales at the bases that clears with deep breathing, no tachypnea, no wheezing Abdominal:     General: Bowel sounds are normal. There is no distension.     Palpations: Abdomen is soft. There is no mass.     Tenderness: There is no abdominal tenderness.  Musculoskeletal:        General: No tenderness. Normal range of motion.     Cervical back: Normal range of motion and neck supple.  Lymphadenopathy:     Cervical: No cervical adenopathy.  Skin:    General: Skin is warm and dry.     Findings: No erythema or rash.  Neurological:     Mental Status: She is alert.     Coordination: Coordination normal.     Comments: Slight weakness in the left leg compared to the right, sensation seems to be intact diffusely, bilateral arms with normal strength, grips with normal strength, no facial droop  Psychiatric:        Behavior: Behavior normal.     ED Results / Procedures / Treatments   Labs (all labs ordered are listed, but only abnormal results are displayed) Labs Reviewed  CBC WITH DIFFERENTIAL/PLATELET - Abnormal; Notable for the following components:      Result Value   RBC 5.59 (*)    HCT 47.3 (*)    MCH 25.8 (*)    All other components within normal limits  POC SARS CORONAVIRUS 2 AG -  ED - Abnormal; Notable for the following components:   SARS Coronavirus 2 Ag POSITIVE (*)    All other components within normal limits  CULTURE, BLOOD (ROUTINE X 2)  CULTURE, BLOOD (ROUTINE X 2)  URINE CULTURE    LACTIC ACID, PLASMA  APTT  PROTIME-INR  LACTIC ACID, PLASMA  COMPREHENSIVE METABOLIC PANEL  URINALYSIS, ROUTINE W REFLEX MICROSCOPIC    EKG EKG Interpretation  Date/Time:  Thursday April 03 2019 10:31:10 EST Ventricular Rate:  104 PR Interval:    QRS Duration: 88 QT Interval:  330 QTC Calculation: 434 R Axis:   -46 Text Interpretation: Sinus tachycardia Left axis deviation Borderline T abnormalities, inferior leads since last tracing no significant change Confirmed by Noemi Chapel [phone]) on 04/03/2019 11:34:13 AM   Radiology DG Chest Port 1 View  Result Date: 04/03/2019 CLINICAL DATA:  Body aches, and sinus congestion and cough for 3 days. EXAM: PORTABLE CHEST 1 VIEW COMPARISON:  PA and lateral chest 02/25/2015. FINDINGS: The lung volumes are low. Lungs clear. No pneumothorax or pleural effusion. Heart size is normal. Atherosclerosis. No acute or focal bony abnormality. IMPRESSION: No acute disease. Atherosclerosis. Electronically Signed   By: Inge Rise M.D.   On: 04/03/2019 11:11    Procedures .Critical Care Performed by: Noemi Chapel, MD Authorized by: Noemi Chapel, MD   Critical care provider statement:    Critical care time (minutes):  35   Critical care time was exclusive of:  Separately billable procedures and treating other patients and teaching time   Critical care was necessary to treat or prevent imminent or life-threatening deterioration of the following conditions:  Respiratory failure   Critical care was time spent personally by me on the following activities:  Blood draw for specimens, development of treatment plan with patient or surrogate, discussions with consultants, evaluation of patient's response to treatment, examination of patient, obtaining history from patient or surrogate, ordering and performing treatments and interventions, ordering and review of laboratory studies, ordering and  review of radiographic studies, pulse oximetry,  re-evaluation of patient's condition and review of old charts   (including critical care time)  Medications Ordered in ED Medications - No data to display  ED Course  I have reviewed the triage vital signs and the nursing notes.  Pertinent labs & imaging results that were available during my care of the patient were reviewed by me and considered in my medical decision making (see chart for details).  Clinical Course as of Apr 02 1233  Thu Apr 03, 2019  1115 This patient drops to 86%, she is requiring 2 L of oxygen by nasal cannula   [BM]    Clinical Course User Index [BM] Noemi Chapel, MD   MDM Rules/Calculators/A&P                       The patient is mildly tachycardic, febrile to 100.7, normotensive at 119/63.  She is not tachypneic but does have an oxygen of around 92 to 93%.  At this time I would recommend a work-up for Covid as well as pneumonia and other causes of infection.  She does not have any signs of pulmonary embolism with regards to swelling in her legs and given her cough and fever I suspect this to be more infectious.  She is agreeable to the plan in the work-up.  Thankfully no signs of pneumonia but with increased O2 requirment and hypoxia with ambulation - likely having acute hypoxic respiratory failure due to Covid - needs O2 and admission  D/w Dr. Roosevelt Locks who will admit  Final Clinical Impression(s) / ED Diagnoses Final diagnoses:  Acute respiratory failure with hypoxia ([place])  COVID-19      Noemi Chapel, MD 04/03/19 1235

## 2019-04-03 NOTE — Progress Notes (Signed)
Seen on arrival to [facility] Notes she presented with myalgias, malaise Denies any SOB  Vitals:   04/03/19 1426 04/03/19 1508  BP: 139/61 136/61  Pulse:  (!) 112  Resp:  (!) 28  Temp:  98.8 F (37.1 C)  SpO2:  96%   NAD on exam, mild tachycardia, tachypneic.  S/NT/ND.  No LEE.  See previous H&P for details regarding plan, but briefly as noted below.  COVID-19 infection with impending respiratory failure CXR 2/25 without acute disease Steroids/remdesivir started on admission in setting of her tachypnea  Repeat CXR 2/26 I/O, daily weights IS, OOB, prone as able Follow d dimer  COVID-19 Labs  No results for input(s): DDIMER, FERRITIN, LDH, CRP in the last 72 hours.  No results found for: [place]  AKI Baseline 12/2018 was ~1.11 Follow with IVF Follow UA, renal US.  CK mildly elevated.   suspect dehydration from poor oral intake Hold thiazide and ARB  T2DM SSI + lantus, follow  Hypertension Controlled, continue home meds.  Hold thiazide and arb.  History of CVA Left-sided weakness chronic Aspirin and statin

## 2019-04-03 NOTE — ED Notes (Signed)
Pt unable to void at this time. 

## 2019-04-04 ENCOUNTER — Inpatient Hospital Stay (HOSPITAL_COMMUNITY): Payer: Medicare Other

## 2019-04-04 ENCOUNTER — Ambulatory Visit: Payer: Medicare Other | Admitting: Internal Medicine

## 2019-04-04 LAB — FERRITIN: Ferritin: 90 ng/mL (ref 11–307)

## 2019-04-04 LAB — CBC WITH DIFFERENTIAL/PLATELET
Abs Immature Granulocytes: 0.01 10*3/uL (ref 0.00–0.07)
Basophils Absolute: 0 10*3/uL (ref 0.0–0.1)
Basophils Relative: 1 %
Eosinophils Absolute: 0 10*3/uL (ref 0.0–0.5)
Eosinophils Relative: 0 %
HCT: 42.9 % (ref 36.0–46.0)
Hemoglobin: 13.2 g/dL (ref 12.0–15.0)
Immature Granulocytes: 0 %
Lymphocytes Relative: 22 %
Lymphs Abs: 0.7 10*3/uL (ref 0.7–4.0)
MCH: 26 pg (ref 26.0–34.0)
MCHC: 30.8 g/dL (ref 30.0–36.0)
MCV: 84.6 fL (ref 80.0–100.0)
Monocytes Absolute: 0.1 10*3/uL (ref 0.1–1.0)
Monocytes Relative: 3 %
Neutro Abs: 2.4 10*3/uL (ref 1.7–7.7)
Neutrophils Relative %: 74 %
Platelets: 151 10*3/uL (ref 150–400)
RBC: 5.07 MIL/uL (ref 3.87–5.11)
RDW: 13.2 % (ref 11.5–15.5)
WBC: 3.2 10*3/uL — ABNORMAL LOW (ref 4.0–10.5)
nRBC: 0 % (ref 0.0–0.2)

## 2019-04-04 LAB — COMPREHENSIVE METABOLIC PANEL
ALT: 21 U/L (ref 0–44)
AST: 50 U/L — ABNORMAL HIGH (ref 15–41)
Albumin: 3.2 g/dL — ABNORMAL LOW (ref 3.5–5.0)
Alkaline Phosphatase: 45 U/L (ref 38–126)
Anion gap: 10 (ref 5–15)
BUN: 35 mg/dL — ABNORMAL HIGH (ref 8–23)
CO2: 22 mmol/L (ref 22–32)
Calcium: 8.3 mg/dL — ABNORMAL LOW (ref 8.9–10.3)
Chloride: 104 mmol/L (ref 98–111)
Creatinine, Ser: 1.51 mg/dL — ABNORMAL HIGH (ref 0.44–1.00)
GFR calc Af Amer: 39 mL/min — ABNORMAL LOW (ref >60)
GFR calc non Af Amer: 33 mL/min — ABNORMAL LOW (ref >60)
Glucose, Bld: 258 mg/dL — ABNORMAL HIGH (ref 70–99)
Potassium: 6.1 mmol/L — ABNORMAL HIGH (ref 3.5–5.1)
Sodium: 136 mmol/L (ref 135–145)
Total Bilirubin: 1.4 mg/dL — ABNORMAL HIGH (ref 0.3–1.2)
Total Protein: 7 g/dL (ref 6.5–8.1)

## 2019-04-04 LAB — URINE CULTURE

## 2019-04-04 LAB — C-REACTIVE PROTEIN: CRP: 3.9 mg/dL — ABNORMAL HIGH (ref <1.0)

## 2019-04-04 LAB — GLUCOSE, CAPILLARY
Glucose-Capillary: 195 mg/dL — ABNORMAL HIGH (ref 70–99)
Glucose-Capillary: 133 mg/dL — ABNORMAL HIGH (ref 70–99)
Glucose-Capillary: 88 mg/dL (ref 70–99)
Glucose-Capillary: 154 mg/dL — ABNORMAL HIGH (ref 70–99)

## 2019-04-04 LAB — MAGNESIUM: Magnesium: 1.7 mg/dL (ref 1.7–2.4)

## 2019-04-04 LAB — LACTIC ACID, PLASMA: Lactic Acid, Venous: 2 mmol/L (ref 0.5–1.9)

## 2019-04-04 LAB — D-DIMER, QUANTITATIVE: D-Dimer, Quant: 1.79 ug/mL-FEU — ABNORMAL HIGH (ref 0.00–0.50)

## 2019-04-04 LAB — PHOSPHORUS: Phosphorus: 2.1 mg/dL — ABNORMAL LOW (ref 2.5–4.6)

## 2019-04-04 LAB — POTASSIUM: Potassium: 4.1 mmol/L (ref 3.5–5.1)

## 2019-04-04 MED ORDER — INSULIN ASPART 100 UNIT/ML ~~LOC~~ SOLN: 0.0000 [IU] | Freq: Every day | SUBCUTANEOUS | Status: DC

## 2019-04-04 MED ORDER — HEPARIN SODIUM (PORCINE) 5000 UNIT/ML IJ SOLN
5000.0000 [IU] | Freq: Three times a day (TID) | INTRAMUSCULAR | Status: DC
  Administered 2019-04-04 – 2019-04-08 (×10): 5000 [IU] via SUBCUTANEOUS
  Filled 2019-04-04 (×11): qty 1

## 2019-04-04 MED ORDER — INSULIN ASPART 100 UNIT/ML ~~LOC~~ SOLN
4.0000 [IU] | Freq: Three times a day (TID) | SUBCUTANEOUS | Status: DC
  Administered 2019-04-04: 4 [IU] via SUBCUTANEOUS

## 2019-04-04 MED ORDER — INSULIN ASPART 100 UNIT/ML ~~LOC~~ SOLN
0.0000 [IU] | Freq: Three times a day (TID) | SUBCUTANEOUS | Status: DC
  Administered 2019-04-04: 3 [IU] via SUBCUTANEOUS
  Administered 2019-04-04 – 2019-04-05 (×2): 4 [IU] via SUBCUTANEOUS
  Administered 2019-04-05: 3 [IU] via SUBCUTANEOUS
  Administered 2019-04-06: 4 [IU] via SUBCUTANEOUS
  Administered 2019-04-06 (×2): 3 [IU] via SUBCUTANEOUS
  Administered 2019-04-07: 11 [IU] via SUBCUTANEOUS
  Administered 2019-04-07: 3 [IU] via SUBCUTANEOUS

## 2019-04-04 NOTE — Plan of Care (Signed)
  Problem: Coping: Goal: Psychosocial and spiritual needs will be supported Outcome: Progressing   Problem: Respiratory: Goal: Complications related to the disease process, condition or treatment will be avoided or minimized Outcome: Progressing

## 2019-04-04 NOTE — Consult Note (Signed)
   [facility] Inpatient Consult   04/04/2019  Casi Westerfeld 1945/08/04 [id number]    THN: ACTIVE status:   Patient is currently active with [facility]) Care Management services. She is engaged Agricultural engineer (NP) for follow up on diabetes management.  Patientwas checked for16% riskscoreof unplanned readmission and  hospitalizations under her Medicare/ [employer] insuranceplan.  Review of medical record shows on MDbrief narrativeas: Khamya Topp a 74 y.o.femalewith medical history significant ofCVA withresidualleft-sided weakness,IDDM, hypertension and hyperlipidemia,   Admitted due to COVID-19 virus infection, Acute respiratory failure with hypoxia, stabilized with 2 L oxygen.    Her Primary Care ProviderisThomas Evalina Field, MDwith, [place] [facility], listed to provide transition of carefollow-up.  Patient will receive a post hospital call and will be evaluated for assessments and disease process education if transitioning to home.  OT evaluation note completed and currently recommending patient to SNF (skilled nursing facility). Patient niece lives with her and family helps.  Plan: Will followalong forprogressand disposition recommendations with inpatient transition of care team.  If transitions to SNF,no [place] needed, as patient'scare will be met at the skilled level of care.  WillupdateTriad IT sales professional (NP)of discharge disposition.    For questions oradditional information,please contact:  Vinson Tietze A. Harman Ferrin, BSN, RN-BC [facility] Liaison Cell: [phone]

## 2019-04-04 NOTE — Plan of Care (Signed)
  Problem: Education: Goal: Knowledge of risk factors and measures for prevention of condition will improve Outcome: Progressing   Problem: Coping: Goal: Psychosocial and spiritual needs will be supported Outcome: Progressing   Problem: Respiratory: Goal: Will maintain a patent airway Outcome: Progressing Goal: Complications related to the disease process, condition or treatment will be avoided or minimized Outcome: Progressing   

## 2019-04-04 NOTE — Progress Notes (Signed)
   04/04/19 1620  Family/Significant Other Communication  Family/Significant Other Update Called;Updated [name] / [phone])   Patient's daughter called and updated.

## 2019-04-04 NOTE — Progress Notes (Addendum)
Occupational Therapy Evaluation Patient Details Name: Michelle Lopez MRN: [id number] DOB: [birth year] Today's Date: 04/04/2019    History of Present Illness 74 y.o. female with medical history significant of CVA with residual left-sided weakness, IDDM, hypertension and hyperlipidemia presented with dry cough, pleuritic chest pain, and increasing short of breath for 3 days.    Her symptoms started 3 days ago, with generalized weakness and aching.  Gradually she developed short of breath and dry cough.    Clinical Impression   Patient has history of CVA and L side weakness.  She has flat affect and was lethargic during session, but did give good effort.  She was unable to give a clear history of prior level, but she states that niece lives with her and family helps.  She typically uses a wheelchair and requires assist with transfers.  Patient has expressive diffculty, and L UE roughly 2+/5 strength, no sensation impairment.  Patient able to initiate supine to EOB but required mod assist with bring trunk up, and min assist to support left side while in seated.  Required max assist to transfer to chair and needed verbal and tactile cueing.  Has difficulty moving L leg.  Once seated she stated that is usually the amount of assistance she requires with transfers but she does feel more tired from it, indicating room for improvement with strength and activity tolerance.   Patient remained on room air throughout session and SpO2> 92.  Will continue to follow with OT acutely to address the deficits listed below.       Follow Up Recommendations  SNF    Equipment Recommendations  None recommended by OT    Recommendations for Other Services       Precautions / Restrictions Precautions Precautions: Fall Precaution Comments: L side hemiparesis Restrictions Weight Bearing Restrictions: No      Mobility Bed Mobility Overal bed mobility: Needs Assistance Bed Mobility: Supine to Sit     Supine to sit:  Mod assist        Transfers Overall transfer level: Needs assistance   Transfers: Sit to/from Stand;Stand Pivot Transfers Sit to Stand: Mod assist Stand pivot transfers: Max assist;+2 physical assistance       General transfer comment: Has difficulty moving L leg and requires verbal/tactile cues    Balance Overall balance assessment: Needs assistance Sitting-balance support: Bilateral upper extremity supported Sitting balance-Leahy Scale: Poor Sitting balance - Comments: Needed min therapist support Postural control: Left lateral lean Standing balance support: Bilateral upper extremity supported;During functional activity Standing balance-Leahy Scale: Poor                             ADL either performed or assessed with clinical judgement   ADL Overall ADL's : Needs assistance/impaired Eating/Feeding: Set up;Minimal assistance;Sitting(R hand to mouth moves very slowly, difficulty reaching mouth)   Grooming: Minimal assistance;Sitting   Upper Body Bathing: Moderate assistance;Sitting   Lower Body Bathing: Maximal assistance;Sitting/lateral leans   Upper Body Dressing : Moderate assistance;Sitting   Lower Body Dressing: Maximal assistance;Sitting/lateral leans   Toilet Transfer: Maximal assistance;+2 for physical assistance   Toileting- Clothing Manipulation and Hygiene: Maximal assistance;Sitting/lateral lean       Functional mobility during ADLs: Maximal assistance;+2 for physical assistance       Vision         Perception     Praxis      Pertinent Vitals/Pain Pain Assessment: No/denies pain     Hand  Dominance Right   Extremity/Trunk Assessment Upper Extremity Assessment Upper Extremity Assessment: Generalized weakness;LUE deficits/detail;RUE deficits/detail RUE Deficits / Details: Overall 3+/5 strength, moves very slowly with decreased coordination.   LUE Deficits / Details: Hemiparesis: Shouler flx- 3/5, shoulder ex - 2/5, elbow flx  - 3/5, elbow ex - 2/5 LUE Sensation: WNL LUE Coordination: decreased fine motor;decreased gross motor   Lower Extremity Assessment Lower Extremity Assessment: Generalized weakness;LLE deficits/detail       Communication Communication Communication: Expressive difficulties   Cognition Arousal/Alertness: Lethargic Behavior During Therapy: Flat affect Overall Cognitive Status: No family/caregiver present to determine baseline cognitive functioning                                 General Comments: Unable to give clear history.  Slow to process and requires tactile cues   General Comments  Patient had fever during session, RN aware    Exercises     Shoulder Instructions      Home Living Family/patient expects to be discharged to:: Private residence Living Arrangements: Other relatives Available Help at Discharge: Family Type of Home: House Home Access: Level entry     Home Layout: One level     Bathroom Shower/Tub: Tub/shower unit         Home Equipment: Environmental consultant - 2 wheels;Shower seat;Wheelchair - manual   Additional Comments: Patient first stated she lives alone, then said niece lives with her. Also said other family comes in and helps a lot.      Prior Functioning/Environment Level of Independence: Needs assistance  Gait / Transfers Assistance Needed: Mostly uses wheelchair, requires assistance with transfers ADL's / Homemaking Assistance Needed: Assist from family Communication / Swallowing Assistance Needed: Expressive difficulty          OT Problem List: Decreased strength;Decreased range of motion;Decreased activity tolerance;Impaired balance (sitting and/or standing);Decreased coordination;Decreased safety awareness;Decreased cognition;Cardiopulmonary status limiting activity;Impaired UE functional use      OT Treatment/Interventions: Self-care/ADL training;Therapeutic exercise;Energy conservation;Therapeutic activities;Neuromuscular  education;Cognitive remediation/compensation;Patient/family education;Balance training    OT Goals(Current goals can be found in the care plan section) Acute Rehab OT Goals Patient Stated Goal: To go home OT Goal Formulation: With patient Time For Goal Achievement: 04/18/19 Potential to Achieve Goals: Good ADL Goals Pt Will Perform Eating: with set-up;sitting Pt Will Perform Grooming: with set-up;sitting Pt Will Perform Upper Body Bathing: with set-up;sitting Pt Will Transfer to Toilet: with mod assist;bedside commode;stand pivot transfer Pt/caregiver will Perform Home Exercise Program: Increased strength;Increased ROM;Both right and left upper extremity;With theraband;With Supervision  OT Frequency: Min 2X/week   Barriers to D/C:            Co-evaluation              AM-PAC OT "6 Clicks" Daily Activity     Outcome Measure Help from another person eating meals?: A Little Help from another person taking care of personal grooming?: A Little Help from another person toileting, which includes using toliet, bedpan, or urinal?: A Lot Help from another person bathing (including washing, rinsing, drying)?: A Lot Help from another person to put on and taking off regular upper body clothing?: A Lot Help from another person to put on and taking off regular lower body clothing?: A Lot 6 Click Score: 14   End of Session Equipment Utilized During Treatment: Gait belt Nurse Communication: Mobility status  Activity Tolerance: Patient limited by lethargy Patient left: in chair;with call bell/phone within reach;with  chair alarm set  OT Visit Diagnosis: Unsteadiness on feet (R26.81);Other abnormalities of gait and mobility (R26.89);Muscle weakness (generalized) (M62.81);Feeding difficulties (R63.3);Other symptoms and signs involving the nervous system (R29.898);Hemiplegia and hemiparesis Hemiplegia - Right/Left: Left Hemiplegia - dominant/non-dominant: Non-Dominant                Time:  [id number] OT Time Calculation (min): 39 min Charges:  OT General Charges $OT Visit: 1 Visit OT Evaluation $OT Eval Moderate Complexity: 1 Mod OT Treatments $Self Care/Home Management : 8-22 mins $Therapeutic Activity: 8-22 mins  August Luz, OTR/L  Phylliss Bob 04/04/2019, 3:47 PM

## 2019-04-04 NOTE — Progress Notes (Addendum)
PROGRESS NOTE    Michelle Lopez  LGX:[id number] DOB: 12-22-1945 DOA: 04/03/2019 PCP: Janith Lima, MD   Brief Narrative:  Michelle Lopez is Michelle Lopez 74 y.o. female with medical history significant of CVA with residual left-sided weakness, IDDM, hypertension and hyperlipidemia presented with dry cough, pleuritic chest pain, and increasing short of breath for 3 days.   Her symptoms started 3 days ago, with generalized weakness and aching.  Gradually she developed short of breath and dry cough.  Denies loss of taste, nausea vomiting or diarrhea, denies sore throat runny nose, no fever but episodes of chills.  She lives with her daughter, and she denied any known COVID-19 contact. ED Course: COVID-19 positive, tachypneic, borderline hypoxia, stabilized with 2 L oxygen.  Assessment & Plan:   Active Problems:   COVID-19 virus infection   COVID-19   Acute respiratory failure with hypoxia ([place])  COVID-19 infection with impending respiratory failure CXR 2/26 without acute disease On 2 L overnight, but now back on RA Steroids/remdesivir started on admission in setting of her tachypnea (consider d/c steroids if stable on RA) I/O, daily weights IS, OOB, prone as able Follow LE [place]  COVID-19 Labs  Recent Labs    04/03/19 1828 04/04/19 0342  DDIMER 1.55* 1.79*  FERRITIN  --  90  CRP  --  3.9*    No results found for: SARSCOV2NAA  Fever: likely 2/2 above, follow cultures  AKI Baseline 12/2018 was ~1.11 Improving  Follow with IVF Follow UA (negative protein, 0-5 RBC), renal US (pending).  CK mildly elevated.   suspect dehydration from poor oral intake Hold thiazide and ARB  Hyperkalemia: likely spurious, improved on repeat  T2DM SSI + lantus, follow.  Add mealtime.  Hypertension Controlled, continue home meds (amlodipine and metoprolol).  Hold thiazide and arb.  History of CVA Left-sided weakness chronic Aspirin, plavix and statin  DVT prophylaxis: heparin Code Status: full    Family Communication: none at bedside - discussed with daughter Michelle Lopez Disposition Plan:  . Patient came from: home            . Anticipated d/c place: home . Barriers to d/c OR conditions which need to be met to effect Michelle Lopez safe d/c: pending improvement in renal function, therapy eval  Consultants:   none  Procedures:  none  Antimicrobials:  Anti-infectives (From admission, onward)   Start     Dose/Rate Route Frequency Ordered Stop   04/04/19 1000  remdesivir 100 mg in sodium chloride 0.9 % 100 mL IVPB     100 mg 200 mL/hr over 30 Minutes Intravenous Daily 04/03/19 1252 04/08/19 0959   04/03/19 1400  remdesivir 200 mg in sodium chloride 0.9% 250 mL IVPB     200 mg 580 mL/hr over 30 Minutes Intravenous Once 04/03/19 1252 04/03/19 1900     Subjective: Feels about the same as when she came in [place]&Ox~2-3   Objective: Vitals:   04/03/19 2300 04/04/19 0300 04/04/19 0332 04/04/19 0400  BP:  135/66    Pulse:  97  97  Resp:  (!) 33  (!) 30  Temp: 99.4 F (37.4 C)  100 F (37.8 C)   TempSrc: Oral  Oral   SpO2:  96%  96%  Weight:      Height:        Intake/Output Summary (Last 24 hours) at 04/04/2019 1106 Last data filed at 04/04/2019 0400 Gross per 24 hour  Intake 1530 ml  Output 1250 ml  Net 280 ml  Filed Weights   04/03/19 1147  Weight: 70.3 kg    Examination:  General exam: Appears calm and comfortable  Respiratory system: Clear to auscultation. Respiratory effort normal. Cardiovascular system: S1 & S2 heard, RRR Gastrointestinal system: Abdomen is nondistended, soft and nontender.  Central nervous system: Alert and oriented x 2-3. No focal neurological deficits. Extremities: no LEE Skin: No rashes, lesions or ulcers Psychiatry: Judgement and insight appear normal. Mood & affect appropriate.     Data Reviewed: I have personally reviewed following labs and imaging studies  CBC: Recent Labs  Lab 04/03/19 1048 04/04/19 0342  WBC 5.1 3.2*  NEUTROABS 3.2  2.4  HGB 14.4 13.2  HCT 47.3* 42.9  MCV 84.6 84.6  PLT 162 349   Basic Metabolic Panel: Recent Labs  Lab 04/03/19 1048 04/04/19 0342 04/04/19 0821  NA 140 136  --   K 4.0 6.1* 4.1  CL 106 104  --   CO2 22 22  --   GLUCOSE 159* 258*  --   BUN 49* 35*  --   CREATININE 2.15* 1.51*  --   CALCIUM 9.1 8.3*  --   MG  --  1.7  --   PHOS  --  2.1*  --    GFR: Estimated Creatinine Clearance: 29.8 mL/min (Francis Yardley) (by C-G formula based on SCr of 1.51 mg/dL (H)). Liver Function Tests: Recent Labs  Lab 04/03/19 1048 04/04/19 0342  AST 36 50*  ALT 20 21  ALKPHOS 61 45  BILITOT 0.8 1.4*  PROT 8.1 7.0  ALBUMIN 3.7 3.2*   No results for input(s): LIPASE, AMYLASE in the last 168 hours. No results for input(s): AMMONIA in the last 168 hours. Coagulation Profile: Recent Labs  Lab 04/03/19 1048  INR 0.9   Cardiac Enzymes: Recent Labs  Lab 04/03/19 1048  CKTOTAL 327*   BNP (last 3 results) No results for input(s): PROBNP in the last 8760 hours. HbA1C: Recent Labs    04/03/19 1048  HGBA1C 7.6*   CBG: Recent Labs  Lab 04/03/19 1651 04/03/19 2038 04/04/19 0818  GLUCAP 170* 231* 195*   Lipid Profile: No results for input(s): CHOL, HDL, LDLCALC, TRIG, CHOLHDL, LDLDIRECT in the last 72 hours. Thyroid Function Tests: No results for input(s): TSH, T4TOTAL, FREET4, T3FREE, THYROIDAB in the last 72 hours. Anemia Panel: Recent Labs    04/04/19 0342  FERRITIN 90   Sepsis Labs: Recent Labs  Lab 04/03/19 1048 04/03/19 1828 04/04/19 0342  LATICACIDVEN 1.2 2.9* 2.0*    Recent Results (from the past 240 hour(s))  Blood Culture (routine x 2)     Status: None (Preliminary result)   Collection Time: 04/03/19 12:15 PM   Specimen: BLOOD  Result Value Ref Range Status   Specimen Description BLOOD SITE NOT SPECIFIED  Final   Special Requests   Final    BOTTLES DRAWN AEROBIC AND ANAEROBIC Blood Culture results may not be optimal due to an inadequate volume of blood received in  culture bottles Performed at [facility] [address] [place], [place] [address]    Culture NO GROWTH < 12 HOURS  Final   Report Status PENDING  Incomplete  Blood Culture (routine x 2)     Status: None (Preliminary result)   Collection Time: 04/03/19 12:37 PM   Specimen: BLOOD  Result Value Ref Range Status   Specimen Description BLOOD LEFT ANTECUBITAL  Final   Special Requests   Final    BOTTLES DRAWN AEROBIC ONLY Blood Culture results  may not be optimal due to an inadequate volume of blood received in culture bottles Performed at [facility], [place] [address] [place], [place] [address]    Culture NO GROWTH < 12 HOURS  Final   Report Status PENDING  Incomplete         Radiology Studies: DG CHEST PORT 1 VIEW  Result Date: 04/04/2019 CLINICAL DATA:  Shortness of breath. EXAM: PORTABLE CHEST 1 VIEW COMPARISON:  Twenty-fifth 2021. FINDINGS: Stable cardiomediastinal silhouette. Atherosclerosis of thoracic aorta is noted. No pneumothorax or pleural effusion is noted. No pneumothorax or pleural effusion is noted. IMPRESSION: No active disease. Aortic Atherosclerosis (ICD10-I70.0). Electronically Signed   By: Marijo Conception M.D.   On: 04/04/2019 08:48   DG Chest Port 1 View  Result Date: 04/03/2019 CLINICAL DATA:  Body aches, and sinus congestion and cough for 3 days. EXAM: PORTABLE CHEST 1 VIEW COMPARISON:  PA and lateral chest 02/25/2015. FINDINGS: The lung volumes are low. Lungs clear. No pneumothorax or pleural effusion. Heart size is normal. Atherosclerosis. No acute or focal bony abnormality. IMPRESSION: No acute disease. Atherosclerosis. Electronically Signed   By: Inge Rise M.D.   On: 04/03/2019 11:11        Scheduled Meds: . amLODipine  5 mg Oral Daily  . vitamin C  500 mg Oral Daily  . aspirin  81 mg Oral Daily  . atorvastatin  20 mg Oral Daily  . clopidogrel  75 mg Oral Daily  . dexamethasone  6 mg Oral Daily  . docusate sodium  100 mg Oral BID    . feeding supplement (GLUCERNA SHAKE)  237 mL Oral TID BM  . heparin  5,000 Units Subcutaneous Q12H  . insulin aspart  0-20 Units Subcutaneous TID WC  . insulin aspart  0-5 Units Subcutaneous QHS  . insulin glargine  36 Units Subcutaneous Daily  . ipratropium  2 puff Inhalation Q6H  . loratadine  10 mg Oral Daily  . metoprolol tartrate  12.5 mg Oral BID  . tamsulosin  0.4 mg Oral Daily  . Vitamin D (Ergocalciferol)  50,000 Units Oral Q7 days  . zinc sulfate  220 mg Oral Daily   Continuous Infusions: . lactated ringers 100 mL/hr at 04/04/19 0149  . remdesivir 100 mg in NS 100 mL       LOS: 1 day    Time spent: over 30 min     Fayrene Helper, MD [facility]   To contact the attending provider between 7A-7P or the covering provider during after hours 7P-7A, please log into the web site [email or web address] and access using universal  password for that web site. If you do not have the password, please call the hospital operator.  04/04/2019, 11:06 AM

## 2019-04-05 ENCOUNTER — Inpatient Hospital Stay (HOSPITAL_COMMUNITY): Payer: Medicare Other

## 2019-04-05 DIAGNOSIS — R7989 Other specified abnormal findings of blood chemistry: Secondary | ICD-10-CM

## 2019-04-05 LAB — CBC WITH DIFFERENTIAL/PLATELET
Abs Immature Granulocytes: 0.01 10*3/uL (ref 0.00–0.07)
Basophils Absolute: 0 10*3/uL (ref 0.0–0.1)
Basophils Relative: 0 %
Eosinophils Absolute: 0 10*3/uL (ref 0.0–0.5)
Eosinophils Relative: 0 %
HCT: 41.7 % (ref 36.0–46.0)
Hemoglobin: 13.2 g/dL (ref 12.0–15.0)
Immature Granulocytes: 0 %
Lymphocytes Relative: 41 %
Lymphs Abs: 1.7 10*3/uL (ref 0.7–4.0)
MCH: 25.9 pg — ABNORMAL LOW (ref 26.0–34.0)
MCHC: 31.7 g/dL (ref 30.0–36.0)
MCV: 81.9 fL (ref 80.0–100.0)
Monocytes Absolute: 0.6 10*3/uL (ref 0.1–1.0)
Monocytes Relative: 14 %
Neutro Abs: 1.8 10*3/uL (ref 1.7–7.7)
Neutrophils Relative %: 45 %
Platelets: 154 10*3/uL (ref 150–400)
RBC: 5.09 MIL/uL (ref 3.87–5.11)
RDW: 13 % (ref 11.5–15.5)
WBC: 4.1 10*3/uL (ref 4.0–10.5)
nRBC: 0 % (ref 0.0–0.2)

## 2019-04-05 LAB — MAGNESIUM: Magnesium: 1.6 mg/dL — ABNORMAL LOW (ref 1.7–2.4)

## 2019-04-05 LAB — COMPREHENSIVE METABOLIC PANEL
ALT: 15 U/L (ref 0–44)
AST: 28 U/L (ref 15–41)
Albumin: 3.1 g/dL — ABNORMAL LOW (ref 3.5–5.0)
Alkaline Phosphatase: 48 U/L (ref 38–126)
Anion gap: 10 (ref 5–15)
BUN: 39 mg/dL — ABNORMAL HIGH (ref 8–23)
CO2: 24 mmol/L (ref 22–32)
Calcium: 8.4 mg/dL — ABNORMAL LOW (ref 8.9–10.3)
Chloride: 105 mmol/L (ref 98–111)
Creatinine, Ser: 1.29 mg/dL — ABNORMAL HIGH (ref 0.44–1.00)
GFR calc Af Amer: 47 mL/min — ABNORMAL LOW (ref >60)
GFR calc non Af Amer: 40 mL/min — ABNORMAL LOW (ref >60)
Glucose, Bld: 137 mg/dL — ABNORMAL HIGH (ref 70–99)
Potassium: 4.1 mmol/L (ref 3.5–5.1)
Sodium: 139 mmol/L (ref 135–145)
Total Bilirubin: 0.4 mg/dL (ref 0.3–1.2)
Total Protein: 6.9 g/dL (ref 6.5–8.1)

## 2019-04-05 LAB — GLUCOSE, CAPILLARY
Glucose-Capillary: 112 mg/dL — ABNORMAL HIGH (ref 70–99)
Glucose-Capillary: 144 mg/dL — ABNORMAL HIGH (ref 70–99)
Glucose-Capillary: 189 mg/dL — ABNORMAL HIGH (ref 70–99)
Glucose-Capillary: 173 mg/dL — ABNORMAL HIGH (ref 70–99)

## 2019-04-05 LAB — D-DIMER, QUANTITATIVE: D-Dimer, Quant: 1.57 ug/mL-FEU — ABNORMAL HIGH (ref 0.00–0.50)

## 2019-04-05 LAB — C-REACTIVE PROTEIN: CRP: 3.7 mg/dL — ABNORMAL HIGH (ref <1.0)

## 2019-04-05 LAB — PHOSPHORUS: Phosphorus: 2.7 mg/dL (ref 2.5–4.6)

## 2019-04-05 LAB — FERRITIN: Ferritin: 157 ng/mL (ref 11–307)

## 2019-04-05 MED ORDER — DEXAMETHASONE 6 MG PO TABS
6.0000 mg | ORAL_TABLET | Freq: Every day | ORAL | Status: DC
  Administered 2019-04-05 – 2019-04-06 (×2): 6 mg via ORAL
  Filled 2019-04-05 (×2): qty 1

## 2019-04-05 MED ORDER — INSULIN GLARGINE 100 UNIT/ML ~~LOC~~ SOLN
28.0000 [IU] | Freq: Every day | SUBCUTANEOUS | Status: DC
  Administered 2019-04-05 – 2019-04-07 (×3): 28 [IU] via SUBCUTANEOUS
  Filled 2019-04-05 (×3): qty 0.28

## 2019-04-05 NOTE — Progress Notes (Signed)
   04/04/19 1245  Vitals  Temp (!) 102.5 F (39.2 C)  Temp Source Oral  Pulse Rate (!) 108  Pulse Rate Source Monitor  ECG Heart Rate (!) 108  Resp (!) 28  Level of Consciousness  Level of Consciousness Alert  Oxygen Therapy  SpO2 91 %  O2 Device Room Air  MEWS Score  MEWS Temp 2  MEWS Systolic 0  MEWS Pulse 1  MEWS RR 2  MEWS LOC 0  MEWS Score 5  MEWS Score Color Red  MEWS Assessment  Is this an acute change? Yes  Provider Notification  Provider Name/Title Dr. Florene Glen  Date Provider Notified 04/04/19  Time Provider Notified 1246  Notification Type Page  Notification Reason Other (Comment) (Oral temp 102.5, Red MEWS)  Response No new orders (give tylenol, monitor, reasses temp)  Date of Provider Response 04/04/19  Time of Provider Response 1247  Rapid Response Notification  Name of Rapid Response RN Notified Hannah CN  Date Rapid Response Notified 04/04/19  Time Rapid Response Notified 1245

## 2019-04-05 NOTE — Progress Notes (Signed)
This RN spoke with patients daughter [name].  Daughter wanted to confirm patients phone was in her jacket pocket, and it was. This RN gave patient her phone and [name] says she will be bringing up the charger for that phone sometime today.  All questions answered and [name] was given my iphone number for the day in case she wanted to call me direct.

## 2019-04-05 NOTE — Plan of Care (Signed)
  Problem: Education: Goal: Knowledge of risk factors and measures for prevention of condition will improve Outcome: Progressing   Problem: Coping: Goal: Psychosocial and spiritual needs will be supported Outcome: Progressing   Problem: Respiratory: Goal: Will maintain a patent airway Outcome: Progressing Goal: Complications related to the disease process, condition or treatment will be avoided or minimized Outcome: Progressing   

## 2019-04-05 NOTE — TOC Initial Note (Signed)
Transition of Care [facility]) - Initial/Assessment Note    Patient Details  Name: Michelle Lopez MRN: [id number] Date of Birth: 1946/01/27  [facility] CM/SW Contact:    Ninfa Meeker, RN Phone Number: [phone] (working remotely) 04/05/2019, 9:58 AM  Clinical Narrative:  Patient  is a 74 y.o.femalewith medical history significant ofCVA withresidualleft-sided weakness,IDDM, hypertension and hyperlipidemia,   Admitted due to COVID-19 virus infection,Acute respiratory failure with hypoxia. On 2L , Remdesivir until 04/08/19. TOC Team will continue to follow for needs.                   Barriers to Discharge: Continued Medical Work up   Patient Goals and CMS Choice        Expected Discharge Plan and Services                                                Prior Living Arrangements/Services                       Activities of Daily Living Home Assistive Devices/Equipment: CBG Meter, Blood pressure cuff, Eyeglasses, Hearing aid, Walker (specify type), Wheelchair ADL Screening (condition at time of admission) Patient's cognitive ability adequate to safely complete daily activities?: Yes Is the patient deaf or have difficulty hearing?: Yes Does the patient have difficulty seeing, even when wearing glasses/contacts?: No Does the patient have difficulty concentrating, remembering, or making decisions?: No Patient able to express need for assistance with ADLs?: Yes Does the patient have difficulty dressing or bathing?: Yes Independently performs ADLs?: No Communication: Independent Dressing (OT): Needs assistance Is this a change from baseline?: Change from baseline, expected to last <3days Grooming: Needs assistance Is this a change from baseline?: Change from baseline, expected to last <3 days Feeding: Independent Bathing: Needs assistance Is this a change from baseline?: Change from baseline, expected to last <3 days Toileting: Needs  assistance Is this a change from baseline?: Change from baseline, expected to last <3 days In/Out Bed: Needs assistance Is this a change from baseline?: Change from baseline, expected to last <3 days Walks in Home: Needs assistance Is this a change from baseline?: Change from baseline, expected to last <3 days Does the patient have difficulty walking or climbing stairs?: Yes Weakness of Legs: Both Weakness of Arms/Hands: Both  Permission Sought/Granted                  Emotional Assessment              Admission diagnosis:  Acute respiratory failure with hypoxia ([place]) [J96.01] AKI (acute kidney injury) ([place]) [N17.9] COVID-19 [U07.1] Patient Active Problem List   Diagnosis Date Noted  . COVID-19 virus infection 04/03/2019  . COVID-19 04/03/2019  . Acute respiratory failure with hypoxia ([place])   . Tinea corporis 12/17/2018  . CRI (chronic renal insufficiency), stage 4 (severe) ([place]) 02/15/2018  . Protein-calorie malnutrition, mild ([place]) 10/17/2017  . B12 deficiency 08/23/2017  . Primary osteoarthritis of right hip 04/03/2017  . PAD (peripheral artery disease) ([place]) 07/24/2016  . Vitamin D deficiency 07/06/2015  . Osteopenia, senile 03/03/2014  . 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 Seborrheic dermatitis 03/05/2013  . Dysphagia as late effect of stroke 05/30/2011  . Dementia arising in the senium and presenium ([place]) 05/02/2011  . Routine general medical examination  at a health care facility 05/02/2011  . Hyperlipidemia with target LDL less than 70   . Visit for screening mammogram 09/15/2010  . DEPRESSION 06/24/2009  . VERTIGO, CENTRAL 01/14/2009  . Insomnia 01/14/2009  . CVA (cerebral vascular accident) ([place]) 06/11/2008  . DM (diabetes mellitus), type 2 with renal complications ([place]) [id number]  . Essential hypertension, benign 06/05/2007   PCP:  Janith Lima, MD Pharmacy:   [facility] [place], [place] - [address]  [address] [place] [address] Phone: [phone] Fax: [phone]     Social Determinants of Health (SDOH) Interventions    Readmission Risk Interventions No flowsheet data found.

## 2019-04-05 NOTE — Progress Notes (Signed)
PROGRESS NOTE    Michelle Lopez  JOA:[id number] DOB: 1945-06-11 DOA: 04/03/2019 PCP: Janith Lima, MD   Brief Narrative:  Michelle Lopez is Michelle Lopez 74 y.o. female with medical history significant of CVA with residual left-sided weakness, IDDM, hypertension and hyperlipidemia presented with dry cough, pleuritic chest pain, and increasing short of breath for 3 days.   Her symptoms started 3 days ago, with generalized weakness and aching.  Gradually she developed short of breath and dry cough.  Denies loss of taste, nausea vomiting or diarrhea, denies sore throat runny nose, no fever but episodes of chills.  She lives with her daughter, and she denied any known COVID-19 contact. ED Course: COVID-19 positive, tachypneic, borderline hypoxia, stabilized with 2 L oxygen.  Assessment & Plan:   Active Problems:   COVID-19 virus infection   COVID-19   Acute respiratory failure with hypoxia ([facility])  COVID-19 infection with impending respiratory failure CXR 2/26 without acute disease On RA, O2 sats on lower side Steroids/remdesivir started on admission in setting of her tachypnea (consider d/c steroids if stable on RA) I/O, daily weights IS, OOB, prone as able Follow LE [place] (negative for DVT)  COVID-19 Labs  Recent Labs    04/03/19 1828 04/04/19 0342 04/05/19 0401  DDIMER 1.55* 1.79* 1.57*  FERRITIN  --  90 157  CRP  --  3.9* 3.7*    No results found for: SARSCOV2NAA  Fever: likely 2/2 above, follow cultures (urine cx with multiple species)  AKI Baseline 12/2018 was ~1.11 Improving  Follow with IVF Renal US with mildly enlarged kidneys suggesting ATN vs interstitial nephritis (UA unimpressive on presentation - suspect ATN from hypovolemia, poor intake more likely) Follow UA (negative protein, 0-5 RBC), renal US (pending).  CK mildly elevated.   suspect dehydration from poor oral intake Hold thiazide and ARB  Hyperkalemia: resolved  T2DM SSI + lantus - adjusting due to poor PO  intake, follow  Hypertension Controlled, continue home meds (amlodipine and metoprolol).  Hold thiazide and arb.  History of CVA Left-sided weakness chronic Aspirin, plavix and statin  DVT prophylaxis: heparin Code Status: full  Family Communication: none at bedside - discussed with daughter marion Disposition Plan:  . Patient came from: home            . Anticipated d/c place: SNF vs home . Barriers to d/c OR conditions which need to be met to effect Michelle Lopez safe d/c: pending improvement in renal function, therapy eval  Consultants:   none  Procedures:  none  Antimicrobials:  Anti-infectives (From admission, onward)   Start     Dose/Rate Route Frequency Ordered Stop   04/04/19 1000  remdesivir 100 mg in sodium chloride 0.9 % 100 mL IVPB     100 mg 200 mL/hr over 30 Minutes Intravenous Daily 04/03/19 1252 04/08/19 0959   04/03/19 1400  remdesivir 200 mg in sodium chloride 0.9% 250 mL IVPB     200 mg 580 mL/hr over 30 Minutes Intravenous Once 04/03/19 1252 04/03/19 1900     Subjective: Michelle Lopez&Ox ~2 Feeling better.   Objective: Vitals:   04/04/19 2332 04/05/19 0400 04/05/19 0600 04/05/19 0700  BP: (!) 102/44 121/63 (!) 119/52   Pulse:  88 75 81  Resp:  (!) 22 (!) 25 (!) 22  Temp:  97.9 F (36.6 C) 99.1 F (37.3 C)   TempSrc:   Oral   SpO2:  92% (!) 89% (!) 87%  Weight:      Height:  Intake/Output Summary (Last 24 hours) at 04/05/2019 1144 Last data filed at 04/04/2019 1650 Gross per 24 hour  Intake 1430.6 ml  Output -  Net 1430.6 ml   Filed Weights   04/03/19 1147  Weight: 70.3 kg    Examination:  General: No acute distress. Cardiovascular: Heart sounds show Michelle Lopez regular rate, and rhythm.  Lungs: Clear to auscultation bilaterally  Abdomen: Soft, nontender, nondistended  Neurological: Alert and oriented 2. Moves all extremities 4. Cranial nerves II through XII grossly intact. Skin: Warm and dry. No rashes or lesions. Extremities: No clubbing or  cyanosis. No edema.   Data Reviewed: I have personally reviewed following labs and imaging studies  CBC: Recent Labs  Lab 04/03/19 1048 04/04/19 0342 04/05/19 0401  WBC 5.1 3.2* 4.1  NEUTROABS 3.2 2.4 1.8  HGB 14.4 13.2 13.2  HCT 47.3* 42.9 41.7  MCV 84.6 84.6 81.9  PLT 162 151 659   Basic Metabolic Panel: Recent Labs  Lab 04/03/19 1048 04/04/19 0342 04/04/19 0821 04/05/19 0401  NA 140 136  --  139  K 4.0 6.1* 4.1 4.1  CL 106 104  --  105  CO2 22 22  --  24  GLUCOSE 159* 258*  --  137*  BUN 49* 35*  --  39*  CREATININE 2.15* 1.51*  --  1.29*  CALCIUM 9.1 8.3*  --  8.4*  MG  --  1.7  --  1.6*  PHOS  --  2.1*  --  2.7   GFR: Estimated Creatinine Clearance: 34.9 mL/min (Michelle Lopez) (by C-G formula based on SCr of 1.29 mg/dL (H)). Liver Function Tests: Recent Labs  Lab 04/03/19 1048 04/04/19 0342 04/05/19 0401  AST 36 50* 28  ALT '20 21 15  ' ALKPHOS 61 45 48  BILITOT 0.8 1.4* 0.4  PROT 8.1 7.0 6.9  ALBUMIN 3.7 3.2* 3.1*   No results for input(s): LIPASE, AMYLASE in the last 168 hours. No results for input(s): AMMONIA in the last 168 hours. Coagulation Profile: Recent Labs  Lab 04/03/19 1048  INR 0.9   Cardiac Enzymes: Recent Labs  Lab 04/03/19 1048  CKTOTAL 327*   BNP (last 3 results) No results for input(s): PROBNP in the last 8760 hours. HbA1C: Recent Labs    04/03/19 1048  HGBA1C 7.6*   CBG: Recent Labs  Lab 04/03/19 2038 04/04/19 0818 04/04/19 1229 04/04/19 1654 04/04/19 2046  GLUCAP 231* 195* 133* 88 154*   Lipid Profile: No results for input(s): CHOL, HDL, LDLCALC, TRIG, CHOLHDL, LDLDIRECT in the last 72 hours. Thyroid Function Tests: No results for input(s): TSH, T4TOTAL, FREET4, T3FREE, THYROIDAB in the last 72 hours. Anemia Panel: Recent Labs    04/04/19 0342 04/05/19 0401  FERRITIN 90 157   Sepsis Labs: Recent Labs  Lab 04/03/19 1048 04/03/19 1828 04/04/19 0342  LATICACIDVEN 1.2 2.9* 2.0*    Recent Results (from the past  240 hour(s))  Blood Culture (routine x 2)     Status: None (Preliminary result)   Collection Time: 04/03/19 12:15 PM   Specimen: BLOOD  Result Value Ref Range Status   Specimen Description BLOOD SITE NOT SPECIFIED  Final   Special Requests   Final    BOTTLES DRAWN AEROBIC AND ANAEROBIC Blood Culture results may not be optimal due to an inadequate volume of blood received in culture bottles   Culture   Final    NO GROWTH 2 DAYS Performed at [facility], [place] [address], [place], [place] [address]  Report Status PENDING  Incomplete  Blood Culture (routine x 2)     Status: None (Preliminary result)   Collection Time: 04/03/19 12:37 PM   Specimen: BLOOD  Result Value Ref Range Status   Specimen Description BLOOD LEFT ANTECUBITAL  Final   Special Requests   Final    BOTTLES DRAWN AEROBIC ONLY Blood Culture results may not be optimal due to an inadequate volume of blood received in culture bottles   Culture   Final    NO GROWTH 2 DAYS Performed at [facility], [place] [address] [place], [place] [address]    Report Status PENDING  Incomplete  Urine culture     Status: Abnormal   Collection Time: 04/03/19  6:00 PM   Specimen: In/Out Cath Urine  Result Value Ref Range Status   Specimen Description   Final    IN/OUT CATH URINE Performed at [facility] [address] [place], [place] [address]    Special Requests   Final    NONE Performed at [facility], [place] [address] [place], [place] [address]    Culture MULTIPLE SPECIES PRESENT, SUGGEST RECOLLECTION (Linnet Bottari)  Final   Report Status 04/04/2019 FINAL  Final         Radiology Studies: US RENAL  Result Date: 04/04/2019 CLINICAL DATA:  Acute kidney injury. COVID-19 infection. Altered mental status. EXAM: RENAL / URINARY TRACT ULTRASOUND COMPLETE COMPARISON:  Renal ultrasound 06/19/2018 FINDINGS: Right Kidney: Renal measurements: 10.1 x 6.0 x 5.2 cm = volume: 164.5 mL. There is  cortical thinning with increased echogenicity. Zayed Griffie small cyst is present in the interpolar region, measuring 15 mm maximally. No hydronephrosis. Left Kidney: Renal measurements: 11.2 x 6.7 x 4.9 cm = volume: 194.7 mL. Mild cortical thinning. No hydronephrosis. Bladder: Appears normal for degree of bladder distention. Other: Examination is mildly limited by the patient's mental status and bowel gas. IMPRESSION: 1. Both kidneys are mildly enlarged compared with the prior ultrasound, suggesting acute tubular necrosis or interstitial nephritis. 2. No hydronephrosis or perinephric fluid collection. Electronically Signed   By: Richardean Sale M.D.   On: 04/04/2019 12:10   DG CHEST PORT 1 VIEW  Result Date: 04/04/2019 CLINICAL DATA:  Shortness of breath. EXAM: PORTABLE CHEST 1 VIEW COMPARISON:  Twenty-fifth 2021. FINDINGS: Stable cardiomediastinal silhouette. Atherosclerosis of thoracic aorta is noted. No pneumothorax or pleural effusion is noted. No pneumothorax or pleural effusion is noted. IMPRESSION: No active disease. Aortic Atherosclerosis (ICD10-I70.0). Electronically Signed   By: Marijo Conception M.D.   On: 04/04/2019 08:48   VAS [place] LOWER EXTREMITY VENOUS (DVT)  Result Date: 04/05/2019  Lower Venous DVTStudy Indications: Elevated ddimer.  Comparison Study: no prior Performing Technologist: Abram Sander RVS  Examination Guidelines: Brenae Lasecki complete evaluation includes B-mode imaging, spectral Doppler, color Doppler, and power Doppler as needed of all accessible portions of each vessel. Bilateral testing is considered an integral part of Shaquinta Peruski complete examination. Limited examinations for reoccurring indications may be performed as noted. The reflux portion of the exam is performed with the patient in reverse Trendelenburg.  +---------+---------------+---------+-----------+----------+--------------+ RIGHT    CompressibilityPhasicitySpontaneityPropertiesThrombus Aging  +---------+---------------+---------+-----------+----------+--------------+ CFV      Full           Yes      Yes                                 +---------+---------------+---------+-----------+----------+--------------+ SFJ  Full                                                        +---------+---------------+---------+-----------+----------+--------------+ FV Prox  Full                                                        +---------+---------------+---------+-----------+----------+--------------+ FV Mid   Full                                                        +---------+---------------+---------+-----------+----------+--------------+ FV DistalFull                                                        +---------+---------------+---------+-----------+----------+--------------+ PFV      Full                                                        +---------+---------------+---------+-----------+----------+--------------+ POP      Full           Yes      Yes                                 +---------+---------------+---------+-----------+----------+--------------+ PTV                                                   Not visualized +---------+---------------+---------+-----------+----------+--------------+ PERO                                                  Not visualized +---------+---------------+---------+-----------+----------+--------------+   +---------+---------------+---------+-----------+----------+--------------+ LEFT     CompressibilityPhasicitySpontaneityPropertiesThrombus Aging +---------+---------------+---------+-----------+----------+--------------+ CFV      Full           Yes      Yes                                 +---------+---------------+---------+-----------+----------+--------------+ SFJ      Full                                                         +---------+---------------+---------+-----------+----------+--------------+ FV Prox  Full                                                        +---------+---------------+---------+-----------+----------+--------------+  FV Mid   Full                                                        +---------+---------------+---------+-----------+----------+--------------+ FV DistalFull                                                        +---------+---------------+---------+-----------+----------+--------------+ PFV      Full                                                        +---------+---------------+---------+-----------+----------+--------------+ POP      Full           Yes      Yes                                 +---------+---------------+---------+-----------+----------+--------------+ PTV      Full                                                        +---------+---------------+---------+-----------+----------+--------------+ PERO     Full                                                        +---------+---------------+---------+-----------+----------+--------------+     Summary: BILATERAL: - No evidence of deep vein thrombosis seen in the lower extremities, bilaterally.   *See table(s) above for measurements and observations.    Preliminary         Scheduled Meds: . amLODipine  5 mg Oral Daily  . vitamin C  500 mg Oral Daily  . aspirin  81 mg Oral Daily  . atorvastatin  20 mg Oral Daily  . clopidogrel  75 mg Oral Daily  . docusate sodium  100 mg Oral BID  . feeding supplement (GLUCERNA SHAKE)  237 mL Oral TID BM  . heparin  5,000 Units Subcutaneous Q8H  . insulin aspart  0-20 Units Subcutaneous TID WC  . insulin aspart  0-5 Units Subcutaneous QHS  . insulin glargine  28 Units Subcutaneous Daily  . ipratropium  2 puff Inhalation Q6H  . loratadine  10 mg Oral Daily  . metoprolol tartrate  12.5 mg Oral BID  . tamsulosin  0.4 mg Oral Daily   . Vitamin D (Ergocalciferol)  50,000 Units Oral Q7 days  . zinc sulfate  220 mg Oral Daily   Continuous Infusions: . remdesivir 100 mg in NS 100 mL 100 mg (04/05/19 0922)     LOS: 2 days    Time spent: over 30 min     Fayrene Helper, MD [facility]   To  contact the attending provider between 7A-7P or the covering provider during after hours 7P-7A, please log into the web site [email or web address] and access using universal [place] password for that web site. If you do not have the password, please call the hospital operator.  04/05/2019, 11:44 AM

## 2019-04-05 NOTE — Evaluation (Signed)
Physical Therapy Evaluation Patient Details Name: Michelle Lopez MRN: [id number] DOB: 13-Apr-1945 Today's Date: 04/05/2019   History of Present Illness  74 y.o. female with medical history significant of CVA with residual left-sided weakness, IDDM, hypertension and hyperlipidemia presented with dry cough, pleuritic chest pain, and increasing short of breath for 3 days.    Her symptoms started 3 days ago, with generalized weakness and aching.  Gradually she developed short of breath and dry cough.   Clinical Impression   Pt admitted with above diagnosis. PTA states was living at home with daughter, and she did have a nurse come in and assist with ADLs, nurse was suspended when she refused to get COVID tested. She reports she mainly stayed in bed or in chair alternating as needed. She did need assist with transfers. She could get into bathtub to bathe and daughter would run the bath but needed assist from nurse to get out of tub. She also reports that her daughter was working and at times she would be home alone. Pt currently with functional limitations due to the deficits listed below (see PT Problem List). This morining pt did well with mobility, she needed mod a with most was able to get to edge of bet sit supported briefly, was able to stand and pivot to recliner (placed to her right) with mod assist and very good trunk control.  Pt will benefit from skilled PT to increase their independence and safety with mobility to allow discharge to the venue listed below.       Follow Up Recommendations Supervision/Assistance - 24 hour(HH vs SNF) Pt does not wish to go to SNF and reports has multi family members who can check in on her and assist. If she is able to get 24 hour assist she may be able to go home with home health care, if she is to be left home alone when daughter is not there then she would do better at a SNF.    Equipment Recommendations  None recommended by PT    Recommendations for Other  Services       Precautions / Restrictions Precautions Precautions: Fall Precaution Comments: L side hemiparesis/ weakness Restrictions Weight Bearing Restrictions: No      Mobility  Bed Mobility Overal bed mobility: Needs Assistance Bed Mobility: Supine to Sit     Supine to sit: Mod assist        Transfers Overall transfer level: Needs assistance Equipment used: 1 person hand held assist Transfers: Sit to/from [employer] Transfers Sit to Stand: Mod assist Stand pivot transfers: Mod assist       General transfer comment: needs to pivot to RIGHT side  Ambulation/Gait             General Gait Details: pt does not ambulate at baseline as per own reporting  Stairs            Wheelchair Mobility    Modified Rankin (Stroke Patients Only)       Balance Overall balance assessment: Needs assistance Sitting-balance support: Feet supported;Single extremity supported Sitting balance-Leahy Scale: Fair Sitting balance - Comments: able to maintain sitting edge of bed with RUE to hold on   Standing balance support: Single extremity supported;During functional activity Standing balance-Leahy Scale: Poor                               Pertinent Vitals/Pain Pain Assessment: No/denies pain    Home Living  Family/patient expects to be discharged to:: Private residence Living Arrangements: Other relatives Available Help at Discharge: Family Type of Home: House Home Access: Level entry     Home Layout: One [place]: Environmental consultant - 2 wheels;Shower seat;Wheelchair - manual Additional Comments: states daughter lives with her but she works and at times she is alone but had home care nurse prior to illness. Home care nurse was halted as nurse did not want to be tested against covid.    Prior Function Level of Independence: Needs assistance   Gait / Transfers Assistance Needed: assisted with transfers states does not use w/c mostly in bed  and chair  ADL's / Homemaking Assistance Needed: Assist from family  Comments: from chart review pt has given varying hx of PLOF     Hand Dominance   Dominant Hand: Right    Extremity/Trunk Assessment   Upper Extremity Assessment Upper Extremity Assessment: Defer to OT evaluation    Lower Extremity Assessment Lower Extremity Assessment: Generalized weakness(L>R)    Cervical / Trunk Assessment Cervical / Trunk Assessment: Normal  Communication   Communication: Expressive difficulties;HOH  Cognition Arousal/Alertness: Lethargic Behavior During Therapy: WFL for tasks assessed/performed Overall Cognitive Status: No family/caregiver present to determine baseline cognitive functioning                                 General Comments: history is different each time she is asked. she seems to be clear today but not sure about reliability of info given      General Comments      Exercises     Assessment/Plan    PT Assessment Patient needs continued PT services  PT Problem List Decreased strength;Decreased activity tolerance;Decreased balance;Decreased range of motion;Decreased mobility;Decreased coordination;Decreased knowledge of use of DME;Decreased safety awareness       PT Treatment Interventions DME instruction;Functional mobility training;Therapeutic activities;Therapeutic exercise;Balance training;Neuromuscular re-education;Patient/family education    PT Goals (Current goals can be found in the Care Plan section)  Acute Rehab PT Goals Patient Stated Goal: To go home PT Goal Formulation: With patient Time For Goal Achievement: 04/19/19 Potential to Achieve Goals: Fair    Frequency Min 3X/week   Barriers to discharge Decreased caregiver support      Co-evaluation               AM-PAC PT "6 Clicks" Mobility  Outcome Measure Help needed turning from your back to your side while in a flat bed without using bedrails?: A Little Help needed  moving from lying on your back to sitting on the side of a flat bed without using bedrails?: A Lot Help needed moving to and from a bed to a chair (including a wheelchair)?: A Lot Help needed standing up from a chair using your arms (e.g., wheelchair or bedside chair)?: A Lot Help needed to walk in hospital room?: Total Help needed climbing 3-5 steps with a railing? : Total 6 Click Score: 11    End of Session Equipment Utilized During Treatment: Gait belt Activity Tolerance: Patient limited by fatigue Patient left: in chair;with call bell/phone within reach;with chair alarm set Nurse Communication: Mobility status PT Visit Diagnosis: Unsteadiness on feet (R26.81);Other abnormalities of gait and mobility (R26.89);Muscle weakness (generalized) (M62.81)    Time: [id number] PT Time Calculation (min) (ACUTE ONLY): 25 min   Charges:   PT Evaluation $PT Eval Moderate Complexity: 1 Mod PT Treatments $Therapeutic Activity: 8-22 mins  Horald Chestnut, PT   Delford Field 04/05/2019, 12:55 PM

## 2019-04-05 NOTE — Progress Notes (Signed)
Lower extremity venous has been completed.   Preliminary results in CV Proc.   Abram Sander 04/05/2019 8:56 AM

## 2019-04-06 ENCOUNTER — Inpatient Hospital Stay (HOSPITAL_COMMUNITY): Payer: Medicare Other

## 2019-04-06 LAB — CBC WITH DIFFERENTIAL/PLATELET
Abs Immature Granulocytes: 0.02 10*3/uL (ref 0.00–0.07)
Basophils Absolute: 0 10*3/uL (ref 0.0–0.1)
Basophils Relative: 0 %
Eosinophils Absolute: 0 10*3/uL (ref 0.0–0.5)
Eosinophils Relative: 0 %
HCT: 43.5 % (ref 36.0–46.0)
Hemoglobin: 13.8 g/dL (ref 12.0–15.0)
Immature Granulocytes: 0 %
Lymphocytes Relative: 39 %
Lymphs Abs: 2 10*3/uL (ref 0.7–4.0)
MCH: 26.5 pg (ref 26.0–34.0)
MCHC: 31.7 g/dL (ref 30.0–36.0)
MCV: 83.5 fL (ref 80.0–100.0)
Monocytes Absolute: 0.6 10*3/uL (ref 0.1–1.0)
Monocytes Relative: 12 %
Neutro Abs: 2.6 10*3/uL (ref 1.7–7.7)
Neutrophils Relative %: 49 %
Platelets: 167 10*3/uL (ref 150–400)
RBC: 5.21 MIL/uL — ABNORMAL HIGH (ref 3.87–5.11)
RDW: 13.1 % (ref 11.5–15.5)
WBC: 5.3 10*3/uL (ref 4.0–10.5)
nRBC: 0 % (ref 0.0–0.2)

## 2019-04-06 LAB — COMPREHENSIVE METABOLIC PANEL
ALT: 15 U/L (ref 0–44)
AST: 27 U/L (ref 15–41)
Albumin: 3 g/dL — ABNORMAL LOW (ref 3.5–5.0)
Alkaline Phosphatase: 49 U/L (ref 38–126)
Anion gap: 10 (ref 5–15)
BUN: 35 mg/dL — ABNORMAL HIGH (ref 8–23)
CO2: 24 mmol/L (ref 22–32)
Calcium: 8.3 mg/dL — ABNORMAL LOW (ref 8.9–10.3)
Chloride: 105 mmol/L (ref 98–111)
Creatinine, Ser: 1.17 mg/dL — ABNORMAL HIGH (ref 0.44–1.00)
GFR calc Af Amer: 52 mL/min — ABNORMAL LOW (ref >60)
GFR calc non Af Amer: 45 mL/min — ABNORMAL LOW (ref >60)
Glucose, Bld: 227 mg/dL — ABNORMAL HIGH (ref 70–99)
Potassium: 4.5 mmol/L (ref 3.5–5.1)
Sodium: 139 mmol/L (ref 135–145)
Total Bilirubin: 0.3 mg/dL (ref 0.3–1.2)
Total Protein: 6.7 g/dL (ref 6.5–8.1)

## 2019-04-06 LAB — GLUCOSE, CAPILLARY
Glucose-Capillary: 183 mg/dL — ABNORMAL HIGH (ref 70–99)
Glucose-Capillary: 144 mg/dL — ABNORMAL HIGH (ref 70–99)
Glucose-Capillary: 146 mg/dL — ABNORMAL HIGH (ref 70–99)
Glucose-Capillary: 218 mg/dL — ABNORMAL HIGH (ref 70–99)
Glucose-Capillary: 190 mg/dL — ABNORMAL HIGH (ref 70–99)

## 2019-04-06 LAB — D-DIMER, QUANTITATIVE: D-Dimer, Quant: 1.14 ug/mL-FEU — ABNORMAL HIGH (ref 0.00–0.50)

## 2019-04-06 LAB — C-REACTIVE PROTEIN: CRP: 2.6 mg/dL — ABNORMAL HIGH (ref <1.0)

## 2019-04-06 LAB — FERRITIN: Ferritin: 146 ng/mL (ref 11–307)

## 2019-04-06 LAB — MAGNESIUM: Magnesium: 1.7 mg/dL (ref 1.7–2.4)

## 2019-04-06 LAB — PHOSPHORUS: Phosphorus: 2.8 mg/dL (ref 2.5–4.6)

## 2019-04-06 NOTE — Progress Notes (Addendum)
PROGRESS NOTE    Michelle Lopez  YFV:[id number] DOB: 05/24/1945 DOA: 04/03/2019 PCP: Janith Lima, Lopez   Brief Narrative:  Michelle Lopez is Michelle Lopez 74 y.o. female with medical history significant of CVA with residual left-sided weakness, IDDM, hypertension and hyperlipidemia presented with dry cough, pleuritic chest pain, and increasing short of breath for 3 days.   Her symptoms started 3 days ago, with generalized weakness and aching.  Gradually she developed short of breath and dry cough.  Denies loss of taste, nausea vomiting or diarrhea, denies sore throat runny nose, no fever but episodes of chills.  She lives with her daughter, and she denied any known COVID-19 contact. ED Course: COVID-19 positive, tachypneic, borderline hypoxia, stabilized with 2 L oxygen.  Assessment & Plan:   Active Problems:   COVID-19 virus infection   COVID-19   Acute respiratory failure with hypoxia ([place])  COVID-19 infection with impending respiratory failure CXR 2/28 without acute disease On RA, O2 sats on lower side in the low 90's Steroids/remdesivir started on admission in setting of her tachypnea (will d/c steroids with no o2 requirement or CXR findings) I/O, daily weights IS, OOB, prone as able Follow LE [place] (negative for DVT)  COVID-19 Labs  Recent Labs    04/04/19 0342 04/05/19 0401 04/06/19 0323  DDIMER 1.79* 1.57* 1.14*  FERRITIN 90 157 146  CRP 3.9* 3.7* 2.6*    No results found for: SARSCOV2NAA  Fever: likely 2/2 above, follow cultures - blood cx NGTD x 3 (urine cx with multiple species)  AKI Baseline 12/2018 was ~1.11 improving Follow with IVF Renal US with mildly enlarged kidneys suggesting ATN vs interstitial nephritis (UA unimpressive on presentation - suspect ATN from hypovolemia, poor intake more likely) Follow UA (negative protein, 0-5 RBC), renal US (pending).  CK mildly elevated.   suspect dehydration from poor oral intake Hold thiazide and ARB  Hyperkalemia:  resolved  T2DM SSI + lantus - adjusting due to poor PO intake, follow  Hypertension Controlled, continue home meds (amlodipine and metoprolol).  Hold thiazide and arb.  History of CVA Left-sided weakness chronic Aspirin, plavix and statin  DVT prophylaxis: heparin Code Status: full  Family Communication: none at bedside - discussed with daughter Rosaria Ferries 2/28 Disposition Plan:  . Patient came from: home            . Anticipated d/c place: SNF vs home . Barriers to d/c OR conditions which need to be met to effect Michelle Lopez safe d/c: pending improvement in renal function, therapy eval  Consultants:   none  Procedures:  none  Antimicrobials:  Anti-infectives (From admission, onward)   Start     Dose/Rate Route Frequency Ordered Stop   04/04/19 1000  remdesivir 100 mg in sodium chloride 0.9 % 100 mL IVPB     100 mg 200 mL/hr over 30 Minutes Intravenous Daily 04/03/19 1252 04/08/19 0959   04/03/19 1400  remdesivir 200 mg in sodium chloride 0.9% 250 mL IVPB     200 mg 580 mL/hr over 30 Minutes Intravenous Once 04/03/19 1252 04/03/19 1900     Subjective: No new complaints She'd prefer not to go to SNF, but is willing if she has to  Objective: Vitals:   04/06/19 0645 04/06/19 0817 04/06/19 0945 04/06/19 0947  BP:  128/63    Pulse:  73    Resp: (!) 21 19    Temp:  97.9 F (36.6 C)    TempSrc:  Oral    SpO2: 94% 91% 92% 92%  Weight:      Height:        Intake/Output Summary (Last 24 hours) at 04/06/2019 1206 Last data filed at 04/06/2019 1023 Gross per 24 hour  Intake 420 ml  Output 600 ml  Net -180 ml   Filed Weights   04/03/19 1147  Weight: 70.3 kg    Examination:  General: No acute distress. Cardiovascular: Heart sounds show Michelle Lopez regular rate, and rhythm. Lungs: Clear to auscultation bilaterally  Abdomen: Soft, nontender, nondistended  Neurological: Alert and oriented 3. Moves all extremities 4 . Cranial nerves II through XII grossly intact. Skin: Warm and  dry. No rashes or lesions. Extremities: No clubbing or cyanosis. No edema.   Data Reviewed: I have personally reviewed following labs and imaging studies  CBC: Recent Labs  Lab 04/03/19 1048 04/04/19 0342 04/05/19 0401 04/06/19 0323  WBC 5.1 3.2* 4.1 5.3  NEUTROABS 3.2 2.4 1.8 2.6  HGB 14.4 13.2 13.2 13.8  HCT 47.3* 42.9 41.7 43.5  MCV 84.6 84.6 81.9 83.5  PLT 162 151 154 161   Basic Metabolic Panel: Recent Labs  Lab 04/03/19 1048 04/04/19 0342 04/04/19 0821 04/05/19 0401 04/06/19 0323  NA 140 136  --  139 139  K 4.0 6.1* 4.1 4.1 4.5  CL 106 104  --  105 105  CO2 22 22  --  24 24  GLUCOSE 159* 258*  --  137* 227*  BUN 49* 35*  --  39* 35*  CREATININE 2.15* 1.51*  --  1.29* 1.17*  CALCIUM 9.1 8.3*  --  8.4* 8.3*  MG  --  1.7  --  1.6* 1.7  PHOS  --  2.1*  --  2.7 2.8   GFR: Estimated Creatinine Clearance: 38.5 mL/min (Michelle Lopez) (by C-G formula based on SCr of 1.17 mg/dL (H)). Liver Function Tests: Recent Labs  Lab 04/03/19 1048 04/04/19 0342 04/05/19 0401 04/06/19 0323  AST 36 50* 28 27  ALT _0 ALKPHOS 61 45 48 49  BILITOT 0.8 1.4* 0.4 0.3  PROT 8.1 7.0 6.9 6.7  ALBUMIN 3.7 3.2* 3.1* 3.0*   No results for input(s): LIPASE, AMYLASE in the last 168 hours. No results for input(s): AMMONIA in the last 168 hours. Coagulation Profile: Recent Labs  Lab 04/03/19 1048  INR 0.9   Cardiac Enzymes: Recent Labs  Lab 04/03/19 1048  CKTOTAL 327*   BNP (last 3 results) No results for input(s): PROBNP in the last 8760 hours. HbA1C: No results for input(s): HGBA1C in the last 72 hours. CBG: Recent Labs  Lab 04/05/19 1128 04/05/19 1712 04/05/19 2132 04/06/19 0745 04/06/19 1141  GLUCAP 144* 189* 173* 183* 144*   Lipid Profile: No results for input(s): CHOL, HDL, LDLCALC, TRIG, CHOLHDL, LDLDIRECT in the last 72 hours. Thyroid Function Tests: No results for input(s): TSH, T4TOTAL, FREET4, T3FREE, THYROIDAB in the last 72 hours. Anemia Panel: Recent  Labs    04/05/19 0401 04/06/19 0323  FERRITIN 157 146   Sepsis Labs: Recent Labs  Lab 04/03/19 1048 04/03/19 1828 04/04/19 0342  LATICACIDVEN 1.2 2.9* 2.0*    Recent Results (from the past 240 hour(s))  Blood Culture (routine x 2)     Status: None (Preliminary result)   Collection Time: 04/03/19 12:15 PM   Specimen: BLOOD  Result Value Ref Range Status   Specimen Description BLOOD SITE NOT SPECIFIED  Final   Special Requests   Final    BOTTLES DRAWN AEROBIC AND ANAEROBIC Blood Culture results may not  be optimal due to an inadequate volume of blood received in culture bottles   Culture   Final    NO GROWTH 3 DAYS Performed at [facility], [place] [address] [place], [place] [address]    Report Status PENDING  Incomplete  Blood Culture (routine x 2)     Status: None (Preliminary result)   Collection Time: 04/03/19 12:37 PM   Specimen: BLOOD  Result Value Ref Range Status   Specimen Description BLOOD LEFT ANTECUBITAL  Final   Special Requests   Final    BOTTLES DRAWN AEROBIC ONLY Blood Culture results may not be optimal due to an inadequate volume of blood received in culture bottles   Culture   Final    NO GROWTH 3 DAYS Performed at [facility], [place] [address], [place], [place] [address]    Report Status PENDING  Incomplete  Urine culture     Status: Abnormal   Collection Time: 04/03/19  6:00 PM   Specimen: In/Out Cath Urine  Result Value Ref Range Status   Specimen Description   Final    IN/OUT CATH URINE Performed at [facility] [address] [place], [place] [address]    Special Requests   Final    NONE Performed at [facility], [place] [address] [place],  [address]    Culture MULTIPLE SPECIES PRESENT, SUGGEST RECOLLECTION (Michelle Lopez)  Final   Report Status 04/04/2019 FINAL  Final         Radiology Studies: DG CHEST PORT 1 VIEW  Result Date: 04/06/2019 CLINICAL DATA:  Hypoxia EXAM: PORTABLE CHEST 1 VIEW  COMPARISON:  04/04/2019 FINDINGS: Heart size and vascularity normal. Negative for heart failure. Improved aeration right lung base. Lung bases now clear without infiltrate or effusion. Atherosclerotic aorta. IMPRESSION: No active disease. Electronically Signed   By: Michelle Gallo M.D.   On: 04/06/2019 09:40   VAS [place] LOWER EXTREMITY VENOUS (DVT)  Result Date: 04/05/2019  Lower Venous DVTStudy Indications: Elevated ddimer.  Comparison Study: no prior Performing Technologist: Michelle Lopez  Examination Guidelines: Michelle Lopez complete evaluation includes B-mode imaging, spectral Doppler, color Doppler, and power Doppler as needed of all accessible portions of each vessel. Bilateral testing is considered an integral part of Amare Kontos complete examination. Limited examinations for reoccurring indications may be performed as noted. The reflux portion of the exam is performed with the patient in reverse Trendelenburg.  +---------+---------------+---------+-----------+----------+--------------+ RIGHT    CompressibilityPhasicitySpontaneityPropertiesThrombus Aging +---------+---------------+---------+-----------+----------+--------------+ CFV      Full           Yes      Yes                                 +---------+---------------+---------+-----------+----------+--------------+ SFJ      Full                                                        +---------+---------------+---------+-----------+----------+--------------+ FV Prox  Full                                                        +---------+---------------+---------+-----------+----------+--------------+  FV Mid   Full                                                        +---------+---------------+---------+-----------+----------+--------------+ FV DistalFull                                                        +---------+---------------+---------+-----------+----------+--------------+ PFV      Full                                                         +---------+---------------+---------+-----------+----------+--------------+ POP      Full           Yes      Yes                                 +---------+---------------+---------+-----------+----------+--------------+ PTV                                                   Not visualized +---------+---------------+---------+-----------+----------+--------------+ PERO                                                  Not visualized +---------+---------------+---------+-----------+----------+--------------+   +---------+---------------+---------+-----------+----------+--------------+ LEFT     CompressibilityPhasicitySpontaneityPropertiesThrombus Aging +---------+---------------+---------+-----------+----------+--------------+ CFV      Full           Yes      Yes                                 +---------+---------------+---------+-----------+----------+--------------+ SFJ      Full                                                        +---------+---------------+---------+-----------+----------+--------------+ FV Prox  Full                                                        +---------+---------------+---------+-----------+----------+--------------+ FV Mid   Full                                                        +---------+---------------+---------+-----------+----------+--------------+ FV  DistalFull                                                        +---------+---------------+---------+-----------+----------+--------------+ PFV      Full                                                        +---------+---------------+---------+-----------+----------+--------------+ POP      Full           Yes      Yes                                 +---------+---------------+---------+-----------+----------+--------------+ PTV      Full                                                         +---------+---------------+---------+-----------+----------+--------------+ PERO     Full                                                        +---------+---------------+---------+-----------+----------+--------------+     Summary: BILATERAL: - No evidence of deep vein thrombosis seen in the lower extremities, bilaterally.   *See table(s) above for measurements and observations. Electronically signed by Michelle Lopez on 04/05/2019 at 3:58:35 PM.    Final         Scheduled Meds: . amLODipine  5 mg Oral Daily  . vitamin C  500 mg Oral Daily  . aspirin  81 mg Oral Daily  . atorvastatin  20 mg Oral Daily  . clopidogrel  75 mg Oral Daily  . dexamethasone  6 mg Oral Daily  . docusate sodium  100 mg Oral BID  . feeding supplement (GLUCERNA SHAKE)  237 mL Oral TID BM  . heparin  5,000 Units Subcutaneous Q8H  . insulin aspart  0-20 Units Subcutaneous TID WC  . insulin aspart  0-5 Units Subcutaneous QHS  . insulin glargine  28 Units Subcutaneous Daily  . ipratropium  2 puff Inhalation Q6H  . loratadine  10 mg Oral Daily  . metoprolol tartrate  12.5 mg Oral BID  . tamsulosin  0.4 mg Oral Daily  . Vitamin D (Ergocalciferol)  50,000 Units Oral Q7 days  . zinc sulfate  220 mg Oral Daily   Continuous Infusions: . remdesivir 100 mg in NS 100 mL 100 mg (04/06/19 0901)     LOS: 3 days    Time spent: over 30 min     Michelle Helper, Lopez [facility]   To contact the attending provider between 7A-7P or the covering provider during after hours 7P-7A, please log into the web site [email or web address] and access using universal [place] password for that web site. If you do not have the password, please call the hospital  operator.  04/06/2019, 12:06 PM

## 2019-04-06 NOTE — Progress Notes (Signed)
Physical Therapy Treatment Patient Details Name: Michelle Lopez MRN: [id number] DOB: 22-Jan-1946 Today's Date: 04/06/2019    History of Present Illness 74 y.o. female with medical history significant of CVA with residual left-sided weakness, IDDM, hypertension and hyperlipidemia presented with dry cough, pleuritic chest pain, and increasing short of breath for 3 days.    Her symptoms started 3 days ago, with generalized weakness and aching.  Gradually she developed short of breath and dry cough.     PT Comments    Patient was in [facility] chair when PT arrived. On RA. Maintained SPO2 at > 92%. She had not eaten breakfast and refused to eat it until it was warmed up. Wanted to use BS commode before eating. Mod-max A for chair <>BS commode- very unsteady, max cues to take steps. Of note, she had diarrhea- reported to DR. Power and Therapist, sports. Reminded about pursed lip breathing but has difficulty processing for effective return demo. Practiced ankle pumps, SAQs. She presents as very unsafe to be at home unless she has 24/7 supervision. She is at risk for falls. She indicated that she was upset about the possibility of REHAB, but did not want to discuss this. Dr. Florene Glen will discuss with family. She remained in [facility] chair. Breakfast had been warmed up along with coffee- but she really was not eating. Remote/call light, telephone all in reach. Tray in front of patient.   Follow Up Recommendations  Supervision/Assistance - 24 hour;SNF(She does not present as safe to be alone for long periods with family at work)     Equipment Recommendations  None recommended by PT    Recommendations for Other Services       Precautions / Restrictions Precautions Precautions: Fall Precaution Comments: L side hemiparesis/ weakness Restrictions Weight Bearing Restrictions: No    Mobility  Bed Mobility Overal bed mobility: Needs Assistance Bed Mobility: Supine to Sit     Supine to sit: Mod assist         Transfers Overall transfer level: Needs assistance Equipment used: 2 person hand held assist Transfers: Sit to/from [facility] Sit to Stand: Mod assist Stand pivot transfers: Mod assist;Max assist       General transfer comment: needs to pivot to RIGHT side  Ambulation/Gait Ambulation/Gait assistance: Mod assist;Max assist Gait Distance (Feet): 10 Feet Assistive device: (Mod, max of 1 from BS chair <>BS commode)     Gait velocity interpretation: <1.8 ft/sec, indicate of risk for recurrent falls General Gait Details: pt does not ambulate at baseline as per own reporting///she states she tends to generally stay in bed. Does walk "a little" with cane or walker when nurse/aide is available in the home. Alone in home frequently when daughter is at work   Marine scientist [place] (Stroke Patients Only)       Balance Overall balance assessment: Needs assistance Sitting-balance support: Feet supported;Single extremity supported Sitting balance-Leahy Scale: Fair Sitting balance - Comments: able to maintain sitting edge of bed with RUE to hold on Postural control: Left lateral lean Standing balance support: Single extremity supported;During functional activity Standing balance-Leahy Scale: Poor                              Cognition Arousal/Alertness: Awake/alert Behavior During Therapy: WFL for tasks assessed/performed;Flat affect Overall Cognitive Status: No family/caregiver present to determine baseline cognitive functioning  General Comments: history is different each time she is asked. she seems to be clear today but not sure about reliability of info given...slow to process and answer/complete task- upset about the possibility of SNF- but she is very unsteady.      Exercises General Exercises - Lower Extremity Ankle Circles/Pumps: AROM;AAROM;Seated Short  Arc Quad: AROM;Seated Toe Raises: AROM;Seated Other Exercises Other Exercises: Practiced pursed lip breathing= has difficulty processing to return demo effectively    General Comments        Pertinent Vitals/Pain Pain Assessment: No/denies pain    Home Living Family/patient expects to be discharged to:: Private residence Living Arrangements: Other relatives Available Help at Discharge: Family Type of Home: House Home Access: Level entry            Prior Function            PT Goals (current goals can now be found in the care plan section) Acute Rehab PT Goals Patient Stated Goal: To go home PT Goal Formulation: With patient Time For Goal Achievement: 04/19/19 Potential to Achieve Goals: Fair Progress towards PT goals: PT to reassess next treatment    Frequency    Min 3X/week      PT Plan      Co-evaluation              AM-PAC PT "6 Clicks" Mobility   Outcome Measure  Help needed turning from your back to your side while in a flat bed without using bedrails?: A Little Help needed moving from lying on your back to sitting on the side of a flat bed without using bedrails?: A Lot Help needed moving to and from a bed to a chair (including a wheelchair)?: A Lot Help needed standing up from a chair using your arms (e.g., wheelchair or bedside chair)?: A Lot Help needed to walk in hospital room?: A Lot Help needed climbing 3-5 steps with a railing? : Total 6 Click Score: 12    End of Session   Activity Tolerance: Patient limited by fatigue Patient left: in chair;with call bell/phone within reach;with chair alarm set Nurse Communication: Mobility status PT Visit Diagnosis: Unsteadiness on feet (R26.81);Other abnormalities of gait and mobility (R26.89);Muscle weakness (generalized) (M62.81)     Time: [id number] PT Time Calculation (min) (ACUTE ONLY): 44 min  Charges:  $Gait Training: 8-22 mins $Therapeutic Exercise: 8-22 mins $Therapeutic Activity:  8-22 mins          Rollen Sox, PT # [phone] CGV cell          Casandra Doffing 04/06/2019, 9:57 AM

## 2019-04-06 NOTE — Progress Notes (Signed)
Occupational Therapy Treatment Patient Details Name: Michelle Lopez MRN: [id number] DOB: 11-Jan-1946 Today's Date: 04/06/2019    History of present illness 74 y.o. female with medical history significant of CVA with residual left-sided weakness, IDDM, hypertension and hyperlipidemia presented with dry cough, pleuritic chest pain, and increasing short of breath for 3 days.    Her symptoms started 3 days ago, with generalized weakness and aching.  Gradually she developed short of breath and dry cough.    OT comments  Pt making progress in therapy demonstrating improved independence with functional transfers this date. Pt required min to mod encouragement to participate in therapy tasks due to fatigue. Educated pt on importance of daily activity with fair understanding. Pt tolerated sitting edge of bed ~10 min with supervision, noting 0 instances of loss of balance. Pt required min to mod assist for stand pivot transfer to bedside chair with hand held assist. Noted 0 instances of loss of balance, however pt unsteady on feet. Pt declined participating in majority of self-care tasks this date due to fatigue. Educated/instructed pt on safety strategies, fall prevention, and energy conservation with handouts provided. Pt on room air with SpO2 maintaining in 90s throughout. Educated pt on relaxation techniques as pt reported anxiety related to shortness of breath. OT will continue to follow acutely.    Follow Up Recommendations  SNF    Equipment Recommendations  None recommended by OT    Recommendations for Other Services      Precautions / Restrictions Precautions Precautions: Fall Precaution Comments: L side hemiparesis/ weakness Restrictions Weight Bearing Restrictions: No       Mobility Bed Mobility Overal bed mobility: Needs Assistance Bed Mobility: Supine to Sit     Supine to sit: Min guard;HOB elevated     General bed mobility comments: Use of bed rail and increased  time  Transfers Overall transfer level: Needs assistance Equipment used: 1 person hand held assist Transfers: Sit to/from [facility] Sit to Stand: Min assist Stand pivot transfers: Min assist;Mod assist       General transfer comment: Assist for balance and safety.     Balance Overall balance assessment: Needs assistance Sitting-balance support: Feet supported;Single extremity supported Sitting balance-Leahy Scale: Fair       Standing balance-Leahy Scale: Poor                             ADL either performed or assessed with clinical judgement   ADL Overall ADL's : Needs assistance/impaired Eating/Feeding: Set up;Sitting   Grooming: Set up;Supervision/safety;Wash/dry hands;Wash/dry face;Sitting                               Functional mobility during ADLs: Minimal assistance;Moderate assistance General ADL Comments: Pt able to stand pivot to bedside chair.     Vision       Perception     Praxis      Cognition Arousal/Alertness: Awake/alert Behavior During Therapy: WFL for tasks assessed/performed;Flat affect Overall Cognitive Status: No family/caregiver present to determine baseline cognitive functioning                                 General Comments: Increased time to process instructions and cues.        Exercises     Shoulder Instructions       General Comments Pt  on room air with SpO2 maintaining in 90s throughout.    Pertinent Vitals/ Pain       Pain Assessment: No/denies pain  Home Living                                          Prior Functioning/Environment              Frequency           Progress Toward Goals  OT Goals(current goals can now be found in the care plan section)  Progress towards OT goals: Progressing toward goals  ADL Goals Pt Will Perform Eating: with set-up;sitting Pt Will Perform Grooming: with set-up;sitting Pt Will Perform  Upper Body Bathing: with set-up;sitting Pt Will Transfer to Toilet: with mod assist;bedside commode;stand pivot transfer Pt/caregiver will Perform Home Exercise Program: Increased strength;Increased ROM;Both right and left upper extremity;With theraband;With Supervision  Plan Discharge plan remains appropriate    Co-evaluation                 AM-PAC OT "6 Clicks" Daily Activity     Outcome Measure   Help from another person eating meals?: A Little Help from another person taking care of personal grooming?: A Little Help from another person toileting, which includes using toliet, bedpan, or urinal?: A Lot Help from another person bathing (including washing, rinsing, drying)?: A Lot Help from another person to put on and taking off regular upper body clothing?: A Lot Help from another person to put on and taking off regular lower body clothing?: A Lot 6 Click Score: 14    End of Session Equipment Utilized During Treatment: Gait belt  OT Visit Diagnosis: Unsteadiness on feet (R26.81);Other abnormalities of gait and mobility (R26.89);Muscle weakness (generalized) (M62.81);Feeding difficulties (R63.3);Other symptoms and signs involving the nervous system (R29.898);Hemiplegia and hemiparesis Hemiplegia - Right/Left: Left Hemiplegia - dominant/non-dominant: Non-Dominant   Activity Tolerance Patient limited by fatigue   Patient Left in chair;with call bell/phone within reach;with chair alarm set   Nurse Communication Mobility status        Time: [id number] OT Time Calculation (min): 30 min  Charges: OT General Charges $OT Visit: 1 Visit OT Treatments $Therapeutic Activity: 23-37 mins  Mauri Brooklyn OTR/L [phone]   Mauri Brooklyn 04/06/2019, 4:01 PM

## 2019-04-06 NOTE — Plan of Care (Signed)
  Problem: Education: Goal: Knowledge of risk factors and measures for prevention of condition will improve Outcome: Progressing   

## 2019-04-07 ENCOUNTER — Other Ambulatory Visit: Payer: Self-pay | Admitting: *Deleted

## 2019-04-07 LAB — CBC WITH DIFFERENTIAL/PLATELET
Abs Immature Granulocytes: 0.02 10*3/uL (ref 0.00–0.07)
Basophils Absolute: 0 10*3/uL (ref 0.0–0.1)
Basophils Relative: 0 %
Eosinophils Absolute: 0 10*3/uL (ref 0.0–0.5)
Eosinophils Relative: 0 %
HCT: 40.6 % (ref 36.0–46.0)
Hemoglobin: 12.9 g/dL (ref 12.0–15.0)
Immature Granulocytes: 0 %
Lymphocytes Relative: 50 %
Lymphs Abs: 3 10*3/uL (ref 0.7–4.0)
MCH: 26.2 pg (ref 26.0–34.0)
MCHC: 31.8 g/dL (ref 30.0–36.0)
MCV: 82.5 fL (ref 80.0–100.0)
Monocytes Absolute: 0.5 10*3/uL (ref 0.1–1.0)
Monocytes Relative: 9 %
Neutro Abs: 2.4 10*3/uL (ref 1.7–7.7)
Neutrophils Relative %: 41 %
Platelets: 191 10*3/uL (ref 150–400)
RBC: 4.92 MIL/uL (ref 3.87–5.11)
RDW: 13 % (ref 11.5–15.5)
WBC: 6 10*3/uL (ref 4.0–10.5)
nRBC: 0 % (ref 0.0–0.2)

## 2019-04-07 LAB — COMPREHENSIVE METABOLIC PANEL
ALT: 15 U/L (ref 0–44)
AST: 21 U/L (ref 15–41)
Albumin: 2.7 g/dL — ABNORMAL LOW (ref 3.5–5.0)
Alkaline Phosphatase: 49 U/L (ref 38–126)
Anion gap: 8 (ref 5–15)
BUN: 35 mg/dL — ABNORMAL HIGH (ref 8–23)
CO2: 24 mmol/L (ref 22–32)
Calcium: 8 mg/dL — ABNORMAL LOW (ref 8.9–10.3)
Chloride: 107 mmol/L (ref 98–111)
Creatinine, Ser: 1.04 mg/dL — ABNORMAL HIGH (ref 0.44–1.00)
GFR calc Af Amer: >60 mL/min (ref >60)
GFR calc non Af Amer: 52 mL/min — ABNORMAL LOW (ref >60)
Glucose, Bld: 87 mg/dL (ref 70–99)
Potassium: 3.8 mmol/L (ref 3.5–5.1)
Sodium: 139 mmol/L (ref 135–145)
Total Bilirubin: 0.5 mg/dL (ref 0.3–1.2)
Total Protein: 6.3 g/dL — ABNORMAL LOW (ref 6.5–8.1)

## 2019-04-07 LAB — FERRITIN: Ferritin: 159 ng/mL (ref 11–307)

## 2019-04-07 LAB — GLUCOSE, CAPILLARY
Glucose-Capillary: 62 mg/dL — ABNORMAL LOW (ref 70–99)
Glucose-Capillary: 130 mg/dL — ABNORMAL HIGH (ref 70–99)
Glucose-Capillary: 263 mg/dL — ABNORMAL HIGH (ref 70–99)
Glucose-Capillary: 123 mg/dL — ABNORMAL HIGH (ref 70–99)
Glucose-Capillary: 65 mg/dL — ABNORMAL LOW (ref 70–99)
Glucose-Capillary: 76 mg/dL (ref 70–99)
Glucose-Capillary: 125 mg/dL — ABNORMAL HIGH (ref 70–99)

## 2019-04-07 LAB — D-DIMER, QUANTITATIVE: D-Dimer, Quant: 0.93 ug/mL-FEU — ABNORMAL HIGH (ref 0.00–0.50)

## 2019-04-07 LAB — PHOSPHORUS: Phosphorus: 2.6 mg/dL (ref 2.5–4.6)

## 2019-04-07 LAB — C-REACTIVE PROTEIN: CRP: 1.6 mg/dL — ABNORMAL HIGH (ref <1.0)

## 2019-04-07 LAB — MAGNESIUM: Magnesium: 1.6 mg/dL — ABNORMAL LOW (ref 1.7–2.4)

## 2019-04-07 MED ORDER — MAGNESIUM SULFATE 2 GM/50ML IV SOLN
2.0000 g | Freq: Once | INTRAVENOUS | Status: AC
  Administered 2019-04-07: 2 g via INTRAVENOUS
  Filled 2019-04-07: qty 50

## 2019-04-07 MED ORDER — INSULIN GLARGINE 100 UNIT/ML ~~LOC~~ SOLN
22.0000 [IU] | Freq: Every day | SUBCUTANEOUS | Status: DC
  Filled 2019-04-07: qty 0.22

## 2019-04-07 NOTE — NC FL2 (Signed)
[place] LEVEL OF CARE SCREENING TOOL     IDENTIFICATION  Patient Name: Michelle Lopez Birthdate: 1945-11-03 Sex: female Admission Date (Current Location): 04/03/2019  [facility] and [place] Number:  Herbalist and Address:  [facility], [place] [address] [place], [place] [address]      Provider Number: [id number]  Attending Physician Name and Address:  Elodia Florence., *  Relative Name and Phone Number:  Cyndie Chime- daughter- [phone]    Current Level of Care: Hospital Recommended Level of Care: [place] Prior Approval Number:    Date Approved/Denied: 09/25/08 PASRR Number: [id number]  Discharge Plan: SNF    Current Diagnoses: Patient Active Problem List   Diagnosis Date Noted  . COVID-19 virus infection 04/03/2019  . COVID-19 04/03/2019  . Acute respiratory failure with hypoxia ([place])   . Tinea corporis 12/17/2018  . CRI (chronic renal insufficiency), stage 4 (severe) ([place]) 02/15/2018  . Protein-calorie malnutrition, mild ([place]) 10/17/2017  . B12 deficiency 08/23/2017  . Primary osteoarthritis of right hip 04/03/2017  . PAD (peripheral artery disease) ([place]) 07/24/2016  . Vitamin D deficiency 07/06/2015  . Osteopenia, senile 03/03/2014  . 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 Seborrheic dermatitis 03/05/2013  . Dysphagia as late effect of stroke 05/30/2011  . Dementia arising in the senium and presenium ([place]) 05/02/2011  . Routine general medical examination at a health care facility 05/02/2011  . Hyperlipidemia with target LDL less than 70   . Visit for screening mammogram 09/15/2010  . DEPRESSION 06/24/2009  . VERTIGO, CENTRAL 01/14/2009  . Insomnia 01/14/2009  . CVA (cerebral vascular accident) ([place]) 06/11/2008  . DM (diabetes mellitus), type 2 with renal complications ([place]) [id number]  . Essential hypertension, benign 06/05/2007    Orientation RESPIRATION  BLADDER Height & Weight     Self, Time, Situation, Place  O2(2L) External catheter Weight: 155 lb (70.3 kg) Height:  5\' 3"  (160 cm)  BEHAVIORAL SYMPTOMS/MOOD NEUROLOGICAL BOWEL NUTRITION STATUS      Continent Diet  AMBULATORY STATUS COMMUNICATION OF NEEDS Skin   Extensive Assist(per pt non-ambulatory baseline- is able to transfer) Verbally Normal                       Personal Care Assistance Level of Assistance  Bathing, Dressing Bathing Assistance: Limited assistance   Dressing Assistance: Limited assistance     Functional Limitations Info             SPECIAL CARE FACTORS FREQUENCY  PT (By licensed PT), OT (By licensed OT)     PT Frequency: 5x week OT Frequency: 5x week            Contractures Contractures Info: Not present    Additional Factors Info  Allergies, Isolation Precautions   Allergies Info: Morphine-Hives, Pravastatin Sodium-Rash, Simvastatin -Rash, Crestor(Rosuvastatin Calcium)- Intolerance-myalgias     Isolation Precautions Info: + COVID (Air/Contact precautions)     Current Medications (04/07/2019):  This is the current hospital active medication list Current Facility-Administered Medications  Medication Dose Route Frequency Provider Last Rate Last Admin  . acetaminophen (TYLENOL) tablet 650 mg  650 mg Oral Q6H PRN Lequita Halt, MD   650 mg at 04/04/19 1245  . amLODipine (NORVASC) tablet 5 mg  5 mg Oral Daily Wynetta Fines T, MD   5 mg at 04/07/19 0749  . ascorbic acid (VITAMIN C) tablet 500 mg  500 mg Oral Daily Wynetta Fines  T, MD   500 mg at 04/07/19 0750  . aspirin chewable tablet 81 mg  81 mg Oral Daily Wynetta Fines T, MD   81 mg at 04/07/19 0749  . atorvastatin (LIPITOR) tablet 20 mg  20 mg Oral Daily Wynetta Fines T, MD   20 mg at 04/07/19 0750  . clopidogrel (PLAVIX) tablet 75 mg  75 mg Oral Daily Wynetta Fines T, MD   75 mg at 04/07/19 0750  . docusate sodium (COLACE) capsule 100 mg  100 mg Oral BID Wynetta Fines T, MD   100 mg at 04/07/19 0750   . feeding supplement (GLUCERNA SHAKE) (GLUCERNA SHAKE) liquid 237 mL  237 mL Oral TID BM Wynetta Fines T, MD   237 mL at 04/07/19 0751  . heparin injection 5,000 Units  5,000 Units Subcutaneous Q8H Elodia Florence., MD   5,000 Units at 04/07/19 1427  . insulin aspart (novoLOG) injection 0-20 Units  0-20 Units Subcutaneous TID WC Elodia Florence., MD   11 Units at 04/07/19 1135  . insulin aspart (novoLOG) injection 0-5 Units  0-5 Units Subcutaneous QHS Elodia Florence., MD      . Derrill Memo ON 04/08/2019] insulin glargine (LANTUS) injection 22 Units  22 Units Subcutaneous Daily Elodia Florence., MD      . ipratropium (ATROVENT HFA) inhaler 2 puff  2 puff Inhalation Q6H Lequita Halt, MD   2 puff at 04/07/19 1430  . loratadine (CLARITIN) tablet 10 mg  10 mg Oral Daily Wynetta Fines T, MD   10 mg at 04/07/19 0750  . metoprolol tartrate (LOPRESSOR) tablet 12.5 mg  12.5 mg Oral BID Wynetta Fines T, MD   12.5 mg at 04/07/19 0749  . tamsulosin (FLOMAX) capsule 0.4 mg  0.4 mg Oral Daily Wynetta Fines T, MD   0.4 mg at 04/07/19 0750  . Vitamin D (Ergocalciferol) (DRISDOL) capsule 50,000 Units  50,000 Units Oral Q7 days Lequita Halt, MD   50,000 Units at 04/04/19 1056  . zinc sulfate capsule 220 mg  220 mg Oral Daily Wynetta Fines T, MD   220 mg at 04/07/19 [id number]     Discharge Medications: Please see discharge summary for a list of discharge medications.  Relevant Imaging Results:  Relevant Lab Results:   Additional Information SSN#-[id number]  Atilano Median, LCSW

## 2019-04-07 NOTE — Progress Notes (Signed)
Occupational Therapy Treatment Patient Details Name: Michelle Lopez MRN: [id number] DOB: 10-28-1945 Today's Date: 04/07/2019    History of present illness 74 y.o. female with medical history significant of CVA with residual left-sided weakness, IDDM, hypertension and hyperlipidemia presented with dry cough, pleuritic chest pain, and increasing short of breath for 3 days.    Her symptoms started 3 days ago, with generalized weakness and aching.  Gradually she developed short of breath and dry cough.    OT comments  Pt making slow steady progress in therapy, demonstrating improved independence with bed mobility this date. Pt able to complete supine to sit with supervision from a flat bed, utilizing bed rails for support. Pt tolerated sitting edge of bed ~5 min with supervision. Continued education with pt on safety strategies and fall prevention techniques with fair understanding. Pt able to stand pivot to bedside chair with mod assist. Educated/instructed pt on BUE level I theraband HEP with pt requiring min cues and demo on technique. Pt required rest breaks throughout due to fatigue. Pt on room air with SpO2 maintaining in 90s throughout. No reports of shortness of breath. OT will continue to follow acutely.      Follow Up Recommendations  SNF    Equipment Recommendations  None recommended by OT    Recommendations for Other Services      Precautions / Restrictions Precautions Precautions: Fall Precaution Comments: L side hemiparesis/ weakness Restrictions Weight Bearing Restrictions: No       Mobility Bed Mobility Overal bed mobility: Needs Assistance       Supine to sit: Supervision     General bed mobility comments: HOB flat, use of bed rails and increased time  Transfers Overall transfer level: Needs assistance Equipment used: 1 person hand held assist Transfers: Sit to/from [employer] Transfers Sit to Stand: Mod assist Stand pivot transfers: Mod assist        General transfer comment: Assist for balance and safety.     Balance Overall balance assessment: Needs assistance Sitting-balance support: Feet supported;Single extremity supported Sitting balance-Leahy Scale: Fair       Standing balance-Leahy Scale: Poor                             ADL either performed or assessed with clinical judgement   ADL Overall ADL's : Needs assistance/impaired                                     Functional mobility during ADLs: Moderate assistance General ADL Comments: Pt able to stand pivot to bedside chair.     Vision       Perception     Praxis      Cognition Arousal/Alertness: Awake/alert Behavior During Therapy: WFL for tasks assessed/performed;Flat affect Overall Cognitive Status: No family/caregiver present to determine baseline cognitive functioning                                 General Comments: Increased time to process instructions and cues.        Exercises Exercises: Other exercises Other Exercises Other Exercises: Educated/instructed pt on BUE level I theraband HEP with pt requiring min verbal cues and demo on technique.    Shoulder Instructions       General Comments Pt on room air with SpO2 maintaining in  90s throughout    Pertinent Vitals/ Pain       Pain Assessment: No/denies pain  Home Living                                          Prior Functioning/Environment              Frequency           Progress Toward Goals  OT Goals(current goals can now be found in the care plan section)  Progress towards OT goals: Progressing toward goals  ADL Goals Pt Will Perform Eating: with set-up;sitting Pt Will Perform Grooming: with set-up;sitting Pt Will Perform Upper Body Bathing: with set-up;sitting Pt Will Transfer to Toilet: with mod assist;bedside commode;stand pivot transfer Pt/caregiver will Perform Home Exercise Program: Increased  strength;Increased ROM;Both right and left upper extremity;With theraband;With Supervision  Plan Discharge plan remains appropriate    Co-evaluation    PT/OT/SLP Co-Evaluation/Treatment: Yes Reason for Co-Treatment: For patient/therapist safety;To address functional/ADL transfers   OT goals addressed during session: Strengthening/ROM      AM-PAC OT "6 Clicks" Daily Activity     Outcome Measure   Help from another person eating meals?: A Little Help from another person taking care of personal grooming?: A Little Help from another person toileting, which includes using toliet, bedpan, or urinal?: A Lot Help from another person bathing (including washing, rinsing, drying)?: A Lot Help from another person to put on and taking off regular upper body clothing?: A Lot Help from another person to put on and taking off regular lower body clothing?: A Lot 6 Click Score: 14    End of Session Equipment Utilized During Treatment: Gait belt  OT Visit Diagnosis: Unsteadiness on feet (R26.81);Other abnormalities of gait and mobility (R26.89);Muscle weakness (generalized) (M62.81);Feeding difficulties (R63.3);Other symptoms and signs involving the nervous system (R29.898);Hemiplegia and hemiparesis Hemiplegia - Right/Left: Left Hemiplegia - dominant/non-dominant: Non-Dominant   Activity Tolerance Patient limited by fatigue   Patient Left in chair;with call bell/phone within reach;with chair alarm set   Nurse Communication Mobility status        Time: [id number] OT Time Calculation (min): 31 min  Charges: OT General Charges $OT Visit: 1 Visit OT Treatments $Therapeutic Exercise: 8-22 mins  Mauri Brooklyn OTR/L [phone]   Mauri Brooklyn 04/07/2019, 3:51 PM

## 2019-04-07 NOTE — Progress Notes (Signed)
Physical Therapy Treatment Patient Details Name: Michelle Lopez MRN: [id number] DOB: Jun 20, 1945 Today's Date: 04/07/2019    History of Present Illness 74 y.o. female with medical history significant of CVA with residual left-sided weakness, IDDM, hypertension and hyperlipidemia presented with dry cough, pleuritic chest pain, and increasing short of breath for 3 days.    Her symptoms started 3 days ago, with generalized weakness and aching.  Gradually she developed short of breath and dry cough.     PT Comments    Patient was in bed when PT and OT arrived to room. She did agree to participate and reluctantly agreed to get OOB and sit in [facility] chair again. She was emotional during part of the session today- crying , saying she wanted to go home. She presents generally with flat affect. Instructed in LE ex as noted, printed sheet issued at bedside. Practiced IS and Flutter Valve unit with cues for proper use, and good return demo. Should benefit from PT to further address goals for optimal functional outcomes- she is at risk for falls, and requires min/mod A for all bed mobility , transfers and ambulation (with AD).   Follow Up Recommendations  Supervision/Assistance - 24 hour;SNF     Equipment Recommendations  None recommended by PT    Recommendations for Other Services       Precautions / Restrictions Precautions Precautions: Fall Precaution Comments: L side hemiparesis/ weakness Restrictions Weight Bearing Restrictions: No    Mobility  Bed Mobility Overal bed mobility: Needs Assistance Bed Mobility: Supine to Sit     Supine to sit: Supervision     General bed mobility comments: HOB flat, use of bed rails and increased time  Transfers Overall transfer level: Needs assistance Equipment used: 1 person hand held assist Transfers: Sit to/from [facility] Sit to Stand: Mod assist Stand pivot transfers: Mod assist       General transfer comment: Assist for balance  and safety.   Ambulation/Gait Ambulation/Gait assistance: Mod assist;Max assist Gait Distance (Feet): 5 Feet   Gait Pattern/deviations: Shuffle;Narrow base of support   Gait velocity interpretation: <1.8 ft/sec, indicate of risk for recurrent falls General Gait Details: pt does not ambulate at baseline as per own reporting///she states she tends to generally stay in bed. Does walk "a little" with cane or walker when nurse/aide is available in the home. Alone in home frequently when daughter is at work   [employer]ine [employer] [place] (Stroke Patients Only)       Balance Overall balance assessment: Needs assistance Sitting-balance support: Feet supported;Single extremity supported Sitting balance-Leahy Scale: Fair Sitting balance - Comments: able to maintain sitting edge of bed with RUE to hold on Postural control: Left lateral lean Standing balance support: Single extremity supported;During functional activity Standing balance-Leahy Scale: Poor                              Cognition Arousal/Alertness: Awake/alert Behavior During Therapy: WFL for tasks assessed/performed;Flat affect Overall Cognitive Status: No family/caregiver present to determine baseline cognitive functioning                                 General Comments: Increased time to process instructions and cues.      Exercises General Exercises - Lower Extremity Ankle Circles/Pumps: AROM;AAROM;Seated Short  Arc Quad: AROM;Seated Hip ABduction/ADduction: AROM;Seated Hip Flexion/Marching: AROM;Seated Toe Raises: AROM;Seated Other Exercises Other Exercises: Educated on LE ex with good return demo Other Exercises: Reminded about proper breathing techniques Other Exercises: Practiced IS, and Flutter valve unit with good return demo.    General Comments General comments (skin integrity, edema, etc.): Patient able to maintain 90-93%+ throughout  session today.      Pertinent Vitals/Pain Pain Assessment: No/denies pain    Home Living                      Prior Function            PT Goals (current goals can now be found in the care plan section) Acute Rehab PT Goals Patient Stated Goal: To go home PT Goal Formulation: With patient Time For Goal Achievement: 04/19/19 Potential to Achieve Goals: Fair Progress towards PT goals: Progressing toward goals(very slowly progressing but would not be safe at home unless there was 24/7 supervision. Could benefit from [facility] in SNF)    Frequency    Min 3X/week      PT Plan      Co-evaluation   Reason for Co-Treatment: For patient/therapist safety;To address functional/ADL transfers   OT goals addressed during session: Strengthening/ROM      AM-PAC PT "6 Clicks" Mobility   Outcome Measure      Help needed moving to and from a bed to a chair (including a wheelchair)?: A Lot Help needed standing up from a chair using your arms (e.g., wheelchair or bedside chair)?: A Lot Help needed to walk in hospital room?: A Lot Help needed climbing 3-5 steps with a railing? : A Lot 6 Click Score: 8    End of Session   Activity Tolerance: Patient limited by fatigue Patient left: in chair;with call bell/phone within reach;with chair alarm set Nurse Communication: Mobility status PT Visit Diagnosis: Unsteadiness on feet (R26.81);Other abnormalities of gait and mobility (R26.89);Muscle weakness (generalized) (M62.81)     Time: [id number] PT Time Calculation (min) (ACUTE ONLY): 36 min  Charges:  $Therapeutic Exercise: 23-37 mins                   Rollen Sox, PT # [phone] CGV cell   Casandra Doffing 04/07/2019, 4:33 PM                            Casandra Doffing 04/07/2019, 4:33 PM

## 2019-04-07 NOTE — Progress Notes (Addendum)
PROGRESS NOTE    Michelle Lopez  NUU:[id number] DOB: Dec 16, 1945 DOA: 04/03/2019 PCP: Janith Lima, MD   Brief Narrative:  Michelle Lopez is Michelle Lopez 74 y.o. female with medical history significant of CVA with residual left-sided weakness, IDDM, hypertension and hyperlipidemia presented with dry cough, pleuritic chest pain, and increasing short of breath for 3 days.   Her symptoms started 3 days ago, with generalized weakness and aching.  Gradually she developed short of breath and dry cough.  Denies loss of taste, nausea vomiting or diarrhea, denies sore throat runny nose, no fever but episodes of chills.  She lives with her daughter, and she denied any known COVID-19 contact. ED Course: COVID-19 positive, tachypneic, borderline hypoxia, stabilized with 2 L oxygen.  Assessment & Plan:   Active Problems:   COVID-19 virus infection   COVID-19   Acute respiratory failure with hypoxia ([place])  COVID-19 infection with impending respiratory failure CXR 2/28 without acute disease On RA, O2 sats on lower side in the low 90's Steroids/remdesivir started on admission in setting of her tachypnea (will d/c steroids with no o2 requirement or CXR findings) I/O, daily weights IS, OOB, prone as able Follow LE [place] (negative for DVT)  COVID-19 Labs  Recent Labs    04/05/19 0401 04/06/19 0323 04/07/19 0354  DDIMER 1.57* 1.14* 0.93*  FERRITIN 157 146 159  CRP 3.7* 2.6* 1.6*    No results found for: SARSCOV2NAA  Fever: likely 2/2 above, follow cultures - blood cx NGTD x 4 (urine cx with multiple species)  AKI Baseline 12/2018 was ~1.11 improving Follow with IVF Renal US with mildly enlarged kidneys suggesting ATN vs interstitial nephritis (UA unimpressive on presentation - suspect ATN from hypovolemia, poor intake more likely) Follow UA (negative protein, 0-5 RBC), renal US (pending).  CK mildly elevated.   suspect dehydration from poor oral intake Hold thiazide and ARB  Hyperkalemia:  resolved  T2DM  Hypoglycemia SSI + lantus - decrease lantus with hypoglycemia this AM  Hypertension Controlled, continue home meds (amlodipine and metoprolol).  Hold thiazide and arb.  History of CVA Left-sided weakness chronic Aspirin, plavix and statin  DVT prophylaxis: heparin Code Status: full  Family Communication: none at bedside - discussed with daughter Rosaria Ferries 3/1 Disposition Plan:  . Patient came from: home            . Anticipated d/c place: SNF . Barriers to d/c OR conditions which need to be met to effect [facility] safe d/c: pending SNF placement  Consultants:   none  Procedures:  none  Antimicrobials:  Anti-infectives (From admission, onward)   Start     Dose/Rate Route Frequency Ordered Stop   04/04/19 1000  remdesivir 100 mg in sodium chloride 0.9 % 100 mL IVPB     100 mg 200 mL/hr over 30 Minutes Intravenous Daily 04/03/19 1252 04/07/19 0819   04/03/19 1400  remdesivir 200 mg in sodium chloride 0.9% 250 mL IVPB     200 mg 580 mL/hr over 30 Minutes Intravenous Once 04/03/19 1252 04/03/19 1900     Subjective: No new complaints Disappointment with need for SNF  Objective: Vitals:   04/06/19 1711 04/06/19 2005 04/07/19 0404 04/07/19 0725  BP: 116/61 118/69 (!) 113/59 (!) 119/50  Pulse: 72 73 71 71  Resp: 16 (!) 23 18 (!) 22  Temp: 98.7 F (37.1 C) 98 F (36.7 C) 98.2 F (36.8 C) 98.2 F (36.8 C)  TempSrc: Oral Oral Oral Oral  SpO2: 91% 94% 92% 93%  Weight:  Height:        Intake/Output Summary (Last 24 hours) at 04/07/2019 1012 Last data filed at 04/06/2019 1023 Gross per 24 hour  Intake 120 ml  Output --  Net 120 ml   Filed Weights   04/03/19 1147  Weight: 70.3 kg    Examination:  General: No acute distress. Cardiovascular: Heart sounds show Michelle Lopez regular rate, and rhythm.  Lungs: Clear to auscultation bilaterally . Abdomen: Soft, nontender, nondistended  Neurological: Alert and oriented 3. Moves all extremities 4 . Cranial nerves  II through XII grossly intact. Skin: Warm and dry. No rashes or lesions. Extremities: No clubbing or cyanosis. No edema.   Data Reviewed: I have personally reviewed following labs and imaging studies  CBC: Recent Labs  Lab 04/03/19 1048 04/04/19 0342 04/05/19 0401 04/06/19 0323 04/07/19 0354  WBC 5.1 3.2* 4.1 5.3 6.0  NEUTROABS 3.2 2.4 1.8 2.6 2.4  HGB 14.4 13.2 13.2 13.8 12.9  HCT 47.3* 42.9 41.7 43.5 40.6  MCV 84.6 84.6 81.9 83.5 82.5  PLT 162 151 154 167 842   Basic Metabolic Panel: Recent Labs  Lab 04/03/19 1048 04/03/19 1048 04/04/19 0342 04/04/19 0821 04/05/19 0401 04/06/19 0323 04/07/19 0354  NA 140  --  136  --  139 139 139  K 4.0   < > 6.1* 4.1 4.1 4.5 3.8  CL 106  --  104  --  105 105 107  CO2 22  --  22  --  _0 GLUCOSE 159*  --  258*  --  137* 227* 87  BUN 49*  --  35*  --  39* 35* 35*  CREATININE 2.15*  --  1.51*  --  1.29* 1.17* 1.04*  CALCIUM 9.1  --  8.3*  --  8.4* 8.3* 8.0*  MG  --   --  1.7  --  1.6* 1.7 1.6*  PHOS  --   --  2.1*  --  2.7 2.8 2.6   < > = values in this interval not displayed.   GFR: Estimated Creatinine Clearance: 43.3 mL/min (Michelle Lopez) (by C-G formula based on SCr of 1.04 mg/dL (H)). Liver Function Tests: Recent Labs  Lab 04/03/19 1048 04/04/19 0342 04/05/19 0401 04/06/19 0323 04/07/19 0354  AST 36 50* _1 ALT _2 ALKPHOS 61 45 48 49 49  BILITOT 0.8 1.4* 0.4 0.3 0.5  PROT 8.1 7.0 6.9 6.7 6.3*  ALBUMIN 3.7 3.2* 3.1* 3.0* 2.7*   No results for input(s): LIPASE, AMYLASE in the last 168 hours. No results for input(s): AMMONIA in the last 168 hours. Coagulation Profile: Recent Labs  Lab 04/03/19 1048  INR 0.9   Cardiac Enzymes: Recent Labs  Lab 04/03/19 1048  CKTOTAL 327*   BNP (last 3 results) No results for input(s): PROBNP in the last 8760 hours. HbA1C: No results for input(s): HGBA1C in the last 72 hours. CBG: Recent Labs  Lab 04/06/19 1714 04/06/19 2007 04/06/19 2107 04/07/19 0724  04/07/19 0815  GLUCAP 146* 218* 190* 62* 130*   Lipid Profile: No results for input(s): CHOL, HDL, LDLCALC, TRIG, CHOLHDL, LDLDIRECT in the last 72 hours. Thyroid Function Tests: No results for input(s): TSH, T4TOTAL, FREET4, T3FREE, THYROIDAB in the last 72 hours. Anemia Panel: Recent Labs    04/06/19 0323 04/07/19 0354  FERRITIN 146 159   Sepsis Labs: Recent Labs  Lab 04/03/19 1048 04/03/19 1828 04/04/19 0342  LATICACIDVEN 1.2 2.9* 2.0*    Recent Results (  from the past 240 hour(s))  Blood Culture (routine x 2)     Status: None (Preliminary result)   Collection Time: 04/03/19 12:15 PM   Specimen: BLOOD  Result Value Ref Range Status   Specimen Description BLOOD SITE NOT SPECIFIED  Final   Special Requests   Final    BOTTLES DRAWN AEROBIC AND ANAEROBIC Blood Culture results may not be optimal due to an inadequate volume of blood received in culture bottles   Culture   Final    NO GROWTH 4 DAYS Performed at [facility], [place] [address], [place], [place] [address]    Report Status PENDING  Incomplete  Blood Culture (routine x 2)     Status: None (Preliminary result)   Collection Time: 04/03/19 12:37 PM   Specimen: BLOOD  Result Value Ref Range Status   Specimen Description BLOOD LEFT ANTECUBITAL  Final   Special Requests   Final    BOTTLES DRAWN AEROBIC ONLY Blood Culture results may not be optimal due to an inadequate volume of blood received in culture bottles   Culture   Final    NO GROWTH 4 DAYS Performed at [facility], [place] [address], [place], [place] [address]    Report Status PENDING  Incomplete  Urine culture     Status: Abnormal   Collection Time: 04/03/19  6:00 PM   Specimen: In/Out Cath Urine  Result Value Ref Range Status   Specimen Description   Final    IN/OUT CATH URINE Performed at [place] [address] [place], [place] [address]    Special Requests   Final    NONE Performed at [facility], [place] [address], [place], [place] [address]    Culture MULTIPLE SPECIES PRESENT, SUGGEST RECOLLECTION (Michelle Lopez)  Final   Report Status 04/04/2019 FINAL  Final         Radiology Studies: DG CHEST PORT 1 VIEW  Result Date: 04/06/2019 CLINICAL DATA:  Hypoxia EXAM: PORTABLE CHEST 1 VIEW COMPARISON:  04/04/2019 FINDINGS: Heart size and vascularity normal. Negative for heart failure. Improved aeration right lung base. Lung bases now clear without infiltrate or effusion. Atherosclerotic aorta. IMPRESSION: No active disease. Electronically Signed   By: Franchot Gallo M.D.   On: 04/06/2019 09:40        Scheduled Meds: . amLODipine  5 mg Oral Daily  . vitamin C  500 mg Oral Daily  . aspirin  81 mg Oral Daily  . atorvastatin  20 mg Oral Daily  . clopidogrel  75 mg Oral Daily  . docusate sodium  100 mg Oral BID  . feeding supplement (GLUCERNA SHAKE)  237 mL Oral TID BM  . heparin  5,000 Units Subcutaneous Q8H  . insulin aspart  0-20 Units Subcutaneous TID WC  . insulin aspart  0-5 Units Subcutaneous QHS  . [START ON 04/08/2019] insulin glargine  22 Units Subcutaneous Daily  . ipratropium  2 puff Inhalation Q6H  . loratadine  10 mg Oral Daily  . metoprolol tartrate  12.5 mg Oral BID  . tamsulosin  0.4 mg Oral Daily  . Vitamin D (Ergocalciferol)  50,000 Units Oral Q7 days  . zinc sulfate  220 mg Oral Daily   Continuous Infusions:    LOS: 4 days    Time spent: over 30 min     Fayrene Helper, MD [facility]   To contact the attending provider between 7A-7P or the covering provider during after hours 7P-7A,  please log into the web site [email or web address] and access using universal [place] password for that web site. If you do not have the password, please call the hospital operator.  04/07/2019, 10:12 AM

## 2019-04-08 ENCOUNTER — Telehealth: Payer: Self-pay | Admitting: *Deleted

## 2019-04-08 DIAGNOSIS — E1121 Type 2 diabetes mellitus with diabetic nephropathy: Secondary | ICD-10-CM | POA: Diagnosis not present

## 2019-04-08 DIAGNOSIS — R2681 Unsteadiness on feet: Secondary | ICD-10-CM | POA: Diagnosis not present

## 2019-04-08 DIAGNOSIS — Z7401 Bed confinement status: Secondary | ICD-10-CM | POA: Diagnosis not present

## 2019-04-08 DIAGNOSIS — R498 Other voice and resonance disorders: Secondary | ICD-10-CM | POA: Diagnosis not present

## 2019-04-08 DIAGNOSIS — Z794 Long term (current) use of insulin: Secondary | ICD-10-CM | POA: Diagnosis not present

## 2019-04-08 DIAGNOSIS — E11649 Type 2 diabetes mellitus with hypoglycemia without coma: Secondary | ICD-10-CM | POA: Diagnosis not present

## 2019-04-08 DIAGNOSIS — R531 Weakness: Secondary | ICD-10-CM | POA: Diagnosis not present

## 2019-04-08 DIAGNOSIS — N170 Acute kidney failure with tubular necrosis: Secondary | ICD-10-CM | POA: Diagnosis not present

## 2019-04-08 DIAGNOSIS — N179 Acute kidney failure, unspecified: Secondary | ICD-10-CM | POA: Diagnosis not present

## 2019-04-08 DIAGNOSIS — R338 Other retention of urine: Secondary | ICD-10-CM | POA: Diagnosis not present

## 2019-04-08 DIAGNOSIS — R0602 Shortness of breath: Secondary | ICD-10-CM | POA: Diagnosis not present

## 2019-04-08 DIAGNOSIS — U071 COVID-19: Secondary | ICD-10-CM | POA: Diagnosis not present

## 2019-04-08 DIAGNOSIS — J9601 Acute respiratory failure with hypoxia: Secondary | ICD-10-CM | POA: Diagnosis not present

## 2019-04-08 DIAGNOSIS — G8194 Hemiplegia, unspecified affecting left nondominant side: Secondary | ICD-10-CM | POA: Diagnosis not present

## 2019-04-08 DIAGNOSIS — R1312 Dysphagia, oropharyngeal phase: Secondary | ICD-10-CM | POA: Diagnosis not present

## 2019-04-08 DIAGNOSIS — E876 Hypokalemia: Secondary | ICD-10-CM | POA: Diagnosis not present

## 2019-04-08 DIAGNOSIS — M2550 Pain in unspecified joint: Secondary | ICD-10-CM | POA: Diagnosis not present

## 2019-04-08 DIAGNOSIS — M6281 Muscle weakness (generalized): Secondary | ICD-10-CM | POA: Diagnosis not present

## 2019-04-08 DIAGNOSIS — I10 Essential (primary) hypertension: Secondary | ICD-10-CM | POA: Diagnosis not present

## 2019-04-08 DIAGNOSIS — R41841 Cognitive communication deficit: Secondary | ICD-10-CM | POA: Diagnosis not present

## 2019-04-08 LAB — COMPREHENSIVE METABOLIC PANEL
ALT: 21 U/L (ref 0–44)
AST: 29 U/L (ref 15–41)
Albumin: 2.7 g/dL — ABNORMAL LOW (ref 3.5–5.0)
Alkaline Phosphatase: 53 U/L (ref 38–126)
Anion gap: 11 (ref 5–15)
BUN: 29 mg/dL — ABNORMAL HIGH (ref 8–23)
CO2: 24 mmol/L (ref 22–32)
Calcium: 8.2 mg/dL — ABNORMAL LOW (ref 8.9–10.3)
Chloride: 102 mmol/L (ref 98–111)
Creatinine, Ser: 1.16 mg/dL — ABNORMAL HIGH (ref 0.44–1.00)
GFR calc Af Amer: 53 mL/min — ABNORMAL LOW (ref >60)
GFR calc non Af Amer: 46 mL/min — ABNORMAL LOW (ref >60)
Glucose, Bld: 62 mg/dL — ABNORMAL LOW (ref 70–99)
Potassium: 3.3 mmol/L — ABNORMAL LOW (ref 3.5–5.1)
Sodium: 137 mmol/L (ref 135–145)
Total Bilirubin: 0.5 mg/dL (ref 0.3–1.2)
Total Protein: 6.3 g/dL — ABNORMAL LOW (ref 6.5–8.1)

## 2019-04-08 LAB — C-REACTIVE PROTEIN: CRP: 1.4 mg/dL — ABNORMAL HIGH (ref <1.0)

## 2019-04-08 LAB — CULTURE, BLOOD (ROUTINE X 2)
Culture: NO GROWTH
Culture: NO GROWTH

## 2019-04-08 LAB — CBC WITH DIFFERENTIAL/PLATELET
Abs Immature Granulocytes: 0.01 10*3/uL (ref 0.00–0.07)
Basophils Absolute: 0 10*3/uL (ref 0.0–0.1)
Basophils Relative: 1 %
Eosinophils Absolute: 0 10*3/uL (ref 0.0–0.5)
Eosinophils Relative: 0 %
HCT: 42 % (ref 36.0–46.0)
Hemoglobin: 13.4 g/dL (ref 12.0–15.0)
Immature Granulocytes: 0 %
Lymphocytes Relative: 65 %
Lymphs Abs: 5.2 10*3/uL — ABNORMAL HIGH (ref 0.7–4.0)
MCH: 26.1 pg (ref 26.0–34.0)
MCHC: 31.9 g/dL (ref 30.0–36.0)
MCV: 81.7 fL (ref 80.0–100.0)
Monocytes Absolute: 0.8 10*3/uL (ref 0.1–1.0)
Monocytes Relative: 10 %
Neutro Abs: 1.9 10*3/uL (ref 1.7–7.7)
Neutrophils Relative %: 24 %
Platelets: UNDETERMINED 10*3/uL (ref 150–400)
RBC: 5.14 MIL/uL — ABNORMAL HIGH (ref 3.87–5.11)
RDW: 12.9 % (ref 11.5–15.5)
WBC: 8 10*3/uL (ref 4.0–10.5)
nRBC: 0 % (ref 0.0–0.2)

## 2019-04-08 LAB — GLUCOSE, CAPILLARY
Glucose-Capillary: 64 mg/dL — ABNORMAL LOW (ref 70–99)
Glucose-Capillary: 97 mg/dL (ref 70–99)
Glucose-Capillary: 86 mg/dL (ref 70–99)
Glucose-Capillary: 63 mg/dL — ABNORMAL LOW (ref 70–99)
Glucose-Capillary: 167 mg/dL — ABNORMAL HIGH (ref 70–99)
Glucose-Capillary: 148 mg/dL — ABNORMAL HIGH (ref 70–99)

## 2019-04-08 LAB — D-DIMER, QUANTITATIVE: D-Dimer, Quant: 0.98 ug/mL-FEU — ABNORMAL HIGH (ref 0.00–0.50)

## 2019-04-08 LAB — FERRITIN: Ferritin: 133 ng/mL (ref 11–307)

## 2019-04-08 LAB — PHOSPHORUS: Phosphorus: 2.5 mg/dL (ref 2.5–4.6)

## 2019-04-08 LAB — MAGNESIUM: Magnesium: 1.9 mg/dL (ref 1.7–2.4)

## 2019-04-08 MED ORDER — INSULIN GLARGINE 100 UNIT/ML ~~LOC~~ SOLN
20.0000 [IU] | Freq: Every day | SUBCUTANEOUS | Status: DC
  Administered 2019-04-08: 20 [IU] via SUBCUTANEOUS
  Filled 2019-04-08: qty 0.2

## 2019-04-08 MED ORDER — TOUJEO MAX SOLOSTAR 300 UNIT/ML ~~LOC~~ SOPN: 20.0000 [IU] | PEN_INJECTOR | Freq: Every day | SUBCUTANEOUS | 1 refills | Status: DC

## 2019-04-08 MED ORDER — INSULIN ASPART 100 UNIT/ML ~~LOC~~ SOLN: 0.0000 [IU] | Freq: Three times a day (TID) | SUBCUTANEOUS | Status: DC

## 2019-04-08 MED ORDER — INSULIN ASPART 100 UNIT/ML ~~LOC~~ SOLN: 0.0000 [IU] | Freq: Three times a day (TID) | SUBCUTANEOUS | 0 refills | Status: DC

## 2019-04-08 MED ORDER — POTASSIUM CHLORIDE CRYS ER 20 MEQ PO TBCR
40.0000 meq | EXTENDED_RELEASE_TABLET | Freq: Once | ORAL | Status: AC
  Administered 2019-04-08: 40 meq via ORAL
  Filled 2019-04-08: qty 2

## 2019-04-08 NOTE — TOC Transition Note (Addendum)
Transition of Care [facility]) - CM/SW Discharge Note   Patient Details  Name: Michelle Lopez MRN: [id number] Date of Birth: 24-Feb-1945  [facility] CM/SW Contact:  Atilano Median, LCSW Phone Number: 04/08/2019, 10:32 AM   Clinical Narrative:     Discharged to [facility]. Referral coordinated with [facility]. Patient's daughter Michelle Lopez aware and agreeable to this plan. Number to call report [phone] given to unit RN Jenny Reichmann. DC summary sent via Epic to inbasket, No other needs at this time. Case closed to this CSW.   Final next level of care: Skilled Nursing Facility Barriers to Discharge: Barriers Resolved   Patient Goals and CMS Choice   CMS Medicare.gov Compare Post Acute Care list provided to:: Patient Represenative (must comment)([place]) Choice offered to / list presented to : Adult Children  Discharge Placement   Existing PASRR number confirmed : 04/08/19          Patient chooses bed at: [facility] Patient to be transferred to facility by: [place] Name of family member notified: Michelle Lopez Patient and family notified of of transfer: 04/08/19  Discharge Plan and Services                                     Social Determinants of Health (SDOH) Interventions     Readmission Risk Interventions No flowsheet data found.

## 2019-04-08 NOTE — Care Management Important Message (Signed)
Important Message  Patient Details  Name: Michelle Lopez MRN: [id number] Date of Birth: [birth year]   Medicare Important Message Given:  Yes - Important Message mailed due to current National Emergency  Verbal consent obtained due to current National Emergency  Relationship to patient: Child Contact Name: [name] Call Date: 04/08/19  Time: 1011 Phone: [phone] Outcome: No Answer/Busy Important Message mailed to: Patient address on file    Delorse Lek 04/08/2019, 10:11 AM

## 2019-04-08 NOTE — Discharge Summary (Signed)
**Note De-Identified vi Obfusction** Physicin Dischrge Summry  [name] TLX:[id number] DOB: [birth year] DOA: 04/03/2019  PCP: Jnith Lim, MD  Admit dte: 04/03/2019 Dischrge dte: 04/08/2019  Time spent: 40 minutes  Recommendtions for Outptient Follow-up:  1. Follow outptient CBC/CMP 2. Qurntine per CDC guidelines - until 04/24/19 3. Follow blood pressure, thizide nd rb were discontinued 4. Follow blood sugrs, some hypoglycemi here on 3/1 nd 3/2 AM, lntus decresed nd meltime insulin discontinued - follow BG's t SNF nd djust s needed 5. Sliding scle insulin dded on -> see sliding scle below    Sliding scle For blood sugr less thn 70 hypoglycemi protocol For blood sugr 70-120 give 0 units sprt For blood sugr 121-150 give 1 unit sprt For blood sugr 151-200 give 2 units sprt For blood sugr 201-250 give 3 units sprt For blood sugr 251-300 give 5 units sprt For blood sugr 301-350 give 7 units sprt For blood sugr 351-400 give 9 units sprt For blood sugr greter thn 400 cll MD  Dischrge Dignoses:  Active Problems:   COVID-19 virus infection   COVID-19   Acute respirtory filure with hypoxi [facility])  Dischrge Condition: stble  Diet recommendtion: dibetic  Filed Weights   04/03/19 1147  Weight: 70.3 kg    History of present illness:  Hiilei Lopez  74 y.o.femlewith medicl history significnt ofCVA withresidulleft-sided wekness,IDDM, hypertension nd hyperlipidemi presentedwith drycough,pleuritic chest pin,nd incresing short of breth for 3 dys.Her symptoms strted 3 dys go, with generlized wekness nd ching.Grdully she developed short of breth nd dry cough. Deniesloss of tste,nuse vomiting or dirrhe, denies sore throt runny nose, no fever butepisodes of chills.She lives with her dughter, nd she denied ny known COVID-19 contct. ED Course:COVID-19 positive, tchypneic, borderline hypoxi, stbilized with 2  L oxygen.  She ws dmitted for COVID 19 infection.  Chest x ry ws without significnt findings, but ptient hd AKI nd borderline O2 requirement on presenttion.  She's improved with steroids nd remdesivir nd is now close to bseline.  Pln is for dischrge to SNF for rehb on 3/2.    Hospitl Course:  COVID-19 infection with impending respirtory filure CXR 2/28 without cute disese On RA, O2 sts on lower side in the low 90's Completed course of remdesivir.  Steroids now discontinued. I/O, dily weights IS, OOB, prone s ble Follow LE Kore (negtive for DVT) Qurntine for 21 dys since positive test.  COVID-19 Lbs  Recent Lbs    04/06/19 0323 04/07/19 0354 04/08/19 0400  DDIMER 1.14* 0.93* 0.98*  FERRITIN 146 159 133  CRP 2.6* 1.6* 1.4*    No results found for: SARSCOV2NAA  Fever: likely 2/2 bove, follow cultures - blood cx NGTD x 5 (urine cx with multiple species)  AKI Bseline 12/2018 ws ~1.11 improved Follow with IVF Renl Kore with mildly enlrged kidneys suggesting ATN vs interstitil nephritis (UA unimpressive on presenttion - suspect ATN from hypovolemi, poor intke more likely) Follow UA (negtive protein, 0-5 RBC), renl Kore (pending). CK mildly elevted.  suspect dehydrtion from poor orl intke Hold thizide nd ARB  Hyperklemi: resolved  T2DM  Hypoglycemi Hypoglycemi on prior lntus dose - lntus decresed to 20 units -> follow dditionl BG's for djustment Meltime insulin discontinued Sliding scle insulin strted - See scle below  Sliding scle For blood sugr less thn 70 hypoglycemi protocol For blood sugr 70-120 give 0 units sprt For blood sugr 121-150 give 1 unit sprt For blood sugr 151-200 give 2 units sprt For blood sugr 201-250 give 3 units sprt **Note De-Identified vi Obfusction** For blood sugr 251-300 give 5 units sprt For blood sugr 301-350 give 7 units sprt For blood sugr 351-400 give 9 units sprt For blood sugr greter  thn 400 cll MD  Hypertension Controlled, continue home meds (mlodipine nd metoprolol).Hold thizide nd rb.  Follow for need outptient  History of CVA Left-sided wekness chronic Aspirin, plvix nd sttin  Procedures: LE Kore Summry:  BILATERAL:  - No evidence of deep vein thrombosis seen in the lower extremities,  bilterlly.   Consulttions:  none  Dischrge Exm: Vitls:   04/08/19 0413 04/08/19 0757  BP: (!) 111/58 (!) 111/58  Pulse: 77 78  Resp: 20 20  Temp: 98.5 F (36.9 C) 99.1 F (37.3 C)  SpO2: 98% 91%   Feels well No new complints Clled dughter [place] x2, unble to rech  Generl: No cute distress. Crdiovsculr: Hert sounds show  regulr rte, nd rhythm. Lungs: Cler to usculttion bilterlly  Abdomen: Soft, nontender, nondistended Neurologicl: Alert nd oriented 3. Moves ll extremities 4 . Crnil nerves II through XII grossly intct. Skin: Wrm nd dry. No rshes or lesions. Extremities: No clubbing or cynosis. No edem.  Dischrge Instructions   Dischrge Instructions    Cll MD for:  difficulty brething, hedche or visul disturbnces   Complete by: As directed    Cll MD for:  extreme ftigue   Complete by: As directed    Cll MD for:  persistnt dizziness or light-hededness   Complete by: As directed    Cll MD for:  persistnt nuse nd vomiting   Complete by: As directed    Cll MD for:  redness, tenderness, or signs of infection (pin, swelling, redness, odor or green/yellow dischrge round incision site)   Complete by: As directed    Cll MD for:  severe uncontrolled pin   Complete by: As directed    Cll MD for:  temperture >100.4   Complete by: As directed    Diet Crb Modified   Complete by: As directed    Dischrge instructions   Complete by: As directed    You were seen for  COVID 19 infection.  You've improved with steroids nd remdesivir.  You should continue to qurntine until Mrch  18th.    We're plnning to send you to  skilled nursing fcility so you cn get stronger before going home.  We've stopped some of your blood pressure medicines (the HCTZ nd the losrtn combintion).  Plese follow your blood pressure outptient closely.  We've djusted your insulin regimen for low blood sugrs.  We'll send you out with sliding scle insulin.  Plese follow up with your PCP for djustments to your regimen.  Your sliding scle regimen is s follows: Blood sugr less thn 70 hypoglycemi protocol Blood sugr 70-120 give 0 units sprt Blood sugr 121-150 give 1 unit sprt Blood sugr 151-200 give 2 units sprt Blood sugr 201-250 give 3 units sprt Blood sugr 251-300 give 5 units sprt Blood sugr 301-350 give 7 units sprt Blood sugr 351-400 give 9 units sprt Blood sugr greter thn 400 cll MD  Return for new, recurrent, or worsening symptoms.  Plese sk your PCP to request records from this hospitliztion so they know wht ws done nd wht the next steps will be.   Increse ctivity slowly   Complete by: As directed    Increse ctivity slowly   Complete by: As directed      Allergies s of 04/08/2019      Rections **Note De-Identified vi Obfusction** Crestor [rosuvsttin Clcium]    mylgis   Morphine Hives   Prvsttin Sodium Rsh   Simvsttin Rsh      Mediction List    STOP tking these medictions   ibuprofen 200 MG tblet Commonly known s: DVIL   insulin sprt 100 UNIT/ML FlexPen Commonly known s: NOVOLOG Replced by: insulin sprt 100 UNIT/ML injection   losrtn-hydrochlorothizide 100-12.5 MG tblet Commonly known s: HYZR     TKE these medictions   mLODipine 5 MG tblet Commonly known s: NORVSC TKE 1 TBLET BY MOUTH EVERY DY   spirin 81 MG tblet Tke 81 mg by mouth dily.   torvsttin 20 MG tblet Commonly known s: LIPITOR TKE 1 TBLET BY MOUTH EVERY DY   cetirizine 10 MG tblet Commonly known s: ZYRTEC Tke 1 tblet  (10 mg totl) by mouth dily.   clopidogrel 75 MG tblet Commonly known s: PLVIX TKE 1 TBLET BY MOUTH EVERY DY   clotrimzole-betmethsone crem Commonly known s: LOTRISONE pply 1 ppliction topiclly 2 (two) times dily.   Contour Next EZ w/Device Kit 1 ct by Does not pply route 2 (two) times dily.   Glucern Liqd Tke 1 Cn by mouth 2 (two) times dily between mels.   glucose blood test strip Commonly known s: OneTouch Verio Use to check blood sugrs twice  dy   insulin sprt 100 UNIT/ML injection Commonly known s: novoLOG Inject 0-9 Units into the skin 3 (three) times dily with mels. Replces: insulin sprt 100 UNIT/ML FlexPen   metoprolol trtrte 25 MG tblet Commonly known s: LOPRESSOR TKE 1/2 TBLET TWICE  DY   NovoFine 32G X 6 MM Misc Generic drug: Insulin Pen Needle Inject 1 ct into the skin dily.   onetouch ultrsoft lncets Use to check blood sugrs twice  dy   Restsis 0.05 % ophthlmic emulsion Generic drug: cycloSPORINE Plce 1 drop into both eyes dily.   tmsulosin 0.4 MG Cps cpsule Commonly known s: FLOMX Tke 1 cpsule (0.4 mg totl) by mouth dily.   Toujeo Mx SoloStr 300 UNIT/ML Sopn Generic drug: Insulin Glrgine (2 Unit Dil) Inject 20 Units into the skin dily. Wht chnged: how much to tke   Vitmin D (Ergoclciferol) 1.25 MG (50000 UNIT) Cps cpsule Commonly known s: DRISDOL Tke 1 cpsule (50,000 Units totl) by mouth every 7 (seven) dys. TKE 1 CPSULE BY MOUTH EVERY 7 DYS      llergies  llergen Rections  . Crestor [Rosuvsttin Clcium]     mylgis  . Morphine Hives  . Prvsttin Sodium Rsh  . Simvsttin Rsh      The results of significnt dignostics from this hospitliztion (including imging, microbiology, ncillry nd lbortory) re listed below for reference.    Significnt Dignostic Studies: Kore RENL  Result Dte: 04/04/2019 CLINICL DT:  cute kidney injury.  COVID-19 infection. ltered mentl sttus. EXM: RENL / URINRY TRCT ULTRSOUND COMPLETE COMPRISON:  Renl ultrsound 06/19/2018 FINDINGS: Right Kidney: Renl mesurements: 10.1 x 6.0 x 5.2 cm = volume: 164.5 mL. There is corticl thinning with incresed echogenicity.  smll cyst is present in the interpolr region, mesuring 15 mm mximlly. No hydronephrosis. Left Kidney: Renl mesurements: 11.2 x 6.7 x 4.9 cm = volume: 194.7 mL. Mild corticl thinning. No hydronephrosis. Bldder: ppers norml for degree of bldder distention. Other: Exmintion is mildly limited by the ptient's mentl sttus nd bowel gs. IMPRESSION: 1. Both kidneys re mildly enlrged compred with the prior ultrsound, suggesting cute tubulr necrosis or interstitil nephritis. 2. No hydronephrosis **Note De-Identified vi Obfusction** or perinephric fluid collection. Electroniclly Signed   By: Richrden Sle M.D.   On: 04/04/2019 12:10   DG CHEST PORT 1 VIEW  Result Dte: 04/06/2019 CLINICL DT:  Hypoxi EXM: PORTBLE CHEST 1 VIEW COMPRISON:  04/04/2019 FINDINGS: Hert size nd vsculrity norml. Negtive for hert filure. Improved ertion right lung bse. Lung bses now cler without infiltrte or effusion. therosclerotic ort. IMPRESSION: No ctive disese. Electroniclly Signed   By: Frnchot Gllo M.D.   On: 04/06/2019 09:40   DG CHEST PORT 1 VIEW  Result Dte: 04/04/2019 CLINICL DT:  Shortness of breth. EXM: PORTBLE CHEST 1 VIEW COMPRISON:  Twenty-fifth 2021. FINDINGS: Stble crdiomedistinl silhouette. therosclerosis of thorcic ort is noted. No pneumothorx or pleurl effusion is noted. No pneumothorx or pleurl effusion is noted. IMPRESSION: No ctive disese. ortic therosclerosis (ICD10-I70.0). Electroniclly Signed   By: Mrijo Conception M.D.   On: 04/04/2019 08:48   DG Chest Port 1 View  Result Dte: 04/03/2019 CLINICL DT:  Body ches, nd sinus congestion nd cough for 3 dys. EXM: PORTBLE CHEST 1 VIEW COMPRISON:  P  nd lterl chest 02/25/2015. FINDINGS: The lung volumes re low. Lungs cler. No pneumothorx or pleurl effusion. Hert size is norml. therosclerosis. No cute or focl bony bnormlity. IMPRESSION: No cute disese. therosclerosis. Electroniclly Signed   By: Inge Rise M.D.   On: 04/03/2019 11:11   VS Kore LOWER EXTREMITY VENOUS (DVT)  Result Dte: 04/05/2019  Lower Venous DVTStudy Indictions: Elevted ddimer.  Comprison Study: no prior Performing Technologist: brm Snder RVS  Exmintion Guidelines:  complete evlution includes B-mode imging, spectrl Doppler, color Doppler, nd power Doppler s needed of ll ccessible portions of ech vessel. Bilterl testing is considered n integrl prt of  complete exmintion. Limited exmintions for reoccurring indictions my be performed s noted. The reflux portion of the exm is performed with the ptient in reverse Trendelenburg.  +---------+---------------+---------+-----------+----------+--------------+ RIGHT    CompressibilityPhsicitySpontneityPropertiesThrombus ging +---------+---------------+---------+-----------+----------+--------------+ CFV      Full           Yes      Yes                                 +---------+---------------+---------+-----------+----------+--------------+ SFJ      Full                                                        +---------+---------------+---------+-----------+----------+--------------+ FV Prox  Full                                                        +---------+---------------+---------+-----------+----------+--------------+ FV Mid   Full                                                        +---------+---------------+---------+-----------+----------+--------------+ FV DistlFull                                                        +---------+---------------+---------+-----------+----------+--------------+  PFV      Full                                                         +---------+---------------+---------+-----------+----------+--------------+ POP      Full           Yes      Yes                                 +---------+---------------+---------+-----------+----------+--------------+ PTV                                                   Not visualized +---------+---------------+---------+-----------+----------+--------------+ PERO                                                  Not visualized +---------+---------------+---------+-----------+----------+--------------+   +---------+---------------+---------+-----------+----------+--------------+ LEFT     CompressibilityPhasicitySpontaneityPropertiesThrombus Aging +---------+---------------+---------+-----------+----------+--------------+ CFV      Full           Yes      Yes                                 +---------+---------------+---------+-----------+----------+--------------+ SFJ      Full                                                        +---------+---------------+---------+-----------+----------+--------------+ FV Prox  Full                                                        +---------+---------------+---------+-----------+----------+--------------+ FV Mid   Full                                                        +---------+---------------+---------+-----------+----------+--------------+ FV DistalFull                                                        +---------+---------------+---------+-----------+----------+--------------+ PFV      Full                                                        +---------+---------------+---------+-----------+----------+--------------+  POP      Full           Yes      Yes                                 +---------+---------------+---------+-----------+----------+--------------+ PTV      Full                                                         +---------+---------------+---------+-----------+----------+--------------+ PERO     Full                                                        +---------+---------------+---------+-----------+----------+--------------+     Summary: BILTERL: - No evidence of deep vein thrombosis seen in the lower extremities, bilaterally.   *See table(s) above for measurements and observations. Electronically signed by Servando Snare MD on 04/05/2019 at 3:58:35 PM.    Final     Microbiology: Recent Results (from the past 240 hour(s))  Blood Culture (routine x 2)     Status: None   Collection Time: 04/03/19 12:15 PM   Specimen: BLOOD  Result Value Ref Range Status   Specimen Description BLOOD SITE NOT SPECIFIED  Final   Special Requests   Final    BOTTLES DRWN EROBIC ND NEROBIC Blood Culture results may not be optimal due to an inadequate volume of blood received in culture bottles   Culture   Final    NO GROWTH 5 DYS Performed at [facility], [place] [address] [place], [place] [address]    Report Status 04/08/2019 FINL  Final  Blood Culture (routine x 2)     Status: None   Collection Time: 04/03/19 12:37 PM   Specimen: BLOOD  Result Value Ref Range Status   Specimen Description BLOOD LEFT NTECUBITL  Final   Special Requests   Final    BOTTLES DRWN EROBIC ONLY Blood Culture results may not be optimal due to an inadequate volume of blood received in culture bottles   Culture   Final    NO GROWTH 5 DYS Performed at [facility], [place] [address] [place], [place] [address]    Report Status 04/08/2019 FINL  Final  Urine culture     Status: bnormal   Collection Time: 04/03/19  6:00 PM   Specimen: In/Out Cath Urine  Result Value Ref Range Status   Specimen Description   Final    IN/OUT CTH URINE Performed at [place] [facility]  [address] [place], [place] [address]    Special Requests   Final    NONE Performed at [facility], [place]  [address], [place], [place] [address]    Culture MULTIPLE SPECIES PRESENT, SUGGEST RECOLLECTION ()  Final   Report Status 04/04/2019 FINL  Final     Labs: Basic Metabolic Panel: Recent Labs  Lab 04/04/19 0342 04/04/19 0342 04/04/19 0821 04/05/19 0401 04/06/19 0323 04/07/19 0354 04/08/19 0400  N 136  --   --  139 139 139 137  K 6.1*   < > 4.1 4.1 4.5  3.8 3.3*  CL 104  --   --  105 105 107 102  CO2 22  --   --  '24 24 24 24  ' GLUCOSE 258*  --   --  137* 227* 87 62*  BUN 35*  --   --  39* 35* 35* 29*  CREATININE 1.51*  --   --  1.29* 1.17* 1.04* 1.16*  CALCIUM 8.3*  --   --  8.4* 8.3* 8.0* 8.2*  MG 1.7  --   --  1.6* 1.7 1.6* 1.9  PHOS 2.1*  --   --  2.7 2.8 2.6 2.5   < > = values in this interval not displayed.   Liver Function Tests: Recent Labs  Lab 04/04/19 0342 04/05/19 0401 04/06/19 0323 04/07/19 0354 04/08/19 0400  AST 50* '28 27 21 29  ' ALT '21 15 15 15 21  ' ALKPHOS 45 48 49 49 53  BILITOT 1.4* 0.4 0.3 0.5 0.5  PROT 7.0 6.9 6.7 6.3* 6.3*  ALBUMIN 3.2* 3.1* 3.0* 2.7* 2.7*   No results for input(s): LIPASE, AMYLASE in the last 168 hours. No results for input(s): AMMONIA in the last 168 hours. CBC: Recent Labs  Lab 04/04/19 0342 04/05/19 0401 04/06/19 0323 04/07/19 0354 04/08/19 0400  WBC 3.2* 4.1 5.3 6.0 8.0  NEUTROABS 2.4 1.8 2.6 2.4 1.9  HGB 13.2 13.2 13.8 12.9 13.4  HCT 42.9 41.7 43.5 40.6 42.0  MCV 84.6 81.9 83.5 82.5 81.7  PLT 151 154 167 191 PLATELET CLUMPS NOTED ON SMEAR, UNABLE TO ESTIMATE   Cardiac Enzymes: Recent Labs  Lab 04/03/19 1048  CKTOTAL 327*   BNP: BNP (last 3 results) No results for input(s): BNP in the last 8760 hours.  ProBNP (last 3 results) No results for input(s): PROBNP in the last 8760 hours.  CBG: Recent Labs  Lab 04/08/19 0417 04/08/19 0446 04/08/19 0650 04/08/19 0730 04/08/19 0920  GLUCAP 64* 97 86 63* 167*       Signed:  Fayrene Helper MD.  [facility] 04/08/2019, 9:57 AM

## 2019-04-08 NOTE — Progress Notes (Signed)
Spoke with Theadora Rama H at facility. Provided another report to RN

## 2019-04-08 NOTE — Telephone Encounter (Signed)
Pt was on TCM report admitted 04/03/19 for drycough,pleuritic chest pain,and increasing short of breath for 3 days. Pt tested Positive for Covid, tachypneic, borderline hypoxia, stabilized with 2 L oxygen. Pt improved with steroids and remdesivir and is now close to baseline.  Pt discharge 3/2/2/to SNF for rehab. Per summary will need to follow-up w/PCP 1-2 wks after leaving SNF.Marland KitchenJohny Chess

## 2019-04-08 NOTE — Progress Notes (Signed)
This RN attempted to call Report to [facility] place x4 times. Spoke with Opal Sidles, receptionist. She took my name, and number. She stated she would have someone call me back.

## 2019-04-08 NOTE — Progress Notes (Signed)
Rosette Reveal called me from [place] place to receive report. Report given, all questions answered.

## 2019-04-09 ENCOUNTER — Other Ambulatory Visit: Payer: Self-pay | Admitting: Internal Medicine

## 2019-04-09 DIAGNOSIS — E1121 Type 2 diabetes mellitus with diabetic nephropathy: Secondary | ICD-10-CM

## 2019-04-10 DIAGNOSIS — U071 COVID-19: Secondary | ICD-10-CM | POA: Diagnosis not present

## 2019-04-10 DIAGNOSIS — E11649 Type 2 diabetes mellitus with hypoglycemia without coma: Secondary | ICD-10-CM | POA: Diagnosis not present

## 2019-04-10 DIAGNOSIS — N170 Acute kidney failure with tubular necrosis: Secondary | ICD-10-CM | POA: Diagnosis not present

## 2019-04-10 DIAGNOSIS — N179 Acute kidney failure, unspecified: Secondary | ICD-10-CM | POA: Diagnosis not present

## 2019-04-15 DIAGNOSIS — E11649 Type 2 diabetes mellitus with hypoglycemia without coma: Secondary | ICD-10-CM | POA: Diagnosis not present

## 2019-04-15 DIAGNOSIS — E876 Hypokalemia: Secondary | ICD-10-CM | POA: Diagnosis not present

## 2019-04-15 DIAGNOSIS — N179 Acute kidney failure, unspecified: Secondary | ICD-10-CM | POA: Diagnosis not present

## 2019-04-15 DIAGNOSIS — U071 COVID-19: Secondary | ICD-10-CM | POA: Diagnosis not present

## 2019-04-17 ENCOUNTER — Other Ambulatory Visit: Payer: Self-pay | Admitting: *Deleted

## 2019-04-17 NOTE — Patient Outreach (Signed)
Screened for potential [facility] Care Management needs as a benefit of  [employer].  Member is currently receiving skilled therapy at [facility].  Writer attended telephonic interdisciplinary team meeting to assess for disposition needs and transition plan for resident.   Facility reports member will move in with her daughter post SNF dc.   Will continue to follow while residing in SNF.   Previously active with [facility] NP and [facility] palliative. Will send notification of member receiving skilled care at [facility].   Marthenia Rolling, MSN-Ed, RN,BSN [place] Acute Care Coordinator [phone] [facility]) [phone]  (Toll free office)

## 2019-04-18 ENCOUNTER — Other Ambulatory Visit: Payer: Self-pay

## 2019-04-18 ENCOUNTER — Non-Acute Institutional Stay: Payer: Medicare Other | Admitting: *Deleted

## 2019-04-18 DIAGNOSIS — Z515 Encounter for palliative care: Secondary | ICD-10-CM

## 2019-04-21 ENCOUNTER — Other Ambulatory Visit: Payer: Self-pay | Admitting: Internal Medicine

## 2019-04-21 DIAGNOSIS — M8580 Other specified disorders of bone density and structure, unspecified site: Secondary | ICD-10-CM

## 2019-04-21 DIAGNOSIS — I739 Peripheral vascular disease, unspecified: Secondary | ICD-10-CM

## 2019-04-21 DIAGNOSIS — E559 Vitamin D deficiency, unspecified: Secondary | ICD-10-CM

## 2019-04-21 DIAGNOSIS — I10 Essential (primary) hypertension: Secondary | ICD-10-CM

## 2019-04-21 NOTE — Progress Notes (Signed)
COMMUNITY PALLIATIVE CARE RN NOTE  PATIENT NAME: Shalin Linders DOB: [birth year] MRN: [id number]  PRIMARY CARE PROVIDER: Janith Lima, MD  RESPONSIBLE PARTY: [name] (daughter) Acct ID - Guarantor Home Phone Work Phone Relationship Acct Type  [id number] - Perz,[phone]  Self P/F     [place], [place], [place], [place] [address]   Due to the COVID-19 crisis, this virtual check-in visit was done via telephone from my office and it was initiated and consent by this patient and or family.  PLAN OF CARE and INTERVENTION:  1. ADVANCE CARE PLANNING/GOALS OF CARE: Goal is for patient to move with her daughter, [name], when she completes rehab.  2. PATIENT/CAREGIVER EDUCATION: Safe transfers, symptom management 3. DISEASE STATUS: Virtual check-in visit completed via telephone. Patient is currently at [facility] for rehab. She was admitted to [facility] after testing positive for Covid-19 on 04/03/19. She was treated with IVFs, steroids and remdesivir and transferred to SNF on 04/08/19. Patient denies pain at this time. She denies any shortness of breath. Her voice is going in and out of hoarseness. She has become progressively weaker. She was ambulatory with her walker prior to her hospitalization, but is now unable to ambulate at all. She requires 1-2 person assistance with transfers and is transported via wheelchair. She requires maximum assistance with bathing, dressing and toileting. She is able to feed herself independently. She is on a diabetic diet. Her Lantus has been decreased and her mealtime insulin has been discontinued per hospital discharge orders. She has also been placed on sliding scale insulin. She is currently working with PT/OT. Her intake is fair. She denies dysphagia. Spoke with her daughter, [name], who states that she is moving patient in with her at discharge. She plans to continue her aide services through the CAP[facility] program. Will continue to  monitor.  HISTORY OF PRESENT ILLNESS:  This is a 74 yo female who is currently in [facility] SNF for rehab s/p hospital discharge. Discharge plans are for patient to move in with her daughter, [name]. Palliative care continues to follow patient and will visit monthly and PRN.  CODE STATUS: Full code ADVANCED DIRECTIVES: Y MOST FORM: no PPS: 30%   (Duration of visit and documentation 30 minutes)   Daryl Eastern, RN BSN

## 2019-04-28 ENCOUNTER — Other Ambulatory Visit: Payer: Self-pay | Admitting: *Deleted

## 2019-04-28 DIAGNOSIS — I10 Essential (primary) hypertension: Secondary | ICD-10-CM | POA: Diagnosis not present

## 2019-04-28 DIAGNOSIS — R338 Other retention of urine: Secondary | ICD-10-CM | POA: Diagnosis not present

## 2019-04-28 DIAGNOSIS — U071 COVID-19: Secondary | ICD-10-CM | POA: Diagnosis not present

## 2019-04-28 DIAGNOSIS — Z794 Long term (current) use of insulin: Secondary | ICD-10-CM | POA: Diagnosis not present

## 2019-04-28 DIAGNOSIS — G8194 Hemiplegia, unspecified affecting left nondominant side: Secondary | ICD-10-CM | POA: Diagnosis not present

## 2019-04-28 NOTE — Patient Outreach (Addendum)
[facility] Post- Acute Care Coordinator follow up.  Michelle Lopez has been receiving skilled therapy at [facility]  Telephone call made to Michelle Lopez (daughter)  [phone].  Patient identifiers confirmed.   Michelle Lopez reports she is HCPOA. States [name] today 04/28/19. States member will have home health but she is not sure which one. States her mother will reside with her initially after dc from [place]. States member will return to her place at some eventually.  Confirmed Michelle Lopez address at [address] [place], [place] [address]. Michelle Lopez states she should be primary contact as member sometimes forgets things. Michelle Lopez's number is [phone].  Will update [facility] Care Management NP of member's transition to home today. Will alert [facility] Palliative of member's dc to home today. Will communicate with facility SW to confirm which home health agency will be arranged.    Addendum: 1230pm [facility] SN[facility] SW reports member will have [place] and that hhc agency is aware of daughter's address.   Michelle Rolling, MSN-Ed, RN,BSN [place] Acute Care Coordinator [phone] [facility]) [phone]  (Toll free office)

## 2019-04-29 ENCOUNTER — Telehealth: Payer: Self-pay

## 2019-04-29 ENCOUNTER — Telehealth: Payer: Self-pay | Admitting: Internal Medicine

## 2019-04-29 DIAGNOSIS — Z7982 Long term (current) use of aspirin: Secondary | ICD-10-CM | POA: Diagnosis not present

## 2019-04-29 DIAGNOSIS — E11649 Type 2 diabetes mellitus with hypoglycemia without coma: Secondary | ICD-10-CM | POA: Diagnosis not present

## 2019-04-29 DIAGNOSIS — E785 Hyperlipidemia, unspecified: Secondary | ICD-10-CM | POA: Diagnosis not present

## 2019-04-29 DIAGNOSIS — Z87891 Personal history of nicotine dependence: Secondary | ICD-10-CM | POA: Diagnosis not present

## 2019-04-29 DIAGNOSIS — I10 Essential (primary) hypertension: Secondary | ICD-10-CM | POA: Diagnosis not present

## 2019-04-29 DIAGNOSIS — R339 Retention of urine, unspecified: Secondary | ICD-10-CM | POA: Diagnosis not present

## 2019-04-29 DIAGNOSIS — Z794 Long term (current) use of insulin: Secondary | ICD-10-CM | POA: Diagnosis not present

## 2019-04-29 DIAGNOSIS — Z7902 Long term (current) use of antithrombotics/antiplatelets: Secondary | ICD-10-CM | POA: Diagnosis not present

## 2019-04-29 DIAGNOSIS — U071 COVID-19: Secondary | ICD-10-CM | POA: Diagnosis not present

## 2019-04-29 DIAGNOSIS — I69354 Hemiplegia and hemiparesis following cerebral infarction affecting left non-dominant side: Secondary | ICD-10-CM | POA: Diagnosis not present

## 2019-04-29 NOTE — Telephone Encounter (Signed)
Telephone call to patient to schedule palliative care visit with patient. Patient/family in agreement with home visit on 04-30-19 at 2:30PM.

## 2019-04-29 NOTE — Telephone Encounter (Signed)
    Amber from [facility] calling to request verbal order for continued home PT 2x week for 3 weeks 1x week for 5 weeks  Phone [phone]

## 2019-04-30 ENCOUNTER — Other Ambulatory Visit: Payer: Self-pay

## 2019-04-30 ENCOUNTER — Telehealth: Payer: Self-pay

## 2019-04-30 ENCOUNTER — Other Ambulatory Visit: Payer: Medicare Other

## 2019-04-30 ENCOUNTER — Other Ambulatory Visit: Payer: Self-pay | Admitting: *Deleted

## 2019-04-30 NOTE — Telephone Encounter (Signed)
Spoke with patient's daughter [name] to let her know that Margaretmary Lombard, SW is out of the office today and would need to call her back to reschedule the appointment and she was in agreement with this.

## 2019-04-30 NOTE — Telephone Encounter (Signed)
LVM with Amber giving verbal okay as requested.

## 2019-05-01 ENCOUNTER — Telehealth: Payer: Self-pay | Admitting: Internal Medicine

## 2019-05-01 DIAGNOSIS — E1121 Type 2 diabetes mellitus with diabetic nephropathy: Secondary | ICD-10-CM

## 2019-05-01 MED ORDER — TOUJEO MAX SOLOSTAR 300 UNIT/ML ~~LOC~~ SOPN: 20.0000 [IU] | PEN_INJECTOR | Freq: Every day | SUBCUTANEOUS | 0 refills | Status: DC

## 2019-05-01 MED ORDER — INSULIN ASPART 100 UNIT/ML FLEXPEN: 0.0000 [IU] | PEN_INJECTOR | Freq: Three times a day (TID) | SUBCUTANEOUS | 0 refills | Status: DC

## 2019-05-01 NOTE — Telephone Encounter (Signed)
Pt does not have any insulin.  Pt has just been released.   Changed the Novolog injection to the [employer]. Pt dtr does not know how to use the vial and syringe to inject insulin.

## 2019-05-01 NOTE — Telephone Encounter (Signed)
    Daughter calling to clarify med list. Patient has not had insulin in 2 days (no needles)  Please call [phone]

## 2019-05-02 DIAGNOSIS — R339 Retention of urine, unspecified: Secondary | ICD-10-CM | POA: Diagnosis not present

## 2019-05-02 DIAGNOSIS — I10 Essential (primary) hypertension: Secondary | ICD-10-CM | POA: Diagnosis not present

## 2019-05-02 DIAGNOSIS — E11649 Type 2 diabetes mellitus with hypoglycemia without coma: Secondary | ICD-10-CM | POA: Diagnosis not present

## 2019-05-02 DIAGNOSIS — I69354 Hemiplegia and hemiparesis following cerebral infarction affecting left non-dominant side: Secondary | ICD-10-CM | POA: Diagnosis not present

## 2019-05-02 DIAGNOSIS — E785 Hyperlipidemia, unspecified: Secondary | ICD-10-CM | POA: Diagnosis not present

## 2019-05-02 DIAGNOSIS — U071 COVID-19: Secondary | ICD-10-CM | POA: Diagnosis not present

## 2019-05-05 ENCOUNTER — Other Ambulatory Visit: Payer: Self-pay

## 2019-05-05 ENCOUNTER — Telehealth: Payer: Self-pay

## 2019-05-05 ENCOUNTER — Encounter: Payer: Self-pay | Admitting: Internal Medicine

## 2019-05-05 ENCOUNTER — Ambulatory Visit (INDEPENDENT_AMBULATORY_CARE_PROVIDER_SITE_OTHER): Payer: Medicare Other

## 2019-05-05 ENCOUNTER — Ambulatory Visit (INDEPENDENT_AMBULATORY_CARE_PROVIDER_SITE_OTHER): Payer: Medicare Other | Admitting: Internal Medicine

## 2019-05-05 VITALS — BP 132/68 | HR 91 | Temp 99.1°F | Resp 16 | Ht 63.0 in | Wt 153.0 lb

## 2019-05-05 DIAGNOSIS — I10 Essential (primary) hypertension: Secondary | ICD-10-CM

## 2019-05-05 DIAGNOSIS — R05 Cough: Secondary | ICD-10-CM

## 2019-05-05 DIAGNOSIS — R059 Cough, unspecified: Secondary | ICD-10-CM

## 2019-05-05 DIAGNOSIS — U071 COVID-19: Secondary | ICD-10-CM | POA: Diagnosis not present

## 2019-05-05 NOTE — Progress Notes (Signed)
Subjective:  Patient ID: Michelle Lopez, female    DOB: [birth year]  Age: 74 y.o. MRN: [id number]  CC: Hypertension and Cough  This visit occurred during the SARS-CoV-2 public health emergency.  Safety protocols were in place, including screening questions prior to the visit, additional usage of staff PPE, and extensive cleaning of exam room while observing appropriate contact time as indicated for disinfecting solutions.    HPI Michelle Lopez presents for f/up - She is 1 month status post diagnosis and treatment of COVID-19 infection.  She responded well to systemic steroids.  Her only lingering cough is nonproductive cough, weakness, and fatigue.  Outpatient Medications Prior to Visit  Medication Sig Dispense Refill  . aspirin 81 MG tablet Take 81 mg by mouth daily.      Marland Kitchen atorvastatin (LIPITOR) 20 MG tablet TAKE 1 TABLET BY MOUTH EVERY DAY 90 tablet 1  . Blood Glucose Monitoring Suppl (CONTOUR NEXT EZ) w/Device KIT 1 Act by Does not apply route 2 (two) times daily. 1 kit 0  . clopidogrel (PLAVIX) 75 MG tablet Take 1 tablet (75 mg total) by mouth daily. 90 tablet 1  . glucose blood (ONETOUCH VERIO) test strip Use to check blood sugars twice a day 300 each 1  . insulin aspart (NOVOLOG) 100 UNIT/ML FlexPen Inject 0-9 Units into the skin 3 (three) times daily with meals. 15 mL 0  . insulin glargine, 2 Unit Dial, (TOUJEO MAX SOLOSTAR) 300 UNIT/ML Solostar Pen Inject 20 Units into the skin daily. 6 mL 0  . Lancets (ONETOUCH ULTRASOFT) lancets Use to check blood sugars twice a day 300 each 1  . metoprolol tartrate (LOPRESSOR) 25 MG tablet Take 0.5 tablets (12.5 mg total) by mouth 2 (two) times daily. 90 tablet 1  . QC PEN NEEDLES 31G X 6 MM MISC USE TO INJECT INSULIN UP TO 5 TIMES daily 300 each 1  . RESTASIS 0.05 % ophthalmic emulsion Place 1 drop into both eyes daily.    . tamsulosin (FLOMAX) 0.4 MG CAPS capsule Take 1 capsule (0.4 mg total) by mouth daily. 90 capsule 1  . Vitamin D,  Ergocalciferol, (DRISDOL) 1.25 MG (50000 UNIT) CAPS capsule TAKE 1 CAPSULE BY MOUTH once weekly 54 capsule 0  . amLODipine (NORVASC) 5 MG tablet TAKE 1 TABLET BY MOUTH EVERY DAY (Patient taking differently: Take 5 mg by mouth daily. ) 90 tablet 1  . cetirizine (ZYRTEC) 10 MG tablet Take 1 tablet (10 mg total) by mouth daily. 90 tablet 1  . clotrimazole-betamethasone (LOTRISONE) cream Apply 1 application topically 2 (two) times daily. 45 g 1  . Glucerna (GLUCERNA) LIQD Take 1 Can by mouth 2 (two) times daily between meals. 60 Can 5   No facility-administered medications prior to visit.    ROS Review of Systems  Constitutional: Positive for fatigue. Negative for chills, diaphoresis and fever.  HENT: Negative.   Eyes: Negative for visual disturbance.  Respiratory: Positive for cough. Negative for chest tightness, shortness of breath and wheezing.   Cardiovascular: Positive for leg swelling (ankle edema). Negative for chest pain and palpitations.  Gastrointestinal: Negative for abdominal pain, constipation, diarrhea, nausea and vomiting.  Endocrine: Negative.   Genitourinary: Negative.  Negative for difficulty urinating and dysuria.  Musculoskeletal: Negative.  Negative for arthralgias and myalgias.  Skin: Negative.  Negative for color change and pallor.  Neurological: Positive for weakness. Negative for dizziness and headaches.  Hematological: Negative for adenopathy. Does not bruise/bleed easily.  Psychiatric/Behavioral: Negative.     Objective:  BP 132/68 (BP Location: Left Arm, Patient Position: Sitting, Cuff Size: Normal)   Pulse 91   Temp 99.1 F (37.3 C) (Oral)   Resp 16   Ht '5\' 3"'  (1.6 m)   Wt 153 lb (69.4 kg)   SpO2 94%   BMI 27.10 kg/m   BP Readings from Last 3 Encounters:  05/05/19 132/68  04/08/19 (!) 111/59  04/01/19 107/67    Wt Readings from Last 3 Encounters:  05/05/19 153 lb (69.4 kg)  04/03/19 155 lb (70.3 kg)  01/07/19 144 lb 12 oz (65.7 kg)     Physical Exam Vitals reviewed.  Constitutional:      General: She is not in acute distress.    Appearance: She is not toxic-appearing or diaphoretic.  HENT:     Nose: Nose normal.     Mouth/Throat:     Mouth: Mucous membranes are moist.  Eyes:     General: No scleral icterus.    Conjunctiva/sclera: Conjunctivae normal.  Cardiovascular:     Rate and Rhythm: Normal rate and regular rhythm.     Heart sounds: No murmur. No friction rub. No gallop.   Pulmonary:     Effort: Pulmonary effort is normal. No respiratory distress.     Breath sounds: No stridor. No wheezing, rhonchi or rales.  Chest:     Chest wall: No tenderness.  Abdominal:     General: Abdomen is flat. There is no distension.     Palpations: There is no mass.     Tenderness: There is no guarding.  Musculoskeletal:        General: Normal range of motion.     Cervical back: Neck supple.     Right lower leg: Edema present.     Left lower leg: Edema present.     Comments: Trace pitting edema over both ankles  Lymphadenopathy:     Cervical: No cervical adenopathy.  Skin:    General: Skin is warm and dry.     Coloration: Skin is not pale.  Neurological:     General: No focal deficit present.     Mental Status: She is alert.  Psychiatric:        Mood and Affect: Mood normal.        Behavior: Behavior normal.     Lab Results  Component Value Date   WBC 8.0 04/08/2019   HGB 13.4 04/08/2019   HCT 42.0 04/08/2019   PLT PLATELET CLUMPS NOTED ON SMEAR, UNABLE TO ESTIMATE 04/08/2019   GLUCOSE 62 (L) 04/08/2019   CHOL 148 07/31/2018   TRIG 112.0 07/31/2018   HDL 38.20 (L) 07/31/2018   LDLDIRECT 100.0 07/06/2015   LDLCALC 88 07/31/2018   ALT 21 04/08/2019   AST 29 04/08/2019   NA 137 04/08/2019   K 3.3 (L) 04/08/2019   CL 102 04/08/2019   CREATININE 1.16 (H) 04/08/2019   BUN 29 (H) 04/08/2019   CO2 24 04/08/2019   TSH 1.30 07/31/2018   INR 0.9 04/03/2019   HGBA1C 7.6 (H) 04/03/2019   MICROALBUR <0.7  07/31/2018    US RENAL  Result Date: 04/04/2019 CLINICAL DATA:  Acute kidney injury. COVID-19 infection. Altered mental status. EXAM: RENAL / URINARY TRACT ULTRASOUND COMPLETE COMPARISON:  Renal ultrasound 06/19/2018 FINDINGS: Right Kidney: Renal measurements: 10.1 x 6.0 x 5.2 cm = volume: 164.5 mL. There is cortical thinning with increased echogenicity. A small cyst is present in the interpolar region, measuring 15 mm maximally. No hydronephrosis. Left Kidney: Renal measurements: 11.2 x  6.7 x 4.9 cm = volume: 194.7 mL. Mild cortical thinning. No hydronephrosis. Bladder: Appears normal for degree of bladder distention. Other: Examination is mildly limited by the patient's mental status and bowel gas. IMPRESSION: 1. Both kidneys are mildly enlarged compared with the prior ultrasound, suggesting acute tubular necrosis or interstitial nephritis. 2. No hydronephrosis or perinephric fluid collection. Electronically Signed   By: Richardean Sale M.D.   On: 04/04/2019 12:10   DG CHEST PORT 1 VIEW  Result Date: 04/04/2019 CLINICAL DATA:  Shortness of breath. EXAM: PORTABLE CHEST 1 VIEW COMPARISON:  Twenty-fifth 2021. FINDINGS: Stable cardiomediastinal silhouette. Atherosclerosis of thoracic aorta is noted. No pneumothorax or pleural effusion is noted. No pneumothorax or pleural effusion is noted. IMPRESSION: No active disease. Aortic Atherosclerosis (ICD10-I70.0). Electronically Signed   By: Marijo Conception M.D.   On: 04/04/2019 08:48   DG Chest Port 1 View  Result Date: 04/03/2019 CLINICAL DATA:  Body aches, and sinus congestion and cough for 3 days. EXAM: PORTABLE CHEST 1 VIEW COMPARISON:  PA and lateral chest 02/25/2015. FINDINGS: The lung volumes are low. Lungs clear. No pneumothorax or pleural effusion. Heart size is normal. Atherosclerosis. No acute or focal bony abnormality. IMPRESSION: No acute disease. Atherosclerosis. Electronically Signed   By: Inge Rise M.D.   On: 04/03/2019 11:11   DG Chest  2 View  Result Date: 05/05/2019 CLINICAL DATA:  Cough for 3 days EXAM: CHEST - 2 VIEW COMPARISON:  04/06/2019 FINDINGS: The heart size and mediastinal contours are within normal limits. Atherosclerotic calcification of the aortic knob. No focal airspace consolidation, pleural effusion, or pneumothorax. The visualized skeletal structures are unremarkable. IMPRESSION: No active cardiopulmonary disease. Electronically Signed   By: Davina Poke D.O.   On: 05/05/2019 11:28     Assessment & Plan:   Taite was seen today for hypertension and cough.  Diagnoses and all orders for this visit:  Cough- Her CXR is negative for infiltrates -     DG Chest 2 View; Future  Essential hypertension, benign- Her BP is well controlled but she complains of LE edema. I have recommend that she discontinue amlodipine.  COVID-19 virus infection- She has mild lingering sx's but overall improvement noted.    I have discontinued Tammi Klippel Glucerna, clotrimazole-betamethasone, amLODipine, and cetirizine. I am also having her maintain her aspirin, onetouch ultrasoft, Contour Next EZ, glucose blood, tamsulosin, Restasis, QC Pen Needles, Vitamin D (Ergocalciferol), clopidogrel, metoprolol tartrate, atorvastatin, Toujeo Max SoloStar, and insulin aspart.  No orders of the defined types were placed in this encounter.    Follow-up: Return if symptoms worsen or fail to improve.  Scarlette Calico, MD

## 2019-05-05 NOTE — Patient Instructions (Signed)

## 2019-05-05 NOTE — Telephone Encounter (Signed)
[facility] called in needing this patients covid results faxed to them so they can start her services.     Please advise   Call back number is [phone] Ext [place]

## 2019-05-05 NOTE — Telephone Encounter (Signed)
Telephone call to patient to schedule palliative care visit with patient. Patient/family in agreement with home visit on 05-08-19 at 11:30AM.

## 2019-05-05 NOTE — Telephone Encounter (Signed)
Positive result sent, they will test patient at the start of services.

## 2019-05-06 ENCOUNTER — Telehealth: Payer: Self-pay

## 2019-05-06 NOTE — Telephone Encounter (Signed)
lvm for [name] to call back.

## 2019-05-06 NOTE — Telephone Encounter (Signed)
New message    Daughter [name] voiced it's very important someone call to discuss her mom's visit yesterday with Dr. Ronnald Ramp.

## 2019-05-07 NOTE — Telephone Encounter (Signed)
Called Michelle Lopez and she stated that the home aid service that she is hiring needed a negative COVID result. There is a previous note about it. The home aid agency will reach out and test patient themselves. Michelle Lopez stated that it will take a few days to get the results. I informed Michelle Lopez that it the usual amount of time but there may be other places that offer the rapid test.   Michelle Lopez thanked me for my time and will let me know if she needs anything for the pt or for the [facility] paperwork.

## 2019-05-08 ENCOUNTER — Other Ambulatory Visit: Payer: Medicare Other

## 2019-05-08 ENCOUNTER — Other Ambulatory Visit: Payer: Self-pay

## 2019-05-08 DIAGNOSIS — E785 Hyperlipidemia, unspecified: Secondary | ICD-10-CM | POA: Diagnosis not present

## 2019-05-08 DIAGNOSIS — U071 COVID-19: Secondary | ICD-10-CM | POA: Diagnosis not present

## 2019-05-08 DIAGNOSIS — I69354 Hemiplegia and hemiparesis following cerebral infarction affecting left non-dominant side: Secondary | ICD-10-CM | POA: Diagnosis not present

## 2019-05-08 DIAGNOSIS — Z515 Encounter for palliative care: Secondary | ICD-10-CM

## 2019-05-08 DIAGNOSIS — R339 Retention of urine, unspecified: Secondary | ICD-10-CM | POA: Diagnosis not present

## 2019-05-08 DIAGNOSIS — I10 Essential (primary) hypertension: Secondary | ICD-10-CM | POA: Diagnosis not present

## 2019-05-08 DIAGNOSIS — E11649 Type 2 diabetes mellitus with hypoglycemia without coma: Secondary | ICD-10-CM | POA: Diagnosis not present

## 2019-05-08 NOTE — Progress Notes (Signed)
COMMUNITY PALLIATIVE CARE SW NOTE  PATIENT NAME: Michelle Lopez DOB: [birth year] MRN: [id number]  PRIMARY CARE PROVIDER: Janith Lima, MD  RESPONSIBLE PARTY:  Acct ID - Guarantor Home Phone Work Phone Relationship Acct Type  [id number] - Ponzo,[phone]  Self P/F     [address], [address], [place], [place] [address]     PLAN OF CARE and INTERVENTIONS:             1. GOALS OF CARE/ ADVANCE CARE PLANNING:  Patient is unsure of code status at this time. Discussed with patient and she plans to talk with daughters. No other advance care directives in place. Goal is to remain at home and continue to grow stronger.  2. SOCIAL/EMOTIONAL/SPIRITUAL ASSESSMENT/ INTERVENTIONS:  SW and RN met with patient and "[name]" (patient's daughter). Patient denies pain, other than in her groin area when ambulating and rates it 9 out of 10. Patient said she has a good appetite. Patient noted she does not "sleep good", but daughter said that she appears to be sleeping good when she checks. Patient had no concerns today. Patient is divorced. Patient said she has two adult daughters- [name] and Honor Loh [name]), and two children that have died. Patient has many grandchildren and great grandchildren and is looking forward to seeing them again. Discussed safety in the home. SW provided education on palliative care, provided emotional support and used active and reflective listening.  3. PATIENT/CAREGIVER EDUCATION/ COPING:  Patient was alert, oriented. Patient enjoys watching television. Patient seems to be coping well. Patient is happy to be home and was tearful when her CAP aide come in to see her for the first time in a month. Family dynamics are unclear. Daughters are supportive per patient.  4. PERSONAL EMERGENCY PLAN:  Family/caregiver will call 9-1-1 for emergencies. Patient has a LifeAlert necklace.  5. COMMUNITY RESOURCES COORDINATION/ HEALTH CARE NAVIGATION:  Family manages care. Patient is receiving PT/OT  from [place]. Patient has CAP aide from 8AM-3PM, the agency is [facility]. Patient saw PCP on 3/29. 6. FINANCIAL/LEGAL CONCERNS/INTERVENTIONS:  Patient has Medicaid, and is receiving CAP services.      SOCIAL HX:  Social History   Tobacco Use  . Smoking status: Never Smoker  . Smokeless tobacco: Never Used  Substance Use Topics  . Alcohol use: No    Alcohol/week: 0.0 standard drinks    CODE STATUS:   Code Status: Prior (FULL) ADVANCED DIRECTIVES: N MOST FORM COMPLETE:  No. HOSPICE EDUCATION PROVIDED: None.  PPS: Patient is ambulating with walker and standby. Patient needs assistance with bathing and dressing, and meal prep.  I spent100mnutes with patient/family, [id number], support and consultation.  WMargaretmary Lombard LCSW

## 2019-05-08 NOTE — Progress Notes (Signed)
PATIENT NAME: Michelle Lopez DOB: 09/03/1945 MRN: [id number]  PRIMARY CARE PROVIDER: Jones, Michelle L, MD  RESPONSIBLE PARTY:  Acct ID - Guarantor Home Phone Work Phone Relationship Acct Type  [id number] - [name],CAR* [phone]  Self P/F     [address], [address], [place], [place] [address]    PLAN OF CARE and INTERVENTIONS:               1.  GOALS OF CARE/ ADVANCE CARE PLANNING:  Remain in home with daughter and CAP aide assisting patient with personal care.                      2.  PATIENT/CAREGIVER EDUCATION:  Education on all precautions, education on s/s of infection, reviewed palliative care services, reviewed meds, support               4. PERSONAL EMERGENCY PLAN:  Patient has Lif[facility] in place.               5.  DISEASE STATUS: SW and RN made scheduled palliative care home visit. Palliative care team met with patient and her daughter Michelle Lopez. Patients CAP aide Michelle Lopez to resume services today. Patients CAP aide is in the home 7 days a week from 8 AM  until 3 PM.  Patient has been out of the hospital for 2 weeks per daughter. Patient was at [facility] 1 week for rehab and return to home a week ago. Patient receiving PT and OT [facility]. Therapy was leaving the home when palliative care team arrived. Therapist reports patient did well with therapy. Patient reports she tires easily after receiving therapy. Patient is ambulating with her rollator walker. Paient is 5'2 1/2 and is 148 pounds. Patients appetite is good and daughter confirms patient is eating well. Patient denies having any shortness of breath but does have a non productive cough. Daughter reports Dr  Lopez said patient may have cough for a while and not to worry about patient coughing. Patients daughter reports patient has been sleeping well however patient reports she sleeps during the day and is awake at night. Patient's vital signs are stable. Patient denied pain then reported she has pain in her groin area  and feels this is due to receiving PT.  Patient has edema in her left foot and ankle. Patient has no edema and her right foot. Patient's blood sugar was 224 this AM.  Nurse reviewed patient's medications with daughter and patients insulin has been decreased to 20 units daily and other insulin has been discontinued.  Patient is continent of bowel and bladder. Patient remains in agreement with palliative care services. Patient, daughter and caregiver encouraged to contact palliative care with questions or concerns. Patient has Life Alert.      HISTORY OF PRESENT ILLNESS: Patient is a 74 year old female who resides in her own home.  Patient being followed by paliative care and will be seen monthly and PRN.    CODE STATUS: No Code   ADVANCED DIRECTIVES: N MOST FORM: No PPS: 50%   PHYSICAL EXAM:   VITALS: Today's Vitals   05/08/19 1124  BP: (!) 142/90  Pulse: 79  Resp: 18  Temp: 98.7 F (37.1 C)  TempSrc: Temporal  SpO2: 95%  Weight: 148 lb (67.1 kg)  Height: 5' (1.524 m)  PainSc: 9   PainLoc: Groin    LUNGS: clear to auscultation  CARDIAC: Cor RRR  EXTREMITIES: Trace edema in LLE SKIN:   Skin color, texture, turgor normal. No rashes or lesions  NEURO: positive for gait problems and weakness        , RN 

## 2019-05-09 ENCOUNTER — Other Ambulatory Visit: Payer: Self-pay | Admitting: *Deleted

## 2019-05-09 DIAGNOSIS — R339 Retention of urine, unspecified: Secondary | ICD-10-CM | POA: Diagnosis not present

## 2019-05-09 DIAGNOSIS — U071 COVID-19: Secondary | ICD-10-CM | POA: Diagnosis not present

## 2019-05-09 DIAGNOSIS — I10 Essential (primary) hypertension: Secondary | ICD-10-CM | POA: Diagnosis not present

## 2019-05-09 DIAGNOSIS — E11649 Type 2 diabetes mellitus with hypoglycemia without coma: Secondary | ICD-10-CM | POA: Diagnosis not present

## 2019-05-09 DIAGNOSIS — E785 Hyperlipidemia, unspecified: Secondary | ICD-10-CM | POA: Diagnosis not present

## 2019-05-09 DIAGNOSIS — I69354 Hemiplegia and hemiparesis following cerebral infarction affecting left non-dominant side: Secondary | ICD-10-CM | POA: Diagnosis not present

## 2019-05-09 NOTE — Patient Outreach (Signed)
Telephone outreach.  Spoke with pt's daughter Michelle Lopez, briefly. She requested that I call back on Monday to update me on her mother's condition. She did acknowledge that Palliative Care services from [facility] are in place.  I will call her on Monday.  Michelle Lopez. Michelle Neither, MSN, [facility] Gerontological Nurse Practitioner [facility] Care Management [phone]

## 2019-05-12 ENCOUNTER — Other Ambulatory Visit: Payer: Self-pay | Admitting: *Deleted

## 2019-05-12 NOTE — Patient Outreach (Signed)
Telephone outreach:  Eulah Pont. Myrtie Neither, MSN, [facility] Gerontological Nurse Practitioner [facility] Care Management [phone]

## 2019-05-13 DIAGNOSIS — R339 Retention of urine, unspecified: Secondary | ICD-10-CM | POA: Diagnosis not present

## 2019-05-13 DIAGNOSIS — U071 COVID-19: Secondary | ICD-10-CM | POA: Diagnosis not present

## 2019-05-13 DIAGNOSIS — E11649 Type 2 diabetes mellitus with hypoglycemia without coma: Secondary | ICD-10-CM | POA: Diagnosis not present

## 2019-05-13 DIAGNOSIS — I69354 Hemiplegia and hemiparesis following cerebral infarction affecting left non-dominant side: Secondary | ICD-10-CM | POA: Diagnosis not present

## 2019-05-13 DIAGNOSIS — E785 Hyperlipidemia, unspecified: Secondary | ICD-10-CM | POA: Diagnosis not present

## 2019-05-13 DIAGNOSIS — I10 Essential (primary) hypertension: Secondary | ICD-10-CM | POA: Diagnosis not present

## 2019-05-14 DIAGNOSIS — U071 COVID-19: Secondary | ICD-10-CM | POA: Diagnosis not present

## 2019-05-14 DIAGNOSIS — I10 Essential (primary) hypertension: Secondary | ICD-10-CM | POA: Diagnosis not present

## 2019-05-14 DIAGNOSIS — E11649 Type 2 diabetes mellitus with hypoglycemia without coma: Secondary | ICD-10-CM | POA: Diagnosis not present

## 2019-05-14 DIAGNOSIS — R339 Retention of urine, unspecified: Secondary | ICD-10-CM | POA: Diagnosis not present

## 2019-05-14 DIAGNOSIS — I69354 Hemiplegia and hemiparesis following cerebral infarction affecting left non-dominant side: Secondary | ICD-10-CM | POA: Diagnosis not present

## 2019-05-14 DIAGNOSIS — E785 Hyperlipidemia, unspecified: Secondary | ICD-10-CM | POA: Diagnosis not present

## 2019-05-16 DIAGNOSIS — U071 COVID-19: Secondary | ICD-10-CM | POA: Diagnosis not present

## 2019-05-16 DIAGNOSIS — Z794 Long term (current) use of insulin: Secondary | ICD-10-CM

## 2019-05-16 DIAGNOSIS — E11649 Type 2 diabetes mellitus with hypoglycemia without coma: Secondary | ICD-10-CM | POA: Diagnosis not present

## 2019-05-16 DIAGNOSIS — I69354 Hemiplegia and hemiparesis following cerebral infarction affecting left non-dominant side: Secondary | ICD-10-CM | POA: Diagnosis not present

## 2019-05-16 DIAGNOSIS — Z7902 Long term (current) use of antithrombotics/antiplatelets: Secondary | ICD-10-CM

## 2019-05-16 DIAGNOSIS — E785 Hyperlipidemia, unspecified: Secondary | ICD-10-CM | POA: Diagnosis not present

## 2019-05-16 DIAGNOSIS — Z7982 Long term (current) use of aspirin: Secondary | ICD-10-CM

## 2019-05-16 DIAGNOSIS — I10 Essential (primary) hypertension: Secondary | ICD-10-CM | POA: Diagnosis not present

## 2019-05-16 DIAGNOSIS — Z87891 Personal history of nicotine dependence: Secondary | ICD-10-CM

## 2019-05-16 DIAGNOSIS — R339 Retention of urine, unspecified: Secondary | ICD-10-CM

## 2019-05-19 DIAGNOSIS — U071 COVID-19: Secondary | ICD-10-CM | POA: Diagnosis not present

## 2019-05-19 DIAGNOSIS — E11649 Type 2 diabetes mellitus with hypoglycemia without coma: Secondary | ICD-10-CM | POA: Diagnosis not present

## 2019-05-19 DIAGNOSIS — I10 Essential (primary) hypertension: Secondary | ICD-10-CM | POA: Diagnosis not present

## 2019-05-19 DIAGNOSIS — I69354 Hemiplegia and hemiparesis following cerebral infarction affecting left non-dominant side: Secondary | ICD-10-CM | POA: Diagnosis not present

## 2019-05-19 DIAGNOSIS — E785 Hyperlipidemia, unspecified: Secondary | ICD-10-CM | POA: Diagnosis not present

## 2019-05-19 DIAGNOSIS — R339 Retention of urine, unspecified: Secondary | ICD-10-CM | POA: Diagnosis not present

## 2019-05-21 DIAGNOSIS — I69354 Hemiplegia and hemiparesis following cerebral infarction affecting left non-dominant side: Secondary | ICD-10-CM | POA: Diagnosis not present

## 2019-05-21 DIAGNOSIS — E785 Hyperlipidemia, unspecified: Secondary | ICD-10-CM | POA: Diagnosis not present

## 2019-05-21 DIAGNOSIS — E11649 Type 2 diabetes mellitus with hypoglycemia without coma: Secondary | ICD-10-CM | POA: Diagnosis not present

## 2019-05-21 DIAGNOSIS — I10 Essential (primary) hypertension: Secondary | ICD-10-CM | POA: Diagnosis not present

## 2019-05-21 DIAGNOSIS — U071 COVID-19: Secondary | ICD-10-CM | POA: Diagnosis not present

## 2019-05-21 DIAGNOSIS — R339 Retention of urine, unspecified: Secondary | ICD-10-CM | POA: Diagnosis not present

## 2019-05-22 ENCOUNTER — Telehealth: Payer: Self-pay

## 2019-05-22 NOTE — Telephone Encounter (Signed)
LVM for [name] to call back and let me know if she ordered the Home O2 for pt.   RE: Please let me know (when [name] calls back) if she wants mom to have the oxygen as stated above.

## 2019-05-22 NOTE — Telephone Encounter (Signed)
New message   Michelle Lopez with [facility] calling   Checking on the status of Home orders fax over on  3.11.21 & 3.30.21   Please advise.

## 2019-05-26 ENCOUNTER — Telehealth: Payer: Self-pay | Admitting: Internal Medicine

## 2019-05-26 NOTE — Patient Outreach (Signed)
[facility]) Care Management  05/26/2019  Michelle Lopez December 11, 1945 [id number]   Pt is active with palliative care of [facility], now. No further [facility] needs.  Eulah Pont. Myrtie Neither, MSN, [facility] Gerontological Nurse Practitioner [facility] Care Management [phone]

## 2019-05-26 NOTE — Telephone Encounter (Signed)
Spoke to [place] and gave verbal okay as requested.

## 2019-05-26 NOTE — Telephone Encounter (Signed)
    Michelle Lopez from [facility] requesting verbal order for home OT 1x week for 1 week 2x week for 2 weeks 1x week for 4 weeks  Please call [phone]

## 2019-05-27 DIAGNOSIS — I69354 Hemiplegia and hemiparesis following cerebral infarction affecting left non-dominant side: Secondary | ICD-10-CM | POA: Diagnosis not present

## 2019-05-27 DIAGNOSIS — U071 COVID-19: Secondary | ICD-10-CM | POA: Diagnosis not present

## 2019-05-27 DIAGNOSIS — E785 Hyperlipidemia, unspecified: Secondary | ICD-10-CM | POA: Diagnosis not present

## 2019-05-27 DIAGNOSIS — R339 Retention of urine, unspecified: Secondary | ICD-10-CM | POA: Diagnosis not present

## 2019-05-27 DIAGNOSIS — E11649 Type 2 diabetes mellitus with hypoglycemia without coma: Secondary | ICD-10-CM | POA: Diagnosis not present

## 2019-05-27 DIAGNOSIS — I10 Essential (primary) hypertension: Secondary | ICD-10-CM | POA: Diagnosis not present

## 2019-05-28 DIAGNOSIS — R339 Retention of urine, unspecified: Secondary | ICD-10-CM | POA: Diagnosis not present

## 2019-05-28 DIAGNOSIS — E785 Hyperlipidemia, unspecified: Secondary | ICD-10-CM | POA: Diagnosis not present

## 2019-05-28 DIAGNOSIS — I10 Essential (primary) hypertension: Secondary | ICD-10-CM | POA: Diagnosis not present

## 2019-05-28 DIAGNOSIS — E11649 Type 2 diabetes mellitus with hypoglycemia without coma: Secondary | ICD-10-CM | POA: Diagnosis not present

## 2019-05-28 DIAGNOSIS — U071 COVID-19: Secondary | ICD-10-CM | POA: Diagnosis not present

## 2019-05-28 DIAGNOSIS — I69354 Hemiplegia and hemiparesis following cerebral infarction affecting left non-dominant side: Secondary | ICD-10-CM | POA: Diagnosis not present

## 2019-05-29 DIAGNOSIS — Z794 Long term (current) use of insulin: Secondary | ICD-10-CM | POA: Diagnosis not present

## 2019-05-29 DIAGNOSIS — Z87891 Personal history of nicotine dependence: Secondary | ICD-10-CM | POA: Diagnosis not present

## 2019-05-29 DIAGNOSIS — E785 Hyperlipidemia, unspecified: Secondary | ICD-10-CM | POA: Diagnosis not present

## 2019-05-29 DIAGNOSIS — U071 COVID-19: Secondary | ICD-10-CM | POA: Diagnosis not present

## 2019-05-29 DIAGNOSIS — Z7982 Long term (current) use of aspirin: Secondary | ICD-10-CM | POA: Diagnosis not present

## 2019-05-29 DIAGNOSIS — I10 Essential (primary) hypertension: Secondary | ICD-10-CM | POA: Diagnosis not present

## 2019-05-29 DIAGNOSIS — Z7902 Long term (current) use of antithrombotics/antiplatelets: Secondary | ICD-10-CM | POA: Diagnosis not present

## 2019-05-29 DIAGNOSIS — E11649 Type 2 diabetes mellitus with hypoglycemia without coma: Secondary | ICD-10-CM | POA: Diagnosis not present

## 2019-05-29 DIAGNOSIS — R339 Retention of urine, unspecified: Secondary | ICD-10-CM | POA: Diagnosis not present

## 2019-05-29 DIAGNOSIS — I69354 Hemiplegia and hemiparesis following cerebral infarction affecting left non-dominant side: Secondary | ICD-10-CM | POA: Diagnosis not present

## 2019-06-06 ENCOUNTER — Telehealth: Payer: Self-pay

## 2019-06-09 NOTE — Telephone Encounter (Signed)
Telephone call to patient to schedule palliative care visit with patient. Patient/family in agreement with home visit on 06-11-19 at 9:00AM.

## 2019-06-11 ENCOUNTER — Other Ambulatory Visit: Payer: Self-pay

## 2019-06-11 ENCOUNTER — Other Ambulatory Visit: Payer: Medicare Other

## 2019-06-11 DIAGNOSIS — Z515 Encounter for palliative care: Secondary | ICD-10-CM

## 2019-06-11 NOTE — Progress Notes (Signed)
COMMUNITY PALLIATIVE CARE SW NOTE  PATIENT NAME: Michelle Lopez DOB: 1945/06/22 MRN: [id number]  PRIMARY CARE PROVIDER: Janith Lima, MD  RESPONSIBLE PARTY:  Acct ID - Guarantor Home Phone Work Phone Relationship Acct Type  [id number] - Mino,[phone]  Self P/F     [address], [address], [place], [place] [address]     PLAN OF CARE and INTERVENTIONS:             1. GOALS OF CARE/ ADVANCE CARE PLANNING:  Patient is FULL CODE, ongoing discussion. No advance care directives in place. Goal is to remain at home and continue to grow stronger.  2. SOCIAL/EMOTIONAL/SPIRITUAL ASSESSMENT/ INTERVENTIONS:  SW completed TELEHEALTH visit with patient and "[name]" (patient's daughter). Patient denies pain, patient does have soreness with exercise. Patient has a good appetite, drinks Glucerna shakes. Patient gets her shakes through CAP. [name] checks patient's blood sugar twice a day. [name] said patient's blood sugar has been as high as 200. SW encouraged [name] to continue to monitor and contact PCP with any concerns regarding adjusting patient's insulin. [name] said patient did get to go to the park last week and enjoyed this. Patient also enjoys visits from her grandchildren. [name] said patient spends most of the time watching old Westerns and Family Feud. SW provided emotional support, discussed goals, and used active and reflective listening.  3. PATIENT/CAREGIVER EDUCATION/ COPING:  Patient was alert, engaged. Patient seems to be coping well per [name]. No concerns reported. Daughters are supportive.  4. PERSONAL EMERGENCY PLAN:  Family/caregiver will call 9-1-1 for emergencies. Patient also has a LifeAlert necklace.  5. COMMUNITY RESOURCES COORDINATION/ HEALTH CARE NAVIGATION:  Family coordinates care. Patient is receiving PT with [facility], once a week. Patient has CAP aide from 8AM-3PM, the agency is [facility].  6. FINANCIAL/LEGAL CONCERNS/INTERVENTIONS:   Patient has Medicaid, and is enrolled in CAP.      SOCIAL HX:  Social History   Tobacco Use  . Smoking status: Never Smoker  . Smokeless tobacco: Never Used  Substance Use Topics  . Alcohol use: No    Alcohol/week: 0.0 standard drinks    CODE STATUS:   Code Status: Prior  ADVANCED DIRECTIVES: N MOST FORM COMPLETE:  No. HOSPICE EDUCATION PROVIDED: None.  PPS: Patient is ambulating with walker and standby assist. Patient needs assistance with bathing and dressing, and meals.  Due to the COVID-19 , this visit was done via telephone from my office and it was initiated and consent by this patient and/or family. This was a scheduled visit.  I spent28minutes with patient/family, from9:15-9:45aproviding education, support and consultation.  Margaretmary Lombard, LCSW

## 2019-06-16 ENCOUNTER — Other Ambulatory Visit: Payer: Self-pay | Admitting: Internal Medicine

## 2019-06-16 DIAGNOSIS — I10 Essential (primary) hypertension: Secondary | ICD-10-CM

## 2019-07-11 ENCOUNTER — Telehealth: Payer: Self-pay | Admitting: Emergency Medicine

## 2019-07-11 NOTE — Telephone Encounter (Signed)
Pts daughter is calling stating her mom had covid about 2 months ago and was told she had to wait to get the vaccine. She is wondering if it is safe for her to have the vaccine now and asked that you give her a call. Thanks

## 2019-07-14 ENCOUNTER — Telehealth: Payer: Self-pay

## 2019-07-14 NOTE — Telephone Encounter (Signed)
Visit scheduled for Friday 07/18/19 with Palliative care.

## 2019-07-14 NOTE — Telephone Encounter (Signed)
Called and left message for patient's daughter today.

## 2019-07-15 ENCOUNTER — Ambulatory Visit: Payer: Medicare Other | Admitting: Internal Medicine

## 2019-07-17 ENCOUNTER — Other Ambulatory Visit: Payer: Self-pay | Admitting: *Deleted

## 2019-07-17 ENCOUNTER — Encounter: Payer: Self-pay | Admitting: *Deleted

## 2019-07-17 NOTE — Patient Outreach (Signed)
[facility]) Care Management  07/17/2019  Prudie Guthridge [birth year] [id number]   Case closure outreach.  Talked with Mrs. Maricela Bo, pt's daughter today.  Daughter states she has been very disappointed in the current CAPS provider ([facility]) and has given them notice she won't be needing them further. Caregivers did not do their job, left the home for meals, didn't show up multiple times.  Reports they have a difficult time managing her diabetes with multiple caregivers and levels of competency.   Sent Dr. Ronnald Ramp a message requesting diabetes regimen simplification. Suggested changing to basal insulin only.  Advised of case closure as they are being served by [facility].  Eulah Pont. Myrtie Neither, MSN, [facility] Gerontological Nurse Practitioner [facility] Care Management [phone]

## 2019-07-18 ENCOUNTER — Other Ambulatory Visit: Payer: Medicare Other

## 2019-07-18 ENCOUNTER — Ambulatory Visit (INDEPENDENT_AMBULATORY_CARE_PROVIDER_SITE_OTHER): Payer: Medicare Other | Admitting: Family

## 2019-07-18 ENCOUNTER — Encounter: Payer: Self-pay | Admitting: Family

## 2019-07-18 ENCOUNTER — Telehealth: Payer: Self-pay

## 2019-07-18 ENCOUNTER — Other Ambulatory Visit: Payer: Self-pay

## 2019-07-18 VITALS — BP 150/74 | HR 80 | Temp 98.0°F | Ht 60.0 in | Wt 150.0 lb

## 2019-07-18 DIAGNOSIS — W57XXXA Bitten or stung by nonvenomous insect and other nonvenomous arthropods, initial encounter: Secondary | ICD-10-CM

## 2019-07-18 DIAGNOSIS — E1121 Type 2 diabetes mellitus with diabetic nephropathy: Secondary | ICD-10-CM | POA: Diagnosis not present

## 2019-07-18 DIAGNOSIS — T148XXA Other injury of unspecified body region, initial encounter: Secondary | ICD-10-CM | POA: Diagnosis not present

## 2019-07-18 LAB — COMPREHENSIVE METABOLIC PANEL
ALT: 11 U/L (ref 0–35)
AST: 15 U/L (ref 0–37)
Albumin: 3.9 g/dL (ref 3.5–5.2)
Alkaline Phosphatase: 72 U/L (ref 39–117)
BUN: 19 mg/dL (ref 6–23)
CO2: 31 mEq/L (ref 19–32)
Calcium: 9.3 mg/dL (ref 8.4–10.5)
Chloride: 101 mEq/L (ref 96–112)
Creatinine, Ser: 1.13 mg/dL (ref 0.40–1.20)
GFR: 56.58 mL/min — ABNORMAL LOW (ref >60.00)
Glucose, Bld: 301 mg/dL — ABNORMAL HIGH (ref 70–99)
Potassium: 3.8 mEq/L (ref 3.5–5.1)
Sodium: 137 mEq/L (ref 135–145)
Total Bilirubin: 0.3 mg/dL (ref 0.2–1.2)
Total Protein: 7.4 g/dL (ref 6.0–8.3)

## 2019-07-18 MED ORDER — CEPHALEXIN 500 MG PO CAPS: 500.0000 mg | ORAL_CAPSULE | Freq: Three times a day (TID) | ORAL | 0 refills | Status: DC

## 2019-07-18 MED ORDER — PREDNISONE 20 MG PO TABS: 20.0000 mg | ORAL_TABLET | Freq: Every day | ORAL | 0 refills | Status: DC

## 2019-07-18 NOTE — Progress Notes (Signed)
Michelle Lopez is a 74 y.o. female with the following history as recorded in EpicCare:  Patient Active Problem List   Diagnosis Date Noted  . Tinea corporis 12/17/2018  . CRI (chronic renal insufficiency), stage 4 (severe) ([place]) 02/15/2018  . Protein-calorie malnutrition, mild ([place]) 10/17/2017  . B12 deficiency 08/23/2017  . Primary osteoarthritis of right hip 04/03/2017  . PAD (peripheral artery disease) ([place]) 07/24/2016  . Vitamin D deficiency 07/06/2015  . Osteopenia, senile 03/03/2014  . 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 Seborrheic dermatitis 03/05/2013  . Dysphagia as late effect of stroke 05/30/2011  . Dementia arising in the senium and presenium ([place]) 05/02/2011  . Routine general medical examination at a health care facility 05/02/2011  . Hyperlipidemia with target LDL less than 70   . Visit for screening mammogram 09/15/2010  . DEPRESSION 06/24/2009  . VERTIGO, CENTRAL 01/14/2009  . Insomnia 01/14/2009  . CVA (cerebral vascular accident) ([place]) 06/11/2008  . DM (diabetes mellitus), type 2 with renal complications ([place]) 85/27/7824  . Essential hypertension, benign 06/05/2007    Current Outpatient Medications  Medication Sig Dispense Refill  . aspirin 81 MG tablet Take 81 mg by mouth daily.      Marland Kitchen atorvastatin (LIPITOR) 20 MG tablet TAKE 1 TABLET BY MOUTH EVERY DAY 90 tablet 1  . Blood Glucose Monitoring Suppl (CONTOUR NEXT EZ) w/Device KIT 1 Act by Does not apply route 2 (two) times daily. 1 kit 0  . clopidogrel (PLAVIX) 75 MG tablet Take 1 tablet (75 mg total) by mouth daily. 90 tablet 1  . glucose blood (ONETOUCH VERIO) test strip Use to check blood sugars twice a day 300 each 1  . insulin glargine, 2 Unit Dial, (TOUJEO MAX SOLOSTAR) 300 UNIT/ML Solostar Pen Inject 20 Units into the skin daily. 6 mL 0  . Lancets (ONETOUCH ULTRASOFT) lancets Use to check blood sugars twice a day 300 each 1  . metoprolol tartrate (LOPRESSOR) 25 MG tablet Take 0.5  tablets (12.5 mg total) by mouth 2 (two) times daily. 90 tablet 1  . QC PEN NEEDLES 31G X 6 MM MISC USE TO INJECT INSULIN UP TO 5 TIMES daily 300 each 1  . RESTASIS 0.05 % ophthalmic emulsion Place 1 drop into both eyes daily.    . tamsulosin (FLOMAX) 0.4 MG CAPS capsule Take 1 capsule (0.4 mg total) by mouth daily. 90 capsule 1  . Vitamin D, Ergocalciferol, (DRISDOL) 1.25 MG (50000 UNIT) CAPS capsule TAKE 1 CAPSULE BY MOUTH once weekly 54 capsule 0  . cephALEXin (KEFLEX) 500 MG capsule Take 1 capsule (500 mg total) by mouth 3 (three) times daily. 15 capsule 0  . insulin aspart (NOVOLOG) 100 UNIT/ML FlexPen Inject 0-9 Units into the skin 3 (three) times daily with meals. (Patient not taking: Reported on 07/18/2019) 15 mL 0  . permethrin (ELIMITE) 5 % cream Apply 1 application topically once. (Patient not taking: Reported on 07/18/2019)    . predniSONE (DELTASONE) 20 MG tablet Take 1 tablet (20 mg total) by mouth daily with breakfast. 5 tablet 0   No current facility-administered medications for this visit.    Allergies: Amlodipine, Crestor [rosuvastatin calcium], Morphine, Pravastatin sodium, and Simvastatin  Past Medical History:  Diagnosis Date  . Cerebrovascular disease, unspecified   . Cocaine abuse, unspecified   . Diabetes mellitus    type 2, uncontrolled  . Diarrhea   . Essential hypertension, benign   . Hyperlipidemia   . Stroke ([place])    x7  Past Surgical History:  Procedure Laterality Date  . TOTAL HIP ARTHROPLASTY  1970   Dr. Rodell Perna    Family History  Problem Relation Age of Onset  . Colon cancer Neg Hx     Social History   Tobacco Use  . Smoking status: Never Smoker  . Smokeless tobacco: Never Used  Substance Use Topics  . Alcohol use: No    Alcohol/week: 0.0 standard drinks    Subjective:  Presents with allergic reaction to insect bites; went to [facility] days ago and was treated for potential scabies with no relief; does live in an apartment community- in the  process of having her home evaluated and treated for bed begs; denies any new soaps, foods, detergents or medications.   Daughter is present for the visit; notes that the palliative care nurse wanted to discuss patient's insulin and was supposed to call office; in reviewing medications, daughter says the patient is only taking Toujeo and not Novolog; overdue for Hgba1c;   Objective:  Vitals:   07/18/19 1338  BP: (!) 150/74  Pulse: 80  Temp: 98 F (36.7 C)  TempSrc: Oral  SpO2: 95%  Weight: 150 lb (68 kg)  Height: 5' (1.524 m)    General: Well developed, well nourished, in no acute distress  Skin : Warm and dry. Scattered papular lesions noted on upper and lower extremities; one fluid filled blister noted on left upper arm;  Head: Normocephalic and atraumatic  Eyes: Sclera and conjunctiva clear; pupils round and reactive to light; extraocular movements intact  Ears: External normal; canals clear; tympanic membranes normal  Oropharynx: Pink, supple. No suspicious lesions  Neck: Supple without thyromegaly, adenopathy  Lungs: Respirations unlabored;  Neurologic: Alert and oriented; speech intact; face symmetrical; moves all extremities well; CNII-XII intact without focal deficit   Assessment:  1. Bedbug bite, initial encounter   2. Type 2 diabetes mellitus with diabetic nephropathy, without long-term current use of insulin ([place])     Plan:  1. Encouraged to have pest treatment done in the home; Rx for Prednisone 20 mg qd x 5 days, Keflex 500 mg tid prn; continue hydroxyzine 25 mg prn for itching; 2. Check labs today; ? If she is supposed to be on Novolog; needs to schedule a follow-up with her PCP for medication discussion.  This visit occurred during the SARS-CoV-2 public health emergency.  Safety protocols were in place, including screening questions prior to the visit, additional usage of staff PPE, and extensive cleaning of exam room while observing appropriate contact time as  indicated for disinfecting solutions.     Return in about 1 week (around 07/25/2019) for with Dr. Ronnald Ramp for diabetes follow up.  Orders Placed This Encounter  Procedures  . Comp Met (CMET)  . HgB A1c    Requested Prescriptions   Signed Prescriptions Disp Refills  . predniSONE (DELTASONE) 20 MG tablet 5 tablet 0    Sig: Take 1 tablet (20 mg total) by mouth daily with breakfast.  . cephALEXin (KEFLEX) 500 MG capsule 15 capsule 0    Sig: Take 1 capsule (500 mg total) by mouth 3 (three) times daily.

## 2019-07-18 NOTE — Telephone Encounter (Signed)
Phone call placed to patient's daughter to confirm visit for today. Daughter shared that need to reschedule visit. Patient has an area of concern with suspected bug bite. Patient will be seen by her PCP today. Will call to reschedule next week.

## 2019-07-21 LAB — HEMOGLOBIN A1C: Hgb A1c MFr Bld: 9.5 % — ABNORMAL HIGH (ref 4.6–6.5)

## 2019-07-21 NOTE — Progress Notes (Signed)
Diabetes is not controlled; keep planned follow-up with Dr. Ronnald Ramp for 6/16 to review and discuss medication changes.

## 2019-07-23 ENCOUNTER — Ambulatory Visit: Payer: Medicare Other | Admitting: Internal Medicine

## 2019-07-23 ENCOUNTER — Telehealth: Payer: Self-pay

## 2019-07-28 ENCOUNTER — Other Ambulatory Visit: Payer: Self-pay | Admitting: Internal Medicine

## 2019-07-28 DIAGNOSIS — I10 Essential (primary) hypertension: Secondary | ICD-10-CM

## 2019-07-28 NOTE — Telephone Encounter (Signed)
VM left offering to schedule visit with Palliative Care.

## 2019-08-01 ENCOUNTER — Telehealth: Payer: Self-pay

## 2019-08-01 NOTE — Telephone Encounter (Signed)
Telephone call to schedule palliative care visit.  Family in agreement with palliative care visit 08/06/19 at 11:30 AM.

## 2019-08-06 ENCOUNTER — Other Ambulatory Visit: Payer: Self-pay

## 2019-08-06 ENCOUNTER — Telehealth: Payer: Self-pay

## 2019-08-06 ENCOUNTER — Other Ambulatory Visit: Payer: Medicare Other

## 2019-08-06 DIAGNOSIS — Z515 Encounter for palliative care: Secondary | ICD-10-CM

## 2019-08-06 NOTE — Progress Notes (Signed)
COMMUNITY PALLIATIVE CARE SW NOTE  PATIENT NAME: Michelle Lopez DOB: 04/05/1945 MRN: [id number]  PRIMARY CARE PROVIDER: Janith Lima, MD  RESPONSIBLE PARTY:  Acct ID - Guarantor Home Phone Work Phone Relationship Acct Type  [id number] - [place],[phone]  Self P/F     [address], [address], [place], [place] [address]     PLAN OF CARE and INTERVENTIONS:             1. GOALS OF CARE/ ADVANCE CARE PLANNING:   Patient is FULL CODE.Marland Kitchen Goal is to remain at home and continue to grow stronger. 2. SOCIAL/EMOTIONAL/SPIRITUAL ASSESSMENT/ INTERVENTIONS:  SW and RN met with patient and patients daughter, Michelle Lopez, today for scheduled palliative care visit. Patient was doing leg exercises in kitchen upon arrival. Patient shared that her legs her sore, bu it was due to not doing her leg exercises for a while. Patient  Has not had any falls recently. Patient and daughter stated that they did not have a bed bugs and that they bites on patient were due to some type of beetle that had gotten into patients clothes while she was sitting outside on the front porch - daughter showed SW and RN a picture of the beetle that she believes caused the bites.Daughter shared that she has been trying to contact patients leasing office to request an exterminator or insect treatment be done to the home, but has not had any luck. SW called front office during visit as well and was not able to speak with any one. SW suggested patient utilizes a bug/insect repellent while sitting outside. Patient continues to have aids through the CAP program 7 days a week 8am-2pm. Patient sleeps well and is eating fine, patient shared she does not have a big appetite, but does continue to drink glucernas. Daughter reported that patients blood sugars tend to run high in the morning, recommended that daughter consults with PCP if continues. Patient and daughter did not have any further needs of concerns at this time for palliative team, Will  continue to follow.   3. PATIENT/CAREGIVER EDUCATION/ COPING:  Patient was alert and oriented and engaged in visit/conversation. Family is supportive. Patient enjoys sitting outside. 4. PERSONAL EMERGENCY PLAN: Family will call 9-1-1 if emergency. Patient also has a LifeAlert necklace. 5. COMMUNITY RESOURCES COORDINATION/ HEALTH CARE NAVIGATION:  Family assist with needs. Daughter and SW attempted to outreach patients leasing office in regard to requesting exterminator services for the home, due to patients complaint of bugs.Patient has CAP aide from 8AM-2PM, the agency [place].  6. FINANCIAL/LEGAL CONCERNS/INTERVENTIONS:  Patient has Medicaid, and is enrolled in CAP.     SOCIAL HX:  Social History   Tobacco Use  . Smoking status: Never Smoker  . Smokeless tobacco: Never Used  Substance Use Topics  . Alcohol use: No    Alcohol/week: 0.0 standard drinks    CODE STATUS: FULL CODE ADVANCED DIRECTIVES: Y - was told daughter has HCPOA MOST FORM COMPLETE:  N HOSPICE EDUCATION PROVIDED: N  HXT:[id number] is ambulating with rollator and standby assist. Patient needs assistance with bathing and dressing, and meals.   Time Spent: visit last 30 mins     [place], Pablo Pena

## 2019-08-06 NOTE — Progress Notes (Unsigned)
PATIENT NAME: Michelle Lopez DOB: [birth year] MRN: [id number]  PRIMARY CARE PROVIDER: Janith Lima, MD  RESPONSIBLE PARTY:  Acct ID - Guarantor Home Phone Work Phone Relationship Acct Type  [id number] - Michelle Lopez,[phone]  Self P/F     [address], [address], [place], [place] [address]    PLAN OF CARE and INTERVENTIONS:               1.  GOALS OF CARE/ ADVANCE CARE PLANNING:  Remain at home and remain as independent as possible.               2.  PATIENT/CAREGIVER EDUCATION:  Education on fall precautions, education on s/s of infection, reviewed meds, support               3.  DISEASE STATUS: SW [place] and RN made  scheduled palliative care home visit. Palliative care team met with patient and patients daughter Michelle Lopez. Patients CAP HHA was in the home but did not participate in visit. Patient standing at kitchen sink doing her PT exercises. Patient is no longer receiving Home Health Services. Daughter reports patient stayed with her other daughter  For 1 month.  Daughter Michelle Lopez cleaning the house and reports  insect bites patient has on her body we're not bed bug bites.  Per MD's note he was treating patient for bed bug bites. Daughter states insects are coming into the home from outside mulch. Patient reports bites itch.  Patient has healing bites on upper arms and legs. Patient currently rates pain at 8 on pain scale. Daughter states when patient needs something for pain she contacts Dr Michelle Lopez  office. Daughter states patient got out of her routine of exercising and feels like discomfort is due to patient resuming exercises since returning home. Daughter reports patients blood sugars have been elevated. Patient currently taking 20 units of insulin daily and daughter administers insulin during the visit. Patient denies suffering any recent falls.  Patient uses rollator walker when ambulating. CAP worker is in the home from 8 AM -  2 PM 7 days a week. Patient reports feeling weak. Patient  reports she does have some shortness of breath on exertion and a non productive cough. Patient reports she does not sleep well at night however daughter before she hears patient snoring. Patient does nap throughout the day. Patients vital signs are stable. Nurse reviewed patient's medications with daughter. Daughter states she is willing to have exterminator treat home however rental property will not agree for exterminator to come into home. Daughter states rental company does have exterminator visit and treat home periodically.  SW to follow up with rental office on exterminating concerns.  Patient remains a Full Code. Patient has no advance directives. Patients current weight is  150 pounds. Patient and daughter remain in agreement with palliative care services. Patient and daughter encouraged to contact palliative care with questions or concerns.     HISTORY OF PRESENT ILLNESS:  Patient is a 74 year old female who resides in her home.  Patient is followed by palliative care and is seen monthly and PRN.     CODE STATUS: Full Code  ADVANCED DIRECTIVES: N MOST FORM: No PPS: 50%   PHYSICAL EXAM:   VITALS: Today's Vitals   08/06/19 1054  BP: 128/66  Pulse: 60  Resp: 16  Temp: 98 F (36.7 C)  TempSrc: Temporal  SpO2: 98%  Weight: 150 lb (68 kg)  Height: 5' (1.524 m)  PainSc: 8  PainLoc: Leg    LUNGS: clear to auscultation  CARDIAC: Cor RRR  EXTREMITIES: none edema SKIN: multiple healing small bites on arms and legs.  Patient was given cream to apply to areas and areas are healing.    NEURO: positive for gait problems/ weakness       Michelle Simmer, RN

## 2019-08-07 ENCOUNTER — Other Ambulatory Visit: Payer: Self-pay | Admitting: Internal Medicine

## 2019-08-07 DIAGNOSIS — E1121 Type 2 diabetes mellitus with diabetic nephropathy: Secondary | ICD-10-CM

## 2019-08-07 DIAGNOSIS — E785 Hyperlipidemia, unspecified: Secondary | ICD-10-CM

## 2019-08-07 MED ORDER — ATORVASTATIN CALCIUM 20 MG PO TABS: 20.0000 mg | ORAL_TABLET | Freq: Every day | ORAL | 1 refills | Status: DC

## 2019-08-19 ENCOUNTER — Telehealth: Payer: Self-pay

## 2019-08-19 NOTE — Telephone Encounter (Signed)
NA

## 2019-08-19 NOTE — Telephone Encounter (Signed)
Telephone call to schedule palliative care visit.  RN LM requesting call back to schedule palliative care visit. 

## 2019-08-29 ENCOUNTER — Other Ambulatory Visit: Payer: Self-pay | Admitting: Internal Medicine

## 2019-08-29 DIAGNOSIS — I10 Essential (primary) hypertension: Secondary | ICD-10-CM

## 2019-09-02 ENCOUNTER — Ambulatory Visit: Payer: Medicare Other

## 2019-09-04 ENCOUNTER — Ambulatory Visit: Payer: Medicare Other | Attending: Internal Medicine

## 2019-09-04 DIAGNOSIS — Z23 Encounter for immunization: Secondary | ICD-10-CM

## 2019-09-04 NOTE — Progress Notes (Signed)
   Covid-19 Vaccination Clinic  Name:  Tennelle Taflinger    MRN: [id number] DOB: Feb 24, 1945  09/04/2019  Ms. Schnurr was observed post Covid-19 immunization for 15 minutes without incident. She was provided with Vaccine Information Sheet and instruction to access the V-Safe system.   Ms. Arps was instructed to call 911 with any severe reactions post vaccine: Marland Kitchen Difficulty breathing  . Swelling of face and throat  . A fast heartbeat  . A bad rash all over body  . Dizziness and weakness   Immunizations Administered    Name Date Dose VIS Date Route   Pfizer COVID-19 Vaccine 09/04/2019  2:08 PM 0.3 mL 04/02/2018 Intramuscular   Manufacturer: [place]   Lot: [id number]   [place]: [id number]

## 2019-09-10 ENCOUNTER — Other Ambulatory Visit: Payer: Self-pay | Admitting: Internal Medicine

## 2019-09-10 DIAGNOSIS — E1121 Type 2 diabetes mellitus with diabetic nephropathy: Secondary | ICD-10-CM

## 2019-09-29 ENCOUNTER — Other Ambulatory Visit: Payer: Self-pay | Admitting: Internal Medicine

## 2019-09-29 DIAGNOSIS — I10 Essential (primary) hypertension: Secondary | ICD-10-CM

## 2019-09-29 MED ORDER — TAMSULOSIN HCL 0.4 MG PO CAPS: 0.4000 mg | ORAL_CAPSULE | Freq: Every day | ORAL | 1 refills | Status: DC

## 2019-10-07 ENCOUNTER — Ambulatory Visit: Payer: Medicare Other | Attending: Internal Medicine

## 2019-10-07 DIAGNOSIS — Z23 Encounter for immunization: Secondary | ICD-10-CM

## 2019-10-07 NOTE — Progress Notes (Signed)
   Covid-19 Vaccination Clinic  Name:  Nisreen Guise    MRN: [id number] DOB: [birth year]  10/07/2019  Ms. Tardiff was observed post Covid-19 immunization for 15 minutes without incident. She was provided with Vaccine Information Sheet and instruction to access the V-Safe system.   Ms. Prind[place]le was instructed to call 911 with any severe reactions post vaccine: Marland Kitchen Difficulty breathing  . Swelling of face and throat  . A fast heartbeat  . A bad rash all over body  . Dizziness and weakness   Immunizations Administered    Name Date Dose VIS Date Route   Pfizer COVID-19 Vaccine 10/07/2019  1:39 PM 0.3 mL 04/02/2018 Intramuscular   Manufacturer: [place]   Lot: [id number]   [place]: [id number]

## 2019-10-28 ENCOUNTER — Other Ambulatory Visit: Payer: Self-pay | Admitting: Internal Medicine

## 2019-10-28 DIAGNOSIS — I739 Peripheral vascular disease, unspecified: Secondary | ICD-10-CM

## 2019-10-28 DIAGNOSIS — I10 Essential (primary) hypertension: Secondary | ICD-10-CM

## 2019-10-28 DIAGNOSIS — E785 Hyperlipidemia, unspecified: Secondary | ICD-10-CM

## 2019-10-28 DIAGNOSIS — E1121 Type 2 diabetes mellitus with diabetic nephropathy: Secondary | ICD-10-CM

## 2019-11-03 ENCOUNTER — Telehealth: Payer: Self-pay

## 2019-11-03 NOTE — Telephone Encounter (Signed)
140 pm.  Phone call made to patient to follow up on overall status.  No answer but message was left on VM.  PLAN:  Awaiting call back.  If no call back received I will reach out to patient again in October.

## 2019-11-21 ENCOUNTER — Telehealth: Payer: Self-pay

## 2019-11-21 NOTE — Telephone Encounter (Signed)
915 am.  Phone call made to [facility] of patient.  Per daughter patient is doing fine in her apartment.  Another daughter is with patient and assisting with care due to Mariam's work schedule.  Patient is eating 2 meals a day although Mariam states one meal is a "bird meal".  She does not believe there has been any weight loss.  When asking about pain Mariam asked that I contact patient directly as she does report pain but no further details are noted.  [name] has given me the # [phone] for patient.  921 am.  Phone call made to patient.  She is receptive to telephonic visit.  Patient reports tooth and leg pain.  She currently has a tooth that has broken in half.  I asked about seeing a dentist and patient states her daughters will work on setting an appointment up.  There have been no falls reported.  Patient is using a walker in the home.  She reports some vertigo issues that are long-standing.  Education provided on safety and importance of using the walker.  Blood sugars are being monitored by patient's daughter and she believes they are okay and fluctuating but she is unable to give me a range.  Medications are also being managed by her daughter.  Patient denies any shortness of breath or wheezing.  At this time, patient believes she is doing well.  She voiced appreciation for the follow up call.  I advised that the Palliative Care team would contact her again next month in hopes of doing an in-person visit. No other concerns voiced at this time.

## 2019-11-22 ENCOUNTER — Other Ambulatory Visit: Payer: Self-pay | Admitting: Internal Medicine

## 2019-11-22 DIAGNOSIS — E1121 Type 2 diabetes mellitus with diabetic nephropathy: Secondary | ICD-10-CM

## 2019-11-22 DIAGNOSIS — E785 Hyperlipidemia, unspecified: Secondary | ICD-10-CM

## 2019-11-22 MED ORDER — ATORVASTATIN CALCIUM 20 MG PO TABS: 20.0000 mg | ORAL_TABLET | Freq: Every day | ORAL | 1 refills | Status: DC

## 2019-11-24 ENCOUNTER — Other Ambulatory Visit: Payer: Self-pay | Admitting: Internal Medicine

## 2019-12-17 ENCOUNTER — Other Ambulatory Visit: Payer: Self-pay | Admitting: Internal Medicine

## 2019-12-17 DIAGNOSIS — E785 Hyperlipidemia, unspecified: Secondary | ICD-10-CM

## 2019-12-17 DIAGNOSIS — E1121 Type 2 diabetes mellitus with diabetic nephropathy: Secondary | ICD-10-CM

## 2019-12-17 MED ORDER — ATORVASTATIN CALCIUM 20 MG PO TABS: 20.0000 mg | ORAL_TABLET | Freq: Every day | ORAL | 1 refills | Status: DC

## 2019-12-22 ENCOUNTER — Telehealth: Payer: Self-pay

## 2019-12-22 NOTE — Telephone Encounter (Signed)
10:37AM: Palliative care SW outreached patient to complete telephonic visit.   Palliative care SW outreached patient to complete telephonic visit. Patient provided update on medical condition and/or changes. Patient shared that she had an dentist appointment last week and was told that needed 7 teeth pulled and have to schedule to see a dentist that can do the extractions, patient shared that she will have her daughter set up an appointment.  Patient no other pain other than her toothache and she was prescribed pain medication she can take for this. No recent falls. Patient shares that she is not a big eater and eats when she is hungry. She has not loss any weight that she can tell. Patient sleeping well. Patient expressed concern around her new insulin shot that she has to take daily and feels that she needs a nurse that can come give it to her every day. SW advised that there are n ot any nursing services covered by insurance that will allow a RN to come to her home everyday for medication management, possibly once a week. Patient stated that would do her no good. Patient stated that her daughter who lives with her does give her the insulin shot when she is home from work but the times are not consistent. Patient has private caregivers during the day through [place] but they are not able to administer medications, per patient. No other psychosocial needs. Patient appreciative of telephonic check in.   Plan: Palliative care will continue to monitor and assist with long term care planning as needed.

## 2020-01-16 ENCOUNTER — Telehealth: Payer: Self-pay

## 2020-01-16 NOTE — Telephone Encounter (Signed)
950 am.  Phone call made to patient's cell and daughter's cell to follow up on patient's overall condition.  No answer on either line.  I have left messages requesting a call back.  PLAN:  Awaiting call back from patient or daughter.  I will reach out to patient in the next couple of week if I do not receive a call back.

## 2020-01-19 ENCOUNTER — Other Ambulatory Visit: Payer: Self-pay | Admitting: Internal Medicine

## 2020-01-19 DIAGNOSIS — I10 Essential (primary) hypertension: Secondary | ICD-10-CM

## 2020-02-03 ENCOUNTER — Telehealth: Payer: Self-pay | Admitting: Internal Medicine

## 2020-02-03 NOTE — Telephone Encounter (Signed)
Copied from [place] [id number]. Topic: Medicare AWV >> Feb 03, 2020  9:48 AM Cher Nakai R wrote: Reason for CRM:   Left message for patient to call back and schedule Medicare Annual Wellness Visit (AWV) in office.   If not able to come in office, please offer to do virtually.  45 minute appointment  Last AWV 08/27/2018  Please schedule at anytime with the Nurse Health Advisor.

## 2020-02-04 ENCOUNTER — Other Ambulatory Visit: Payer: Self-pay | Admitting: Internal Medicine

## 2020-02-04 ENCOUNTER — Telehealth: Payer: Self-pay | Admitting: Internal Medicine

## 2020-02-04 DIAGNOSIS — E1121 Type 2 diabetes mellitus with diabetic nephropathy: Secondary | ICD-10-CM

## 2020-02-04 MED ORDER — TOUJEO MAX SOLOSTAR 300 UNIT/ML ~~LOC~~ SOPN: 20.0000 [IU] | PEN_INJECTOR | Freq: Every day | SUBCUTANEOUS | 0 refills | Status: DC

## 2020-02-04 NOTE — Telephone Encounter (Signed)
   Patient has no insulin remaining Please refill insulin glargine, 2 Unit Dial, (TOUJEO MAX SOLOSTAR) 300 UNIT/ML Solostar Pen to [place], [place] - [address]

## 2020-02-20 ENCOUNTER — Telehealth: Payer: Self-pay

## 2020-02-20 NOTE — Telephone Encounter (Signed)
1:04PM: Palliative care SW outreached patient/family for monthly telephonic visit and to schedule in home visit.  Call unsuccessful.  SW LVM. Awaiting return call. Will continue to offer palliative care support.

## 2020-02-24 ENCOUNTER — Other Ambulatory Visit: Payer: Self-pay | Admitting: Internal Medicine

## 2020-02-24 DIAGNOSIS — I10 Essential (primary) hypertension: Secondary | ICD-10-CM

## 2020-03-10 ENCOUNTER — Ambulatory Visit: Payer: Medicare Other

## 2020-03-11 ENCOUNTER — Other Ambulatory Visit: Payer: Self-pay | Admitting: Internal Medicine

## 2020-03-15 ENCOUNTER — Other Ambulatory Visit: Payer: Self-pay | Admitting: Internal Medicine

## 2020-03-15 DIAGNOSIS — E785 Hyperlipidemia, unspecified: Secondary | ICD-10-CM

## 2020-03-15 DIAGNOSIS — E1121 Type 2 diabetes mellitus with diabetic nephropathy: Secondary | ICD-10-CM

## 2020-03-15 DIAGNOSIS — I10 Essential (primary) hypertension: Secondary | ICD-10-CM

## 2020-03-15 DIAGNOSIS — I739 Peripheral vascular disease, unspecified: Secondary | ICD-10-CM

## 2020-03-17 ENCOUNTER — Other Ambulatory Visit: Payer: Self-pay | Admitting: Internal Medicine

## 2020-03-17 DIAGNOSIS — I10 Essential (primary) hypertension: Secondary | ICD-10-CM

## 2020-03-19 ENCOUNTER — Ambulatory Visit (INDEPENDENT_AMBULATORY_CARE_PROVIDER_SITE_OTHER): Payer: Medicare Other

## 2020-03-19 ENCOUNTER — Other Ambulatory Visit: Payer: Self-pay

## 2020-03-19 VITALS — BP 171/80 | HR 94 | Temp 98.3°F | Ht 60.0 in | Wt 150.4 lb

## 2020-03-19 DIAGNOSIS — Z0000 Encounter for general adult medical examination without abnormal findings: Secondary | ICD-10-CM

## 2020-03-19 NOTE — Progress Notes (Signed)
Subjective:   Michelle Lopez is a 75 y.o. female who presents for Medicare Annual (Subsequent) preventive examination.  Review of Systems    No ROS. Medicare Wellness Visit. Cardiac Risk Factors include: diabetes mellitus;hypertension;advanced age (>44mn, >>80women);dyslipidemia     Objective:    Today's Vitals   03/19/20 1108  BP: (!) 171/80  Pulse: 94  Temp: 98.3 F (36.8 C)  SpO2: 93%  Weight: 150 lb 6.4 oz (68.2 kg)  Height: 5' (1.524 m)  PainSc: 0-No pain   Body mass index is 29.37 kg/m.  Advanced Directives 03/19/2020 04/03/2019 12/30/2018 08/27/2018 03/18/2018 08/22/2016 08/15/2016  Does Patient Have a Medical Advance Directive? Yes Yes Yes Yes No Yes Yes  Type of Advance Directive Living will;Healthcare Power of [place] - Healthcare Power of [place] Does patient want to make changes to medical advance directive? No - Patient declined No - Patient declined No - Guardian declined - - No - Patient declined No - Patient declined  Copy of [place]? No - copy requested No - copy requested No - copy requested No - copy requested - Yes -  Would patient like information on creating a medical advance directive? - - - - No - Patient declined - -  Pre-existing out of facility DNR order (yellow form or pink MOST form) - - - - - - -    Current Medications (verified) Outpatient Encounter Medications as of 03/19/2020  Medication Sig  . insulin aspart (NOVOLOG) 100 UNIT/ML FlexPen Inject 0-9 Units into the skin 3 (three) times daily with meals.  .Marland Kitchenaspirin 81 MG tablet Take 81 mg by mouth daily.    .Marland Kitchenatorvastatin (LIPITOR) 20 MG tablet Take 1 tablet (20 mg total) by mouth daily.  . Blood Glucose Monitoring Suppl (CONTOUR NEXT EZ) w/Device KIT 1 Act by Does not apply route 2 (two) times daily.  .Marland KitchenCLICKFINE PEN NEEDLES 31G X 6 MM  MISC USE TO INJECT insulin UP TO 5 TIMES DAILY  . clopidogrel (PLAVIX) 75 MG tablet TAKE 1 TABLET BY MOUTH EVERY DAY  . glucose blood (ONETOUCH VERIO) test strip Use to check blood sugars twice a day  . insulin glargine, 2 Unit Dial, (TOUJEO MAX SOLOSTAR) 300 UNIT/ML Solostar Pen Inject 20 Units into the skin daily.  . Lancets (ONETOUCH ULTRASOFT) lancets Use to check blood sugars twice a day  . metoprolol tartrate (LOPRESSOR) 25 MG tablet TAKE 1/2 TABLET BY MOUTH 2 TIMES DAILY  . RESTASIS 0.05 % ophthalmic emulsion Place 1 drop into both eyes daily.  . tamsulosin (FLOMAX) 0.4 MG CAPS capsule TAKE 1 CAPSULE BY MOUTH EVERY DAY  . Vitamin D, Ergocalciferol, (DRISDOL) 1.25 MG (50000 UNIT) CAPS capsule TAKE 1 CAPSULE BY MOUTH once weekly   No facility-administered encounter medications on file as of 03/19/2020.    Allergies (verified) Amlodipine, Crestor [rosuvastatin calcium], Morphine, Pravastatin sodium, and Simvastatin   History: Past Medical History:  Diagnosis Date  . Cerebrovascular disease, unspecified   . Cocaine abuse, unspecified   . Diabetes mellitus    type 2, uncontrolled  . Diarrhea   . Essential hypertension, benign   . Hyperlipidemia   . Stroke ([facility]    x7   Past Surgical History:  Procedure Laterality Date  . TOTAL HIP ARTHROPLASTY  1970   Dr. MRodell Perna  Family History  Problem Relation Age of  Onset  . Colon cancer Neg Hx    Social History   Socioeconomic History  . Marital status: Divorced    Spouse name: Not on file  . Number of children: 4  . Years of education: Not on file  . Highest education level: Not on file  Occupational History  . Occupation: Disability  Tobacco Use  . Smoking status: Never Smoker  . Smokeless tobacco: Never Used  Vaping Use  . Vaping Use: Never used  Substance and Sexual Activity  . Alcohol use: No    Alcohol/week: 0.0 standard drinks  . Drug use: No  . Sexual activity: Not Currently  Other Topics Concern  . Not on  file  Social History Narrative   Daily caffeine    Social Determinants of Health   Financial Resource Strain: Low Risk   . Difficulty of Paying Living Expenses: Not hard at all  Food Insecurity: No Food Insecurity  . Worried About Charity fundraiser in the Last Year: Never true  . Ran Out of Food in the Last Year: Never true  Transportation Needs: No Transportation Needs  . Lack of Transportation (Medical): No  . Lack of Transportation (Non-Medical): No  Physical Activity: Inactive  . Days of Exercise per Week: 0 days  . Minutes of Exercise per Session: 0 min  Stress: No Stress Concern Present  . Feeling of Stress : Not at all  Social Connections: Socially Isolated  . Frequency of Communication with Friends and Family: More than three times a week  . Frequency of Social Gatherings with Friends and Family: More than three times a week  . Attends Religious Services: Never  . Active Member of Clubs or Organizations: No  . Attends Archivist Meetings: Never  . Marital Status: Never married    Tobacco Counseling Counseling given: Not Answered   Clinical Intake:  Pre-visit preparation completed: Yes  Pain : No/denies pain Pain Score: 0-No pain     BMI - recorded: 29.37 Nutritional Status: BMI 25 -29 Overweight Nutritional Risks: None Diabetes: Yes CBG done?: Yes (117) CBG resulted in Enter/ Edit results?: Yes Did pt. bring in CBG monitor from home?: No  How often do you need to have someone help you when you read instructions, pamphlets, or other written materials from your doctor or pharmacy?: 1 - Never What is the last grade level you completed in school?: HSG  Diabetic? yes  Interpreter Needed?: No  Information entered by :: Lisette Abu, LPN   Activities of Daily Living In your present state of health, do you have any difficulty performing the following activities: 03/19/2020 04/03/2019  Hearing? N Y  Vision? N N  Difficulty concentrating or  making decisions? N N  Walking or climbing stairs? Y Y  Dressing or bathing? Y Y  Doing errands, shopping? [name]  Preparing Food and eating ? Y -  Using the Toilet? Y -  In the past six months, have you accidently leaked urine? Y -  Do you have problems with loss of bowel control? N -  Managing your Medications? Y -  Managing your Finances? Y -  Housekeeping or managing your Housekeeping? Y -  Some recent data might be hidden    Patient Care Team: Janith Lima, MD as PCP - General  Indicate any recent Medical Services you may have received from other than [facility] providers in the past year (date may be approximate).     Assessment:   This is a  routine wellness examination for Tayja.  Hearing/Vision screen No exam data present  Dietary issues and exercise activities discussed: Current Exercise Habits: The patient does not participate in regular exercise at present, Exercise limited by: cardiac condition(s);neurologic condition(s)  Goals    . Patient Stated     Increase the amount of water and healthy fluid I drink. I will set out 2-3 bottles of water daily and drink them and will also drink sugar free Gatorade to stay hydrated.       Depression Screen PHQ 2/9 Scores 03/19/2020 12/30/2018 08/27/2018 08/01/2018 08/22/2016 08/15/2016 07/07/2015  PHQ - 2 Score 0 0 0 - 1 1 0  Exception Documentation - - - Medical reason Medical reason - -    Fall Risk Fall Risk  03/19/2020 02/17/2019 12/30/2018 08/27/2018 08/01/2018  Falls in the past year? 0 0 0 0 0  Number falls in past yr: 0 - - 0 0  Injury with Fall? 0 - - - 0  Risk Factor Category  - - - - -  Risk for fall due to : Impaired balance/gait - - Mental status change;Impaired balance/gait;Impaired mobility Impaired balance/gait;Mental status change;Impaired mobility  Follow up - - - Falls prevention discussed Falls evaluation completed;Education provided;Falls prevention discussed    FALL RISK PREVENTION PERTAINING TO THE HOME:  Any  stairs in or around the home? No  If so, are there any without handrails? No  Home free of loose throw rugs in walkways, pet beds, electrical cords, etc? Yes  Adequate lighting in your home to reduce risk of falls? Yes   ASSISTIVE DEVICES UTILIZED TO PREVENT FALLS:  Life alert? Yes  Use of a cane, walker or w/c? Yes  Grab bars in the bathroom? Yes  Shower chair or bench in shower? Yes  Elevated toilet seat or a handicapped toilet? Yes   TIMED UP AND GO:  Was the test performed? No .  Length of time to ambulate 10 feet: 0 sec.   Gait slow and steady with assistive device   Cognitive Function: Normal cognitive status assessed by direct observation by this Nurse Health Advisor. No abnormalities found.   MMSE - Mini Mental State Exam 08/27/2018  Not completed: (No Data)        Immunizations Immunization History  Administered Date(s) Administered  . Fluad Quad(high Dose 65+) 01/07/2019  . Influenza Whole 01/14/2009, 11/16/2009  . Influenza, High Dose Seasonal PF 12/14/2014, 11/27/2016, 10/17/2017  . Influenza,inj,Quad PF,6+ Mos 10/31/2013  . Influenza-Unspecified 12/07/2012  . PFIZER(Purple Top)SARS-COV-2 Vaccination 09/04/2019, 10/07/2019  . Pneumococcal Conjugate-13 06/23/2014  . Pneumococcal Polysaccharide-23 12/24/2008, 08/23/2017  . Td 11/16/2009  . Zoster 10/03/2012    TDAP status: Due, Education has been provided regarding the importance of this vaccine. Advised may receive this vaccine at local pharmacy or Health Dept. Aware to provide a copy of the vaccination record if obtained from local pharmacy or Health Dept. Verbalized acceptance and understanding.  Flu Vaccine status: Due, Education has been provided regarding the importance of this vaccine. Advised may receive this vaccine at local pharmacy or Health Dept. Aware to provide a copy of the vaccination record if obtained from local pharmacy or Health Dept. Verbalized acceptance and understanding.  Pneumococcal  vaccine status: Up to date  Covid-19 vaccine status: Completed vaccines  Qualifies for Shingles Vaccine? Yes   Zostavax completed Yes   Shingrix Completed?: No.    Education has been provided regarding the importance of this vaccine. Patient has been advised to call insurance company to  determine out of pocket expense if they have not yet received this vaccine. Advised may also receive vaccine at local pharmacy or Health Dept. Verbalized acceptance and understanding.  Screening Tests Health Maintenance  Topic Date Due  . FOOT EXAM  07/31/2019  . URINE MICROALBUMIN  07/31/2019  . INFLUENZA VACCINE  09/07/2019  . TETANUS/TDAP  11/17/2019  . HEMOGLOBIN A1C  01/17/2020  . COVID-19 Vaccine (3 - Booster for Pfizer series) 04/05/2020  . DEXA SCAN  Completed  . Hepatitis C Screening  Completed  . PNA vac Low Risk Adult  Completed    Health Maintenance  Health Maintenance Due  Topic Date Due  . FOOT EXAM  07/31/2019  . URINE MICROALBUMIN  07/31/2019  . INFLUENZA VACCINE  09/07/2019  . TETANUS/TDAP  11/17/2019  . HEMOGLOBIN A1C  01/17/2020    Colorectal cancer screening: No longer required.   Mammogram status: Completed 03/13/2014. Repeat every year  Bone Density status: Completed 03/24/2014. Results reflect: Bone density results: OSTEOPENIA. Repeat every 2 years.  Lung Cancer Screening: (Low Dose CT Chest recommended if Age 57-80 years, 30 pack-year currently smoking OR have quit w/in 15years.) does not qualify.   Lung Cancer Screening Referral: no  Additional Screening:  Hepatitis C Screening: does qualify; Completed yes  Vision Screening: Recommended annual ophthalmology exams for early detection of glaucoma and other disorders of the eye. Is the patient up to date with their annual eye exam?  Yes  Who is the provider or what is the name of the office in which the patient attends annual eye exams? [facility] [place] If pt is not established  with a provider, would they like to be referred to a provider to establish care? No .   Dental Screening: Recommended annual dental exams for proper oral hygiene  Community Resource Referral / Chronic Care Management: CRR required this visit?  No   CCM required this visit?  No      Plan:     I have personally reviewed and noted the following in the patient's chart:   . Medical and social history . Use of alcohol, tobacco or illicit drugs  . Current medications and supplements . Functional ability and status . Nutritional status . Physical activity . Advanced directives . List of other physicians . Hospitalizations, surgeries, and ER visits in previous 12 months . Vitals . Screenings to include cognitive, depression, and falls . Referrals and appointments  In addition, I have reviewed and discussed with patient certain preventive protocols, quality metrics, and best practice recommendations. A written personalized care plan for preventive services as well as general preventive health recommendations were provided to patient.     Sheral Flow, LPN   [id number]   Nurse Notes: n/a

## 2020-03-19 NOTE — Patient Instructions (Addendum)
Michelle Lopez , Thank you for taking time to come for your Medicare Wellness Visit. I appreciate your ongoing commitment to your health goals. Please review the following plan we discussed and let me know if I can assist you in the future.   Screening recommendations/referrals: Colonoscopy: not a candidate Mammogram: last done 03/13/2014 Bone Density: last done 03/24/2014 Recommended yearly ophthalmology/optometry visit for glaucoma screening and checkup Recommended yearly dental visit for hygiene and checkup  Vaccinations: Influenza vaccine: 01/07/2019 Pneumococcal vaccine: up to date Tdap vaccine: 11/16/2009; due every 10 years Shingles vaccine: never done Covid-19: up to date  Advanced directives: Please bring a copy of your health care power of attorney and living will to the office at your convenience.  Conditions/risks identified: Yes. Reviewed health maintenance screenings with patient today and relevant education, vaccines, and/or referrals were provided. Continue doing brain stimulating activities (puzzles, reading, adult coloring books, staying active) to keep memory sharp. Continue to eat heart healthy diet (full of fruits, vegetables, whole grains, lean protein, water--limit salt, fat, and sugar intake) and increase physical activity as tolerated.  Next appointment: Please schedule your next Medicare Wellness Visit with your Nurse Health Advisor in 1 year by calling [phone].   Preventive Care 77 Years and Older, Female Preventive care refers to lifestyle choices and visits with your health care provider that can promote health and wellness. What does preventive care include?  A yearly physical exam. This is also called an annual well check.  Dental exams once or twice a year.  Routine eye exams. Ask your health care provider how often you should have your eyes checked.  Personal lifestyle choices, including:  Daily care of your teeth and gums.  Regular physical  activity.  Eating a healthy diet.  Avoiding tobacco and drug use.  Limiting alcohol use.  Practicing safe sex.  Taking low-dose aspirin every day.  Taking vitamin and mineral supplements as recommended by your health care provider. What happens during an annual well check? The services and screenings done by your health care provider during your annual well check will depend on your age, overall health, lifestyle risk factors, and family history of disease. Counseling  Your health care provider may ask you questions about your:  Alcohol use.  Tobacco use.  Drug use.  Emotional well-being.  Home and relationship well-being.  Sexual activity.  Eating habits.  History of falls.  Memory and ability to understand (cognition).  Work and work Statistician.  Reproductive health. Screening  You may have the following tests or measurements:  Height, weight, and BMI.  Blood pressure.  Lipid and cholesterol levels. These may be checked every 5 years, or more frequently if you are over 29 years old.  Skin check.  Lung cancer screening. You may have this screening every year starting at age 64 if you have a 30-pack-year history of smoking and currently smoke or have quit within the past 15 years.  Fecal occult blood test (FOBT) of the stool. You may have this test every year starting at age 34.  Flexible sigmoidoscopy or colonoscopy. You may have a sigmoidoscopy every 5 years or a colonoscopy every 10 years starting at age 55.  Hepatitis C blood test.  Hepatitis B blood test.  Sexually transmitted disease (STD) testing.  Diabetes screening. This is done by checking your blood sugar (glucose) after you have not eaten for a while (fasting). You may have this done every 1-3 years.  Bone density scan. This is done to screen for  osteoporosis. You may have this done starting at age 10.  Mammogram. This may be done every 1-2 years. Talk to your health care provider about  how often you should have regular mammograms. Talk with your health care provider about your test results, treatment options, and if necessary, the need for more tests. Vaccines  Your health care provider may recommend certain vaccines, such as:  Influenza vaccine. This is recommended every year.  Tetanus, diphtheria, and acellular pertussis (Tdap, Td) vaccine. You may need a Td booster every 10 years.  Zoster vaccine. You may need this after age 65.  Pneumococcal 13-valent conjugate (PCV13) vaccine. One dose is recommended after age 65.  Pneumococcal polysaccharide (PPSV23) vaccine. One dose is recommended after age 71. Talk to your health care provider about which screenings and vaccines you need and how often you need them. This information is not intended to replace advice given to you by your health care provider. Make sure you discuss any questions you have with your health care provider. Document Released: 02/19/2015 Document Revised: 10/13/2015 Document Reviewed: 11/24/2014 Elsevier Interactive Patient Education  2017 [employer] Prevention in the Home Falls can cause injuries. They can happen to people of all ages. There are many things you can do to make your home safe and to help prevent falls. What can I do on the outside of my home?  Regularly fix the edges of walkways and driveways and fix any cracks.  Remove anything that might make you trip as you walk through a door, such as a raised step or threshold.  Trim any bushes or trees on the path to your home.  Use bright outdoor lighting.  Clear any walking paths of anything that might make someone trip, such as rocks or tools.  Regularly check to see if handrails are loose or broken. Make sure that both sides of any steps have handrails.  Any raised decks and porches should have guardrails on the edges.  Have any leaves, snow, or ice cleared regularly.  Use sand or salt on walking paths during  winter.  Clean up any spills in your garage right away. This includes oil or grease spills. What can I do in the bathroom?  Use night lights.  Install grab bars by the toilet and in the tub and shower. Do not use towel bars as grab bars.  Use non-skid mats or decals in the tub or shower.  If you need to sit down in the shower, use a plastic, non-slip stool.  Keep the floor dry. Clean up any water that spills on the floor as soon as it happens.  Remove soap buildup in the tub or shower regularly.  Attach bath mats securely with double-sided non-slip rug tape.  Do not have throw rugs and other things on the floor that can make you trip. What can I do in the bedroom?  Use night lights.  Make sure that you have a light by your bed that is easy to reach.  Do not use any sheets or blankets that are too big for your bed. They should not hang down onto the floor.  Have a firm chair that has side arms. You can use this for support while you get dressed.  Do not have throw rugs and other things on the floor that can make you trip. What can I do in the kitchen?  Clean up any spills right away.  Avoid walking on wet floors.  Keep items that you use  a lot in easy-to-reach places.  If you need to reach something above you, use a strong step stool that has a grab bar.  Keep electrical cords out of the way.  Do not use floor polish or wax that makes floors slippery. If you must use wax, use non-skid floor wax.  Do not have throw rugs and other things on the floor that can make you trip. What can I do with my stairs?  Do not leave any items on the stairs.  Make sure that there are handrails on both sides of the stairs and use them. Fix handrails that are broken or loose. Make sure that handrails are as long as the stairways.  Check any carpeting to make sure that it is firmly attached to the stairs. Fix any carpet that is loose or worn.  Avoid having throw rugs at the top or  bottom of the stairs. If you do have throw rugs, attach them to the floor with carpet tape.  Make sure that you have a light switch at the top of the stairs and the bottom of the stairs. If you do not have them, ask someone to add them for you. What else can I do to help prevent falls?  Wear shoes that:  Do not have high heels.  Have rubber bottoms.  Are comfortable and fit you well.  Are closed at the toe. Do not wear sandals.  If you use a stepladder:  Make sure that it is fully opened. Do not climb a closed stepladder.  Make sure that both sides of the stepladder are locked into place.  Ask someone to hold it for you, if possible.  Clearly mark and make sure that you can see:  Any grab bars or handrails.  First and last steps.  Where the edge of each step is.  Use tools that help you move around (mobility aids) if they are needed. These include:  Canes.  Walkers.  Scooters.  Crutches.  Turn on the lights when you go into a dark area. Replace any light bulbs as soon as they burn out.  Set up your furniture so you have a clear path. Avoid moving your furniture around.  If any of your floors are uneven, fix them.  If there are any pets around you, be aware of where they are.  Review your medicines with your doctor. Some medicines can make you feel dizzy. This can increase your chance of falling. Ask your doctor what other things that you can do to help prevent falls. This information is not intended to replace advice given to you by your health care provider. Make sure you discuss any questions you have with your health care provider. Document Released: 11/19/2008 Document Revised: 07/01/2015 Document Reviewed: 02/27/2014 Elsevier Interactive Patient Education  2017 [employer].

## 2020-03-22 ENCOUNTER — Other Ambulatory Visit: Payer: Self-pay | Admitting: Internal Medicine

## 2020-03-22 DIAGNOSIS — I10 Essential (primary) hypertension: Secondary | ICD-10-CM

## 2020-03-25 ENCOUNTER — Telehealth: Payer: Self-pay

## 2020-03-25 NOTE — Telephone Encounter (Signed)
150 pm.  Phone call made to patient to complete a telephonic visit.  No answer.  HIPPA compliant message has been left requesting a call back.  PLAN:  Awaiting call back.  If no call back, Palliative Care will outreach patient at a later date.

## 2020-04-12 ENCOUNTER — Other Ambulatory Visit: Payer: Self-pay | Admitting: Internal Medicine

## 2020-04-12 DIAGNOSIS — E1121 Type 2 diabetes mellitus with diabetic nephropathy: Secondary | ICD-10-CM

## 2020-04-12 DIAGNOSIS — I739 Peripheral vascular disease, unspecified: Secondary | ICD-10-CM

## 2020-04-12 DIAGNOSIS — E785 Hyperlipidemia, unspecified: Secondary | ICD-10-CM

## 2020-04-12 DIAGNOSIS — I10 Essential (primary) hypertension: Secondary | ICD-10-CM

## 2020-04-12 MED ORDER — ATORVASTATIN CALCIUM 20 MG PO TABS: 20.0000 mg | ORAL_TABLET | Freq: Every day | ORAL | 0 refills | Status: DC

## 2020-04-12 MED ORDER — METOPROLOL TARTRATE 25 MG PO TABS
12.5000 mg | ORAL_TABLET | Freq: Two times a day (BID) | ORAL | 0 refills | Status: DC
Start: 2020-04-12 — End: 2020-04-14

## 2020-04-12 MED ORDER — CLOPIDOGREL BISULFATE 75 MG PO TABS: 75.0000 mg | ORAL_TABLET | Freq: Every day | ORAL | 0 refills | Status: DC

## 2020-04-14 ENCOUNTER — Other Ambulatory Visit: Payer: Self-pay | Admitting: Internal Medicine

## 2020-04-14 ENCOUNTER — Encounter: Payer: Self-pay | Admitting: Internal Medicine

## 2020-04-14 ENCOUNTER — Other Ambulatory Visit: Payer: Self-pay

## 2020-04-14 ENCOUNTER — Ambulatory Visit (INDEPENDENT_AMBULATORY_CARE_PROVIDER_SITE_OTHER): Payer: Medicare Other | Admitting: Internal Medicine

## 2020-04-14 VITALS — BP 136/86 | HR 60 | Temp 98.1°F | Resp 16 | Ht 60.0 in | Wt 151.0 lb

## 2020-04-14 DIAGNOSIS — E1121 Type 2 diabetes mellitus with diabetic nephropathy: Secondary | ICD-10-CM

## 2020-04-14 DIAGNOSIS — E538 Deficiency of other specified B group vitamins: Secondary | ICD-10-CM

## 2020-04-14 DIAGNOSIS — E785 Hyperlipidemia, unspecified: Secondary | ICD-10-CM | POA: Diagnosis not present

## 2020-04-14 DIAGNOSIS — I739 Peripheral vascular disease, unspecified: Secondary | ICD-10-CM

## 2020-04-14 DIAGNOSIS — I10 Essential (primary) hypertension: Secondary | ICD-10-CM

## 2020-04-14 DIAGNOSIS — E441 Mild protein-calorie malnutrition: Secondary | ICD-10-CM | POA: Diagnosis not present

## 2020-04-14 DIAGNOSIS — N1832 Chronic kidney disease, stage 3b: Secondary | ICD-10-CM

## 2020-04-14 LAB — LIPID PANEL
Cholesterol: 162 mg/dL (ref 0–200)
HDL: 41.2 mg/dL (ref >39.00)
LDL Cholesterol: 89 mg/dL (ref 0–99)
NonHDL: 121.24
Total CHOL/HDL Ratio: 4
Triglycerides: 162 mg/dL — ABNORMAL HIGH (ref 0.0–149.0)
VLDL: 32.4 mg/dL (ref 0.0–40.0)

## 2020-04-14 LAB — URINALYSIS, ROUTINE W REFLEX MICROSCOPIC
Bilirubin Urine: NEGATIVE
Ketones, ur: NEGATIVE
Leukocytes,Ua: NEGATIVE
Nitrite: NEGATIVE
Specific Gravity, Urine: >=1.03 — AB (ref 1.000–1.030)
Total Protein, Urine: NEGATIVE
Urine Glucose: NEGATIVE
Urobilinogen, UA: 0.2 (ref 0.0–1.0)
pH: 5.5 (ref 5.0–8.0)

## 2020-04-14 LAB — CBC WITH DIFFERENTIAL/PLATELET
Basophils Absolute: 0.1 10*3/uL (ref 0.0–0.1)
Basophils Relative: 1.1 % (ref 0.0–3.0)
Eosinophils Absolute: 0.1 10*3/uL (ref 0.0–0.7)
Eosinophils Relative: 0.9 % (ref 0.0–5.0)
HCT: 44.8 % (ref 36.0–46.0)
Hemoglobin: 14.4 g/dL (ref 12.0–15.0)
Lymphocytes Relative: 47.5 % — ABNORMAL HIGH (ref 12.0–46.0)
Lymphs Abs: 3.4 10*3/uL (ref 0.7–4.0)
MCHC: 32.1 g/dL (ref 30.0–36.0)
MCV: 80.7 fl (ref 78.0–100.0)
Monocytes Absolute: 0.5 10*3/uL (ref 0.1–1.0)
Monocytes Relative: 7.5 % (ref 3.0–12.0)
Neutro Abs: 3.1 10*3/uL (ref 1.4–7.7)
Neutrophils Relative %: 43 % (ref 43.0–77.0)
Platelets: 225 10*3/uL (ref 150.0–400.0)
RBC: 5.55 Mil/uL — ABNORMAL HIGH (ref 3.87–5.11)
RDW: 13.1 % (ref 11.5–15.5)
WBC: 7.2 10*3/uL (ref 4.0–10.5)

## 2020-04-14 LAB — BASIC METABOLIC PANEL
BUN: 18 mg/dL (ref 6–23)
CO2: 30 mEq/L (ref 19–32)
Calcium: 9.4 mg/dL (ref 8.4–10.5)
Chloride: 103 mEq/L (ref 96–112)
Creatinine, Ser: 1.07 mg/dL (ref 0.40–1.20)
GFR: 50.22 mL/min — ABNORMAL LOW (ref >60.00)
Glucose, Bld: 136 mg/dL — ABNORMAL HIGH (ref 70–99)
Potassium: 3.8 mEq/L (ref 3.5–5.1)
Sodium: 140 mEq/L (ref 135–145)

## 2020-04-14 LAB — HEPATIC FUNCTION PANEL
ALT: 12 U/L (ref 0–35)
AST: 16 U/L (ref 0–37)
Albumin: 3.8 g/dL (ref 3.5–5.2)
Alkaline Phosphatase: 80 U/L (ref 39–117)
Bilirubin, Direct: 0.1 mg/dL (ref 0.0–0.3)
Total Bilirubin: 0.4 mg/dL (ref 0.2–1.2)
Total Protein: 8 g/dL (ref 6.0–8.3)

## 2020-04-14 LAB — FOLATE: Folate: 11.6 ng/mL (ref >5.9)

## 2020-04-14 LAB — TSH: TSH: 2.38 u[IU]/mL (ref 0.35–4.50)

## 2020-04-14 LAB — HEMOGLOBIN A1C: Hgb A1c MFr Bld: 8.1 % — ABNORMAL HIGH (ref 4.6–6.5)

## 2020-04-14 LAB — VITAMIN B12: Vitamin B-12: 226 pg/mL (ref 211–911)

## 2020-04-14 MED ORDER — CLOPIDOGREL BISULFATE 75 MG PO TABS
75.0000 mg | ORAL_TABLET | Freq: Every day | ORAL | 1 refills | Status: DC
Start: 2020-04-14 — End: 2020-04-19

## 2020-04-14 MED ORDER — METOPROLOL TARTRATE 25 MG PO TABS: 12.5000 mg | ORAL_TABLET | Freq: Two times a day (BID) | ORAL | 1 refills | Status: DC

## 2020-04-14 MED ORDER — DAPAGLIFLOZIN PROPANEDIOL 10 MG PO TABS
10.0000 mg | ORAL_TABLET | Freq: Every day | ORAL | 1 refills | Status: DC
Start: 2020-04-14 — End: 2021-05-02

## 2020-04-14 MED ORDER — ATORVASTATIN CALCIUM 20 MG PO TABS
20.0000 mg | ORAL_TABLET | Freq: Every day | ORAL | 1 refills | Status: DC
Start: 2020-04-14 — End: 2020-04-19

## 2020-04-14 NOTE — Progress Notes (Signed)
Subjective:  Patient ID: Michelle Lopez, female    DOB: 1945-04-02  Age: 75 y.o. MRN: [id number]  CC: Anemia, Hypertension, Diabetes, and Hyperlipidemia  This visit occurred during the SARS-CoV-2 public health emergency.  Safety protocols were in place, including screening questions prior to the visit, additional usage of staff PPE, and extensive cleaning of exam room while observing appropriate contact time as indicated for disinfecting solutions.    HPI [name] presents for f/up -  She has no new neuro complains. She thinks her blood sugars are pretty well controlled. She denies polys. She is gaining weight.   Outpatient Medications Prior to Visit  Medication Sig Dispense Refill  . aspirin 81 MG tablet Take 81 mg by mouth daily.    . Blood Glucose Monitoring Suppl (CONTOUR NEXT EZ) w/Device KIT 1 Act by Does not apply route 2 (two) times daily. 1 kit 0  . CLICKFINE PEN NEEDLES 31G X 6 MM MISC USE TO INJECT insulin UP TO 5 TIMES DAILY 300 each 1  . glucose blood (ONETOUCH VERIO) test strip Use to check blood sugars twice a day 300 each 1  . insulin aspart (NOVOLOG) 100 UNIT/ML FlexPen Inject 0-9 Units into the skin 3 (three) times daily with meals. 15 mL 0  . insulin glargine, 2 Unit Dial, (TOUJEO MAX SOLOSTAR) 300 UNIT/ML Solostar Pen Inject 20 Units into the skin daily. 6 mL 0  . Lancets (ONETOUCH ULTRASOFT) lancets Use to check blood sugars twice a day 300 each 1  . RESTASIS 0.05 % ophthalmic emulsion Place 1 drop into both eyes daily.    . tamsulosin (FLOMAX) 0.4 MG CAPS capsule TAKE 1 CAPSULE BY MOUTH EVERY DAY 30 capsule 1  . Vitamin D, Ergocalciferol, (DRISDOL) 1.25 MG (50000 UNIT) CAPS capsule TAKE 1 CAPSULE BY MOUTH once weekly 54 capsule 0  . atorvastatin (LIPITOR) 20 MG tablet Take 1 tablet (20 mg total) by mouth daily. 30 tablet 0  . clopidogrel (PLAVIX) 75 MG tablet Take 1 tablet (75 mg total) by mouth daily. 30 tablet 0  . metoprolol tartrate (LOPRESSOR) 25 MG tablet  Take 0.5 tablets (12.5 mg total) by mouth 2 (two) times daily. 30 tablet 0   No facility-administered medications prior to visit.    ROS Review of Systems  Constitutional: Positive for fatigue. Negative for appetite change, chills, diaphoresis, fever and unexpected weight change.  HENT: Negative.   Eyes: Negative for visual disturbance.  Respiratory: Negative for cough, chest tightness, shortness of breath and wheezing.   Cardiovascular: Negative for chest pain, palpitations and leg swelling.  Gastrointestinal: Negative for abdominal pain, diarrhea and nausea.  Endocrine: Negative for polydipsia, polyphagia and polyuria.  Genitourinary: Negative.  Negative for difficulty urinating.  Musculoskeletal: Negative.  Negative for arthralgias and myalgias.  Skin: Negative.  Negative for color change and pallor.  Neurological: Positive for dizziness and weakness. Negative for light-headedness and numbness.  Hematological: Negative for adenopathy. Does not bruise/bleed easily.  Psychiatric/Behavioral: Negative.     Objective:  BP 136/86   Pulse 60   Temp 98.1 F (36.7 C) (Oral)   Resp 16   Ht 5' (1.524 m)   Wt 151 lb (68.5 kg)   SpO2 98%   BMI 29.49 kg/m   BP Readings from Last 3 Encounters:  04/14/20 136/86  03/19/20 (!) 171/80  08/06/19 128/66    Wt Readings from Last 3 Encounters:  04/14/20 151 lb (68.5 kg)  03/19/20 150 lb 6.4 oz (68.2 kg)  08/06/19 150 lb (  68 kg)    Physical Exam Vitals reviewed.  HENT:     Right Ear: Tympanic membrane normal.     Nose: Nose normal.     Mouth/Throat:     Mouth: Mucous membranes are moist.  Eyes:     General: No scleral icterus.    Conjunctiva/sclera: Conjunctivae normal.  Cardiovascular:     Rate and Rhythm: Normal rate and regular rhythm.     Heart sounds: Normal heart sounds, S1 normal and S2 normal. No gallop.      Comments: EKG- NSR, 64 bpm LAD, LVH, low voltage ant/lateral - no changes from the prior EKG Pulmonary:      Effort: Pulmonary effort is normal.     Breath sounds: No stridor. No wheezing, rhonchi or rales.  Abdominal:     General: Abdomen is flat. Bowel sounds are normal. There is no distension.     Palpations: Abdomen is soft. There is no hepatomegaly, splenomegaly or mass.     Tenderness: There is no abdominal tenderness.  Musculoskeletal:     Cervical back: Neck supple.     Right lower leg: No edema.     Left lower leg: No edema.  Skin:    General: Skin is warm and dry.  Neurological:     Mental Status: Mental status is at baseline.  Psychiatric:        Mood and Affect: Mood normal.        Behavior: Behavior normal.     Lab Results  Component Value Date   WBC 7.2 04/14/2020   HGB 14.4 04/14/2020   HCT 44.8 04/14/2020   PLT 225.0 04/14/2020   GLUCOSE 136 (H) 04/14/2020   CHOL 162 04/14/2020   TRIG 162.0 (H) 04/14/2020   HDL 41.20 04/14/2020   LDLDIRECT 100.0 07/06/2015   LDLCALC 89 04/14/2020   ALT 12 04/14/2020   AST 16 04/14/2020   NA 140 04/14/2020   K 3.8 04/14/2020   CL 103 04/14/2020   CREATININE 1.07 04/14/2020   BUN 18 04/14/2020   CO2 30 04/14/2020   TSH 2.38 04/14/2020   INR 0.9 04/03/2019   HGBA1C 8.1 (H) 04/14/2020   MICROALBUR <0.7 07/31/2018    US RENAL  Result Date: 04/04/2019 CLINICAL DATA:  Acute kidney injury. COVID-19 infection. Altered mental status. EXAM: RENAL / URINARY TRACT ULTRASOUND COMPLETE COMPARISON:  Renal ultrasound 06/19/2018 FINDINGS: Right Kidney: Renal measurements: 10.1 x 6.0 x 5.2 cm = volume: 164.5 mL. There is cortical thinning with increased echogenicity. A small cyst is present in the interpolar region, measuring 15 mm maximally. No hydronephrosis. Left Kidney: Renal measurements: 11.2 x 6.7 x 4.9 cm = volume: 194.7 mL. Mild cortical thinning. No hydronephrosis. Bladder: Appears normal for degree of bladder distention. Other: Examination is mildly limited by the patient's mental status and bowel gas. IMPRESSION: 1. Both kidneys are  mildly enlarged compared with the prior ultrasound, suggesting acute tubular necrosis or interstitial nephritis. 2. No hydronephrosis or perinephric fluid collection. Electronically Signed   By: Richardean Sale M.D.   On: 04/04/2019 12:10   DG CHEST PORT 1 VIEW  Result Date: 04/04/2019 CLINICAL DATA:  Shortness of breath. EXAM: PORTABLE CHEST 1 VIEW COMPARISON:  Twenty-fifth 2021. FINDINGS: Stable cardiomediastinal silhouette. Atherosclerosis of thoracic aorta is noted. No pneumothorax or pleural effusion is noted. No pneumothorax or pleural effusion is noted. IMPRESSION: No active disease. Aortic Atherosclerosis (ICD10-I70.0). Electronically Signed   By: Marijo Conception M.D.   On: 04/04/2019 08:48   DG  Chest Port 1 View  Result Date: 04/03/2019 CLINICAL DATA:  Body aches, and sinus congestion and cough for 3 days. EXAM: PORTABLE CHEST 1 VIEW COMPARISON:  PA and lateral chest 02/25/2015. FINDINGS: The lung volumes are low. Lungs clear. No pneumothorax or pleural effusion. Heart size is normal. Atherosclerosis. No acute or focal bony abnormality. IMPRESSION: No acute disease. Atherosclerosis. Electronically Signed   By: Inge Rise M.D.   On: 04/03/2019 11:11    Assessment & Plan:   Candace was seen today for anemia, hypertension, diabetes and hyperlipidemia.  Diagnoses and all orders for this visit:  Essential hypertension, benign- Her BP is well controlled. -     Basic metabolic panel; Future -     TSH; Future -     Urinalysis, Routine w reflex microscopic; Future -     EKG 12-Lead -     Urinalysis, Routine w reflex microscopic -     TSH -     Basic metabolic panel -     metoprolol tartrate (LOPRESSOR) 25 MG tablet; Take 0.5 tablets (12.5 mg total) by mouth 2 (two) times daily.  Type 2 diabetes mellitus with diabetic nephropathy, without long-term current use of insulin ([place])- Her A1C is up to 8.1%. Will add an SGLT-2 inh to the basal insulin. -     Basic metabolic panel; Future -      Hemoglobin A1c; Future -     HM Diabetes Foot Exam -     Ambulatory referral to Ophthalmology -     Hemoglobin A1c -     Basic metabolic panel -     atorvastatin (LIPITOR) 20 MG tablet; Take 1 tablet (20 mg total) by mouth daily. -     dapagliflozin propanediol (FARXIGA) 10 MG TABS tablet; Take 1 tablet (10 mg total) by mouth daily before breakfast.  B12 deficiency- Her H/H, B12,  & folate levels are normal -     CBC with Differential/Platelet; Future -     Vitamin B12; Future -     Folate; Future -     Folate -     Vitamin B12 -     CBC with Differential/Platelet  Hyperlipidemia with target LDL less than 70- She has achieved her LDL goal and is doing well on the statin. -     Lipid panel; Future -     TSH; Future -     Hepatic function panel; Future -     Hepatic function panel -     TSH -     Lipid panel -     atorvastatin (LIPITOR) 20 MG tablet; Take 1 tablet (20 mg total) by mouth daily.  Protein-calorie malnutrition, mild (HCC)- Improvement noted  PAD (peripheral artery disease) (HCC) -     clopidogrel (PLAVIX) 75 MG tablet; Take 1 tablet (75 mg total) by mouth daily.  Stage 3b chronic kidney disease ([place])- Will add dapagliflozin for renal protection. -     dapagliflozin propanediol (FARXIGA) 10 MG TABS tablet; Take 1 tablet (10 mg total) by mouth daily before breakfast.   I am having Michelle Lopez start on dapagliflozin propanediol. I am also having her maintain her aspirin, onetouch ultrasoft, Contour Next EZ, glucose blood, Restasis, Vitamin D (Ergocalciferol), insulin aspart, Clickfine Pen Needles, Toujeo Max SoloStar, tamsulosin, clopidogrel, metoprolol tartrate, and atorvastatin.  Meds ordered this encounter  Medications  . clopidogrel (PLAVIX) 75 MG tablet    Sig: Take 1 tablet (75 mg total) by mouth daily.    Dispense:  90 tablet    Refill:  1  . metoprolol tartrate (LOPRESSOR) 25 MG tablet    Sig: Take 0.5 tablets (12.5 mg total) by mouth 2 (two) times daily.     Dispense:  90 tablet    Refill:  1  . atorvastatin (LIPITOR) 20 MG tablet    Sig: Take 1 tablet (20 mg total) by mouth daily.    Dispense:  90 tablet    Refill:  1  . dapagliflozin propanediol (FARXIGA) 10 MG TABS tablet    Sig: Take 1 tablet (10 mg total) by mouth daily before breakfast.    Dispense:  90 tablet    Refill:  1     Follow-up: Return in about 3 months (around 07/15/2020).  Scarlette Calico, MD

## 2020-04-14 NOTE — Patient Instructions (Signed)
Type 2 Diabetes Mellitus, Diagnosis, Adult Type 2 diabetes (type 2 diabetes mellitus) is a long-term, or chronic, disease. In type 2 diabetes, one or both of these problems may be present:  The pancreas does not make enough of a hormone called insulin.  Cells in the body do not respond properly to insulin that the body makes (insulin resistance). Normally, insulin allows blood sugar (glucose) to enter cells in the body. The cells use glucose for energy. Insulin resistance or lack of insulin causes excess glucose to build up in the blood instead of going into cells. This causes high blood glucose (hyperglycemia).  What are the causes? The exact cause of type 2 diabetes is not known. What increases the risk? The following factors may make you more likely to develop this condition:  Having a family member with type 2 diabetes.  Being overweight or obese.  Being inactive (sedentary).  Having been diagnosed with insulin resistance.  Having a history of prediabetes, diabetes when you were pregnant (gestational diabetes), or polycystic ovary syndrome (PCOS). What are the signs or symptoms? In the early stage of this condition, you may not have symptoms. Symptoms develop slowly and may include:  Increased thirst or hunger.  Increased urination.  Unexplained weight loss.  Tiredness (fatigue) or weakness.  Vision changes, such as blurry vision.  Dark patches on the skin. How is this diagnosed? This condition is diagnosed based on your symptoms, your medical history, a physical exam, and your blood glucose level. Your blood glucose may be checked with one or more of the following blood tests:  A fasting blood glucose (FBG) test. You will not be allowed to eat (you will fast) for 8 hours or longer before a blood sample is taken.  A random blood glucose test. This test checks blood glucose at any time of day regardless of when you ate.  An A1C (hemoglobin A1C) blood test. This test  provides information about blood glucose levels over the previous 2-3 months.  An oral glucose tolerance test (OGTT). This test measures your blood glucose at two times: ? After fasting. This is your baseline blood glucose level. ? Two hours after drinking a beverage that contains glucose. You may be diagnosed with type 2 diabetes if:  Your fasting blood glucose level is 126 mg/dL (7.0 mmol/L) or higher.  Your random blood glucose level is 200 mg/dL (11.1 mmol/L) or higher.  Your A1C level is 6.5% or higher.  Your oral glucose tolerance test result is higher than 200 mg/dL (11.1 mmol/L). These blood tests may be repeated to confirm your diagnosis.   How is this treated? Your treatment may be managed by a specialist called an endocrinologist. Type 2 diabetes may be treated by following instructions from your health care provider about:  Making dietary and lifestyle changes. These may include: ? Following a personalized nutrition plan that is developed by a registered dietitian. ? Exercising regularly. ? Finding ways to manage stress.  Checking your blood glucose level as often as told.  Taking diabetes medicines or insulin daily. This helps to keep your blood glucose levels in the healthy range.  Taking medicines to help prevent complications from diabetes. Medicines may include: ? Aspirin. ? Medicine to lower cholesterol. ? Medicine to control blood pressure. Your health care provider will set treatment goals for you. Your goals will be based on your age, other medical conditions you have, and how you respond to diabetes treatment. Generally, the goal of treatment is to maintain the   following blood glucose levels:  Before meals: 80-130 mg/dL (4.4-7.2 mmol/L).  After meals: below 180 mg/dL (10 mmol/L).  A1C level: less than 7%. Follow these instructions at home: Questions to ask your health care provider Consider asking the following questions:  Should I meet with a certified  diabetes care and education specialist?  What diabetes medicines do I need, and when should I take them?  What equipment will I need to manage my diabetes at home?  How often do I need to check my blood glucose?  Where can I find a support group for people with diabetes?  What number can I call if I have questions?  When is my next appointment? General instructions  Take over-the-counter and prescription medicines only as told by your health care provider.  Keep all follow-up visits as told by your health care provider. This is important. Where to find more information  American Diabetes Association (ADA): www.diabetes.org  American Association of Diabetes Care and Education Specialists (ADCES): www.diabeteseducator.org  International Diabetes Federation (IDF): www.idf.org Contact a health care provider if:  Your blood glucose is at or above 240 mg/dL (13.3 mmol/L) for 2 days in a row.  You have been sick or have had a fever for 2 days or longer, and you are not getting better.  You have any of the following problems for more than 6 hours: ? You cannot eat or drink. ? You have nausea and vomiting. ? You have diarrhea. Get help right away if:  You have severe hypoglycemia. This means your blood glucose is lower than 54 mg/dL (3.0 mmol/L).  You become confused or you have trouble thinking clearly.  You have difficulty breathing.  You have moderate or large ketone levels in your urine. These symptoms may represent a serious problem that is an emergency. Do not wait to see if the symptoms will go away. Get medical help right away. Call your local emergency services (911 in the [place] Do not drive yourself to the hospital. Summary  Type 2 diabetes (type 2 diabetes mellitus) is a long-term, or chronic, disease. In type 2 diabetes, the pancreas does not make enough of a hormone called insulin, or cells in the body do not respond properly to insulin that the body makes (insulin  resistance).  This condition is treated by making dietary and lifestyle changes and taking diabetes medicines or insulin.  Your health care provider will set treatment goals for you. Your goals will be based on your age, other medical conditions you have, and how you respond to diabetes treatment.  Keep all follow-up visits as told by your health care provider. This is important. This information is not intended to replace advice given to you by your health care provider. Make sure you discuss any questions you have with your health care provider. Document Revised: 08/20/2019 Document Reviewed: 08/20/2019 Elsevier Patient Education  2021 Elsevier Inc.  

## 2020-04-19 ENCOUNTER — Other Ambulatory Visit: Payer: Self-pay | Admitting: Internal Medicine

## 2020-04-19 DIAGNOSIS — I10 Essential (primary) hypertension: Secondary | ICD-10-CM

## 2020-04-19 DIAGNOSIS — E785 Hyperlipidemia, unspecified: Secondary | ICD-10-CM

## 2020-04-19 DIAGNOSIS — E1121 Type 2 diabetes mellitus with diabetic nephropathy: Secondary | ICD-10-CM

## 2020-04-19 DIAGNOSIS — I739 Peripheral vascular disease, unspecified: Secondary | ICD-10-CM

## 2020-04-28 ENCOUNTER — Other Ambulatory Visit: Payer: Self-pay | Admitting: Internal Medicine

## 2020-04-28 DIAGNOSIS — M8580 Other specified disorders of bone density and structure, unspecified site: Secondary | ICD-10-CM

## 2020-04-28 DIAGNOSIS — E559 Vitamin D deficiency, unspecified: Secondary | ICD-10-CM

## 2020-04-28 DIAGNOSIS — I10 Essential (primary) hypertension: Secondary | ICD-10-CM

## 2020-04-29 ENCOUNTER — Telehealth: Payer: Self-pay

## 2020-04-29 NOTE — Telephone Encounter (Signed)
1:11PM: palliative care SW outreached patients daughter, [name], to check in on patient and schedule in person visit. Daughter stated that patient is doing very well and requested that SW outreach her again tomorrow to schedule in person visit.   SW call daughter 3/25 to schedule home visit.

## 2020-04-30 ENCOUNTER — Telehealth: Payer: Self-pay

## 2020-04-30 NOTE — Telephone Encounter (Signed)
3/25 '@1145AM'$ : Palliative care SW outreached patients daughter, [name], as discussed yesterday.  Call unsuccessful. SW unable to LVM due to mailbox being full. PC team will outreach daughter again at later date/time to schedule.

## 2020-06-01 ENCOUNTER — Telehealth: Payer: Self-pay

## 2020-06-01 NOTE — Telephone Encounter (Signed)
415 pm.  Phone call made to Michelle Lopez to complete a telephonic visit or schedule and in-person visit.  Michelle Lopez is unable to speak at this time as she is at work.  She is requesting a call back tomorrow morning.  PLAN:  Call daughter in the am.

## 2020-06-02 ENCOUNTER — Encounter: Payer: Self-pay | Admitting: Internal Medicine

## 2020-06-02 ENCOUNTER — Other Ambulatory Visit: Payer: Self-pay

## 2020-06-02 ENCOUNTER — Ambulatory Visit (INDEPENDENT_AMBULATORY_CARE_PROVIDER_SITE_OTHER): Payer: Medicare Other | Admitting: Internal Medicine

## 2020-06-02 VITALS — BP 150/96 | HR 65 | Temp 98.2°F | Ht 60.0 in | Wt 150.0 lb

## 2020-06-02 DIAGNOSIS — N1832 Chronic kidney disease, stage 3b: Secondary | ICD-10-CM | POA: Diagnosis not present

## 2020-06-02 DIAGNOSIS — R600 Localized edema: Secondary | ICD-10-CM | POA: Diagnosis not present

## 2020-06-02 DIAGNOSIS — I10 Essential (primary) hypertension: Secondary | ICD-10-CM | POA: Diagnosis not present

## 2020-06-02 DIAGNOSIS — T783XXA Angioneurotic edema, initial encounter: Secondary | ICD-10-CM | POA: Insufficient documentation

## 2020-06-02 MED ORDER — PREDNISONE 10 MG PO TABS: ORAL_TABLET | ORAL | 0 refills | Status: DC

## 2020-06-02 MED ORDER — HYDROCHLOROTHIAZIDE 12.5 MG PO CAPS: 12.5000 mg | ORAL_CAPSULE | Freq: Every day | ORAL | 3 refills | Status: DC

## 2020-06-02 NOTE — Patient Instructions (Signed)
Please take all new medication as prescribed - the prednisone for the lips allergy reaction  Please take all new medication as prescribed - the mild fluid pill for the swelling and blood pressure  Please continue to monitor your BP at home, with the goal being to be at least less than 140/90  Please continue all other medications as before, and refills have been done if requested.  Please have the pharmacy call with any other refills you may need.  Please keep your appointments with your specialists as you may have planned  Please see Dr Ronnald Ramp in 3 months

## 2020-06-02 NOTE — Progress Notes (Signed)
Patient ID: Michelle Lopez, female   DOB: [birth year], 75 y.o.   MRN: [id number]        Chief Complaint: lip swelling, htn, pedal edema, ckd       HPI:  Michelle Lopez is a 75 y.o. female here with c/o 2 days both lip swelling with itching but no trauma, pain or other swelling such as tongue.  Pt denies chest pain, increased sob or doe, wheezing, orthopnea, PND, increased LE swelling, palpitations, dizziness or syncope, except for bilateral pedal edema for several wks.   Pt denies polydipsia, polyuria, or new focal neuro s/s.   Pt denies fever, wt loss, night sweats, loss of appetite, or other constitutional symptoms      Wt Readings from Last 3 Encounters:  06/02/20 150 lb (68 kg)  04/14/20 151 lb (68.5 kg)  03/19/20 150 lb 6.4 oz (68.2 kg)   BP Readings from Last 3 Encounters:  06/02/20 (!) 150/96  04/14/20 136/86  03/19/20 (!) 171/80         Past Medical History:  Diagnosis Date  . Cerebrovascular disease, unspecified   . Cocaine abuse, unspecified   . Diabetes mellitus    type 2, uncontrolled  . Diarrhea   . Essential hypertension, benign   . Hyperlipidemia   . Stroke [facility])    x7   Past Surgical History:  Procedure Laterality Date  . TOTAL HIP ARTHROPLASTY  1970   Dr. Rodell Perna    reports that she has never smoked. She has never used smokeless tobacco. She reports that she does not drink alcohol and does not use drugs. family history is not on file. Allergies  Allergen Reactions  . Amlodipine Other (See Comments)    Ankle edema  . Crestor [Rosuvastatin Calcium]     myalgias  . Morphine Hives  . Pravastatin Sodium Rash  . Simvastatin Rash   Current Outpatient Medications on File Prior to Visit  Medication Sig Dispense Refill  . aspirin 81 MG tablet Take 81 mg by mouth daily.    Marland Kitchen atorvastatin (LIPITOR) 20 MG tablet TAKE 1 TABLET BY MOUTH EVERY DAY 90 tablet 1  . Blood Glucose Monitoring Suppl (CONTOUR NEXT EZ) w/Device KIT 1 Act by Does not apply route 2 (two) times  daily. 1 kit 0  . CLICKFINE PEN NEEDLES 31G X 6 MM MISC USE TO INJECT insulin UP TO 5 TIMES DAILY 300 each 1  . clopidogrel (PLAVIX) 75 MG tablet TAKE 1 TABLET BY MOUTH EVERY DAY 90 tablet 1  . dapagliflozin propanediol (FARXIGA) 10 MG TABS tablet Take 1 tablet (10 mg total) by mouth daily before breakfast. 90 tablet 1  . glucose blood (ONETOUCH VERIO) test strip Use to check blood sugars twice a day 300 each 1  . insulin aspart (NOVOLOG) 100 UNIT/ML FlexPen Inject 0-9 Units into the skin 3 (three) times daily with meals. 15 mL 0  . insulin glargine, 2 Unit Dial, (TOUJEO MAX SOLOSTAR) 300 UNIT/ML Solostar Pen Inject 20 Units into the skin daily. 6 mL 0  . Lancets (ONETOUCH ULTRASOFT) lancets Use to check blood sugars twice a day 300 each 1  . metoprolol tartrate (LOPRESSOR) 25 MG tablet TAKE 1/2 TABLET BY MOUTH 2 TIMES DAILY 90 tablet 1  . RESTASIS 0.05 % ophthalmic emulsion Place 1 drop into both eyes daily.    . tamsulosin (FLOMAX) 0.4 MG CAPS capsule TAKE 1 CAPSULE BY MOUTH EVERY DAY 90 capsule 1  . Vitamin D, Ergocalciferol, (DRISDOL) 1.25 MG (50000 UNIT)  CAPS capsule TAKE 1 CAPSULE BY MOUTH once weekly 54 capsule 0   No current facility-administered medications on file prior to visit.        ROS:  All others reviewed and negative.  Objective        PE:  BP (!) 150/96 (BP Location: Left Arm, Patient Position: Sitting, Cuff Size: Normal)   Pulse 65   Temp 98.2 F (36.8 C) (Oral)   Ht 5' (1.524 m)   Wt 150 lb (68 kg)   SpO2 95%   BMI 29.29 kg/m                 Constitutional: Pt appears in NAD               HENT: Head: NCAT.                Right Ear: External ear normal.                 Left Ear: External ear normal.                Eyes: . Pupils are equal, round, and reactive to light. Conjunctivae and EOM are normal; both lips with diffuse nontender 1+ swelling               Nose: without d/c or deformity               Neck: Neck supple. Gross normal ROM                Cardiovascular: Normal rate and regular rhythm.                 Pulmonary/Chest: Effort normal and breath sounds without rales or wheezing.                Abd:  Soft, NT, ND, + BS, no organomegaly               Neurological: Pt is alert. At baseline orientation, motor grossly intact               Skin: Skin is warm. No rashes, no other new lesions, LE edema - trace pedal bilat               Psychiatric: Pt behavior is normal without agitation   Micro: none  Cardiac tracings I have personally interpreted today:  none  Pertinent Radiological findings (summarize): none   Lab Results  Component Value Date   WBC 7.2 04/14/2020   HGB 14.4 04/14/2020   HCT 44.8 04/14/2020   PLT 225.0 04/14/2020   GLUCOSE 136 (H) 04/14/2020   CHOL 162 04/14/2020   TRIG 162.0 (H) 04/14/2020   HDL 41.20 04/14/2020   LDLDIRECT 100.0 07/06/2015   LDLCALC 89 04/14/2020   ALT 12 04/14/2020   AST 16 04/14/2020   NA 140 04/14/2020   K 3.8 04/14/2020   CL 103 04/14/2020   CREATININE 1.07 04/14/2020   BUN 18 04/14/2020   CO2 30 04/14/2020   TSH 2.38 04/14/2020   INR 0.9 04/03/2019   HGBA1C 8.1 (H) 04/14/2020   MICROALBUR <0.7 07/31/2018   Assessment/Plan:  Michelle Lopez is a 75 y.o. Black or African American [2] female with  has a past medical history of Cerebrovascular disease, unspecified, Cocaine abuse, unspecified, Diabetes mellitus, Diarrhea, Essential hypertension, benign, Hyperlipidemia, and Stroke ([place]).  Stage 3b chronic kidney disease ([place]) Lab Results  Component Value Date   CREATININE 1.07 04/14/2020   Stable overall, cont  to avoid nephrotoxins  Pedal edema Very mild, for leg elevation, low salt, wt control  Hypertension, uncontrolled BP Readings from Last 3 Encounters:  06/02/20 (!) 150/96  04/14/20 136/86  03/19/20 (!) 171/80   Uncontrolled, to add hct 12.5 qd, cont lopressor   Angioedema of lips Etiology uclear, not ACE related, for predpac asd,  to f/u any worsening symptoms  or concerns  Followup: Return if symptoms worsen or fail to improve.  Cathlean Cower, MD 06/06/2020 8:23 PM [facility]

## 2020-06-03 ENCOUNTER — Telehealth: Payer: Self-pay

## 2020-06-03 NOTE — Telephone Encounter (Signed)
110 pm.  Phone call made to Michelle Lopez to follow up on patient status.  Michelle Lopez reports PCP visit yesterday due to swelling on patient's ankles and mouth.  It is believed that patient is having an allergic reaction and she has been prescribed medications to help with this.  Michelle Lopez is having a family member pick this up today as the medication was not available yesterday.  Patient does have a private caregiver who is monitoring/documenting blood pressures.  Patient is eating well and has gained a couple of pounds per daughter.  No falls have occurred although patient does have some dizziness.  Overall Michelle Lopez states she is very happy with how the patient is doing and she does not have any concerns at this time.  Michelle Lopez has provided the private caregiver's name and # for future calls regarding patient's condition.  Michelle Lopez [phone]

## 2020-06-06 ENCOUNTER — Encounter: Payer: Self-pay | Admitting: Internal Medicine

## 2020-06-06 NOTE — Assessment & Plan Note (Signed)
Lab Results  Component Value Date   CREATININE 1.07 04/14/2020   Stable overall, cont to avoid nephrotoxins

## 2020-06-06 NOTE — Assessment & Plan Note (Signed)
Etiology uclear, not ACE related, for predpac asd,  to f/u any worsening symptoms or concerns

## 2020-06-06 NOTE — Assessment & Plan Note (Signed)
BP Readings from Last 3 Encounters:  06/02/20 (!) 150/96  04/14/20 136/86  03/19/20 (!) 171/80   Uncontrolled, to add hct 12.5 qd, cont lopressor

## 2020-06-06 NOTE — Assessment & Plan Note (Signed)
Very mild, for leg elevation, low salt, wt control

## 2020-06-29 ENCOUNTER — Other Ambulatory Visit: Payer: Self-pay

## 2020-06-29 ENCOUNTER — Telehealth: Payer: Medicare Other

## 2020-06-29 DIAGNOSIS — Z515 Encounter for palliative care: Secondary | ICD-10-CM | POA: Diagnosis not present

## 2020-06-29 NOTE — Progress Notes (Signed)
1:20PM: Palliative care SW outreached patient to complete telephonic visit.   Palliative care SW outreached patient to complete telephonic visit. Patient's caregiver, Emeline Darling, provided update on medical condition and/or changes. Caregiver shares that patient has been doing well. Patient denies any pains. No recent falls, she uses her rollator for mobility assistance and has a caregiver for standby supervision if needed.   Patients appetite is fair. No weighed changes noted.   Caregiver checks patients blood sugars and blood pressures. Caregiver shares that patients' blood sugar and BP's can be abnormal at times depending on what patients eats, but they are to stabilize them fairly quickly.  Patient denies financial, food insecurities or issues with transportation. No other psychosocial needs. Patient states that she feels she has been doing well. Caregiver is in the home M-F 8am-3pm, patients daughters provide additional support.   Palliative care will continue to monitor and assist with long term care planning as needed.

## 2020-07-30 ENCOUNTER — Other Ambulatory Visit: Payer: Medicare Other

## 2020-07-30 ENCOUNTER — Other Ambulatory Visit: Payer: Self-pay

## 2020-07-30 DIAGNOSIS — Z515 Encounter for palliative care: Secondary | ICD-10-CM

## 2020-07-30 NOTE — Progress Notes (Signed)
PATIENT NAME: Michelle Lopez DOB: 02-24-1945 MRN: [id number]  PRIMARY CARE PROVIDER: Janith Lima, MD  RESPONSIBLE PARTY:  Acct ID - Guarantor Home Phone Work Phone Relationship Acct Type  [id number] - [place],[phone]  Self P/F     [address], [address], [place], [place] [address]   TELEHEALTH VISIT STATEMENT Due to the COVID-19 crisis, this visit was done via telemedicine from my office and it was initiated and consent by this patient and or family.  PLAN OF CARE and INTERVENTIONS:               1.  GOALS OF CARE/ ADVANCE CARE PLANNING:  Remain home under the care of private caregivers.               2.  PATIENT/CAREGIVER EDUCATION:  Safety               4. PERSONAL EMERGENCY PLAN:  Activate 911 for emergencies.               5.  DISEASE STATUS:  Telephonic visit completed with Tosha-private caregiver.  She reports patient has been doing well since Palliative Care's last contact.    Patient is ambulatory without assistive devices.  Caregiver reports a couple of incidents with dizziness but no further issues.  No falls are reported.  Safety discussed.   Patient continues to feed herself.  Appetite remains good.  No changes in weight are observed by caregiver.  Patient continues to complete tasks with activities of daily living.  Supervision is required.     In-person visit scheduled for July 13 th @ 10 am.      HISTORY OF PRESENT ILLNESS:  75 year old female with a hx of CVA and DM.  Patient is being followed by Palliative Care monthly and PRN.  CODE STATUS: Full ADVANCED DIRECTIVES: No MOST FORM: No PPS: 50%        Lorenza Burton, RN

## 2020-08-10 ENCOUNTER — Ambulatory Visit (INDEPENDENT_AMBULATORY_CARE_PROVIDER_SITE_OTHER): Payer: Medicare Other | Admitting: Internal Medicine

## 2020-08-10 ENCOUNTER — Other Ambulatory Visit: Payer: Self-pay

## 2020-08-10 ENCOUNTER — Encounter: Payer: Self-pay | Admitting: Internal Medicine

## 2020-08-10 VITALS — BP 128/64 | HR 60 | Temp 97.8°F | Ht 60.0 in | Wt 142.0 lb

## 2020-08-10 DIAGNOSIS — L609 Nail disorder, unspecified: Secondary | ICD-10-CM | POA: Diagnosis not present

## 2020-08-10 DIAGNOSIS — K0889 Other specified disorders of teeth and supporting structures: Secondary | ICD-10-CM | POA: Diagnosis not present

## 2020-08-10 DIAGNOSIS — I10 Essential (primary) hypertension: Secondary | ICD-10-CM | POA: Diagnosis not present

## 2020-08-10 DIAGNOSIS — E1121 Type 2 diabetes mellitus with diabetic nephropathy: Secondary | ICD-10-CM | POA: Diagnosis not present

## 2020-08-10 DIAGNOSIS — E119 Type 2 diabetes mellitus without complications: Secondary | ICD-10-CM | POA: Diagnosis not present

## 2020-08-10 DIAGNOSIS — H04123 Dry eye syndrome of bilateral lacrimal glands: Secondary | ICD-10-CM | POA: Diagnosis not present

## 2020-08-10 DIAGNOSIS — H402231 Chronic angle-closure glaucoma, bilateral, mild stage: Secondary | ICD-10-CM | POA: Diagnosis not present

## 2020-08-10 LAB — HM DIABETES EYE EXAM

## 2020-08-10 NOTE — Assessment & Plan Note (Signed)
BP Readings from Last 3 Encounters:  08/10/20 128/64  06/02/20 (!) 150/96  04/14/20 136/86   Stable, pt to continue medical treatment - hct, lopressor

## 2020-08-10 NOTE — Patient Instructions (Signed)
Please continue all other medications as before, and refills have been done if requested.  Please have the pharmacy call with any other refills you may need.  Please continue your efforts at being more active, low cholesterol diet, and weight control.  Please keep your appointments with your specialists as you may have planned  You will be contacted regarding the referral for: podiatry, and oral surgury

## 2020-08-10 NOTE — Assessment & Plan Note (Signed)
With hx of DM and risk of toe ulcers - for podiatry referral

## 2020-08-10 NOTE — Assessment & Plan Note (Signed)
Lab Results  Component Value Date   HGBA1C 8.1 (H) 04/14/2020   Stable, pt to continue current medical treatment - toujeo

## 2020-08-10 NOTE — Progress Notes (Signed)
Patient ID: Michelle Lopez, female   DOB: 01/09/1946, 75 y.o.   MRN: [id number]        Chief Complaint: follow up nail disorder and dental /jaw pain after fall       HPI:  Michelle Lopez is a 75 y.o. female here with family, who relates pt had a recent fall and struck right jawline, now with ongoing dental pain and swelling, needs an appt with oral surgury and has a provider in mind.  Also has worsening long nails to all toes and starting to dig in and cause irritation and pain to next toes, needs podiatry referral.  Pt denies chest pain, increased sob or doe, wheezing, orthopnea, PND, increased LE swelling, palpitations, dizziness or syncope.   Pt denies polydipsia, polyuria, or new focal neuro s/s.   Pt denies fever, wt loss, night sweats, loss of appetite, or other constitutional symptoms  No other new complaints    May need to have several teeth pulled.  Dementia overall stable symptomatically, and not assoc with behavioral changes such as hallucinations, paranoia.  Has appt soon to f/u with PCP Wt Readings from Last 3 Encounters:  08/10/20 142 lb (64.4 kg)  06/02/20 150 lb (68 kg)  04/14/20 151 lb (68.5 kg)   BP Readings from Last 3 Encounters:  08/10/20 128/64  06/02/20 (!) 150/96  04/14/20 136/86         Past Medical History:  Diagnosis Date   Cerebrovascular disease, unspecified    Cocaine abuse, unspecified    Diabetes mellitus    type 2, uncontrolled   Diarrhea    Essential hypertension, benign    Hyperlipidemia    Stroke (HCC)    x7   Past Surgical History:  Procedure Laterality Date   TOTAL HIP ARTHROPLASTY  1970   Dr. Mark Yates    reports that she has never smoked. She has never used smokeless tobacco. She reports that she does not drink alcohol and does not use drugs. family history is not on file. Allergies  Allergen Reactions   Amlodipine Other (See Comments)    Ankle edema   Crestor [Rosuvastatin Calcium]     myalgias   Morphine Hives   Pravastatin Sodium Rash    Simvastatin Rash   Current Outpatient Medications on File Prior to Visit  Medication Sig Dispense Refill   aspirin 81 MG tablet Take 81 mg by mouth daily.     atorvastatin (LIPITOR) 20 MG tablet TAKE 1 TABLET BY MOUTH EVERY DAY 90 tablet 1   Blood Glucose Monitoring Suppl (CONTOUR NEXT EZ) w/Device KIT 1 Act by Does not apply route 2 (two) times daily. 1 kit 0   CLICKFINE PEN NEEDLES 31G X 6 MM MISC USE TO INJECT insulin UP TO 5 TIMES DAILY 300 each 1   clopidogrel (PLAVIX) 75 MG tablet TAKE 1 TABLET BY MOUTH EVERY DAY 90 tablet 1   glucose blood (ONETOUCH VERIO) test strip Use to check blood sugars twice a day 300 each 1   hydrochlorothiazide (MICROZIDE) 12.5 MG capsule Take 1 capsule (12.5 mg total) by mouth daily. 90 capsule 3   insulin glargine, 2 Unit Dial, (TOUJEO MAX SOLOSTAR) 300 UNIT/ML Solostar Pen Inject 20 Units into the skin daily. 6 mL 0   Lancets (ONETOUCH ULTRASOFT) lancets Use to check blood sugars twice a day 300 each 1   metoprolol tartrate (LOPRESSOR) 25 MG tablet TAKE 1/2 TABLET BY MOUTH 2 TIMES DAILY 90 tablet 1   predniSONE (DELTASONE) 10 MG tablet   3 tabs by mouth per day for 3 days,2tabs per day for 3 days,1tab per day for 3 days 18 tablet 0   RESTASIS 0.05 % ophthalmic emulsion Place 1 drop into both eyes daily.     tamsulosin (FLOMAX) 0.4 MG CAPS capsule TAKE 1 CAPSULE BY MOUTH EVERY DAY 90 capsule 1   dapagliflozin propanediol (FARXIGA) 10 MG TABS tablet Take 1 tablet (10 mg total) by mouth daily before breakfast. 90 tablet 1   insulin aspart (NOVOLOG) 100 UNIT/ML FlexPen Inject 0-9 Units into the skin 3 (three) times daily with meals. 15 mL 0   Vitamin D, Ergocalciferol, (DRISDOL) 1.25 MG (50000 UNIT) CAPS capsule TAKE 1 CAPSULE BY MOUTH once weekly (Patient not taking: Reported on 08/10/2020) 54 capsule 0   No current facility-administered medications on file prior to visit.        ROS:  All others reviewed and negative.  Objective        PE:  BP 128/64 (BP  Location: Right Arm, Patient Position: Sitting, Cuff Size: Normal)   Pulse 60   Temp 97.8 F (36.6 C) (Oral)   Ht 5' (1.524 m)   Wt 142 lb (64.4 kg)   SpO2 99%   BMI 27.73 kg/m                 Constitutional: Pt appears in NAD               HENT: Head: NCAT.                Right Ear: External ear normal.                 Left Ear: External ear normal.                Eyes: . Pupils are equal, round, and reactive to light. Conjunctivae and EOM are normal; also multiple teeth with swelling tender without d/c               Nose: without d/c or deformity               Neck: Neck supple. Gross normal ROM               Cardiovascular: Normal rate and regular rhythm.                 Pulmonary/Chest: Effort normal and breath sounds without rales or wheezing.                               Neurological: Pt is alert. At baseline orientation, motor grossly intact               Skin: Skin is warm. No rashes, no other new lesions, LE edema - none but does have all toes with long nails               Psychiatric: Pt behavior is normal without agitation   Micro: none  Cardiac tracings I have personally interpreted today:  none  Pertinent Radiological findings (summarize): none   Lab Results  Component Value Date   WBC 7.2 04/14/2020   HGB 14.4 04/14/2020   HCT 44.8 04/14/2020   PLT 225.0 04/14/2020   GLUCOSE 136 (H) 04/14/2020   CHOL 162 04/14/2020   TRIG 162.0 (H) 04/14/2020   HDL 41.20 04/14/2020   LDLDIRECT 100.0 07/06/2015   LDLCALC 89 04/14/2020   ALT 12 04/14/2020   AST  16 04/14/2020   NA 140 04/14/2020   K 3.8 04/14/2020   CL 103 04/14/2020   CREATININE 1.07 04/14/2020   BUN 18 04/14/2020   CO2 30 04/14/2020   TSH 2.38 04/14/2020   INR 0.9 04/03/2019   HGBA1C 8.1 (H) 04/14/2020   MICROALBUR <0.7 07/31/2018   Assessment/Plan:  Michelle Lopez is a 75 y.o. Black or African American [2] female with  has a past medical history of Cerebrovascular disease, unspecified, Cocaine  abuse, unspecified, Diabetes mellitus, Diarrhea, Essential hypertension, benign, Hyperlipidemia, and Stroke (HCC).  No problem-specific Assessment & Plan notes found for this encounter.  Followup: No follow-ups on file.   , MD 08/10/2020 5:02 PM [facility]  

## 2020-08-10 NOTE — Assessment & Plan Note (Signed)
With mild to mod abnormal exam, for referral oral surgury as requested

## 2020-08-18 ENCOUNTER — Other Ambulatory Visit: Payer: Self-pay

## 2020-08-18 ENCOUNTER — Encounter: Payer: Self-pay | Admitting: Internal Medicine

## 2020-08-18 ENCOUNTER — Other Ambulatory Visit: Payer: Medicare Other

## 2020-08-20 ENCOUNTER — Encounter: Payer: Self-pay | Admitting: Podiatry

## 2020-08-20 ENCOUNTER — Ambulatory Visit (INDEPENDENT_AMBULATORY_CARE_PROVIDER_SITE_OTHER): Payer: Medicare Other | Admitting: Podiatry

## 2020-08-20 ENCOUNTER — Other Ambulatory Visit: Payer: Self-pay

## 2020-08-20 DIAGNOSIS — M79675 Pain in left toe(s): Secondary | ICD-10-CM | POA: Diagnosis not present

## 2020-08-20 DIAGNOSIS — M79674 Pain in right toe(s): Secondary | ICD-10-CM | POA: Diagnosis not present

## 2020-08-20 DIAGNOSIS — B351 Tinea unguium: Secondary | ICD-10-CM | POA: Diagnosis not present

## 2020-08-22 NOTE — Progress Notes (Signed)
  Subjective:  Patient ID: Michelle Lopez, female    DOB: 08/01/1945,  MRN: [id number]  Clydene Kondo presents to clinic today for for at risk foot care. Patient has h/o PAD and painful thick toenails that are difficult to trim. Pain interferes with ambulation. Aggravating factors include wearing enclosed shoe gear. Pain is relieved with periodic professional debridement.  Patient states blood glucose was 153 mg/dl today.  PCP is Janith Lima, MD , and last visit was 04/14/2020.  Allergies  Allergen Reactions   Amlodipine Other (See Comments)    Ankle edema   Crestor [Rosuvastatin Calcium]     myalgias   Morphine Hives   Pravastatin Sodium Rash   Simvastatin Rash    Review of Systems: Negative except as noted in the HPI. Objective:   Constitutional Wafaa Virani is a pleasant 75 y.o. African American female, in NAD. AAO x 3.   Vascular Capillary refill time to digits immediate b/l. Palpable pedal pulses b/l LE. Pedal hair sparse. Lower extremity skin temperature gradient within normal limits. No pain with calf compression b/l. No edema noted b/l lower extremities. No cyanosis or clubbing noted.  Neurologic Normal speech. Oriented to person, place, and time. Protective sensation intact 5/5 intact bilaterally with 10g monofilament b/l. Vibratory sensation intact b/l.  Dermatologic Pedal skin with normal turgor, texture and tone b/l lower extremities. No open wounds b/l lower extremities. No interdigital macerations b/l lower extremities. Toenails 1-5 b/l elongated, discolored, dystrophic, thickened, crumbly with subungual debris and tenderness to dorsal palpation.  Orthopedic: Normal muscle strength 5/5 to all lower extremity muscle groups bilaterally. No pain crepitus or joint limitation noted with ROM b/l. Hallux valgus with bunion deformity noted b/l lower extremities. Hammertoe(s) noted to the L 2nd toe, L 3rd toe, R 2nd toe, and R 3rd toe.   Radiographs: None Assessment:   1. Pain  due to onychomycosis of toenails of both feet    Plan:  -Examined patient. -Continue diabetic foot care principles. -Patient to continue soft, supportive shoe gear daily. -Toenails 1-5 b/l were debrided in length and girth with sterile nail nippers and dremel without iatrogenic bleeding.  -Patient to report any pedal injuries to medical professional immediately. -Patient/POA to call should there be question/concern in the interim.  Return in about 3 months (around 11/20/2020).  Marzetta Board, DPM

## 2020-08-26 ENCOUNTER — Other Ambulatory Visit: Payer: Medicare Other

## 2020-08-26 ENCOUNTER — Other Ambulatory Visit: Payer: Self-pay

## 2020-08-26 VITALS — BP 98/54 | HR 80 | Temp 97.2°F | Resp 20

## 2020-08-26 DIAGNOSIS — Z515 Encounter for palliative care: Secondary | ICD-10-CM

## 2020-08-26 NOTE — Progress Notes (Signed)
COMMUNITY PALLIATIVE CARE SW NOTE  PATIENT NAME: Michelle Lopez DOB: 1946/01/03 MRN: [id number]  PRIMARY CARE PROVIDER: Janith Lima, MD  RESPONSIBLE PARTY:  Acct ID - Guarantor Home Phone Work Phone Relationship Acct Type  [id number] - [place],[phone]  Self P/F     [address], [address], [place], [place] [address]     PLAN OF CARE and INTERVENTIONS:             GOALS OF CARE/ ADVANCE CARE PLANNING:  Patient is FULL CODE.Marland Kitchen Goal is to remain at home and continue to grow stronger.    2.         SOCIAL/EMOTIONAL/SPIRITUAL ASSESSMENT/ INTERVENTIONS:  SW and RN met with patient. Patient provided update on patient's medical status. Patient suffers from HTN and dementia. Patient lives in a two story town home with daughter.    Patient provided overview and update on patients care and possible needs. Patient sitting at dining room table during visit. Patient recently saw podiatry to toenail trim, due to patient being diabetic. Patient scheduled to see PCP next week. No recent hospitalizations. Patient shared that only pain she is her toothaches which she soothes Tylenol, patient shares oral gel do not work. Patient has been to the dentist and it was recommended that patient has teeth pulled, patient does not want this at this time, so she is choosing to deal with the toothaches as they come.   Patient appetite has not changed. Patient has poor appetite and may only eat once a day, no weight changes noticed. No falls reported. Patient does not sleep well, as she tends to stay up late watching television. SW discussed alternative sleep aids such as melatonin, patient did not seem interested at this time.   RN reviewed medications and checked vitals. Patients BS have been running well and is checked Q AM. Patient shared that she has some irritation under her R breast and is treating with OTC ointment. RN suggested nystatin or antifungal powder instead on cream/ointment to keep the area dry.     Patient continues to have caregiver, Tosha, through [employer] M-F:8an-4pm and Sat-Sun: 10am-3pm. Patients daughter and caregiver provide transport to appointments. Caregiver provides meal prep and assist with medications. No psychosocial needs at this time.  SW provided education on palliative care, discussed goals, reviewed care plan, provided emotional support, used active and reflective listening. Palliative care will continue to monitor and assist with long term care planning as needed.  PATIENT/CAREGIVER EDUCATION/ COPING:  Patient was alert and oriented and engaged in visit/conversation. Patient has some mild memory deficits. Family is supportive. Patient enjoys sitting outside and watching westerns.  PERSONAL EMERGENCY PLAN:  Family will call 9-1-1 if emergency. Patient also has a LifeAlert necklace  COMMUNITY RESOURCES COORDINATION/ HEALTH CARE NAVIGATION:  Family assist with needs. Patient has CAP aide through agency is [facility].   FINANCIAL/LEGAL CONCERNS/INTERVENTIONS:  Patient has Medicaid, and is enrolled in CAP.      SOCIAL HX:  Social History   Tobacco Use   Smoking status: Never   Smokeless tobacco: Never  Substance Use Topics   Alcohol use: No    Alcohol/week: 0.0 standard drinks    CODE STATUS: FULL CODE ADVANCED DIRECTIVES: N MOST FORM COMPLETE: N HOSPICE EDUCATION PROVIDED: N  NAT:[id number] Is able to ambulate with rollator, able to take self to rest room and dress self.. Caregiver assist with bathing. Patient able to feedself.   Time spent: 45 min  Doreene Eland, [place]

## 2020-08-26 NOTE — Progress Notes (Signed)
PATIENT NAME: Michelle Lopez DOB: 08/21/1945 MRN: [id number]  PRIMARY CARE PROVIDER: Janith Lima, MD  RESPONSIBLE PARTY:  Acct ID - Guarantor Home Phone Work Phone Relationship Acct Type  [id number] - [place],[phone]  Self P/F     [address], [address], [place], [place] [address]    PLAN OF CARE and INTERVENTIONS:               1.  GOALS OF CARE/ ADVANCE CARE PLANNING:  Remain home with the assistance of family and private caregivers.                2.  PATIENT/CAREGIVER EDUCATION:  safety               4. PERSONAL EMERGENCY PLAN:  Activate 911 for emergencies.               5.  DISEASE STATUS:  Joint visit with [place], SW, patient and private caregiver Toshia.   Patient is found sitting up in dinning room.  She has just recently awakened and has eaten her breakfast.  Patient states she staying awake until the early morning watching television.  She typically awakens around 12-1 in the afternoon and only sleeps for 2-4 hours a night.  Patient   Patient does have some discomfort to her teeth.  Complete extraction was recommended per patient but she declined.  Patient notes rash present under her breast.  Erythema noted with a small amount of yeast present.  Discussed use of nystatin powder.  Patient has been using triamcinolone ointment but noted it was not effective.  Patient will try phytoplex powder under breast and skin folds.  If not effective she will follow up with PCP next week on her scheduled visit.   Appetite has been fair.  Patient states she does not drink any supplement drinks but is willing to try them.  Encouraged patient to try Glucerna due to diabetes.  Patient is obtaining blood sugar readings regularly.  This am her blood sugar was 98.  Patient is using a rolling walker in the home and a cane outside of the home.  She notes feeling unsteady when using the cane.  I have encouraged her to use the rolling walker outside of the home for extra stability.   Patient reports no falls. She continues to have issues with vertigo.  Safety and high risk for falls discussed with patient.    HISTORY OF PRESENT ILLNESS:  75 year old female with a hx of CVA and DM. Patient is being followed by Palliative Care monthly and PRN.  CODE STATUS: Full ADVANCED DIRECTIVES: No MOST FORM: No PPS: 50%   PHYSICAL EXAM:   VITALS: Today's Vitals   08/26/20 1313  BP: (!) 98/54  Pulse: 80  Resp: 20  Temp: (!) 97.2 F (36.2 C)  SpO2: 97%    LUNGS: clear to auscultation  CARDIAC: Cor RRR}  EXTREMITIES: right pedal edema 1+ SKIN: Skin color, texture, turgor normal. No rashes or lesions or erythema - under left breast   NEURO: positive for gait problems and vertigo       Lorenza Burton, RN

## 2020-09-01 ENCOUNTER — Ambulatory Visit: Payer: Medicare Other | Admitting: Internal Medicine

## 2020-10-13 ENCOUNTER — Other Ambulatory Visit: Payer: Self-pay | Admitting: Internal Medicine

## 2020-10-13 DIAGNOSIS — E785 Hyperlipidemia, unspecified: Secondary | ICD-10-CM

## 2020-10-13 DIAGNOSIS — I739 Peripheral vascular disease, unspecified: Secondary | ICD-10-CM

## 2020-10-13 DIAGNOSIS — I10 Essential (primary) hypertension: Secondary | ICD-10-CM

## 2020-10-13 DIAGNOSIS — E1121 Type 2 diabetes mellitus with diabetic nephropathy: Secondary | ICD-10-CM

## 2020-10-20 ENCOUNTER — Other Ambulatory Visit: Payer: Self-pay | Admitting: Internal Medicine

## 2020-10-20 DIAGNOSIS — E785 Hyperlipidemia, unspecified: Secondary | ICD-10-CM

## 2020-10-20 DIAGNOSIS — I10 Essential (primary) hypertension: Secondary | ICD-10-CM

## 2020-10-20 DIAGNOSIS — E1121 Type 2 diabetes mellitus with diabetic nephropathy: Secondary | ICD-10-CM

## 2020-10-20 DIAGNOSIS — I739 Peripheral vascular disease, unspecified: Secondary | ICD-10-CM

## 2020-11-02 ENCOUNTER — Other Ambulatory Visit: Payer: Self-pay | Admitting: Internal Medicine

## 2020-11-02 DIAGNOSIS — I10 Essential (primary) hypertension: Secondary | ICD-10-CM

## 2020-11-10 ENCOUNTER — Ambulatory Visit: Payer: Medicare Other | Admitting: Internal Medicine

## 2020-11-26 ENCOUNTER — Ambulatory Visit (INDEPENDENT_AMBULATORY_CARE_PROVIDER_SITE_OTHER): Payer: Medicaid Other | Admitting: Podiatry

## 2020-11-26 ENCOUNTER — Other Ambulatory Visit: Payer: Self-pay

## 2020-11-26 DIAGNOSIS — E1121 Type 2 diabetes mellitus with diabetic nephropathy: Secondary | ICD-10-CM

## 2020-11-26 DIAGNOSIS — B351 Tinea unguium: Secondary | ICD-10-CM | POA: Diagnosis not present

## 2020-11-26 DIAGNOSIS — M79674 Pain in right toe(s): Secondary | ICD-10-CM

## 2020-11-26 DIAGNOSIS — M79675 Pain in left toe(s): Secondary | ICD-10-CM

## 2020-12-03 ENCOUNTER — Encounter: Payer: Self-pay | Admitting: Podiatry

## 2020-12-03 NOTE — Progress Notes (Signed)
Subjective: Michelle Lopez is a 75 y.o. female patient seen today for follow up of  painful thick toenails that are difficult to trim. Pain interferes with ambulation. Aggravating factors include wearing enclosed shoe gear. Pain is relieved with periodic professional debridement.  New problems reported today: None.  Patient did not check blood glucose on today. Her daughter is present during today's visit. Daughter states her Mom had a pedicure while she was visiting her other daughter's home. Daughter states that will not happen again.  PCP is Janith Lima, MD. Last visit was: 04/14/2020.  Allergies  Allergen Reactions   Amlodipine Other (See Comments)    Ankle edema   Crestor [Rosuvastatin Calcium]     myalgias   Morphine Hives   Pravastatin Sodium Rash   Simvastatin Rash    Objective: Physical Exam  General: Patient is a pleasant 75 y.o. African American female WD, WN in NAD. AAO x 3.   Neurovascular Examination: Capillary refill time to digits immediate b/l. Palpable DP pulse(s) b/l lower extremities Palpable PT pulse(s) b/l lower extremities Pedal hair sparse. Lower extremity skin temperature gradient within normal limits. No pain with calf compression b/l. No edema noted b/l LE.  Protective sensation intact 5/5 intact bilaterally with 10g monofilament b/l. Vibratory sensation intact b/l.  Dermatological:  Pedal skin is warm and supple b/l LE. No open wounds b/l LE. No interdigital macerations noted b/l LE. Toenails 1-5 b/l elongated, discolored, dystrophic, thickened, crumbly with subungual debris and tenderness to dorsal palpation.  Musculoskeletal:  Normal muscle strength 5/5 to all lower extremity muscle groups bilaterally. Hammertoe(s) noted to the L 2nd toe, L 3rd toe, R 2nd toe, and R 3rd toe.  Assessment: 1. Pain due to onychomycosis of toenails of both feet   2. Type 2 diabetes mellitus with diabetic nephropathy, without long-term current use of insulin ([place])     Plan: Patient was evaluated and treated and all questions answered. Consent given for treatment as described below: -No new findings. No new orders. -Continue diabetic foot care principles: inspect feet daily, monitor glucose as recommended by PCP and/or Endocrinologist, and follow prescribed diet per PCP, Endocrinologist and/or dietician. -Toenails 1-5 b/l were debrided in length and girth with sterile nail nippers and dremel without iatrogenic bleeding.  -Patient/POA to call should there be question/concern in the interim.  Return in about 3 months (around 02/26/2021).  Marzetta Board, DPM

## 2020-12-06 ENCOUNTER — Other Ambulatory Visit: Payer: Medicaid Other

## 2020-12-06 ENCOUNTER — Other Ambulatory Visit: Payer: Self-pay

## 2020-12-06 DIAGNOSIS — Z515 Encounter for palliative care: Secondary | ICD-10-CM

## 2020-12-06 NOTE — Progress Notes (Signed)
PATIENT NAME: Michelle Lopez DOB: 02-Sep-1945 MRN: [id number]  PRIMARY CARE PROVIDER: Janith Lima, MD  RESPONSIBLE PARTY:  Acct ID - Guarantor Home Phone Work Phone Relationship Acct Type  [id number] - [place],[id number]  Self P/F     [address], [place], [place], [place] [address]   Due to the COVID-19 crisis, this visit was done via telemedicine from my office and it was initiated and consent by this patient and or family.  I connected with  Michelle Lopez OR Michelle Lopez-caregiver on 12/06/20 by a video enabled telemedicine application and verified that I am speaking with the correct person using two identifiers.   I discussed the limitations of evaluation and management by telemedicine. The patient expressed understanding and agreed to proceed.   PLAN OF CARE and INTERVENTIONS:               1.  GOALS OF CARE/ ADVANCE CARE PLANNING:  Remain home and comfortable under the care of her private caregiver.                2.  PATIENT/CAREGIVER EDUCATION:  Orthostatic Hypotension.               4. PERSONAL EMERGENCY PLAN:  Activate 911 for emergencies.               5.  DISEASE STATUS:  Connected telephonically with Michelle Lopez and primary caregiver for patient.   Patient's appetite has been fair.  Eating breakfast, afternoon snack and then dinner.  No weight changes are reported.   No falls reported.  Patient is using a rolling walker in the home and a cane when she is out.  Caregiver is taking patient out for shopping and appointments.  Some dizziness reported when first getting up.  Patient is sitting on the edge of the bed for about 10 minutes before standing.  Education provided on orthostatic hypotension.    No issues with pain.  Denies issues with insomnia.  Patient is sleeping until 1030-11 am.    Michelle Lopez is obtaining blood pressures and blood sugars daily.  Readings have been within normal limits.     Patient is being followed by podiatry for routine  clipping and feet checks due to diabetes.  Overall, patient has been maintaining well over the last several months.  Michelle Lopez is agreeable to continue with Palliative Care check-ins every couple of months.      HISTORY OF PRESENT ILLNESS:  75 year old female with a hx of CVA, HTN and DM.  Patient is being followed by Palliative Care every 8-12 weeks and PRN.  CODE STATUS: Full ADVANCED DIRECTIVES: No MOST FORM: No PPS: 50%        Michelle Burton, RN

## 2021-01-05 ENCOUNTER — Other Ambulatory Visit: Payer: Self-pay | Admitting: Internal Medicine

## 2021-01-05 DIAGNOSIS — E1121 Type 2 diabetes mellitus with diabetic nephropathy: Secondary | ICD-10-CM

## 2021-01-05 DIAGNOSIS — I10 Essential (primary) hypertension: Secondary | ICD-10-CM

## 2021-01-05 DIAGNOSIS — E785 Hyperlipidemia, unspecified: Secondary | ICD-10-CM

## 2021-01-05 DIAGNOSIS — I739 Peripheral vascular disease, unspecified: Secondary | ICD-10-CM

## 2021-03-08 ENCOUNTER — Other Ambulatory Visit: Payer: Self-pay | Admitting: Internal Medicine

## 2021-03-08 DIAGNOSIS — E1121 Type 2 diabetes mellitus with diabetic nephropathy: Secondary | ICD-10-CM

## 2021-03-11 ENCOUNTER — Encounter: Payer: Self-pay | Admitting: Podiatry

## 2021-03-11 ENCOUNTER — Other Ambulatory Visit: Payer: Self-pay | Admitting: Internal Medicine

## 2021-03-11 ENCOUNTER — Ambulatory Visit (INDEPENDENT_AMBULATORY_CARE_PROVIDER_SITE_OTHER): Payer: Medicaid Other | Admitting: Podiatry

## 2021-03-11 ENCOUNTER — Other Ambulatory Visit: Payer: Self-pay

## 2021-03-11 ENCOUNTER — Telehealth: Payer: Self-pay | Admitting: Internal Medicine

## 2021-03-11 DIAGNOSIS — M2042 Other hammer toe(s) (acquired), left foot: Secondary | ICD-10-CM

## 2021-03-11 DIAGNOSIS — E1121 Type 2 diabetes mellitus with diabetic nephropathy: Secondary | ICD-10-CM

## 2021-03-11 DIAGNOSIS — M79674 Pain in right toe(s): Secondary | ICD-10-CM | POA: Diagnosis not present

## 2021-03-11 DIAGNOSIS — B351 Tinea unguium: Secondary | ICD-10-CM | POA: Diagnosis not present

## 2021-03-11 DIAGNOSIS — M2041 Other hammer toe(s) (acquired), right foot: Secondary | ICD-10-CM

## 2021-03-11 DIAGNOSIS — E119 Type 2 diabetes mellitus without complications: Secondary | ICD-10-CM

## 2021-03-11 DIAGNOSIS — R600 Localized edema: Secondary | ICD-10-CM

## 2021-03-11 DIAGNOSIS — M79675 Pain in left toe(s): Secondary | ICD-10-CM

## 2021-03-11 DIAGNOSIS — E1151 Type 2 diabetes mellitus with diabetic peripheral angiopathy without gangrene: Secondary | ICD-10-CM

## 2021-03-11 MED ORDER — TOUJEO MAX SOLOSTAR 300 UNIT/ML ~~LOC~~ SOPN: 20.0000 [IU] | PEN_INJECTOR | Freq: Every day | SUBCUTANEOUS | 0 refills | Status: DC

## 2021-03-11 NOTE — Telephone Encounter (Signed)
Pt requesting a short supply of insulin glargine, 2 Unit Dial, (TOUJEO MAX SOLOSTAR) 300 UNIT/ML Solostar Pen until next ov on 05-02-2021, pt states she will be out of medication next week  Pt last seen by Dr. Jenny Reichmann on 08-10-2020  Pharmacy [place], [place] - [address]

## 2021-03-11 NOTE — Patient Instructions (Signed)
Discontinue use of all leather shoes.  Puma sneakers are causing pressure spots on her feet which could lead to wounds.  Recommend Skechers Loafers with STRETCHABLE UPPERS and memory foam insoles. They can be purchased at [employer] or [email or web address].

## 2021-03-18 NOTE — Progress Notes (Signed)
ANNUAL DIABETIC FOOT EXAM  Subjective: Michelle Lopez presents today for annual diabetic foot examination, at risk foot care with history of diabetic neuropathy, and painful elongated mycotic toenails 1-5 bilaterally which are tender when wearing enclosed shoe gear. Pain is relieved with periodic professional debridement.  Patient relates 8-9 year h/o diabetes.  Patient denies any h/o foot wounds.  Patient admits symptoms of burning in feet in the a.m.  Patient admits symptoms of pins/needles sensation in feet.  Patient denies any numbness, tingling, or pins/needle sensation in feet.  Patient's blood sugar was 126 mg/dl yesterday. Patient did not check blood glucose this morning.  Risk factors: diabetes, PAD, CKD, hyperlipidemia, h/o CVA, HTN.  Janith Lima, MD is patient's PCP. Last visit was March 11, 2021.  Past Medical History:  Diagnosis Date   Cerebrovascular disease, unspecified    Cocaine abuse, unspecified    Diabetes mellitus    type 2, uncontrolled   Diarrhea    Essential hypertension, benign    Hyperlipidemia    Stroke [facility])    x7   Patient Active Problem List   Diagnosis Date Noted   Nail disorder 08/10/2020   Pain, dental 08/10/2020   Angioedema of lips 06/02/2020   Pedal edema 06/02/2020   Stage 3b chronic kidney disease ([place]) 04/14/2020   Tinea corporis 12/17/2018   Protein-calorie malnutrition, mild ([place]) 10/17/2017   B12 deficiency 08/23/2017   Primary osteoarthritis of right hip 04/03/2017   PAD (peripheral artery disease) ([place]) 07/24/2016   Vitamin D deficiency 07/06/2015   Osteopenia, senile 03/03/2014   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 03/21/2013   Seborrheic dermatitis 03/05/2013   Dysphagia as late effect of stroke 05/30/2011   Dementia arising in the senium and presenium ([place]) 05/02/2011   Routine general medical examination at a health care facility 05/02/2011   Hyperlipidemia with target LDL less than 70    Visit for  screening mammogram 09/15/2010   DEPRESSION 06/24/2009   VERTIGO, CENTRAL 01/14/2009   Insomnia 01/14/2009   CVA (cerebral vascular accident) ([place]) 06/11/2008   DM (diabetes mellitus), type 2 with renal complications ([place]) 15/72/6203   Hypertension, uncontrolled 06/05/2007   Past Surgical History:  Procedure Laterality Date   TOTAL HIP ARTHROPLASTY  1970   Dr. Rodell Perna   Current Outpatient Medications on File Prior to Visit  Medication Sig Dispense Refill   aspirin 81 MG tablet Take 81 mg by mouth daily.     atorvastatin (LIPITOR) 20 MG tablet TAKE 1 TABLET BY MOUTH EVERY DAY 90 tablet 0   Blood Glucose Monitoring Suppl (CONTOUR NEXT EZ) w/Device KIT 1 Act by Does not apply route 2 (two) times daily. 1 kit 0   CLICKFINE PEN NEEDLES 31G X 6 MM MISC USE TO INJECT insulin UP TO 5 TIMES DAILY 300 each 1   clopidogrel (PLAVIX) 75 MG tablet TAKE 1 TABLET BY MOUTH EVERY DAY 90 tablet 0   dapagliflozin propanediol (FARXIGA) 10 MG TABS tablet Take 1 tablet (10 mg total) by mouth daily before breakfast. 90 tablet 1   glucose blood (ONETOUCH VERIO) test strip Use to check blood sugars twice a day 300 each 1   hydrochlorothiazide (MICROZIDE) 12.5 MG capsule Take 1 capsule (12.5 mg total) by mouth daily. 90 capsule 3   insulin aspart (NOVOLOG) 100 UNIT/ML FlexPen Inject 0-9 Units into the skin 3 (three) times daily with meals. 15 mL 0   Lancets (ONETOUCH ULTRASOFT) lancets Use to check blood sugars twice a day 300  each 1   metoprolol tartrate (LOPRESSOR) 25 MG tablet TAKE 1/2 TABLET BY MOUTH 2 TIMES DAILY 90 tablet 0   predniSONE (DELTASONE) 10 MG tablet 3 tabs by mouth per day for 3 days,2tabs per day for 3 days,1tab per day for 3 days 18 tablet 0   RESTASIS 0.05 % ophthalmic emulsion Place 1 drop into both eyes daily.     tamsulosin (FLOMAX) 0.4 MG CAPS capsule TAKE 1 CAPSULE BY MOUTH EVERY DAY 90 capsule 1   Vitamin D, Ergocalciferol, (DRISDOL) 1.25 MG (50000 UNIT) CAPS capsule TAKE 1 CAPSULE BY  MOUTH once weekly 54 capsule 0   No current facility-administered medications on file prior to visit.    Allergies  Allergen Reactions   Amlodipine Other (See Comments)    Ankle edema   Crestor [Rosuvastatin Calcium]     myalgias   Morphine Hives   Pravastatin Sodium Rash   Simvastatin Rash   Social History   Occupational History   Occupation: Disability  Tobacco Use   Smoking status: Never   Smokeless tobacco: Never  Vaping Use   Vaping Use: Never used  Substance and Sexual Activity   Alcohol use: No    Alcohol/week: 0.0 standard drinks   Drug use: No   Sexual activity: Not Currently   Family History  Problem Relation Age of Onset   Colon cancer Neg Hx    Immunization History  Administered Date(s) Administered   Fluad Quad(high Dose 65+) 01/07/2019   Influenza Whole 01/14/2009, 11/16/2009   Influenza, High Dose Seasonal PF 12/14/2014, 11/27/2016, 10/17/2017   Influenza,inj,Quad PF,6+ Mos 10/31/2013   Influenza-Unspecified 12/07/2012   PFIZER(Purple Top)SARS-COV-2 Vaccination 09/04/2019, 10/07/2019   Pneumococcal Conjugate-13 06/23/2014   Pneumococcal Polysaccharide-23 12/24/2008, 08/23/2017   Td 11/16/2009   Zoster, Live 10/03/2012     Review of Systems: Negative except as noted in the HPI.   Objective: There were no vitals filed for this visit.  Michelle Lopez is a pleasant 76 y.o. female in NAD. AAO X 3.  Vascular Examination: CFT <3 seconds b/l LE. Diminished pedal pulses b/l LE. Pedal hair absent. No pain with calf compression b/l. Lower extremity skin temperature gradient within normal limits. Unilateral edema left LE. No cyanosis or clubbing noted b/l LE.  Dermatological Examination: Pedal integument with normal turgor, texture and tone b/l LE. No open wounds b/l. No interdigital macerations b/l. Toenails 1-5 b/l elongated, thickened, discolored with subungual debris. +Tenderness with dorsal palpation of nailplates. No hyperkeratotic or porokeratotic  lesions present.  Musculoskeletal Examination: Normal muscle strength 5/5 to all lower extremity muscle groups bilaterally. Hammertoe(s) noted to the bilateral 2nd toes and bilateral 3rd toes.. No pain, crepitus or joint limitation noted with ROM b/l LE.  Patient ambulates independently without assistive aids. Wearing ill fitting shoe gear.  Footwear Assessment: Does the patient wear appropriate shoes? No. Does the patient need inserts/orthotics? No.  Neurological Examination: Protective sensation intact 5/5 intact bilaterally with 10g monofilament b/l. Proprioception intact bilaterally.  Hemoglobin A1C Latest Ref Rng & Units 04/14/2020  HGBA1C 4.6 - 6.5 % 8.1(H)  Some recent data might be hidden   Assessment: 1. Pain due to onychomycosis of toenails of both feet   2. Acquired hammertoes of both feet   3. Unilateral edema of lower extremity   4. Type II diabetes mellitus with peripheral circulatory disorder (HCC)   5. Encounter for diabetic foot exam ([place])     ADA Risk Categorization: High Risk  Patient has one or more of the following:  Loss of protective sensation Absent pedal pulses Severe Foot deformity History of foot ulcer  Plan: -Instructed Ms. Mazurowski to discontinue wearing leather Pumas sneakers as it is causing pressure on her left 5th metatarsal head. She related understanding. -Diabetic foot examination performed today. -Continue foot and shoe inspections daily. Monitor blood glucose per PCP/Endocrinologist's recommendations. -Mycotic toenails 1-5 bilaterally were debrided in length and girth with sterile nail nippers and dremel without incident. -Recommended Skechers shoes with stretchable uppers and memory foam insoles. -Patient/POA to call should there be question/concern in the interim.  Return in about 3 months (around 06/08/2021).  Marzetta Board, DPM

## 2021-04-04 ENCOUNTER — Other Ambulatory Visit: Payer: Self-pay | Admitting: Internal Medicine

## 2021-04-04 DIAGNOSIS — E785 Hyperlipidemia, unspecified: Secondary | ICD-10-CM

## 2021-04-04 DIAGNOSIS — I739 Peripheral vascular disease, unspecified: Secondary | ICD-10-CM

## 2021-04-04 DIAGNOSIS — I10 Essential (primary) hypertension: Secondary | ICD-10-CM

## 2021-04-04 DIAGNOSIS — E1121 Type 2 diabetes mellitus with diabetic nephropathy: Secondary | ICD-10-CM

## 2021-04-14 ENCOUNTER — Other Ambulatory Visit: Payer: Self-pay | Admitting: Internal Medicine

## 2021-04-14 DIAGNOSIS — I10 Essential (primary) hypertension: Secondary | ICD-10-CM

## 2021-05-02 ENCOUNTER — Ambulatory Visit: Payer: Medicaid Other

## 2021-05-02 ENCOUNTER — Other Ambulatory Visit: Payer: Self-pay | Admitting: Internal Medicine

## 2021-05-02 ENCOUNTER — Other Ambulatory Visit: Payer: Self-pay

## 2021-05-02 ENCOUNTER — Ambulatory Visit (INDEPENDENT_AMBULATORY_CARE_PROVIDER_SITE_OTHER): Payer: Medicare Other

## 2021-05-02 ENCOUNTER — Ambulatory Visit (INDEPENDENT_AMBULATORY_CARE_PROVIDER_SITE_OTHER): Payer: Medicare Other | Admitting: Internal Medicine

## 2021-05-02 ENCOUNTER — Encounter: Payer: Self-pay | Admitting: Internal Medicine

## 2021-05-02 VITALS — BP 138/82 | HR 69 | Temp 98.2°F | Ht 60.0 in | Wt 145.0 lb

## 2021-05-02 DIAGNOSIS — E785 Hyperlipidemia, unspecified: Secondary | ICD-10-CM | POA: Diagnosis not present

## 2021-05-02 DIAGNOSIS — R051 Acute cough: Secondary | ICD-10-CM | POA: Diagnosis not present

## 2021-05-02 DIAGNOSIS — M8580 Other specified disorders of bone density and structure, unspecified site: Secondary | ICD-10-CM

## 2021-05-02 DIAGNOSIS — N1832 Chronic kidney disease, stage 3b: Secondary | ICD-10-CM

## 2021-05-02 DIAGNOSIS — E559 Vitamin D deficiency, unspecified: Secondary | ICD-10-CM | POA: Diagnosis not present

## 2021-05-02 DIAGNOSIS — E538 Deficiency of other specified B group vitamins: Secondary | ICD-10-CM

## 2021-05-02 DIAGNOSIS — E1121 Type 2 diabetes mellitus with diabetic nephropathy: Secondary | ICD-10-CM | POA: Diagnosis not present

## 2021-05-02 DIAGNOSIS — H9193 Unspecified hearing loss, bilateral: Secondary | ICD-10-CM

## 2021-05-02 DIAGNOSIS — E119 Type 2 diabetes mellitus without complications: Secondary | ICD-10-CM

## 2021-05-02 DIAGNOSIS — Z794 Long term (current) use of insulin: Secondary | ICD-10-CM

## 2021-05-02 LAB — BASIC METABOLIC PANEL
BUN: 28 mg/dL — ABNORMAL HIGH (ref 6–23)
CO2: 33 mEq/L — ABNORMAL HIGH (ref 19–32)
Calcium: 9.9 mg/dL (ref 8.4–10.5)
Chloride: 99 mEq/L (ref 96–112)
Creatinine, Ser: 1.23 mg/dL — ABNORMAL HIGH (ref 0.40–1.20)
GFR: 42.18 mL/min — ABNORMAL LOW (ref >60.00)
Glucose, Bld: 145 mg/dL — ABNORMAL HIGH (ref 70–99)
Potassium: 3.7 mEq/L (ref 3.5–5.1)
Sodium: 138 mEq/L (ref 135–145)

## 2021-05-02 LAB — HEPATIC FUNCTION PANEL
ALT: 11 U/L (ref 0–35)
AST: 20 U/L (ref 0–37)
Albumin: 3.9 g/dL (ref 3.5–5.2)
Alkaline Phosphatase: 77 U/L (ref 39–117)
Bilirubin, Direct: 0 mg/dL (ref 0.0–0.3)
Total Bilirubin: 0.3 mg/dL (ref 0.2–1.2)
Total Protein: 8.1 g/dL (ref 6.0–8.3)

## 2021-05-02 LAB — CBC WITH DIFFERENTIAL/PLATELET
Basophils Absolute: 0.1 10*3/uL (ref 0.0–0.1)
Basophils Relative: 0.9 % (ref 0.0–3.0)
Eosinophils Absolute: 0.1 10*3/uL (ref 0.0–0.7)
Eosinophils Relative: 1.7 % (ref 0.0–5.0)
HCT: 40.6 % (ref 36.0–46.0)
Hemoglobin: 13.4 g/dL (ref 12.0–15.0)
Lymphocytes Relative: 43.3 % (ref 12.0–46.0)
Lymphs Abs: 3.1 10*3/uL (ref 0.7–4.0)
MCHC: 32.9 g/dL (ref 30.0–36.0)
MCV: 79.6 fl (ref 78.0–100.0)
Monocytes Absolute: 0.7 10*3/uL (ref 0.1–1.0)
Monocytes Relative: 9.1 % (ref 3.0–12.0)
Neutro Abs: 3.2 10*3/uL (ref 1.4–7.7)
Neutrophils Relative %: 45 % (ref 43.0–77.0)
Platelets: 230 10*3/uL (ref 150.0–400.0)
RBC: 5.11 Mil/uL (ref 3.87–5.11)
RDW: 13.2 % (ref 11.5–15.5)
WBC: 7.2 10*3/uL (ref 4.0–10.5)

## 2021-05-02 LAB — LIPID PANEL
Cholesterol: 142 mg/dL (ref 0–200)
HDL: 27.2 mg/dL — ABNORMAL LOW (ref >39.00)
LDL Cholesterol: 84 mg/dL (ref 0–99)
NonHDL: 114.98
Total CHOL/HDL Ratio: 5
Triglycerides: 155 mg/dL — ABNORMAL HIGH (ref 0.0–149.0)
VLDL: 31 mg/dL (ref 0.0–40.0)

## 2021-05-02 LAB — POC COVID19 BINAXNOW: SARS Coronavirus 2 Ag: NEGATIVE

## 2021-05-02 LAB — HEMOGLOBIN A1C: Hgb A1c MFr Bld: 8.2 % — ABNORMAL HIGH (ref 4.6–6.5)

## 2021-05-02 NOTE — Patient Instructions (Signed)

## 2021-05-02 NOTE — Progress Notes (Signed)
? ?Subjective:  ?Patient ID: Michelle Lopez, female    DOB: 1945-06-28  Age: 76 y.o. MRN: [id number] ? ?CC: Cough, Anemia, Hyperlipidemia, Diabetes, and Hypertension ? ?This visit occurred during the SARS-CoV-2 public health emergency.  Safety protocols were in place, including screening questions prior to the visit, additional usage of staff PPE, and extensive cleaning of exam room while observing appropriate contact time as indicated for disinfecting solutions.   ? ?HPI ?Michelle Lopez presents for f/up -  ? ?She complains of a 3-day history of cough and runny nose.  The cough is nonproductive and she denies fever, chills, night sweats, chest pain, shortness of breath, dizziness, or lightheadedness. ? ?Outpatient Medications Prior to Visit  ?Medication Sig Dispense Refill  ? aspirin 81 MG tablet Take 81 mg by mouth daily.    ? atorvastatin (LIPITOR) 20 MG tablet TAKE 1 TABLET BY MOUTH EVERY DAY 90 tablet 0  ? Blood Glucose Monitoring Suppl (CONTOUR NEXT EZ) w/Device KIT 1 Act by Does not apply route 2 (two) times daily. 1 kit 0  ? CLICKFINE PEN NEEDLES 31G X 6 MM MISC USE TO INJECT insulin UP TO 5 TIMES DAILY 300 each 1  ? clopidogrel (PLAVIX) 75 MG tablet TAKE 1 TABLET BY MOUTH EVERY DAY 90 tablet 0  ? hydrochlorothiazide (MICROZIDE) 12.5 MG capsule Take 1 capsule (12.5 mg total) by mouth daily. 90 capsule 3  ? insulin glargine, 2 Unit Dial, (TOUJEO MAX SOLOSTAR) 300 UNIT/ML Solostar Pen Inject 20 Units into the skin daily. 6 mL 0  ? Lancets (ONETOUCH ULTRASOFT) lancets Use to check blood sugars twice a day 300 each 1  ? metoprolol tartrate (LOPRESSOR) 25 MG tablet TAKE 1/2 TABLET BY MOUTH 2 TIMES DAILY 90 tablet 0  ? tamsulosin (FLOMAX) 0.4 MG CAPS capsule TAKE 1 CAPSULE BY MOUTH EVERY DAY 90 capsule 0  ? dapagliflozin propanediol (FARXIGA) 10 MG TABS tablet Take 1 tablet (10 mg total) by mouth daily before breakfast. 90 tablet 1  ? glucose blood (ONETOUCH VERIO) test strip Use to check blood sugars twice a day 300  each 1  ? insulin aspart (NOVOLOG) 100 UNIT/ML FlexPen Inject 0-9 Units into the skin 3 (three) times daily with meals. 15 mL 0  ? predniSONE (DELTASONE) 10 MG tablet 3 tabs by mouth per day for 3 days,2tabs per day for 3 days,1tab per day for 3 days 18 tablet 0  ? RESTASIS 0.05 % ophthalmic emulsion Place 1 drop into both eyes daily.    ? Vitamin D, Ergocalciferol, (DRISDOL) 1.25 MG (50000 UNIT) CAPS capsule TAKE 1 CAPSULE BY MOUTH once weekly 54 capsule 0  ? ?No facility-administered medications prior to visit.  ? ? ?ROS ?Review of Systems  ?Constitutional:  Negative for chills, diaphoresis, fatigue and fever.  ?HENT:  Positive for hearing loss, postnasal drip and rhinorrhea. Negative for facial swelling, sore throat and voice change.   ?Respiratory:  Positive for cough. Negative for chest tightness, shortness of breath and wheezing.   ?Cardiovascular:  Negative for chest pain, palpitations and leg swelling.  ?Gastrointestinal:  Negative for abdominal pain, diarrhea, nausea and vomiting.  ?Genitourinary:  Negative for difficulty urinating.  ?Musculoskeletal: Negative.  Negative for arthralgias and myalgias.  ?Skin: Negative.   ?Neurological:  Negative for dizziness and weakness.  ?Hematological:  Negative for adenopathy. Does not bruise/bleed easily.  ?Psychiatric/Behavioral: Negative.    ? ?Objective:  ?BP 138/82 (BP Location: Left Arm, Patient Position: Sitting, Cuff Size: Large)   Pulse 69   Temp  98.2 ?F (36.8 ?C) (Oral)   Ht 5' (1.524 m)   Wt 145 lb (65.8 kg)   SpO2 95%   BMI 28.32 kg/m?  ? ?BP Readings from Last 3 Encounters:  ?05/02/21 138/82  ?08/26/20 (!) 98/54  ?08/10/20 128/64  ? ? ?Wt Readings from Last 3 Encounters:  ?05/02/21 145 lb (65.8 kg)  ?08/10/20 142 lb (64.4 kg)  ?06/02/20 150 lb (68 kg)  ? ? ?Physical Exam ?Vitals reviewed.  ?Constitutional:   ?   General: She is not in acute distress. ?   Appearance: She is not ill-appearing, toxic-appearing or diaphoretic.  ?HENT:  ?   Nose: Nose  normal.  ?   Mouth/Throat:  ?   Mouth: Mucous membranes are moist.  ?Eyes:  ?   General: No scleral icterus. ?   Conjunctiva/sclera: Conjunctivae normal.  ?Cardiovascular:  ?   Rate and Rhythm: Normal rate and regular rhythm.  ?   Heart sounds: No murmur heard. ?Pulmonary:  ?   Effort: Pulmonary effort is normal.  ?   Breath sounds: No stridor. No wheezing, rhonchi or rales.  ?Abdominal:  ?   General: Abdomen is flat.  ?   Palpations: There is no mass.  ?   Tenderness: There is no abdominal tenderness. There is no guarding.  ?   Hernia: No hernia is present.  ?Musculoskeletal:     ?   General: Normal range of motion.  ?   Cervical back: Neck supple.  ?   Right lower leg: No edema.  ?   Left lower leg: No edema.  ?Lymphadenopathy:  ?   Cervical: No cervical adenopathy.  ?Skin: ?   General: Skin is warm and dry.  ?   Coloration: Skin is not pale.  ?   Findings: No rash.  ?Neurological:  ?   Mental Status: She is alert. Mental status is at baseline.  ?Psychiatric:     ?   Mood and Affect: Mood normal.     ?   Behavior: Behavior normal.  ? ? ?Lab Results  ?Component Value Date  ? WBC 7.2 05/02/2021  ? HGB 13.4 05/02/2021  ? HCT 40.6 05/02/2021  ? PLT 230.0 05/02/2021  ? GLUCOSE 145 (H) 05/02/2021  ? CHOL 142 05/02/2021  ? TRIG 155.0 (H) 05/02/2021  ? HDL 27.20 (L) 05/02/2021  ? LDLDIRECT 100.0 07/06/2015  ? [place] 84 05/02/2021  ? ALT 11 05/02/2021  ? AST 20 05/02/2021  ? NA 138 05/02/2021  ? K 3.7 05/02/2021  ? CL 99 05/02/2021  ? CREATININE 1.23 (H) 05/02/2021  ? BUN 28 (H) 05/02/2021  ? CO2 33 (H) 05/02/2021  ? TSH 1.35 05/02/2021  ? INR 0.9 04/03/2019  ? HGBA1C 8.2 (H) 05/02/2021  ? MICROALBUR <0.7 07/31/2018  ? ? ?US RENAL ? ?Result Date: 04/04/2019 ?CLINICAL DATA:  Acute kidney injury. COVID-19 infection. Altered mental status. EXAM: RENAL / URINARY TRACT ULTRASOUND COMPLETE COMPARISON:  Renal ultrasound 06/19/2018 FINDINGS: Right Kidney: Renal measurements: 10.1 x 6.0 x 5.2 cm = volume: 164.5 mL. There is  cortical thinning with increased echogenicity. A small cyst is present in the interpolar region, measuring 15 mm maximally. No hydronephrosis. Left Kidney: Renal measurements: 11.2 x 6.7 x 4.9 cm = volume: 194.7 mL. Mild cortical thinning. No hydronephrosis. Bladder: Appears normal for degree of bladder distention. Other: Examination is mildly limited by the patient's mental status and bowel gas. IMPRESSION: 1. Both kidneys are mildly enlarged compared with the prior ultrasound, suggesting acute tubular  necrosis or interstitial nephritis. 2. No hydronephrosis or perinephric fluid collection. Electronically Signed   By: Richardean Sale M.D.   On: 04/04/2019 12:10  ? ?DG CHEST PORT 1 VIEW ? ?Result Date: 04/04/2019 ?CLINICAL DATA:  Shortness of breath. EXAM: PORTABLE CHEST 1 VIEW COMPARISON:  Twenty-fifth 2021. FINDINGS: Stable cardiomediastinal silhouette. Atherosclerosis of thoracic aorta is noted. No pneumothorax or pleural effusion is noted. No pneumothorax or pleural effusion is noted. IMPRESSION: No active disease. Aortic Atherosclerosis (ICD10-I70.0). Electronically Signed   By: Marijo Conception M.D.   On: 04/04/2019 08:48  ? ?DG Chest Port 1 View ? ?Result Date: 04/03/2019 ?CLINICAL DATA:  Body aches, and sinus congestion and cough for 3 days. EXAM: PORTABLE CHEST 1 VIEW COMPARISON:  PA and lateral chest 02/25/2015. FINDINGS: The lung volumes are low. Lungs clear. No pneumothorax or pleural effusion. Heart size is normal. Atherosclerosis. No acute or focal bony abnormality. IMPRESSION: No acute disease. Atherosclerosis. Electronically Signed   By: Inge Rise M.D.   On: 04/03/2019 11:11  ? ?DG Chest 2 View ? ?Result Date: 05/02/2021 ?CLINICAL DATA:  Dry productive cough for 2 days, history diabetes mellitus EXAM: CHEST - 2 VIEW COMPARISON:  05/05/2019 FINDINGS: Upper normal heart size. Mediastinal contours and pulmonary vascularity normal. Chronic accentuation of LEFT basilar markings unchanged. Tiny  calcified granulomata primarily LEFT lung. No definite acute infiltrate, pleural effusion, or pneumothorax. Scattered endplate spur formation thoracic spine. IMPRESSION: Old granulomatous disease and LEFT basilar scarring. No

## 2021-05-03 ENCOUNTER — Encounter: Payer: Self-pay | Admitting: Internal Medicine

## 2021-05-03 ENCOUNTER — Telehealth: Payer: Self-pay

## 2021-05-03 LAB — URINALYSIS, ROUTINE W REFLEX MICROSCOPIC
Bilirubin Urine: NEGATIVE
Hgb urine dipstick: NEGATIVE
Ketones, ur: NEGATIVE
Nitrite: NEGATIVE
Specific Gravity, Urine: 1.025 (ref 1.000–1.030)
Total Protein, Urine: NEGATIVE
Urine Glucose: NEGATIVE
Urobilinogen, UA: 0.2 (ref 0.0–1.0)
pH: 5.5 (ref 5.0–8.0)

## 2021-05-03 LAB — FOLATE: Folate: >24.2 ng/mL (ref >5.9)

## 2021-05-03 LAB — VITAMIN D 25 HYDROXY (VIT D DEFICIENCY, FRACTURES): VITD: 114.55 ng/mL (ref 30.00–100.00)

## 2021-05-03 LAB — VITAMIN B12: Vitamin B-12: 260 pg/mL (ref 211–911)

## 2021-05-03 LAB — TSH: TSH: 1.35 u[IU]/mL (ref 0.35–5.50)

## 2021-05-03 MED ORDER — DAPAGLIFLOZIN PROPANEDIOL 10 MG PO TABS: 10.0000 mg | ORAL_TABLET | Freq: Every day | ORAL | 1 refills | Status: DC

## 2021-05-03 NOTE — Telephone Encounter (Signed)
CRITICAL VALUE STICKER ? ?CRITICAL VALUE: Vitamin D 114.55 ? ?RECEIVER (on-site recipient of call): Jarrett Soho ? ?DATE & TIME NOTIFIED:  05/03/21 at 12:15 pm ? ?MESSENGER (representative from lab): Hope ? ?MD NOTIFIED:  Dr.Jones ? ?TIME OF NOTIFICATION: 12:16 pm ? ?

## 2021-05-03 NOTE — Telephone Encounter (Signed)
Pt's daughter Michelle Lopez has been informed.  ?

## 2021-05-05 MED ORDER — SHINGRIX 50 MCG/0.5ML IM SUSR: 0.5000 mL | Freq: Once | INTRAMUSCULAR | 1 refills | Status: AC

## 2021-05-23 ENCOUNTER — Other Ambulatory Visit: Payer: Self-pay | Admitting: Internal Medicine

## 2021-05-23 DIAGNOSIS — E1121 Type 2 diabetes mellitus with diabetic nephropathy: Secondary | ICD-10-CM

## 2021-06-09 ENCOUNTER — Other Ambulatory Visit: Payer: Self-pay | Admitting: Internal Medicine

## 2021-06-09 DIAGNOSIS — I739 Peripheral vascular disease, unspecified: Secondary | ICD-10-CM

## 2021-06-09 DIAGNOSIS — E1121 Type 2 diabetes mellitus with diabetic nephropathy: Secondary | ICD-10-CM

## 2021-06-09 DIAGNOSIS — I10 Essential (primary) hypertension: Secondary | ICD-10-CM

## 2021-06-09 DIAGNOSIS — E785 Hyperlipidemia, unspecified: Secondary | ICD-10-CM

## 2021-06-10 ENCOUNTER — Ambulatory Visit (INDEPENDENT_AMBULATORY_CARE_PROVIDER_SITE_OTHER): Payer: Medicaid Other | Admitting: Podiatry

## 2021-06-10 DIAGNOSIS — Z91199 Patient's noncompliance with other medical treatment and regimen due to unspecified reason: Secondary | ICD-10-CM

## 2021-06-11 NOTE — Progress Notes (Signed)
   Complete physical exam  Patient: Michelle Lopez   DOB: 11/26/2000   76 y.o. Female  MRN: [id number]  Subjective:    No chief complaint on file.   Michelle Lopez is a 76 y.o. female who presents today for a complete physical exam. She reports consuming a {diet types:17450} diet. {types:19826} She generally feels {DESC; WELL/FAIRLY WELL/POORLY:18703}. She reports sleeping {DESC; WELL/FAIRLY WELL/POORLY:18703}. She {does/does not:200015} have additional problems to discuss today.    Most recent fall risk assessment:    08/03/2021   10:42 AM  Fall Risk   Falls in the past year? 0  Number falls in past yr: 0  Injury with Fall? 0  Risk for fall due to : No Fall Risks  Follow up Falls evaluation completed     Most recent depression screenings:    08/03/2021   10:42 AM 06/24/2020   10:46 AM  PHQ 2/9 Scores  PHQ - 2 Score 0 0  PHQ- 9 Score 5     {VISON DENTAL STD PSA (Optional):27386}  {History (Optional):23778}  Patient Care Team: Jessup, Joy, NP as PCP - General (Nurse Practitioner)   Outpatient Medications Prior to Visit  Medication Sig   fluticasone (FLONASE) 50 MCG/ACT nasal spray Place 2 sprays into both nostrils in the morning and at bedtime. After 7 days, reduce to once daily.   norgestimate-ethinyl estradiol (SPRINTEC 28) 0.25-35 MG-MCG tablet Take 1 tablet by mouth daily.   Nystatin POWD Apply liberally to affected area 2 times per day   spironolactone (ALDACTONE) 100 MG tablet Take 1 tablet (100 mg total) by mouth daily.   No facility-administered medications prior to visit.    ROS        Objective:     There were no vitals taken for this visit. {Vitals History (Optional):23777}  Physical Exam   No results found for any visits on 09/08/21. {Show previous labs (optional):23779}    Assessment & Plan:    Routine Health Maintenance and Physical Exam  Immunization History  Administered Date(s) Administered   DTaP 02/09/1999, 04/07/1999,  06/16/1999, 03/01/2000, 09/15/2003   Hepatitis A 07/12/2007, 07/17/2008   Hepatitis B 11/27/1998, 01/04/1999, 06/16/1999   HiB (PRP-OMP) 02/09/1999, 04/07/1999, 06/16/1999, 03/01/2000   IPV 02/09/1999, 04/07/1999, 12/05/1999, 09/15/2003   Influenza,inj,Quad PF,6+ Mos 10/17/2013   Influenza-Unspecified 01/17/2012   MMR 12/04/2000, 09/15/2003   Meningococcal Polysaccharide 07/17/2011   Pneumococcal Conjugate-13 03/01/2000   Pneumococcal-Unspecified 06/16/1999, 08/30/1999   Tdap 07/17/2011   Varicella 12/05/1999, 07/12/2007    Health Maintenance  Topic Date Due   HIV Screening  Never done   Hepatitis C Screening  Never done   INFLUENZA VACCINE  09/06/2021   PAP-Cervical Cytology Screening  09/08/2021 (Originally 11/26/2019)   PAP SMEAR-Modifier  09/08/2021 (Originally 11/26/2019)   TETANUS/TDAP  09/08/2021 (Originally 07/16/2021)   HPV VACCINES  Discontinued   COVID-19 Vaccine  Discontinued    Discussed health benefits of physical activity, and encouraged her to engage in regular exercise appropriate for her age and condition.  Problem List Items Addressed This Visit   None Visit Diagnoses     Annual physical exam    -  Primary   Cervical cancer screening       Need for Tdap vaccination          No follow-ups on file.     Joy Jessup, NP   

## 2021-06-13 ENCOUNTER — Encounter: Payer: Self-pay | Admitting: Internal Medicine

## 2021-06-13 ENCOUNTER — Ambulatory Visit (INDEPENDENT_AMBULATORY_CARE_PROVIDER_SITE_OTHER): Payer: 59 | Admitting: Internal Medicine

## 2021-06-13 VITALS — BP 136/72 | HR 70 | Temp 97.9°F | Ht 60.0 in | Wt 149.0 lb

## 2021-06-13 DIAGNOSIS — H9193 Unspecified hearing loss, bilateral: Secondary | ICD-10-CM

## 2021-06-13 DIAGNOSIS — N1832 Chronic kidney disease, stage 3b: Secondary | ICD-10-CM

## 2021-06-13 DIAGNOSIS — E1121 Type 2 diabetes mellitus with diabetic nephropathy: Secondary | ICD-10-CM

## 2021-06-13 DIAGNOSIS — Z23 Encounter for immunization: Secondary | ICD-10-CM

## 2021-06-13 MED ORDER — BOOSTRIX 5-2.5-18.5 LF-MCG/0.5 IM SUSP: 0.5000 mL | Freq: Once | INTRAMUSCULAR | 0 refills | Status: AC

## 2021-06-13 MED ORDER — SHINGRIX 50 MCG/0.5ML IM SUSR: 0.5000 mL | Freq: Once | INTRAMUSCULAR | 1 refills | Status: AC

## 2021-06-13 NOTE — Progress Notes (Signed)
? ?Subjective:  ?Patient ID: Michelle Lopez, female    DOB: 12/15/1945  Age: 76 y.o. MRN: [id number] ? ?CC: Hypertension and Diabetes ? ? ?HPI ?Michelle Lopez presents for f/up - No new complaints. ? ?Outpatient Medications Prior to Visit  ?Medication Sig Dispense Refill  ? aspirin 81 MG tablet Take 81 mg by mouth daily.    ? atorvastatin (LIPITOR) 20 MG tablet TAKE 1 TABLET BY MOUTH EVERY DAY 90 tablet 0  ? Blood Glucose Monitoring Suppl (CONTOUR NEXT EZ) w/Device KIT 1 Act by Does not apply route 2 (two) times daily. 1 kit 0  ? CLICKFINE PEN NEEDLES 31G X 6 MM MISC USE TO INJECT insulin UP TO 5 TIMES DAILY 300 each 1  ? clopidogrel (PLAVIX) 75 MG tablet TAKE 1 TABLET BY MOUTH EVERY DAY 90 tablet 0  ? dapagliflozin propanediol (FARXIGA) 10 MG TABS tablet Take 1 tablet (10 mg total) by mouth daily before breakfast. 90 tablet 1  ? Lancets (ONETOUCH ULTRASOFT) lancets Use to check blood sugars twice a day 300 each 1  ? metoprolol tartrate (LOPRESSOR) 25 MG tablet TAKE 1/2 TABLET BY MOUTH 2 TIMES DAILY 90 tablet 0  ? tamsulosin (FLOMAX) 0.4 MG CAPS capsule TAKE 1 CAPSULE BY MOUTH EVERY DAY 90 capsule 0  ? TOUJEO MAX SOLOSTAR 300 UNIT/ML Solostar Pen Inject 20 UNITS into THE SKIN daily 6 mL 0  ? hydrochlorothiazide (MICROZIDE) 12.5 MG capsule Take 1 capsule (12.5 mg total) by mouth daily. 90 capsule 3  ? ?No facility-administered medications prior to visit.  ? ? ?ROS ?Review of Systems  ?Constitutional:  Negative for chills and diaphoresis.  ?HENT:  Positive for hearing loss. Negative for ear pain.   ?Eyes: Negative.   ?Respiratory:  Negative for cough, chest tightness, shortness of breath and wheezing.   ?Cardiovascular:  Negative for chest pain, palpitations and leg swelling.  ?Gastrointestinal:  Negative for abdominal pain, constipation, diarrhea, nausea and vomiting.  ?Endocrine: Negative.   ?Genitourinary: Negative.  Negative for difficulty urinating.  ?Musculoskeletal:  Positive for arthralgias. Negative for back pain  and myalgias.  ?Neurological:  Negative for dizziness, weakness, light-headedness and headaches.  ?Hematological:  Negative for adenopathy. Does not bruise/bleed easily.  ?Psychiatric/Behavioral: Negative.    ? ?Objective:  ?BP 136/72 (BP Location: Right Arm, Patient Position: Sitting, Cuff Size: Large)   Pulse 70   Temp 97.9 ?F (36.6 ?C) (Oral)   Ht 5' (1.524 m)   Wt 149 lb (67.6 kg)   SpO2 92%   BMI 29.10 kg/m?  ? ?BP Readings from Last 3 Encounters:  ?06/13/21 136/72  ?05/02/21 138/82  ?08/26/20 (!) 98/54  ? ? ?Wt Readings from Last 3 Encounters:  ?06/13/21 149 lb (67.6 kg)  ?05/02/21 145 lb (65.8 kg)  ?08/10/20 142 lb (64.4 kg)  ? ? ?Physical Exam ?Vitals reviewed.  ?HENT:  ?   Right Ear: Tympanic membrane, ear canal and external ear normal. Decreased hearing noted.  ?   Left Ear: Tympanic membrane, ear canal and external ear normal. Decreased hearing noted.  ?   Nose: Nose normal.  ?   Mouth/Throat:  ?   Mouth: Mucous membranes are moist.  ?Eyes:  ?   General: No scleral icterus. ?   Conjunctiva/sclera: Conjunctivae normal.  ?Cardiovascular:  ?   Rate and Rhythm: Normal rate and regular rhythm.  ?   Heart sounds: No murmur heard. ?Pulmonary:  ?   Effort: Pulmonary effort is normal.  ?   Breath sounds: No stridor. No  wheezing, rhonchi or rales.  ?Abdominal:  ?   General: Abdomen is flat.  ?   Palpations: There is no mass.  ?   Tenderness: There is no abdominal tenderness. There is no guarding.  ?Musculoskeletal:     ?   General: Normal range of motion.  ?   Cervical back: Neck supple.  ?   Right lower leg: No edema.  ?   Left lower leg: No edema.  ?Lymphadenopathy:  ?   Cervical: No cervical adenopathy.  ?Skin: ?   General: Skin is warm and dry.  ?Neurological:  ?   Mental Status: She is alert. Mental status is at baseline.  ?Psychiatric:     ?   Mood and Affect: Mood normal.     ?   Behavior: Behavior normal.  ? ? ?Lab Results  ?Component Value Date  ? WBC 7.2 05/02/2021  ? HGB 13.4 05/02/2021  ? HCT 40.6  05/02/2021  ? PLT 230.0 05/02/2021  ? GLUCOSE 145 (H) 05/02/2021  ? CHOL 142 05/02/2021  ? TRIG 155.0 (H) 05/02/2021  ? HDL 27.20 (L) 05/02/2021  ? LDLDIRECT 100.0 07/06/2015  ? [place] 84 05/02/2021  ? ALT 11 05/02/2021  ? AST 20 05/02/2021  ? NA 138 05/02/2021  ? K 3.7 05/02/2021  ? CL 99 05/02/2021  ? CREATININE 1.23 (H) 05/02/2021  ? BUN 28 (H) 05/02/2021  ? CO2 33 (H) 05/02/2021  ? TSH 1.35 05/02/2021  ? INR 0.9 04/03/2019  ? HGBA1C 8.2 (H) 05/02/2021  ? MICROALBUR <0.7 07/31/2018  ? ? ?US RENAL ? ?Result Date: 04/04/2019 ?CLINICAL DATA:  Acute kidney injury. COVID-19 infection. Altered mental status. EXAM: RENAL / URINARY TRACT ULTRASOUND COMPLETE COMPARISON:  Renal ultrasound 06/19/2018 FINDINGS: Right Kidney: Renal measurements: 10.1 x 6.0 x 5.2 cm = volume: 164.5 mL. There is cortical thinning with increased echogenicity. A small cyst is present in the interpolar region, measuring 15 mm maximally. No hydronephrosis. Left Kidney: Renal measurements: 11.2 x 6.7 x 4.9 cm = volume: 194.7 mL. Mild cortical thinning. No hydronephrosis. Bladder: Appears normal for degree of bladder distention. Other: Examination is mildly limited by the patient's mental status and bowel gas. IMPRESSION: 1. Both kidneys are mildly enlarged compared with the prior ultrasound, suggesting acute tubular necrosis or interstitial nephritis. 2. No hydronephrosis or perinephric fluid collection. Electronically Signed   By: Richardean Sale M.D.   On: 04/04/2019 12:10  ? ?DG CHEST PORT 1 VIEW ? ?Result Date: 04/04/2019 ?CLINICAL DATA:  Shortness of breath. EXAM: PORTABLE CHEST 1 VIEW COMPARISON:  Twenty-fifth 2021. FINDINGS: Stable cardiomediastinal silhouette. Atherosclerosis of thoracic aorta is noted. No pneumothorax or pleural effusion is noted. No pneumothorax or pleural effusion is noted. IMPRESSION: No active disease. Aortic Atherosclerosis (ICD10-I70.0). Electronically Signed   By: Marijo Conception M.D.   On: 04/04/2019 08:48  ? ?DG  Chest Port 1 View ? ?Result Date: 04/03/2019 ?CLINICAL DATA:  Body aches, and sinus congestion and cough for 3 days. EXAM: PORTABLE CHEST 1 VIEW COMPARISON:  PA and lateral chest 02/25/2015. FINDINGS: The lung volumes are low. Lungs clear. No pneumothorax or pleural effusion. Heart size is normal. Atherosclerosis. No acute or focal bony abnormality. IMPRESSION: No acute disease. Atherosclerosis. Electronically Signed   By: Inge Rise M.D.   On: 04/03/2019 11:11  ? ? ?Assessment & Plan:  ? ?Madalynne was seen today for hypertension and diabetes. ? ?Diagnoses and all orders for this visit: ? ?Type 2 diabetes mellitus with diabetic nephropathy, without long-term  current use of insulin ([place])- Her blood sugar is well controlled. ? ?Bilateral hearing loss, unspecified hearing loss type ?-     Ambulatory referral to Audiology ? ?Stage 3b chronic kidney disease ([place])- Renal function is stable. ? ?Need for prophylactic vaccination and inoculation against varicella ?-     Zoster Vaccine Adjuvanted [facility]) injection; Inject 0.5 mLs into the muscle once for 1 dose. ? ?Need for prophylactic vaccination with combined diphtheria-tetanus-pertussis (DTP) vaccine ?-     Tdap (BOOSTRIX) 5-2.5-18.5 LF-MCG/0.5 injection; Inject 0.5 mLs into the muscle once for 1 dose. ? ? ?I am having [name] start on Shingrix and Boostrix. I am also having her maintain her aspirin, onetouch ultrasoft, Contour Next EZ, Clickfine Pen Needles, hydrochlorothiazide, tamsulosin, dapagliflozin propanediol, Toujeo Max SoloStar, metoprolol tartrate, clopidogrel, and atorvastatin. ? ?Meds ordered this encounter  ?Medications  ? Zoster Vaccine Adjuvanted [facility]) injection  ?  Sig: Inject 0.5 mLs into the muscle once for 1 dose.  ?  Dispense:  0.5 mL  ?  Refill:  1  ? Tdap (BOOSTRIX) 5-2.5-18.5 LF-MCG/0.5 injection  ?  Sig: Inject 0.5 mLs into the muscle once for 1 dose.  ?  Dispense:  0.5 mL  ?  Refill:  0  ? ? ? ?Follow-up: No follow-ups on  file. ? ?Scarlette Calico, MD ?

## 2021-06-16 ENCOUNTER — Ambulatory Visit: Payer: 59 | Attending: Audiology | Admitting: Audiology

## 2021-06-16 DIAGNOSIS — H903 Sensorineural hearing loss, bilateral: Secondary | ICD-10-CM | POA: Insufficient documentation

## 2021-06-17 NOTE — Procedures (Signed)
?  [facility] [place] ?[address] ?[place], [place]  [address] ?[phone] ? ?AUDIOLOGICAL  EVALUATION ? ?NAME: Michelle Lopez     ?DOB:   04-01-1945      ?MRN: [id number]                                                                                     ?DATE: 06/17/2021     ?REFERENT: Janith Lima, MD ?STATUS: Outpatient ?DIAGNOSIS: Sensorineural hearing loss  ? ?History: ?Punam was seen for an audiological evaluation due to decreased hearing occurring for 3+ months. Onie was accompanied to the appointment by her daughter and great-granddaughter. Zyria's medical history is significant for Type 2 diabetes mellitus with diabetic nephropathy and stage 3b chronic kidney disease. Bryttany reports a decrease in hearing occurring for a few months. She reports episodes of dizziness and vertigo. Samuella denies otalgia, tinnitus, and aural fullness.  ? ?Evaluation:  ?Otoscopy showed a clear view of the tympanic membranes, bilaterally ?Tympanometry results were consistent with normal middle ear pressure and normal tympanic membrane mobility (Type A), bilaterally.  ?Audiometric testing was completed using Conventional Audiometry techniques with insert earphones and TDH headphones. Test results are consistent with a mild to moderately severe sensorineural hearing loss in the left ear and a moderate to severe mixed hearing loss in the right ear. Asymmetry noted at [phone] Hz, worse in the right ear. Speech Recognition Thresholds were obtained at 70 dB HL in the right ear and at 40  dB HL in the left ear. Word Recognition Testing was completed at 95 dB HL and Kyra scored 76% in the right ear. Testing was attempted at 100 dB HL however Oluwatobi reported the sound level was intolerable. Word Recognition Testing was completed at 80 dB HL and Angelita scored 92% in the left ear.   ? ?Results:  ?Today's test results are consistent with a mild to moderately severe sensorineural hearing loss in the left ear  and a moderate to severe mixed hearing loss in the right ear. Asymmetry noted at [phone] Hz, worse in the right ear. Asymmetric word recognition noted, worse in the right ear. Marionna will have communication difficulty in all listening environments. She will benefit from the use of amplification and good communication strategies. A referral to an ENT was reviewed due to asymmetry. The test results and recommendations were reviewed with Amariya and her daughter.  ? ?Recommendations: ?Referral to an ENT due to asymmetric hearing loss.  ?Use of Amplification. Lyne was given a handout with [facility] in the [place] area. Santoria was given a handout for [employer] [facility] ? ? ? 40 minutes spent testing and counseling on results.  ? ?If you have any questions please feel free to contact me at [phone]. ? ?Bari Mantis ?Audiologist, Au.D., CCC-A ?06/17/2021  8:17 AM ? ?Cc: Janith Lima, MD ? ?

## 2021-06-24 ENCOUNTER — Telehealth: Payer: Self-pay

## 2021-06-24 NOTE — Telephone Encounter (Signed)
Volunteer check in call for palliative care, no answer 

## 2021-07-13 ENCOUNTER — Other Ambulatory Visit: Payer: Self-pay | Admitting: Internal Medicine

## 2021-07-13 DIAGNOSIS — I10 Essential (primary) hypertension: Secondary | ICD-10-CM

## 2021-07-20 ENCOUNTER — Other Ambulatory Visit: Payer: Self-pay | Admitting: Internal Medicine

## 2021-07-20 DIAGNOSIS — E1121 Type 2 diabetes mellitus with diabetic nephropathy: Secondary | ICD-10-CM

## 2021-08-22 ENCOUNTER — Ambulatory Visit: Payer: 59 | Admitting: Family Medicine

## 2021-08-22 ENCOUNTER — Other Ambulatory Visit: Payer: Self-pay | Admitting: Podiatry

## 2021-08-22 ENCOUNTER — Ambulatory Visit (INDEPENDENT_AMBULATORY_CARE_PROVIDER_SITE_OTHER): Payer: 59 | Admitting: Podiatry

## 2021-08-22 DIAGNOSIS — W57XXXA Bitten or stung by nonvenomous insect and other nonvenomous arthropods, initial encounter: Secondary | ICD-10-CM | POA: Diagnosis not present

## 2021-08-22 DIAGNOSIS — M79675 Pain in left toe(s): Secondary | ICD-10-CM | POA: Diagnosis not present

## 2021-08-22 DIAGNOSIS — S90861A Insect bite (nonvenomous), right foot, initial encounter: Secondary | ICD-10-CM

## 2021-08-22 DIAGNOSIS — S90821A Blister (nonthermal), right foot, initial encounter: Secondary | ICD-10-CM | POA: Diagnosis not present

## 2021-08-22 DIAGNOSIS — B351 Tinea unguium: Secondary | ICD-10-CM

## 2021-08-22 DIAGNOSIS — M79674 Pain in right toe(s): Secondary | ICD-10-CM | POA: Diagnosis not present

## 2021-08-22 DIAGNOSIS — E1151 Type 2 diabetes mellitus with diabetic peripheral angiopathy without gangrene: Secondary | ICD-10-CM

## 2021-08-22 DIAGNOSIS — L089 Local infection of the skin and subcutaneous tissue, unspecified: Secondary | ICD-10-CM

## 2021-08-22 MED ORDER — SILVER SULFADIAZINE 1 % EX CREA: TOPICAL_CREAM | CUTANEOUS | 0 refills | Status: DC

## 2021-08-22 MED ORDER — DOXYCYCLINE HYCLATE 100 MG PO CAPS: ORAL_CAPSULE | ORAL | 0 refills | Status: DC

## 2021-08-22 NOTE — Patient Instructions (Signed)
DRESSING CHANGES RIGHT FOOT:   PHARMACY SHOPPING LIST: Saline or Wound Cleanser for cleaning wound 2 x 2 inch sterile gauze for cleaning wound SILVADENE CREAM  A. IF DISPENSED, WEAR SURGICAL SHOE OR WALKING BOOT AT ALL TIMES.  B. IF PRESCRIBED ORAL ANTIBIOTICS, TAKE ALL MEDICATION AS PRESCRIBED UNTIL ALL ARE GONE.  1. KEEP RIGHT FOOT DRY AT ALL TIMES!!!!  2. CLEANSE RIGHT FOOT WITH SALINE OR WOUND CLEANSER.  3. DAB DRY WITH GAUZE SPONGE.  4. APPLY A LIGHT AMOUNT OF SILVADENE CREAM TO BLISTER.  5. APPLY OUTER DRESSING AS INSTRUCTED.  6. WEAR SURGICAL SHOE/BOOT DAILY AT ALL TIMES.   7. DO NOT WALK BAREFOOT!!!  8.  IF YOU EXPERIENCE ANY FEVER, CHILLS, NIGHTSWEATS, NAUSEA OR VOMITING, ELEVATED OR LOW BLOOD SUGARS, REPORT TO EMERGENCY ROOM.  9. IF YOU EXPERIENCE INCREASED REDNESS, PAIN, SWELLING, DISCOLORATION, ODOR, PUS, DRAINAGE OR WARMTH OF YOUR FOOT, REPORT TO EMERGENCY ROOM.

## 2021-08-22 NOTE — Progress Notes (Signed)
Subjective:  Patient ID: Michelle Lopez, female    DOB: [birth year],  MRN: [id number]  Michelle Lopez presents to clinic today for for at risk foot care. Patient has h/o PAD and painful elongated mycotic toenails 1-5 bilaterally which are tender when wearing enclosed shoe gear. Pain is relieved with periodic professional debridement.  Last known HgA1c was unknown.  Patient did not check blood glucose today.  Her daughter is present during today's visit.  New problem(s): Patient presents with chief concern of injury to right lower extremity. Injury occurred two days ago. Injury recurred as a result of a suspected insect bite resulting in blisters on top of right foot. . Patient states aggravating factor(s) is/are direct pressure and walking .  Patient has tried no attempts at treatment. Patient denies any fever, night sweats, nausea or vomiting. States she always has chills even before this incident.         PCP is Janith Lima, MD , and last visit was  Jun 13, 2021  Allergies  Allergen Reactions   Amlodipine Other (See Comments)    Ankle edema   Crestor [Rosuvastatin Calcium]     myalgias   Morphine Hives   Pravastatin Sodium Rash   Simvastatin Rash    Review of Systems: Negative except as noted in the HPI.  Objective: There were no vitals filed for this visit.  Michelle Lopez is a pleasant 76 y.o. female in NAD. AAO X 3.  Vascular Examination: CFT <3 seconds b/l LE. Diminished pedal pulses b/l LE. Pedal hair absent. No pain with calf compression b/l. Lower extremity skin temperature gradient within normal limits. Unilateral edema left LE. No cyanosis or clubbing noted b/l LE.  Dermatological Examination: Pedal integument with normal turgor, texture and tone b/l LE. No interdigital macerations b/l. Toenails 1-5 b/l elongated, thickened, discolored with subungual debris. +Tenderness with dorsal palpation of nailplates. No hyperkeratotic or porokeratotic lesions present.  Tense  blister noted dorsal aspect of right midfoot with smaller blister more proximal to it. Mild erythema surrounding blister. There is pain on palpation of area surrounding the blister. Trace edema. No abnormal coolness. Clear fluid expressed from both blisters. No odor, no tracking into deep tissues.  Musculoskeletal Examination: Normal muscle strength 5/5 to all lower extremity muscle groups bilaterally. Hammertoe(s) noted to the bilateral 2nd toes and bilateral 3rd toes.. No pain, crepitus or joint limitation noted with ROM b/l LE.  Patient ambulates independently without assistive aids.  Neurological Examination: Protective sensation intact 5/5 intact bilaterally with 10g monofilament b/l. Proprioception intact bilaterally.     Latest Ref Rng & Units 05/02/2021    4:00 PM  Hemoglobin A1C  Hemoglobin-A1c 4.6 - 6.5 % 8.2    Assessment/Plan: 1. Pain due to onychomycosis of toenails of both feet   2. Insect bite of right foot, initial encounter   3. Infected blister of right foot, initial encounter   4. Type II diabetes mellitus with peripheral circulatory disorder [facility])     -Patient was evaluated and treated. All patient's and/or POA's questions/concerns answered on today's visit. -Examined patient. -Ordered noninvasive arterial studies ABIs with and without TBIs for b/l lower extremities. -Dispensed Darco shoe for right lower extremity.Patient/POA signed Medicare and Medicaid ABNs for DME. -Betadine prep applied to blisters. Both lanced with sterile 15 blade and blister roof left intact. Area cleansed with hydrogen peroxide. Silvadene Cream and light dressing applied. Dispensed printed once daily wound care instructions to daughter. -Wound culture taken right foot. -Toenails 1-5 b/l were debrided  in length and girth with sterile nail nippers and dremel without iatrogenic bleeding.  -Rx for Doxycyline 100 mg, #20, to be taken one capsule twice daily for 10 days. -Patient scheduled to see Dr.  [name] in one week for follow up of infected blister/insect bite right foot. -Patient/POA to call should there be question/concern in the interim.   Return in about 1 week (around 08/29/2021).  Michelle Lopez, DPM

## 2021-08-23 ENCOUNTER — Encounter: Payer: Self-pay | Admitting: Podiatry

## 2021-08-24 ENCOUNTER — Other Ambulatory Visit: Payer: Self-pay | Admitting: Internal Medicine

## 2021-08-24 NOTE — Telephone Encounter (Signed)
Ok to PCP please 

## 2021-08-26 LAB — WOUND CULTURE
MICRO NUMBER:: 13655765
RESULT:: NO GROWTH
SPECIMEN QUALITY:: ADEQUATE

## 2021-08-26 LAB — HOUSE ACCOUNT TRACKING

## 2021-08-27 ENCOUNTER — Ambulatory Visit: Payer: 59 | Admitting: Podiatry

## 2021-09-01 ENCOUNTER — Ambulatory Visit: Payer: 59 | Admitting: Podiatry

## 2021-09-06 ENCOUNTER — Telehealth: Payer: Self-pay

## 2021-09-06 ENCOUNTER — Ambulatory Visit: Payer: 59 | Admitting: Podiatry

## 2021-09-06 NOTE — Telephone Encounter (Signed)
PC SW placed TC to patients daughter to complete telephonic check in on patient and discuss adult day programs.  Call unsuccessful. SW LVM with contact information.

## 2021-09-07 ENCOUNTER — Ambulatory Visit: Payer: 59

## 2021-09-13 ENCOUNTER — Other Ambulatory Visit: Payer: Self-pay | Admitting: Internal Medicine

## 2021-09-13 DIAGNOSIS — E1121 Type 2 diabetes mellitus with diabetic nephropathy: Secondary | ICD-10-CM

## 2021-09-13 DIAGNOSIS — I739 Peripheral vascular disease, unspecified: Secondary | ICD-10-CM

## 2021-09-13 DIAGNOSIS — E785 Hyperlipidemia, unspecified: Secondary | ICD-10-CM

## 2021-09-13 DIAGNOSIS — I10 Essential (primary) hypertension: Secondary | ICD-10-CM

## 2021-09-26 ENCOUNTER — Other Ambulatory Visit: Payer: Self-pay | Admitting: Internal Medicine

## 2021-09-26 DIAGNOSIS — E1121 Type 2 diabetes mellitus with diabetic nephropathy: Secondary | ICD-10-CM

## 2021-10-12 ENCOUNTER — Other Ambulatory Visit: Payer: Self-pay | Admitting: Internal Medicine

## 2021-10-12 DIAGNOSIS — I10 Essential (primary) hypertension: Secondary | ICD-10-CM

## 2021-10-20 ENCOUNTER — Telehealth: Payer: Self-pay | Admitting: Internal Medicine

## 2021-10-20 NOTE — Telephone Encounter (Signed)
LVM for pt to rtn my call to schedule AWV with [facility] call back # [phone] 

## 2021-11-08 ENCOUNTER — Ambulatory Visit (INDEPENDENT_AMBULATORY_CARE_PROVIDER_SITE_OTHER): Payer: 59

## 2021-11-08 ENCOUNTER — Other Ambulatory Visit: Payer: Self-pay | Admitting: Internal Medicine

## 2021-11-08 ENCOUNTER — Telehealth: Payer: Self-pay

## 2021-11-08 VITALS — BP 122/70 | HR 75 | Temp 97.8°F | Ht 60.0 in | Wt 142.2 lb

## 2021-11-08 DIAGNOSIS — Z1239 Encounter for other screening for malignant neoplasm of breast: Secondary | ICD-10-CM | POA: Diagnosis not present

## 2021-11-08 DIAGNOSIS — Z0000 Encounter for general adult medical examination without abnormal findings: Secondary | ICD-10-CM | POA: Diagnosis not present

## 2021-11-08 DIAGNOSIS — E1121 Type 2 diabetes mellitus with diabetic nephropathy: Secondary | ICD-10-CM

## 2021-11-08 DIAGNOSIS — Z13820 Encounter for screening for osteoporosis: Secondary | ICD-10-CM

## 2021-11-08 DIAGNOSIS — Z23 Encounter for immunization: Secondary | ICD-10-CM

## 2021-11-08 DIAGNOSIS — N1832 Chronic kidney disease, stage 3b: Secondary | ICD-10-CM

## 2021-11-08 MED ORDER — ONETOUCH ULTRASOFT LANCETS MISC: 1 refills | Status: DC

## 2021-11-08 MED ORDER — DAPAGLIFLOZIN PROPANEDIOL 10 MG PO TABS: 10.0000 mg | ORAL_TABLET | Freq: Every day | ORAL | 1 refills | Status: DC

## 2021-11-08 MED ORDER — CONTOUR NEXT EZ W/DEVICE KIT: 1.0000 | PACK | Freq: Two times a day (BID) | 3 refills | Status: DC

## 2021-11-08 NOTE — Patient Instructions (Addendum)
Ms. Michelle Lopez , Thank you for taking time to come for your Medicare Wellness Visit. I appreciate your ongoing commitment to your health goals. Please review the following plan we discussed and let me know if I can assist you in the future.   These are the goals we discussed:  Goals       DIABETES MANAGEMENT (pt-stated)      STAY HYDRATED AND NO CARBS/SUGAR DIET.        This is a list of the screening recommended for you and due dates:  Health Maintenance  Topic Date Due   Zoster (Shingles) Vaccine (1 of 2) Never done   Yearly kidney health urinalysis for diabetes  07/31/2019   Tetanus Vaccine  11/17/2019   COVID-19 Vaccine (4 - Pfizer series) 08/09/2020   Flu Shot  09/06/2021   Hemoglobin A1C  11/02/2021   Complete foot exam   03/11/2022   Yearly kidney function blood test for diabetes  05/03/2022   Pneumonia Vaccine  Completed   DEXA scan (bone density measurement)  Completed   Hepatitis C Screening: USPSTF Recommendation to screen - Ages 68-79 yo.  Completed   HPV Vaccine  Aged Out    Advanced directives:YES1  Conditions/risks identified: YES; TYPE II DIABETES MELLITUS  Next appointment: Follow up in one year for your annual wellness visit.   Preventive Care 79 Years and Older, Female Preventive care refers to lifestyle choices and visits with your health care provider that can promote health and wellness. What does preventive care include? A yearly physical exam. This is also called an annual well check. Dental exams once or twice a year. Routine eye exams. Ask your health care provider how often you should have your eyes checked. Personal lifestyle choices, including: Daily care of your teeth and gums. Regular physical activity. Eating a healthy diet. Avoiding tobacco and drug use. Limiting alcohol use. Practicing safe sex. Taking low-dose aspirin every day. Taking vitamin and mineral supplements as recommended by your health care provider. What happens during an  annual well check? The services and screenings done by your health care provider during your annual well check will depend on your age, overall health, lifestyle risk factors, and family history of disease. Counseling  Your health care provider may ask you questions about your: Alcohol use. Tobacco use. Drug use. Emotional well-being. Home and relationship well-being. Sexual activity. Eating habits. History of falls. Memory and ability to understand (cognition). Work and work Statistician. Reproductive health. Screening  You may have the following tests or measurements: Height, weight, and BMI. Blood pressure. Lipid and cholesterol levels. These may be checked every 5 years, or more frequently if you are over 78 years old. Skin check. Lung cancer screening. You may have this screening every year starting at age 27 if you have a 30-pack-year history of smoking and currently smoke or have quit within the past 15 years. Fecal occult blood test (FOBT) of the stool. You may have this test every year starting at age 14. Flexible sigmoidoscopy or colonoscopy. You may have a sigmoidoscopy every 5 years or a colonoscopy every 10 years starting at age 55. Hepatitis C blood test. Hepatitis B blood test. Sexually transmitted disease (STD) testing. Diabetes screening. This is done by checking your blood sugar (glucose) after you have not eaten for a while (fasting). You may have this done every 1-3 years. Bone density scan. This is done to screen for osteoporosis. You may have this done starting at age 63. Mammogram. This may  be done every 1-2 years. Talk to your health care provider about how often you should have regular mammograms. Talk with your health care provider about your test results, treatment options, and if necessary, the need for more tests. Vaccines  Your health care provider may recommend certain vaccines, such as: Influenza vaccine. This is recommended every year. Tetanus,  diphtheria, and acellular pertussis (Tdap, Td) vaccine. You may need a Td booster every 10 years. Zoster vaccine. You may need this after age 2. Pneumococcal 13-valent conjugate (PCV13) vaccine. One dose is recommended after age 96. Pneumococcal polysaccharide (PPSV23) vaccine. One dose is recommended after age 53. Talk to your health care provider about which screenings and vaccines you need and how often you need them. This information is not intended to replace advice given to you by your health care provider. Make sure you discuss any questions you have with your health care provider. Document Released: 02/19/2015 Document Revised: 10/13/2015 Document Reviewed: 11/24/2014 Elsevier Interactive Patient Education  2017 [place] Prevention in the Home Falls can cause injuries. They can happen to people of all ages. There are many things you can do to make your home safe and to help prevent falls. What can I do on the outside of my home? Regularly fix the edges of walkways and driveways and fix any cracks. Remove anything that might make you trip as you walk through a door, such as a raised step or threshold. Trim any bushes or trees on the path to your home. Use bright outdoor lighting. Clear any walking paths of anything that might make someone trip, such as rocks or tools. Regularly check to see if handrails are loose or broken. Make sure that both sides of any steps have handrails. Any raised decks and porches should have guardrails on the edges. Have any leaves, snow, or ice cleared regularly. Use sand or salt on walking paths during winter. Clean up any spills in your garage right away. This includes oil or grease spills. What can I do in the bathroom? Use night lights. Install grab bars by the toilet and in the tub and shower. Do not use towel bars as grab bars. Use non-skid mats or decals in the tub or shower. If you need to sit down in the shower, use a plastic,  non-slip stool. Keep the floor dry. Clean up any water that spills on the floor as soon as it happens. Remove soap buildup in the tub or shower regularly. Attach bath mats securely with double-sided non-slip rug tape. Do not have throw rugs and other things on the floor that can make you trip. What can I do in the bedroom? Use night lights. Make sure that you have a light by your bed that is easy to reach. Do not use any sheets or blankets that are too big for your bed. They should not hang down onto the floor. Have a firm chair that has side arms. You can use this for support while you get dressed. Do not have throw rugs and other things on the floor that can make you trip. What can I do in the kitchen? Clean up any spills right away. Avoid walking on wet floors. Keep items that you use a lot in easy-to-reach places. If you need to reach something above you, use a strong step stool that has a grab bar. Keep electrical cords out of the way. Do not use floor polish or wax that makes floors slippery. If you must use  wax, use non-skid floor wax. Do not have throw rugs and other things on the floor that can make you trip. What can I do with my stairs? Do not leave any items on the stairs. Make sure that there are handrails on both sides of the stairs and use them. Fix handrails that are broken or loose. Make sure that handrails are as long as the stairways. Check any carpeting to make sure that it is firmly attached to the stairs. Fix any carpet that is loose or worn. Avoid having throw rugs at the top or bottom of the stairs. If you do have throw rugs, attach them to the floor with carpet tape. Make sure that you have a light switch at the top of the stairs and the bottom of the stairs. If you do not have them, ask someone to add them for you. What else can I do to help prevent falls? Wear shoes that: Do not have high heels. Have rubber bottoms. Are comfortable and fit you well. Are closed  at the toe. Do not wear sandals. If you use a stepladder: Make sure that it is fully opened. Do not climb a closed stepladder. Make sure that both sides of the stepladder are locked into place. Ask someone to hold it for you, if possible. Clearly mark and make sure that you can see: Any grab bars or handrails. First and last steps. Where the edge of each step is. Use tools that help you move around (mobility aids) if they are needed. These include: Canes. Walkers. Scooters. Crutches. Turn on the lights when you go into a dark area. Replace any light bulbs as soon as they burn out. Set up your furniture so you have a clear path. Avoid moving your furniture around. If any of your floors are uneven, fix them. If there are any pets around you, be aware of where they are. Review your medicines with your doctor. Some medicines can make you feel dizzy. This can increase your chance of falling. Ask your doctor what other things that you can do to help prevent falls. This information is not intended to replace advice given to you by your health care provider. Make sure you discuss any questions you have with your health care provider. Document Released: 11/19/2008 Document Revised: 07/01/2015 Document Reviewed: 02/27/2014 Elsevier Interactive Patient Education  2017 [employer].

## 2021-11-08 NOTE — Progress Notes (Signed)
Subjective:   Michelle Lopez is a 76 y.o. female who presents for Medicare Annual (Subsequent) preventive examination.  Review of Systems     Cardiac Risk Factors include: advanced age (>42mn, >>64women);diabetes mellitus;hypertension;dyslipidemia;sedentary lifestyle     Objective:    Today's Vitals   11/08/21 1516  BP: 122/70  Pulse: 75  Temp: 97.8 F (36.6 C)  SpO2: 97%  Weight: 142 lb 3.2 oz (64.5 kg)  Height: 5' (1.524 m)  PainSc: 0-No pain   Body mass index is 27.77 kg/m.     11/08/2021    3:38 PM 03/19/2020    3:48 PM 04/03/2019    3:00 PM 12/30/2018    2:08 PM 08/27/2018    9:02 AM 03/18/2018    3:12 PM 08/22/2016   11:48 AM  Advanced Directives  Does Patient Have a Medical Advance Directive? Yes Yes Yes Yes Yes No Yes  Type of [place]of [place] Living will;Healthcare Power of [place]  [place] Does patient want to make changes to medical advance directive?  No - Patient declined No - Patient declined No - Guardian declined   No - Patient declined  Copy of HMissionin Chart? No - copy requested No - copy requested No - copy requested No - copy requested No - copy requested  Yes  Would patient like information on creating a medical advance directive?      No - Patient declined     Current Medications (verified) Outpatient Encounter Medications as of 11/08/2021  Medication Sig   aspirin 81 MG tablet Take 81 mg by mouth daily.   atorvastatin (LIPITOR) 20 MG tablet TAKE 1 TABLET BY MOUTH EVERY DAY   Blood Glucose Monitoring Suppl (CONTOUR NEXT EZ) w/Device KIT 1 Act by Does not apply route 2 (two) times daily.   CLICKFINE PEN NEEDLES 31G X 6 MM MISC USE TO INJECT insulin UP TO 5 TIMES DAILY   clopidogrel (PLAVIX) 75 MG tablet TAKE 1 TABLET BY MOUTH EVERY DAY   hydrochlorothiazide  (MICROZIDE) 12.5 MG capsule TAKE 1 CAPSULE BY MOUTH EVERY DAY   Lancets (ONETOUCH ULTRASOFT) lancets Use to check blood sugars twice a day   metoprolol tartrate (LOPRESSOR) 25 MG tablet TAKE 1/2 TABLET BY MOUTH 2 TIMES DAILY   tamsulosin (FLOMAX) 0.4 MG CAPS capsule TAKE 1 CAPSULE BY MOUTH EVERY MORNING   TOUJEO MAX SOLOSTAR 300 UNIT/ML Solostar Pen inject 20 UNITS into THE SKIN daily   dapagliflozin propanediol (FARXIGA) 10 MG TABS tablet Take 1 tablet (10 mg total) by mouth daily before breakfast. (Patient not taking: Reported on 11/08/2021)   doxycycline (VIBRAMYCIN) 100 MG capsule Take one capsule by mouth twice daily. (Patient not taking: Reported on 11/08/2021)   silver sulfADIAZINE (SILVADENE) 1 % cream Apply to right foot once daily until healed. (Patient not taking: Reported on 11/08/2021)   No facility-administered encounter medications on file as of 11/08/2021.    Allergies (verified) Amlodipine, Crestor [rosuvastatin calcium], Morphine, Pravastatin sodium, and Simvastatin   History: Past Medical History:  Diagnosis Date   Cerebrovascular disease, unspecified    Cocaine abuse, unspecified    Diabetes mellitus    type 2, uncontrolled   Diarrhea    Essential hypertension, benign    Hyperlipidemia    Stroke ([place]    x7   Past Surgical History:  Procedure Laterality Date   TOTAL  HIP ARTHROPLASTY  1970   Dr. Rodell Perna   Family History  Problem Relation Age of Onset   Colon cancer Neg Hx    Social History   Socioeconomic History   Marital status: Divorced    Spouse name: Not on file   Number of children: 4   Years of education: Not on file   Highest education level: Not on file  Occupational History   Occupation: Disability  Tobacco Use   Smoking status: Never   Smokeless tobacco: Never  Vaping Use   Vaping Use: Never used  Substance and Sexual Activity   Alcohol use: No    Alcohol/week: 0.0 standard drinks of alcohol   Drug use: No   Sexual activity: Not  Currently  Other Topics Concern   Not on file  Social History Narrative   Daily caffeine    Social Determinants of Health   Financial Resource Strain: Low Risk  (11/08/2021)   Overall Financial Resource Strain (CARDIA)    Difficulty of Paying Living Expenses: Not hard at all  Food Insecurity: No Food Insecurity (11/08/2021)   Hunger Vital Sign    Worried About Running Out of Food in the Last Year: Never true    [place] in the Last Year: Never true  Transportation Needs: No Transportation Needs (11/08/2021)   PRAPARE - Hydrologist (Medical): No    Lack of Transportation (Non-Medical): No  Physical Activity: Inactive (11/08/2021)   Exercise Vital Sign    Days of Exercise per Week: 0 days    Minutes of Exercise per Session: 0 min  Stress: No Stress Concern Present (11/08/2021)   [place]    Feeling of Stress : Not at all  Social Connections: Moderately Isolated (11/08/2021)   Social Connection and Isolation Panel [NHANES]    Frequency of Communication with Friends and Family: More than three times a week    Frequency of Social Gatherings with Friends and Family: More than three times a week    Attends Religious Services: Never    Marine scientist or Organizations: Yes    Attends Archivist Meetings: Never    Marital Status: Never married    Tobacco Counseling Counseling given: Not Answered   Clinical Intake:  Pre-visit preparation completed: Yes  Pain : No/denies pain Pain Score: 0-No pain     Nutritional Risks: None Diabetes: No  How often do you need to have someone help you when you read instructions, pamphlets, or other written materials from your doctor or pharmacy?: 1 - Never What is the last grade level you completed in school?: HSG  Nutrition Risk Assessment:  Has the patient had any N/V/D within the last 2 months?  No  Does the patient have  any non-healing wounds?  No  Has the patient had any unintentional weight loss or weight gain?  No   Diabetes:  Is the patient diabetic?  Yes  If diabetic, was a CBG obtained today?  No  Did the patient bring in their glucometer from home?  Yes  How often do you monitor your CBG's? DAILY.   Financial Strains and Diabetes Management:  Are you having any financial strains with the device, your supplies or your medication? No .  Does the patient want to be seen by Chronic Care Management for management of their diabetes?  No  Would the patient like to be referred to a Nutritionist  or for Diabetic Management?  No   Diabetic Exams:  Diabetic Eye Exam: Completed 08/2021 Diabetic Foot Exam: Completed 08/22/2021   Interpreter Needed?: No  Information entered by :: Lisette Abu, LPN.   Activities of Daily Living    11/08/2021    3:18 PM  In your present state of health, do you have any difficulty performing the following activities:  Hearing? 1  Vision? 0  Difficulty concentrating or making decisions? 1  Walking or climbing stairs? 1  Comment cane, wallker  Dressing or bathing? 0  Doing errands, shopping? 1  Comment does not drive  Preparing Food and eating ? N  Using the Toilet? N  In the past six months, have you accidently leaked urine? N  Do you have problems with loss of bowel control? N  Managing your Medications? Y  Comment daughter  Managing your Finances? Y  Comment daughter  Housekeeping or managing your Housekeeping? N    Patient Care Team: Janith Lima, MD as PCP - Lillia Pauls, MD as Consulting Physician (Ophthalmology)  Indicate any recent Medical Services you may have received from other than [facility] providers in the past year (date may be approximate).     Assessment:   This is a routine wellness examination for Michelle Lopez.  Hearing/Vision screen Hearing Screening - Comments:: Issues with hearing.  Has rx for hearing aids. Vision Screening  - Comments:: Patient wears eyeglasses.  Eye exam done by: Monna Fam, MD.  Dietary issues and exercise activities discussed: Current Exercise Habits: The patient does not participate in regular exercise at present   Goals Addressed               This Visit's Progress     DIABETES MANAGEMENT (pt-stated)        STAY HYDRATED AND NO CARBS/SUGAR DIET.      Depression Screen    11/08/2021    3:17 PM 06/13/2021    3:00 PM 06/02/2020    9:17 AM 03/19/2020    3:46 PM 12/30/2018    2:05 PM 08/27/2018    9:54 AM 08/01/2018   12:20 PM  PHQ 2/9 Scores  PHQ - 2 Score 0 0 0 0 0 0   Exception Documentation       Medical reason    Fall Risk    11/08/2021    3:18 PM 06/13/2021    3:00 PM 06/02/2020    9:17 AM 03/19/2020    3:48 PM 02/17/2019   11:16 AM  Fall Risk   Falls in the past year? 0 0 0 0 0  Number falls in past yr: 0  0 0   Injury with Fall? 0  0 0   Risk for fall due to : No Fall Risks  No Fall Risks Impaired balance/gait   Follow up Falls prevention discussed        FALL RISK PREVENTION PERTAINING TO THE HOME:  Any stairs in or around the home? Yes  If so, are there any without handrails? No  Home free of loose throw rugs in walkways, pet beds, electrical cords, etc? Yes  Adequate lighting in your home to reduce risk of falls? Yes   ASSISTIVE DEVICES UTILIZED TO PREVENT FALLS:  Life alert? No  Use of a cane, walker or w/c? Yes  Grab bars in the bathroom? No  Shower chair or bench in shower? Yes  Elevated toilet seat or a handicapped toilet? Yes   TIMED UP AND GO:  Was the test  performed? Yes .  Length of time to ambulate 10 feet: 15 sec.   Gait slow and steady with assistive deviceGait slow and steady with assistive device  Cognitive Function:        11/08/2021    3:19 PM  6CIT Screen  What Year? 0 points  What month? 0 points  What time? 0 points  Count back from 20 0 points  Months in reverse 4 points  Repeat phrase 0 points  Total Score 4 points     Immunizations Immunization History  Administered Date(s) Administered   Fluad Quad(high Dose 65+) 01/07/2019   Influenza Whole 01/14/2009, 11/16/2009   Influenza, High Dose Seasonal PF 12/14/2014, 11/27/2016, 10/17/2017   Influenza,inj,Quad PF,6+ Mos 10/31/2013   Influenza-Unspecified 12/07/2012   PFIZER(Purple Top)SARS-COV-2 Vaccination 09/04/2019, 10/07/2019, 06/14/2020   Pneumococcal Conjugate-13 06/23/2014   Pneumococcal Polysaccharide-23 12/24/2008, 08/23/2017   Td 11/16/2009   Zoster, Live 10/03/2012    TDAP status: Due, Education has been provided regarding the importance of this vaccine. Advised may receive this vaccine at local pharmacy or Health Dept. Aware to provide a copy of the vaccination record if obtained from local pharmacy or Health Dept. Verbalized acceptance and understanding.  Flu Vaccine status: Completed at today's visit  Pneumococcal vaccine status: Up to date  Covid-19 vaccine status: Completed vaccines  Qualifies for Shingles Vaccine? Yes   Zostavax completed Yes   Shingrix Completed?: No.    Education has been provided regarding the importance of this vaccine. Patient has been advised to call insurance company to determine out of pocket expense if they have not yet received this vaccine. Advised may also receive vaccine at local pharmacy or Health Dept. Verbalized acceptance and understanding.  Screening Tests Health Maintenance  Topic Date Due   Zoster Vaccines- Shingrix (1 of 2) Never done   Diabetic kidney evaluation - Urine ACR  07/31/2019   TETANUS/TDAP  11/17/2019   COVID-19 Vaccine (4 - Pfizer series) 08/09/2020   INFLUENZA VACCINE  09/06/2021   HEMOGLOBIN A1C  11/02/2021   FOOT EXAM  03/11/2022   Diabetic kidney evaluation - GFR measurement  05/03/2022   Pneumonia Vaccine 57+ Years old  Completed   DEXA SCAN  Completed   Hepatitis C Screening  Completed   HPV VACCINES  Aged Out    Health Maintenance  Health Maintenance Due   Topic Date Due   Zoster Vaccines- Shingrix (1 of 2) Never done   Diabetic kidney evaluation - Urine ACR  07/31/2019   TETANUS/TDAP  11/17/2019   COVID-19 Vaccine (4 - Pfizer series) 08/09/2020   INFLUENZA VACCINE  09/06/2021   HEMOGLOBIN A1C  11/02/2021    Colorectal cancer screening: No longer required.   Mammogram status: Ordered 11/08/2021. Pt provided with contact info and advised to call to schedule appt.   Bone Density status: Ordered 11/08/2021. Pt provided with contact info and advised to call to schedule appt.  Lung Cancer Screening: (Low Dose CT Chest recommended if Age 67-80 years, 30 pack-year currently smoking OR have quit w/in 15years.) does not qualify.   Lung Cancer Screening Referral: NO  Additional Screening:  Hepatitis C Screening: does qualify; Completed 02/14/2018  Vision Screening: Recommended annual ophthalmology exams for early detection of glaucoma and other disorders of the eye. Is the patient up to date with their annual eye exam?  Yes  Who is the provider or what is the name of the office in which the patient attends annual eye exams? Monna Fam, MD. If pt is not  established with a provider, would they like to be referred to a provider to establish care? No .   Dental Screening: Recommended annual dental exams for proper oral hygiene  Community Resource Referral / Chronic Care Management: CRR required this visit?  No   CCM required this visit?  No      Plan:     I have personally reviewed and noted the following in the patient's chart:   Medical and social history Use of alcohol, tobacco or illicit drugs  Current medications and supplements including opioid prescriptions. Patient is not currently taking opioid prescriptions. Functional ability and status Nutritional status Physical activity Advanced directives List of other physicians Hospitalizations, surgeries, and ER visits in previous 12 months Vitals Screenings to include  cognitive, depression, and falls Referrals and appointments  In addition, I have reviewed and discussed with patient certain preventive protocols, quality metrics, and best practice recommendations. A written personalized care plan for preventive services as well as general preventive health recommendations were provided to patient.     Sheral Flow, LPN   [id number]   Nurse Notes: N/A

## 2021-11-08 NOTE — Telephone Encounter (Signed)
Patient has not been taking [place] since she recently moved in with daughter about 3 months.  Daughter wants to know can a new rx be sent in to pharmacy.  I explained to patient that Wilder Glade is to help the kidneys in diabetic patients.  Also she needs a new rx for glucometer.  She tests once a day in the morning.  Morning BS is running from 220 (12/03/2021), 229 (12/04/2021), 114 (today).  Pharmacy is [facility].

## 2021-11-19 ENCOUNTER — Other Ambulatory Visit: Payer: Self-pay | Admitting: Internal Medicine

## 2021-11-28 ENCOUNTER — Ambulatory Visit: Payer: 59 | Admitting: Podiatry

## 2021-11-28 ENCOUNTER — Other Ambulatory Visit: Payer: Self-pay | Admitting: Internal Medicine

## 2021-11-28 DIAGNOSIS — I10 Essential (primary) hypertension: Secondary | ICD-10-CM

## 2021-11-28 DIAGNOSIS — I739 Peripheral vascular disease, unspecified: Secondary | ICD-10-CM

## 2021-11-28 DIAGNOSIS — E1121 Type 2 diabetes mellitus with diabetic nephropathy: Secondary | ICD-10-CM

## 2021-11-28 DIAGNOSIS — E785 Hyperlipidemia, unspecified: Secondary | ICD-10-CM

## 2021-12-05 ENCOUNTER — Ambulatory Visit: Payer: 59 | Admitting: Podiatry

## 2021-12-06 ENCOUNTER — Ambulatory Visit
Admission: RE | Admit: 2021-12-06 | Discharge: 2021-12-06 | Disposition: A | Discharge status: Home / Self Care | Payer: Medicaid Other | Source: Ambulatory Visit | Attending: Internal Medicine | Admitting: Internal Medicine

## 2021-12-06 DIAGNOSIS — Z1239 Encounter for other screening for malignant neoplasm of breast: Secondary | ICD-10-CM

## 2021-12-09 ENCOUNTER — Other Ambulatory Visit: Payer: Self-pay | Admitting: Internal Medicine

## 2021-12-09 DIAGNOSIS — R928 Other abnormal and inconclusive findings on diagnostic imaging of breast: Secondary | ICD-10-CM

## 2021-12-14 ENCOUNTER — Ambulatory Visit: Payer: 59

## 2021-12-14 ENCOUNTER — Ambulatory Visit
Admission: RE | Admit: 2021-12-14 | Discharge: 2021-12-14 | Disposition: A | Discharge status: Home / Self Care | Payer: 59 | Source: Ambulatory Visit | Attending: Internal Medicine | Admitting: Internal Medicine

## 2021-12-14 DIAGNOSIS — R928 Other abnormal and inconclusive findings on diagnostic imaging of breast: Secondary | ICD-10-CM

## 2021-12-15 ENCOUNTER — Other Ambulatory Visit: Payer: Self-pay | Admitting: Internal Medicine

## 2021-12-15 DIAGNOSIS — E1121 Type 2 diabetes mellitus with diabetic nephropathy: Secondary | ICD-10-CM

## 2021-12-21 ENCOUNTER — Other Ambulatory Visit: Payer: Self-pay | Admitting: Internal Medicine

## 2021-12-21 DIAGNOSIS — I10 Essential (primary) hypertension: Secondary | ICD-10-CM

## 2021-12-23 ENCOUNTER — Other Ambulatory Visit: Payer: Self-pay | Admitting: Internal Medicine

## 2021-12-23 DIAGNOSIS — E1121 Type 2 diabetes mellitus with diabetic nephropathy: Secondary | ICD-10-CM

## 2022-01-03 ENCOUNTER — Emergency Department (HOSPITAL_COMMUNITY)
Admission: EM | Admit: 2022-01-03 | Discharge: 2022-01-04 | Discharge status: Against Medical Advice | Payer: 59 | Attending: Emergency Medicine | Admitting: Emergency Medicine

## 2022-01-03 ENCOUNTER — Other Ambulatory Visit: Payer: Self-pay

## 2022-01-03 DIAGNOSIS — Z5321 Procedure and treatment not carried out due to patient leaving prior to being seen by health care provider: Secondary | ICD-10-CM | POA: Insufficient documentation

## 2022-01-03 DIAGNOSIS — R42 Dizziness and giddiness: Secondary | ICD-10-CM | POA: Diagnosis not present

## 2022-01-03 DIAGNOSIS — R531 Weakness: Secondary | ICD-10-CM | POA: Diagnosis not present

## 2022-01-03 LAB — CBG MONITORING, ED
Glucose-Capillary: 259 mg/dL — ABNORMAL HIGH (ref 70–99)
Glucose-Capillary: 224 mg/dL — ABNORMAL HIGH (ref 70–99)
Glucose-Capillary: 189 mg/dL — ABNORMAL HIGH (ref 70–99)

## 2022-01-03 LAB — CBC
HCT: 48.2 % — ABNORMAL HIGH (ref 36.0–46.0)
Hemoglobin: 15 g/dL (ref 12.0–15.0)
MCH: 26.1 pg (ref 26.0–34.0)
MCHC: 31.1 g/dL (ref 30.0–36.0)
MCV: 84 fL (ref 80.0–100.0)
Platelets: 277 10*3/uL (ref 150–400)
RBC: 5.74 MIL/uL — ABNORMAL HIGH (ref 3.87–5.11)
RDW: 13.2 % (ref 11.5–15.5)
WBC: 6.8 10*3/uL (ref 4.0–10.5)
nRBC: 0 % (ref 0.0–0.2)

## 2022-01-03 LAB — COMPREHENSIVE METABOLIC PANEL
ALT: 13 U/L (ref 0–44)
AST: 15 U/L (ref 15–41)
Albumin: 3.7 g/dL (ref 3.5–5.0)
Alkaline Phosphatase: 60 U/L (ref 38–126)
Anion gap: 11 (ref 5–15)
BUN: 23 mg/dL (ref 8–23)
CO2: 24 mmol/L (ref 22–32)
Calcium: 9.4 mg/dL (ref 8.9–10.3)
Chloride: 102 mmol/L (ref 98–111)
Creatinine, Ser: 1.33 mg/dL — ABNORMAL HIGH (ref 0.44–1.00)
GFR, Estimated: 41 mL/min — ABNORMAL LOW (ref >60)
Glucose, Bld: 213 mg/dL — ABNORMAL HIGH (ref 70–99)
Potassium: 3.7 mmol/L (ref 3.5–5.1)
Sodium: 137 mmol/L (ref 135–145)
Total Bilirubin: 0.6 mg/dL (ref 0.3–1.2)
Total Protein: 8 g/dL (ref 6.5–8.1)

## 2022-01-03 LAB — BLOOD GAS, VENOUS
Acid-Base Excess: 6.1 mmol/L — ABNORMAL HIGH (ref 0.0–2.0)
Bicarbonate: 31.8 mmol/L — ABNORMAL HIGH (ref 20.0–28.0)
O2 Saturation: 63.7 %
Patient temperature: 37
pCO2, Ven: 49 mmHg (ref 44–60)
pH, Ven: 7.42 (ref 7.25–7.43)
pO2, Ven: 40 mmHg (ref 32–45)

## 2022-01-03 LAB — BETA-HYDROXYBUTYRIC ACID: Beta-Hydroxybutyric Acid: 0.2 mmol/L (ref 0.05–0.27)

## 2022-01-03 NOTE — ED Triage Notes (Signed)
Pt BIB EMS from home c/o dizziness and weakness. Pt states blood sugar at home was high. Cbg 381 for ems.

## 2022-01-04 ENCOUNTER — Encounter: Payer: Self-pay | Admitting: Internal Medicine

## 2022-01-04 ENCOUNTER — Ambulatory Visit (INDEPENDENT_AMBULATORY_CARE_PROVIDER_SITE_OTHER): Payer: 59 | Admitting: Internal Medicine

## 2022-01-04 VITALS — BP 128/62 | HR 69 | Temp 97.8°F | Ht 60.0 in | Wt 137.0 lb

## 2022-01-04 DIAGNOSIS — B3731 Acute candidiasis of vulva and vagina: Secondary | ICD-10-CM | POA: Diagnosis not present

## 2022-01-04 DIAGNOSIS — R8290 Unspecified abnormal findings in urine: Secondary | ICD-10-CM

## 2022-01-04 DIAGNOSIS — E785 Hyperlipidemia, unspecified: Secondary | ICD-10-CM

## 2022-01-04 DIAGNOSIS — R300 Dysuria: Secondary | ICD-10-CM

## 2022-01-04 DIAGNOSIS — E1121 Type 2 diabetes mellitus with diabetic nephropathy: Secondary | ICD-10-CM | POA: Diagnosis not present

## 2022-01-04 LAB — URINALYSIS, ROUTINE W REFLEX MICROSCOPIC
Bilirubin Urine: NEGATIVE
Hgb urine dipstick: NEGATIVE
Ketones, ur: NEGATIVE
Leukocytes,Ua: NEGATIVE
Nitrite: NEGATIVE
RBC / HPF: NONE SEEN (ref 0–?)
Specific Gravity, Urine: 1.015 (ref 1.000–1.030)
Total Protein, Urine: NEGATIVE
Urine Glucose: >=1000 — AB
Urobilinogen, UA: 0.2 (ref 0.0–1.0)
pH: 5.5 (ref 5.0–8.0)

## 2022-01-04 LAB — HEMOGLOBIN A1C: Hgb A1c MFr Bld: 9.1 % — ABNORMAL HIGH (ref 4.6–6.5)

## 2022-01-04 MED ORDER — TOUJEO MAX SOLOSTAR 300 UNIT/ML ~~LOC~~ SOPN: 30.0000 [IU] | PEN_INJECTOR | Freq: Every day | SUBCUTANEOUS | 3 refills | Status: DC

## 2022-01-04 MED ORDER — FLUCONAZOLE 150 MG PO TABS: ORAL_TABLET | ORAL | 1 refills | Status: DC

## 2022-01-04 NOTE — Progress Notes (Signed)
Patient ID: Michelle Lopez, female   DOB: 24-Oct-1945, 76 y.o.   MRN: [id number]        Chief Complaint: follow up with daughter Michelle Lopez/needs work note, with yeast vaginitis d/c, urine foul smell, DM       HPI:  Michelle Lopez is a 76 y.o. female here overall non toxic but with vaginal d/c with itching fish smell, also bad smelling urine it seems but Denies urinary symptoms such as dysuria, frequency, urgency, flank pain, hematuria or n/v, fever, chills.  Blood sugars have been often in the 300's recently, sometimes less.  Pt denies chest pain, increased sob or doe, wheezing, orthopnea, PND, increased LE swelling, palpitations, dizziness or syncope.   Pt denies polydipsia, polyuria, or new focal neuro s/s.    Pt denies fever, wt loss, night sweats, loss of appetite, or other constitutional symptoms  Lost 5 lbs recently?  Wt Readings from Last 3 Encounters:  01/04/22 137 lb (62.1 kg)  01/03/22 142 lb 3.2 oz (64.5 kg)  11/08/21 142 lb 3.2 oz (64.5 kg)   BP Readings from Last 3 Encounters:  01/04/22 128/62  01/03/22 (!) 128/59  11/08/21 122/70         Past Medical History:  Diagnosis Date   Cerebrovascular disease, unspecified    Cocaine abuse, unspecified    Diabetes mellitus    type 2, uncontrolled   Diarrhea    Essential hypertension, benign    Hyperlipidemia    Stroke ([place])    x7   Past Surgical History:  Procedure Laterality Date   TOTAL HIP ARTHROPLASTY  1970   Dr. Rodell Perna    reports that she has never smoked. She has never used smokeless tobacco. She reports that she does not drink alcohol and does not use drugs. family history is not on file. Allergies  Allergen Reactions   Amlodipine Other (See Comments)    Ankle edema   Crestor [Rosuvastatin Calcium]     myalgias   Morphine Hives   Pravastatin Sodium Rash   Simvastatin Rash   Current Outpatient Medications on File Prior to Visit  Medication Sig Dispense Refill   aspirin 81 MG tablet Take 81 mg by mouth  daily.     atorvastatin (LIPITOR) 20 MG tablet TAKE 1 TABLET BY MOUTH EVERY DAY 90 tablet 0   Blood Glucose Monitoring Suppl (CONTOUR NEXT EZ) w/Device KIT 1 Act by Does not apply route 2 (two) times daily. 1 kit 3   CLICKFINE PEN NEEDLES 31G X 6 MM MISC USE TO INJECT insulin UP TO 5 TIMES DAILY 300 each 1   clopidogrel (PLAVIX) 75 MG tablet TAKE 1 TABLET BY MOUTH EVERY DAY 90 tablet 0   dapagliflozin propanediol (FARXIGA) 10 MG TABS tablet Take 1 tablet (10 mg total) by mouth daily before breakfast. 90 tablet 1   hydrochlorothiazide (MICROZIDE) 12.5 MG capsule TAKE 1 CAPSULE BY MOUTH EVERY DAY 90 capsule 0   Lancets (ONETOUCH ULTRASOFT) lancets Use to check blood sugars twice a day 300 each 1   metoprolol tartrate (LOPRESSOR) 25 MG tablet TAKE 1/2 TABLET BY MOUTH 2 TIMES DAILY 90 tablet 0   tamsulosin (FLOMAX) 0.4 MG CAPS capsule TAKE 1 CAPSULE BY MOUTH EVERY MORNING 90 capsule 0   No current facility-administered medications on file prior to visit.        ROS:  All others reviewed and negative.  Objective        PE:  BP 128/62 (BP Location: Right Arm, Patient Position:  Sitting, Cuff Size: Normal)   Pulse 69   Temp 97.8 F (36.6 C) (Oral)   Ht 5' (1.524 m)   Wt 137 lb (62.1 kg)   SpO2 94%   BMI 26.76 kg/m                 Constitutional: Pt appears in NAD               HENT: Head: NCAT.                Right Ear: External ear normal.                 Left Ear: External ear normal.                Eyes: . Pupils are equal, round, and reactive to light. Conjunctivae and EOM are normal               Nose: without d/c or deformity               Neck: Neck supple. Gross normal ROM               Cardiovascular: Normal rate and regular rhythm.                 Pulmonary/Chest: Effort normal and breath sounds without rales or wheezing.                Abd:  Soft, NT, ND, + BS, no organomegaly               Neurological: Pt is alert. At baseline orientation, motor grossly intact                Skin: Skin is warm. No rashes, no other new lesions, LE edema - none               Psychiatric: Pt behavior is normal without agitation   Micro: none  Cardiac tracings I have personally interpreted today:  none  Pertinent Radiological findings (summarize): none   Lab Results  Component Value Date   WBC 6.8 01/03/2022   HGB 15.0 01/03/2022   HCT 48.2 (H) 01/03/2022   PLT 277 01/03/2022   GLUCOSE 213 (H) 01/03/2022   CHOL 142 05/02/2021   TRIG 155.0 (H) 05/02/2021   HDL 27.20 (L) 05/02/2021   LDLDIRECT 100.0 07/06/2015   LDLCALC 84 05/02/2021   ALT 13 01/03/2022   AST 15 01/03/2022   NA 137 01/03/2022   K 3.7 01/03/2022   CL 102 01/03/2022   CREATININE 1.33 (H) 01/03/2022   BUN 23 01/03/2022   CO2 24 01/03/2022   TSH 1.35 05/02/2021   INR 0.9 04/03/2019   HGBA1C 9.1 (H) 01/04/2022   MICROALBUR <0.7 07/31/2018   Assessment/Plan:  Michelle Lopez is a 76 y.o. Black or African American [2] female with  has a past medical history of Cerebrovascular disease, unspecified, Cocaine abuse, unspecified, Diabetes mellitus, Diarrhea, Essential hypertension, benign, Hyperlipidemia, and Stroke ([place]).  DM (diabetes mellitus), type 2 with renal complications (HCC) Recently uncontrolled for unclear reason, non toxic without other physical stressor; is eating better at her daughters house; ok for increased toujeo to 30 units, but may need higher than this,  to f/u any worsening symptoms or concerns  Hyperlipidemia with target LDL less than 70 Lab Results  Component Value Date   LDLCALC 84 05/02/2021   Uncontrolled, pt to continue current statin lipitor 20 mg as declines change, for lower chol diet  Foul smelling urine Exam benign, for urine and cultrue today, with tx pending results  Yeast vaginitis Mild to mod, for diflucan course, to f/u any worsening symptoms or concerns  Followup: Return in about 3 months (around 04/06/2022) for to Dr Ronnald Ramp.  Cathlean Cower, MD 01/07/2022 8:42 PM [facility]

## 2022-01-04 NOTE — Patient Instructions (Addendum)
Ok to increase the Toujeo to 30 units  Please continue to monitor your sugars, and let us know if still over 130 to 150 range, for a higher dose of toujeo  Please take all new medication as prescribed - the diflucan  Please continue all other medications as before, and refills have been done if requested.  Please have the pharmacy call with any other refills you may need.  Please continue your efforts at being more active, low cholesterol diet, and weight control.  Please keep your appointments with your specialists as you may have planned  Please go to the LAB at the blood drawing area for the tests to be done  You will be contacted by phone if any changes need to be made immediately.  Otherwise, you will receive a letter about your results with an explanation, but please check with MyChart first.  Please remember to sign up for MyChart if you have not done so, as this will be important to you in the future with finding out test results, communicating by private email, and scheduling acute appointments online when needed.  Please make an Appointment to return in 3 months, or sooner if needed, to Dr Ronnald Ramp

## 2022-01-05 LAB — URINE CULTURE: Result:: NO GROWTH

## 2022-01-06 ENCOUNTER — Telehealth: Payer: Self-pay

## 2022-01-06 NOTE — Telephone Encounter (Signed)
Patient was seen in office 01/04/2022

## 2022-01-07 ENCOUNTER — Encounter: Payer: Self-pay | Admitting: Internal Medicine

## 2022-01-07 DIAGNOSIS — R8290 Unspecified abnormal findings in urine: Secondary | ICD-10-CM | POA: Insufficient documentation

## 2022-01-07 DIAGNOSIS — B3731 Acute candidiasis of vulva and vagina: Secondary | ICD-10-CM | POA: Insufficient documentation

## 2022-01-07 NOTE — Assessment & Plan Note (Signed)
Mild to mod, for diflucan course,  to f/u any worsening symptoms or concerns 

## 2022-01-07 NOTE — Assessment & Plan Note (Signed)
Exam benign, for urine and cultrue today, with tx pending results

## 2022-01-07 NOTE — Assessment & Plan Note (Signed)
Lab Results  Component Value Date   LDLCALC 84 05/02/2021   Uncontrolled, pt to continue current statin lipitor 20 mg as declines change, for lower chol diet

## 2022-01-07 NOTE — Assessment & Plan Note (Signed)
Recently uncontrolled for unclear reason, non toxic without other physical stressor; is eating better at her daughters house; ok for increased toujeo to 30 units, but may need higher than this,  to f/u any worsening symptoms or concerns

## 2022-02-21 ENCOUNTER — Other Ambulatory Visit: Payer: Self-pay | Admitting: Internal Medicine

## 2022-02-22 ENCOUNTER — Encounter: Payer: Self-pay | Admitting: Family Medicine

## 2022-02-22 ENCOUNTER — Ambulatory Visit (INDEPENDENT_AMBULATORY_CARE_PROVIDER_SITE_OTHER): Payer: 59 | Admitting: Family Medicine

## 2022-02-22 VITALS — BP 126/60 | HR 66 | Temp 97.4°F | Ht 60.0 in | Wt 138.0 lb

## 2022-02-22 DIAGNOSIS — E559 Vitamin D deficiency, unspecified: Secondary | ICD-10-CM

## 2022-02-22 DIAGNOSIS — N1832 Chronic kidney disease, stage 3b: Secondary | ICD-10-CM | POA: Diagnosis not present

## 2022-02-22 DIAGNOSIS — E538 Deficiency of other specified B group vitamins: Secondary | ICD-10-CM

## 2022-02-22 DIAGNOSIS — R5383 Other fatigue: Secondary | ICD-10-CM | POA: Diagnosis not present

## 2022-02-22 LAB — COMPREHENSIVE METABOLIC PANEL
ALT: 13 U/L (ref 0–35)
AST: 18 U/L (ref 0–37)
Albumin: 4 g/dL (ref 3.5–5.2)
Alkaline Phosphatase: 71 U/L (ref 39–117)
BUN: 28 mg/dL — ABNORMAL HIGH (ref 6–23)
CO2: 31 mEq/L (ref 19–32)
Calcium: 9.5 mg/dL (ref 8.4–10.5)
Chloride: 99 mEq/L (ref 96–112)
Creatinine, Ser: 1.3 mg/dL — ABNORMAL HIGH (ref 0.40–1.20)
GFR: 39.24 mL/min — ABNORMAL LOW (ref >60.00)
Glucose, Bld: 124 mg/dL — ABNORMAL HIGH (ref 70–99)
Potassium: 4.2 mEq/L (ref 3.5–5.1)
Sodium: 138 mEq/L (ref 135–145)
Total Bilirubin: 0.3 mg/dL (ref 0.2–1.2)
Total Protein: 7.9 g/dL (ref 6.0–8.3)

## 2022-02-22 LAB — CBC WITH DIFFERENTIAL/PLATELET
Basophils Absolute: 0.1 10*3/uL (ref 0.0–0.1)
Basophils Relative: 1 % (ref 0.0–3.0)
Eosinophils Absolute: 0.1 10*3/uL (ref 0.0–0.7)
Eosinophils Relative: 0.8 % (ref 0.0–5.0)
HCT: 45.2 % (ref 36.0–46.0)
Hemoglobin: 14.7 g/dL (ref 12.0–15.0)
Lymphocytes Relative: 46.2 % — ABNORMAL HIGH (ref 12.0–46.0)
Lymphs Abs: 3.6 10*3/uL (ref 0.7–4.0)
MCHC: 32.5 g/dL (ref 30.0–36.0)
MCV: 80.9 fl (ref 78.0–100.0)
Monocytes Absolute: 0.7 10*3/uL (ref 0.1–1.0)
Monocytes Relative: 8.5 % (ref 3.0–12.0)
Neutro Abs: 3.4 10*3/uL (ref 1.4–7.7)
Neutrophils Relative %: 43.5 % (ref 43.0–77.0)
Platelets: 252 10*3/uL (ref 150.0–400.0)
RBC: 5.59 Mil/uL — ABNORMAL HIGH (ref 3.87–5.11)
RDW: 13.4 % (ref 11.5–15.5)
WBC: 7.9 10*3/uL (ref 4.0–10.5)

## 2022-02-22 LAB — T4, FREE: Free T4: 0.83 ng/dL (ref 0.60–1.60)

## 2022-02-22 LAB — TSH: TSH: 1.44 u[IU]/mL (ref 0.35–5.50)

## 2022-02-22 LAB — VITAMIN D 25 HYDROXY (VIT D DEFICIENCY, FRACTURES): VITD: 45.01 ng/mL (ref 30.00–100.00)

## 2022-02-22 LAB — VITAMIN B12: Vitamin B-12: 232 pg/mL (ref 211–911)

## 2022-02-22 NOTE — Progress Notes (Signed)
Her labs are stable. Her vitamin D is in normal range and her vitamin B12 is low normal. She can start taking an over the counter vitamin B12 1,000 mcg daily. Nothing here to explain her fatigue. I recommend a follow up with her PCP for chronic health conditions in the next month or two.

## 2022-02-22 NOTE — Patient Instructions (Signed)
Please go downstairs for labs and a urine sample.   We will be in touch with your results.   Please follow up with your primary care provider regarding diabetes and chronic health conditions.     Fatigue If you have fatigue, you feel tired all the time and have a lack of energy or a lack of motivation. Fatigue may make it difficult to start or complete tasks because of exhaustion. Occasional or mild fatigue is often a normal response to activity or life. However, long-term (chronic) or extreme fatigue may be a symptom of a medical condition such as: Depression. Not having enough red blood cells or hemoglobin in the blood (anemia). A problem with a small gland located in the lower front part of the neck (thyroid disorder). Rheumatologic conditions. These are problems related to the body's defense system (immune system). Infections, especially certain viral infections. Fatigue can also lead to negative health outcomes over time. Follow these instructions at home: Medicines Take over-the-counter and prescription medicines only as told by your health care provider. Take a multivitamin if told by your health care provider. Do not use herbal or dietary supplements unless they are approved by your health care provider. Eating and drinking  Avoid heavy meals in the evening. Eat a well-balanced diet, which includes lean proteins, whole grains, plenty of fruits and vegetables, and low-fat dairy products. Avoid eating or drinking too many products with caffeine in them. Avoid alcohol. Drink enough fluid to keep your urine pale yellow. Activity  Exercise regularly, as told by your health care provider. Use or practice techniques to help you relax, such as yoga, tai chi, meditation, or massage therapy. Lifestyle Change situations that cause you stress. Try to keep your work and personal schedules in balance. Do not use recreational or illegal drugs. General instructions Monitor your fatigue  for any changes. Go to bed and get up at the same time every day. Avoid fatigue by pacing yourself during the day and getting enough sleep at night. Maintain a healthy weight. Contact a health care provider if: Your fatigue does not get better. You have a fever. You suddenly lose or gain weight. You have headaches. You have trouble falling asleep or sleeping through the night. You feel angry, guilty, anxious, or sad. You have swelling in your legs or another part of your body. Get help right away if: You feel confused, feel like you might faint, or faint. Your vision is blurry or you have a severe headache. You have severe pain in your abdomen, your back, or the area between your waist and hips (pelvis). You have chest pain, shortness of breath, or an irregular or fast heartbeat. You are unable to urinate, or you urinate less than normal. You have abnormal bleeding from the rectum, nose, lungs, nipples, or, if you are female, the vagina. You vomit blood. You have thoughts about hurting yourself or others. These symptoms may be an emergency. Get help right away. Call 911. Do not wait to see if the symptoms will go away. Do not drive yourself to the hospital. Get help right away if you feel like you may hurt yourself or others, or have thoughts about taking your own life. Go to your nearest emergency room or: Call 911. Call the [place] at [phone] or [phone]. This is open 24 hours a day. Text the Crisis Text Line at 863 134 1080. Summary If you have fatigue, you feel tired all the time and have a lack of energy or  a lack of motivation. Fatigue may make it difficult to start or complete tasks because of exhaustion. Long-term (chronic) or extreme fatigue may be a symptom of a medical condition. Exercise regularly, as told by your health care provider. Change situations that cause you stress. Try to keep your work and personal schedules in balance. This  information is not intended to replace advice given to you by your health care provider. Make sure you discuss any questions you have with your health care provider. Document Revised: 11/15/2020 Document Reviewed: 11/15/2020 Elsevier Patient Education  [place].

## 2022-02-22 NOTE — Progress Notes (Signed)
Subjective:     Patient ID: Michelle Lopez, female    DOB: 1945/07/16, 77 y.o.   MRN: [id number]  Chief Complaint  Patient presents with   Fatigue    Has been feeling this way for a few months     HPI Patient is in today for fatigue x several months. Her daughter is with her. Request labs including vitamin deficiencies.   Denies any other symptoms. States fatigue was present at her last visit in November. Nothing new.   Denies fever, chills, night sweats, unexplained weight loss, dizziness, headaches, chest pain, palpitations, shortness of breath, cough, abdominal pain, N/V/D, urinary symptoms, LE edema. No blood in stool or urine.      Health Maintenance Due  Topic Date Due   Zoster Vaccines- Shingrix (1 of 2) Never done   Diabetic kidney evaluation - Urine ACR  07/31/2019   DTaP/Tdap/Td (2 - Tdap) 11/17/2019   COVID-19 Vaccine (4 - 2023-24 season) 10/07/2021    Past Medical History:  Diagnosis Date   Cerebrovascular disease, unspecified    Cocaine abuse, unspecified    Diabetes mellitus    type 2, uncontrolled   Diarrhea    Essential hypertension, benign    Hyperlipidemia    Stroke ([place])    x7    Past Surgical History:  Procedure Laterality Date   TOTAL HIP ARTHROPLASTY  1970   Dr. Rodell Perna    Family History  Problem Relation Age of Onset   Colon cancer Neg Hx     Social History   Socioeconomic History   Marital status: Divorced    Spouse name: Not on file   Number of children: 4   Years of education: Not on file   Highest education level: Not on file  Occupational History   Occupation: Disability  Tobacco Use   Smoking status: Never   Smokeless tobacco: Never  Vaping Use   Vaping Use: Never used  Substance and Sexual Activity   Alcohol use: No    Alcohol/week: 0.0 standard drinks of alcohol   Drug use: No   Sexual activity: Not Currently  Other Topics Concern   Not on file  Social History Narrative   Daily caffeine    Social  Determinants of Health   Financial Resource Strain: Low Risk  (11/08/2021)   Overall Financial Resource Strain (CARDIA)    Difficulty of Paying Living Expenses: Not hard at all  Food Insecurity: No Food Insecurity (11/08/2021)   Hunger Vital Sign    Worried About Running Out of Food in the Last Year: Never true    [place] in the Last Year: Never true  Transportation Needs: No Transportation Needs (11/08/2021)   PRAPARE - Hydrologist (Medical): No    Lack of Transportation (Non-Medical): No  Physical Activity: Inactive (11/08/2021)   Exercise Vital Sign    Days of Exercise per Week: 0 days    Minutes of Exercise per Session: 0 min  Stress: No Stress Concern Present (11/08/2021)   [place]    Feeling of Stress : Not at all  Social Connections: Moderately Isolated (11/08/2021)   Social Connection and Isolation Panel [NHANES]    Frequency of Communication with Friends and Family: More than three times a week    Frequency of Social Gatherings with Friends and Family: More than three times a week    Attends Religious Services: Never    Active Member  of Clubs or Organizations: Yes    Attends Archivist Meetings: Never    Marital Status: Never married  Intimate Partner Violence: Not At Risk (11/08/2021)   Humiliation, Afraid, Rape, and Kick questionnaire    Fear of Current or Ex-Partner: No    Emotionally Abused: No    Physically Abused: No    Sexually Abused: No    Outpatient Medications Prior to Visit  Medication Sig Dispense Refill   aspirin 81 MG tablet Take 81 mg by mouth daily.     atorvastatin (LIPITOR) 20 MG tablet TAKE 1 TABLET BY MOUTH EVERY DAY 90 tablet 0   Blood Glucose Monitoring Suppl (CONTOUR NEXT EZ) w/Device KIT 1 Act by Does not apply route 2 (two) times daily. 1 kit 3   CLICKFINE PEN NEEDLES 31G X 6 MM MISC USE TO INJECT insulin UP TO 5 TIMES DAILY 300 each  1   clopidogrel (PLAVIX) 75 MG tablet TAKE 1 TABLET BY MOUTH EVERY DAY 90 tablet 0   dapagliflozin propanediol (FARXIGA) 10 MG TABS tablet Take 1 tablet (10 mg total) by mouth daily before breakfast. 90 tablet 1   fluconazole (DIFLUCAN) 150 MG tablet 1 tab by mouth every 3 days as needed 2 tablet 1   hydrochlorothiazide (MICROZIDE) 12.5 MG capsule TAKE 1 CAPSULE BY MOUTH EVERY DAY 90 capsule 0   insulin glargine, 2 Unit Dial, (TOUJEO MAX SOLOSTAR) 300 UNIT/ML Solostar Pen Inject 30 Units into the skin daily. 6 mL 3   Lancets (ONETOUCH ULTRASOFT) lancets Use to check blood sugars twice a day 300 each 1   metoprolol tartrate (LOPRESSOR) 25 MG tablet TAKE 1/2 TABLET BY MOUTH 2 TIMES DAILY 90 tablet 0   tamsulosin (FLOMAX) 0.4 MG CAPS capsule TAKE 1 CAPSULE BY MOUTH EVERY MORNING 90 capsule 0   No facility-administered medications prior to visit.    Allergies  Allergen Reactions   Amlodipine Other (See Comments)    Ankle edema   Crestor [Rosuvastatin Calcium]     myalgias   Morphine Hives   Pravastatin Sodium Rash   Simvastatin Rash    ROS     Objective:    Physical Exam Constitutional:      General: She is not in acute distress.    Appearance: She is not ill-appearing.  HENT:     Mouth/Throat:     Mouth: Mucous membranes are moist.     Pharynx: Oropharynx is clear.  Eyes:     Extraocular Movements: Extraocular movements intact.     Conjunctiva/sclera: Conjunctivae normal.     Pupils: Pupils are equal, round, and reactive to light.  Cardiovascular:     Rate and Rhythm: Normal rate and regular rhythm.  Pulmonary:     Effort: Pulmonary effort is normal.     Breath sounds: Normal breath sounds.  Musculoskeletal:        General: Normal range of motion.     Cervical back: Normal range of motion and neck supple. No tenderness.     Right lower leg: No edema.     Left lower leg: No edema.  Lymphadenopathy:     Cervical: No cervical adenopathy.     Upper Body:     Right upper  body: No supraclavicular adenopathy.     Left upper body: No supraclavicular adenopathy.  Skin:    General: Skin is warm and dry.  Neurological:     Mental Status: She is alert. Mental status is at baseline.     Cranial Nerves:  No facial asymmetry.     Sensory: No sensory deficit.     Motor: No weakness.     Coordination: Coordination normal.  Psychiatric:        Mood and Affect: Mood normal.        Behavior: Behavior normal.        Thought Content: Thought content normal.     BP 126/60 (BP Location: Left Arm, Patient Position: Sitting, Cuff Size: Large)   Pulse 66   Temp (!) 97.4 F (36.3 C) (Temporal)   Ht 5' (1.524 m)   Wt 138 lb (62.6 kg)   SpO2 96%   BMI 26.95 kg/m  Wt Readings from Last 3 Encounters:  02/22/22 138 lb (62.6 kg)  01/04/22 137 lb (62.1 kg)  01/03/22 142 lb 3.2 oz (64.5 kg)       Assessment & Plan:   Problem List Items Addressed This Visit       Genitourinary   Stage 3b chronic kidney disease ([place])   Relevant Orders   Comprehensive metabolic panel     Other   B12 deficiency   Relevant Orders   Vitamin B12   Vitamin D deficiency   Relevant Orders   VITAMIN D 25 Hydroxy (Vit-D Deficiency, Fractures)   Other Visit Diagnoses     Fatigue, unspecified type    -  Primary   Relevant Orders   CBC with Differential/Platelet   Comprehensive metabolic panel   TSH   T4, free   Vitamin B12   VITAMIN D 25 Hydroxy (Vit-D Deficiency, Fractures)      She is here today with her daughter with concerns of ongoing fatigue.  Denies all other symptoms.  Fatigue is chronic.  She does have a history of diabetes, CKD and vitamin B12 and vitamin D deficiencies.  I will check labs and a urinalysis to look for underlying etiology.  Recommend they follow-up with her PCP for chronic health conditions.  I am having Michelle Lopez maintain her aspirin, dapagliflozin propanediol, Contour Next EZ, onetouch ultrasoft, atorvastatin, metoprolol tartrate, clopidogrel,  Clickfine Pen Needles, tamsulosin, Toujeo Max SoloStar, fluconazole, and hydrochlorothiazide.  No orders of the defined types were placed in this encounter.

## 2022-03-09 ENCOUNTER — Other Ambulatory Visit: Payer: Self-pay | Admitting: Internal Medicine

## 2022-03-09 DIAGNOSIS — E1121 Type 2 diabetes mellitus with diabetic nephropathy: Secondary | ICD-10-CM

## 2022-03-09 DIAGNOSIS — E785 Hyperlipidemia, unspecified: Secondary | ICD-10-CM

## 2022-03-09 DIAGNOSIS — I739 Peripheral vascular disease, unspecified: Secondary | ICD-10-CM

## 2022-03-09 DIAGNOSIS — I10 Essential (primary) hypertension: Secondary | ICD-10-CM

## 2022-03-27 ENCOUNTER — Ambulatory Visit (INDEPENDENT_AMBULATORY_CARE_PROVIDER_SITE_OTHER): Payer: 59 | Admitting: Podiatry

## 2022-03-27 ENCOUNTER — Encounter: Payer: Self-pay | Admitting: Podiatry

## 2022-03-27 DIAGNOSIS — M79675 Pain in left toe(s): Secondary | ICD-10-CM

## 2022-03-27 DIAGNOSIS — E1151 Type 2 diabetes mellitus with diabetic peripheral angiopathy without gangrene: Secondary | ICD-10-CM

## 2022-03-27 DIAGNOSIS — M79674 Pain in right toe(s): Secondary | ICD-10-CM | POA: Diagnosis not present

## 2022-03-27 DIAGNOSIS — B351 Tinea unguium: Secondary | ICD-10-CM | POA: Diagnosis not present

## 2022-03-27 NOTE — Progress Notes (Signed)
This patient returns to my office for at risk foot care.  This patient requires this care by a professional since this patient will be at risk due to having CKD, PAD and diabetes. This patient is unable to cut nails himself since the patient cannot reach his nails.These nails are painful walking and wearing shoes.  This patient presents for at risk foot care today.  General Appearance  Alert, conversant and in no acute stress.  Vascular  Dorsalis pedis and posterior tibial  pulses are  weakly palpable  bilaterally.  Capillary return is within normal limits  bilaterally. Temperature is within normal limits  bilaterally.  Neurologic  Senn-Weinstein monofilament wire test within normal limits  bilaterally. Muscle power within normal limits bilaterally.  Nails Thick disfigured discolored nails with subungual debris  from hallux to fifth toes bilaterally. No evidence of bacterial infection or drainage bilaterally.  Orthopedic  No limitations of motion  feet .  No crepitus or effusions noted.  No bony pathology or digital deformities noted.  Skin  normotropic skin with no porokeratosis noted bilaterally.  No signs of infections or ulcers noted.     Onychomycosis  Pain in right toes  Pain in left toes  Consent was obtained for treatment procedures.   Mechanical debridement of nails 1-5  bilaterally performed with a nail nipper.  Filed with dremel without incident.    Return office visit      4 months                Told patient to return for periodic foot care and evaluation due to potential at risk complications.   Gardiner Barefoot DPM

## 2022-04-05 ENCOUNTER — Other Ambulatory Visit: Payer: Self-pay | Admitting: Internal Medicine

## 2022-04-05 DIAGNOSIS — I10 Essential (primary) hypertension: Secondary | ICD-10-CM

## 2022-04-06 ENCOUNTER — Ambulatory Visit: Payer: 59 | Admitting: Internal Medicine

## 2022-04-19 ENCOUNTER — Other Ambulatory Visit: Payer: Self-pay | Admitting: Internal Medicine

## 2022-04-19 DIAGNOSIS — N1832 Chronic kidney disease, stage 3b: Secondary | ICD-10-CM

## 2022-04-19 DIAGNOSIS — E1121 Type 2 diabetes mellitus with diabetic nephropathy: Secondary | ICD-10-CM

## 2022-04-24 ENCOUNTER — Other Ambulatory Visit: Payer: Self-pay | Admitting: Internal Medicine

## 2022-04-24 DIAGNOSIS — E1121 Type 2 diabetes mellitus with diabetic nephropathy: Secondary | ICD-10-CM

## 2022-05-08 ENCOUNTER — Ambulatory Visit: Payer: 59 | Admitting: Internal Medicine

## 2022-05-16 ENCOUNTER — Inpatient Hospital Stay: Admission: RE | Admit: 2022-05-16 | Payer: 59 | Source: Ambulatory Visit

## 2022-05-17 ENCOUNTER — Ambulatory Visit: Payer: 59 | Admitting: Internal Medicine

## 2022-05-25 ENCOUNTER — Other Ambulatory Visit: Payer: Self-pay | Admitting: Internal Medicine

## 2022-05-25 DIAGNOSIS — E785 Hyperlipidemia, unspecified: Secondary | ICD-10-CM

## 2022-05-25 DIAGNOSIS — E1121 Type 2 diabetes mellitus with diabetic nephropathy: Secondary | ICD-10-CM

## 2022-05-25 DIAGNOSIS — I739 Peripheral vascular disease, unspecified: Secondary | ICD-10-CM

## 2022-05-25 DIAGNOSIS — I10 Essential (primary) hypertension: Secondary | ICD-10-CM

## 2022-06-01 ENCOUNTER — Encounter: Payer: Self-pay | Admitting: Internal Medicine

## 2022-06-01 ENCOUNTER — Ambulatory Visit (INDEPENDENT_AMBULATORY_CARE_PROVIDER_SITE_OTHER): Payer: 59 | Admitting: Internal Medicine

## 2022-06-01 VITALS — BP 122/62 | HR 61 | Temp 98.0°F | Ht 60.0 in | Wt 131.0 lb

## 2022-06-01 DIAGNOSIS — E538 Deficiency of other specified B group vitamins: Secondary | ICD-10-CM

## 2022-06-01 DIAGNOSIS — N1832 Chronic kidney disease, stage 3b: Secondary | ICD-10-CM

## 2022-06-01 DIAGNOSIS — E785 Hyperlipidemia, unspecified: Secondary | ICD-10-CM

## 2022-06-01 DIAGNOSIS — E1121 Type 2 diabetes mellitus with diabetic nephropathy: Secondary | ICD-10-CM

## 2022-06-01 LAB — BASIC METABOLIC PANEL
BUN: 25 mg/dL — ABNORMAL HIGH (ref 6–23)
CO2: 31 mEq/L (ref 19–32)
Calcium: 9.7 mg/dL (ref 8.4–10.5)
Chloride: 96 mEq/L (ref 96–112)
Creatinine, Ser: 1.58 mg/dL — ABNORMAL HIGH (ref 0.40–1.20)
GFR: 30.99 mL/min — ABNORMAL LOW (ref >60.00)
Glucose, Bld: 186 mg/dL — ABNORMAL HIGH (ref 70–99)
Potassium: 3.7 mEq/L (ref 3.5–5.1)
Sodium: 137 mEq/L (ref 135–145)

## 2022-06-01 LAB — URINALYSIS, ROUTINE W REFLEX MICROSCOPIC
Bilirubin Urine: NEGATIVE
Hgb urine dipstick: NEGATIVE
Ketones, ur: NEGATIVE
Leukocytes,Ua: NEGATIVE
Nitrite: NEGATIVE
RBC / HPF: NONE SEEN (ref 0–?)
Specific Gravity, Urine: 1.015 (ref 1.000–1.030)
Total Protein, Urine: NEGATIVE
Urine Glucose: >=1000 — AB
Urobilinogen, UA: 0.2 (ref 0.0–1.0)
pH: 6 (ref 5.0–8.0)

## 2022-06-01 LAB — MICROALBUMIN / CREATININE URINE RATIO
Creatinine,U: 111.6 mg/dL
Microalb Creat Ratio: 0.7 mg/g (ref 0.0–30.0)
Microalb, Ur: 0.8 mg/dL (ref 0.0–1.9)

## 2022-06-01 LAB — LIPID PANEL
Cholesterol: 179 mg/dL (ref 0–200)
HDL: 37.2 mg/dL — ABNORMAL LOW (ref >39.00)
LDL Cholesterol: 104 mg/dL — ABNORMAL HIGH (ref 0–99)
NonHDL: 142.2
Total CHOL/HDL Ratio: 5
Triglycerides: 190 mg/dL — ABNORMAL HIGH (ref 0.0–149.0)
VLDL: 38 mg/dL (ref 0.0–40.0)

## 2022-06-01 LAB — HEMOGLOBIN A1C: Hgb A1c MFr Bld: 8.1 % — ABNORMAL HIGH (ref 4.6–6.5)

## 2022-06-01 LAB — FOLATE: Folate: 10.3 ng/mL (ref >5.9)

## 2022-06-01 LAB — VITAMIN B12: Vitamin B-12: >1500 pg/mL — ABNORMAL HIGH (ref 211–911)

## 2022-06-01 NOTE — Progress Notes (Signed)
Subjective:  Patient ID: Michelle Lopez, female    DOB: 02/08/1945  Age: 77 y.o. MRN: [id number]  CC: Hypertension and Diabetes   HPI Michelle Lopez presents for f/up ----  She complains of dizziness and lightheadedness.  Her ataxia is unchanged.  She denies any new paresthesias.  She denies chest pain, shortness of breath, diaphoresis, or edema.  Outpatient Medications Prior to Visit  Medication Sig Dispense Refill   aspirin 81 MG tablet Take 81 mg by mouth daily.     atorvastatin (LIPITOR) 20 MG tablet TAKE 1 TABLET BY MOUTH EVERY DAY 90 tablet 0   Blood Glucose Monitoring Suppl (CONTOUR NEXT EZ) w/Device KIT 1 Act by Does not apply route 2 (two) times daily. 1 kit 3   CLICKFINE PEN NEEDLES 31G X 6 MM MISC USE TO INJECT insulin UP TO 5 TIMES DAILY 300 each 1   clopidogrel (PLAVIX) 75 MG tablet TAKE 1 TABLET BY MOUTH EVERY DAY 90 tablet 0   dapagliflozin propanediol (FARXIGA) 10 MG TABS tablet TAKE 1 TABLET BY MOUTH EVERY DAY BEFORE BREAKFAST 90 tablet 0   insulin glargine, 2 Unit Dial, (TOUJEO MAX SOLOSTAR) 300 UNIT/ML Solostar Pen Inject 30 Units into the skin daily. 6 mL 3   metoprolol tartrate (LOPRESSOR) 25 MG tablet TAKE 1/2 TABLET BY MOUTH 2 TIMES DAILY 90 tablet 0   OneTouch UltraSoft 2 Lancets MISC USE TO CHECK BLOOD SUGAR 2 TIMES DAILY 300 each 1   tamsulosin (FLOMAX) 0.4 MG CAPS capsule TAKE 1 CAPSULE BY MOUTH EVERY MORNING 90 capsule 0   fluconazole (DIFLUCAN) 150 MG tablet 1 tab by mouth every 3 days as needed 2 tablet 1   hydrochlorothiazide (MICROZIDE) 12.5 MG capsule TAKE 1 CAPSULE BY MOUTH EVERY DAY 90 capsule 0   No facility-administered medications prior to visit.    ROS Review of Systems  Constitutional: Negative.  Negative for chills, diaphoresis, fatigue and fever.  HENT:  Positive for hearing loss.   Eyes: Negative.  Negative for visual disturbance.  Respiratory:  Negative for cough, chest tightness, shortness of breath and wheezing.   Cardiovascular:   Negative for chest pain, palpitations and leg swelling.  Gastrointestinal:  Negative for abdominal pain, diarrhea, nausea and vomiting.  Endocrine: Negative.   Genitourinary: Negative.  Negative for difficulty urinating and dysuria.  Musculoskeletal:  Positive for gait problem.  Skin: Negative.   Neurological:  Positive for dizziness and light-headedness. Negative for weakness and numbness.  Hematological:  Negative for adenopathy. Does not bruise/bleed easily.  Psychiatric/Behavioral: Negative.      Objective:  BP 122/62 (BP Location: Right Arm, Patient Position: Sitting, Cuff Size: Normal)   Pulse 61   Temp 98 F (36.7 C) (Oral)   Ht 5' (1.524 m)   Wt 131 lb (59.4 kg)   SpO2 95%   BMI 25.58 kg/m   BP Readings from Last 3 Encounters:  06/01/22 122/62  02/22/22 126/60  01/04/22 128/62    Wt Readings from Last 3 Encounters:  06/01/22 131 lb (59.4 kg)  02/22/22 138 lb (62.6 kg)  01/04/22 137 lb (62.1 kg)    Physical Exam Vitals reviewed.  HENT:     Right Ear: Tympanic membrane and ear canal normal. Decreased hearing noted. There is no impacted cerumen.     Left Ear: Tympanic membrane and ear canal normal. Decreased hearing noted. There is no impacted cerumen.     Mouth/Throat:     Mouth: Mucous membranes are moist.  Eyes:  General: No scleral icterus.    Conjunctiva/sclera: Conjunctivae normal.  Cardiovascular:     Rate and Rhythm: Normal rate and regular rhythm.     Heart sounds: No murmur heard. Pulmonary:     Effort: Pulmonary effort is normal.     Breath sounds: No stridor. No wheezing, rhonchi or rales.  Abdominal:     General: Abdomen is flat.     Palpations: There is no mass.     Tenderness: There is no abdominal tenderness. There is no guarding.     Hernia: No hernia is present.  Musculoskeletal:        General: Normal range of motion.     Cervical back: Neck supple.     Right lower leg: No edema.     Left lower leg: No edema.  Lymphadenopathy:      Cervical: No cervical adenopathy.  Skin:    General: Skin is warm and dry.  Neurological:     General: No focal deficit present.     Mental Status: She is alert. Mental status is at baseline.  Psychiatric:        Mood and Affect: Mood normal.        Behavior: Behavior normal.     Lab Results  Component Value Date   WBC 7.9 02/22/2022   HGB 14.7 02/22/2022   HCT 45.2 02/22/2022   PLT 252.0 02/22/2022   GLUCOSE 186 (H) 06/01/2022   CHOL 179 06/01/2022   TRIG 190.0 (H) 06/01/2022   HDL 37.20 (L) 06/01/2022   LDLDIRECT 100.0 07/06/2015   LDLCALC 104 (H) 06/01/2022   ALT 13 02/22/2022   AST 18 02/22/2022   NA 137 06/01/2022   K 3.7 06/01/2022   CL 96 06/01/2022   CREATININE 1.58 (H) 06/01/2022   BUN 25 (H) 06/01/2022   CO2 31 06/01/2022   TSH 1.44 02/22/2022   INR 0.9 04/03/2019   HGBA1C 8.1 (H) 06/01/2022   MICROALBUR 0.8 06/01/2022    No results found.  Assessment & Plan:   B12 deficiency -     Folate; Future -     Vitamin B12; Future  Type 2 diabetes mellitus with diabetic nephropathy, without long-term current use of insulin (HCC)- Her A1c is improved down to 8.1%. -     Microalbumin / creatinine urine ratio; Future -     Hemoglobin A1c; Future -     Basic metabolic panel; Future  Hyperlipidemia with target LDL less than 70 - LDL goal achieved. Doing well on the statin  -     Lipid panel; Future  Stage 3b chronic kidney disease (HCC)- She has a prerenal azotemia with a declining GFR.  Will discontinue hydrochlorothiazide. -     Microalbumin / creatinine urine ratio; Future -     Urinalysis, Routine w reflex microscopic; Future -     Basic metabolic panel; Future     Follow-up: Return in about 4 months (around 10/01/2022).  Sanda Linger, MD

## 2022-06-01 NOTE — Patient Instructions (Signed)

## 2022-06-06 ENCOUNTER — Other Ambulatory Visit: Payer: Self-pay | Admitting: Internal Medicine

## 2022-06-19 ENCOUNTER — Other Ambulatory Visit (HOSPITAL_COMMUNITY): Payer: Self-pay

## 2022-06-23 ENCOUNTER — Other Ambulatory Visit: Payer: Self-pay | Admitting: Internal Medicine

## 2022-06-23 DIAGNOSIS — I10 Essential (primary) hypertension: Secondary | ICD-10-CM

## 2022-07-19 ENCOUNTER — Other Ambulatory Visit: Payer: Self-pay | Admitting: Internal Medicine

## 2022-07-19 DIAGNOSIS — N1832 Chronic kidney disease, stage 3b: Secondary | ICD-10-CM

## 2022-07-19 DIAGNOSIS — E1121 Type 2 diabetes mellitus with diabetic nephropathy: Secondary | ICD-10-CM

## 2022-07-26 ENCOUNTER — Ambulatory Visit: Payer: 59 | Admitting: Podiatry

## 2022-08-04 ENCOUNTER — Telehealth: Payer: Self-pay | Admitting: Internal Medicine

## 2022-08-04 NOTE — Telephone Encounter (Signed)
Patient's daughter Armando Reichert has gotten FMLA paper work from Dr. Yetta Barre to care for her mother. She had to take 06/12/22 and 06/14/22 off of work for that reason and her job is requesting a letter from Dr. Yetta Barre stating that's why she used those 2 days. Armando Reichert works for [employer] and they said it can be faxed to the leave of absence coordinator in HR, [employer] Her fax number is [phone]. Armando Reichert would like a call at [phone] when it is faxed.

## 2022-08-04 NOTE — Telephone Encounter (Signed)
Letter has been done and faxed to number provided.  Armando Reichert, daughter, has been informed letter faxed.

## 2022-08-18 ENCOUNTER — Other Ambulatory Visit: Payer: Self-pay | Admitting: Internal Medicine

## 2022-08-18 DIAGNOSIS — I739 Peripheral vascular disease, unspecified: Secondary | ICD-10-CM

## 2022-08-18 DIAGNOSIS — I10 Essential (primary) hypertension: Secondary | ICD-10-CM

## 2022-08-18 DIAGNOSIS — E1121 Type 2 diabetes mellitus with diabetic nephropathy: Secondary | ICD-10-CM

## 2022-08-18 DIAGNOSIS — E785 Hyperlipidemia, unspecified: Secondary | ICD-10-CM

## 2022-08-31 ENCOUNTER — Ambulatory Visit: Payer: 59 | Admitting: Internal Medicine

## 2022-10-01 ENCOUNTER — Other Ambulatory Visit: Payer: Self-pay | Admitting: Internal Medicine

## 2022-10-01 DIAGNOSIS — I10 Essential (primary) hypertension: Secondary | ICD-10-CM

## 2022-10-02 ENCOUNTER — Ambulatory Visit: Payer: 59 | Admitting: Internal Medicine

## 2022-10-05 ENCOUNTER — Ambulatory Visit (HOSPITAL_COMMUNITY)
Admission: EM | Admit: 2022-10-05 | Discharge: 2022-10-05 | Disposition: A | Discharge status: Home / Self Care | Payer: 59 | Attending: Family Medicine | Admitting: Family Medicine

## 2022-10-05 ENCOUNTER — Encounter (HOSPITAL_COMMUNITY): Payer: Self-pay

## 2022-10-05 ENCOUNTER — Ambulatory Visit (INDEPENDENT_AMBULATORY_CARE_PROVIDER_SITE_OTHER): Payer: 59

## 2022-10-05 DIAGNOSIS — M79672 Pain in left foot: Secondary | ICD-10-CM

## 2022-10-05 DIAGNOSIS — S92302A Fracture of unspecified metatarsal bone(s), left foot, initial encounter for closed fracture: Secondary | ICD-10-CM

## 2022-10-05 DIAGNOSIS — I10 Essential (primary) hypertension: Secondary | ICD-10-CM

## 2022-10-05 MED ORDER — HYDROCODONE-ACETAMINOPHEN 5-325 MG PO TABS: 0.5000 | ORAL_TABLET | Freq: Four times a day (QID) | ORAL | 0 refills | Status: DC | PRN

## 2022-10-05 NOTE — ED Triage Notes (Signed)
Patient reports that she was on the toilet and paper was on the floor and when she tried to stand she slipped and fell hurting her left foot. Patient has redness and swelling present.  Patient denies taking anything for pain.

## 2022-10-05 NOTE — ED Notes (Addendum)
Patient unaware of the meds that she takes on her current med list and the person with her does not know either,

## 2022-10-05 NOTE — Discharge Instructions (Addendum)
Be aware, you have been prescribed pain medications that may cause drowsiness. While taking this medication, do not take any other medications containing acetaminophen (Tylenol). Do not combine with alcohol or recreational drugs. Please do not drive, operate heavy machinery, or take part in activities that require making important decisions while on this medication as your judgement may be clouded.  Your blood pressure was noted to be elevated during your visit today. If you are currently taking medication for high blood pressure, please ensure you are taking this as directed. If you do not have a history of high blood pressure and your blood pressure remains persistently elevated, you may need to begin taking a medication at some point. You may return here within the next few days to recheck if unable to see your primary care provider or if you do not have a one.  BP (!) 162/72 (BP Location: Right Arm)   Pulse 62   Temp 98.4 F (36.9 C) (Oral)   Resp 16   SpO2 93%   BP Readings from Last 3 Encounters:  10/05/22 (!) 162/72  06/01/22 122/62  02/22/22 126/60

## 2022-10-05 NOTE — ED Provider Notes (Signed)
[facility]   [id number] 10/05/22 Arrival Time: 1013  ASSESSMENT & PLAN:  1. Left foot pain   2. Multiple closed fractures of metatarsal bone of left foot, initial encounter   3. Elevated blood pressure reading with diagnosis of hypertension    I have personally viewed and independently interpreted the imaging studies ordered this visit. L FOOT: fractures of distal 2/3/4 metatarsals.   Discharge Medication List as of 10/05/2022 11:20 AM     START taking these medications   Details  HYDROcodone-acetaminophen (NORCO/VICODIN) 5-325 MG tablet Take 0.5-1 tablets by mouth every 6 (six) hours as needed for moderate pain or severe pain., Starting Thu 10/05/2022, Normal        Orders Placed This Encounter  Procedures   DG Foot Complete Left   Ambulatory referral to Podiatry   Apply CAM boot  Has walker at home to assist with ambulation.    Follow-up Information     Call  [facility]).   Contact information: [address] [place],  [place]  [address]  [phone]               Orders Placed This Encounter  Procedures   Ambulatory referral to Podiatry    Referral Priority:   Routine    Referral Type:   Consultation    Referral Reason:   Specialty Services Required    Requested Specialty:   Podiatry    Number of Visits Requested:   1     Discharge Instructions      Be aware, you have been prescribed pain medications that may cause drowsiness. While taking this medication, do not take any other medications containing acetaminophen (Tylenol). Do not combine with alcohol or recreational drugs. Please do not drive, operate heavy machinery, or take part in activities that require making important decisions while on this medication as your judgement may be clouded.  Your blood pressure was noted to be elevated during your visit today. If you are currently taking medication for high blood pressure, please ensure you are taking this  as directed. If you do not have a history of high blood pressure and your blood pressure remains persistently elevated, you may need to begin taking a medication at some point. You may return here within the next few days to recheck if unable to see your primary care provider or if you do not have a one.  BP (!) 162/72 (BP Location: Right Arm)   Pulse 62   Temp 98.4 F (36.9 C) (Oral)   Resp 16   SpO2 93%   BP Readings from Last 3 Encounters:  10/05/22 (!) 162/72  06/01/22 122/62  02/22/22 126/60          Reviewed expectations re: course of current medical issues. Questions answered. Outlined signs and symptoms indicating need for more acute intervention. Patient verbalized understanding. After Visit Summary given.  SUBJECTIVE: History from: patient. Michelle Lopez is a 77 y.o. female who reports fall today; now with LEFT foot pain. Mild swelling. Pain exacerbated with wt bearing. No extremity sensation changes or weakness. No tx PTA. Denies head injury.  Past Surgical History:  Procedure Laterality Date   TOTAL HIP ARTHROPLASTY  1970   Dr. Annell Greening      OBJECTIVE:  Vitals:   10/05/22 1044  BP: (!) 162/72  Pulse: 62  Resp: 16  Temp: 98.4 F (36.9 C)  TempSrc: Oral  SpO2: 93%    General appearance: alert; no distress HEENT:  [place]; AT Neck: supple with FROM Resp: unlabored respirations Extremities: LLE: warm with well perfused appearance; poorly localized marked tenderness over left distal dorsal foot; without gross deformities; swelling: moderate; bruising: none; ankle and toes with FROM CV: brisk extremity capillary refill of LLE; 2+ DP pulse of LLE. Skin: warm and dry Neuro: normal sensation and strength of LLE Psychological: alert and cooperative; normal mood and affect  Imaging: DG Foot Complete Left  Result Date: 10/05/2022 CLINICAL DATA:  Fall, pain EXAM: LEFT FOOT - COMPLETE 3+ VIEW COMPARISON:  None Available. FINDINGS: Mildly displaced oblique  fractures of the distal necks of the second, third, and fourth metatarsals. No other fracture or dislocation. Joint spaces are preserved. Soft tissue edema about the forefoot. IMPRESSION: 1. Mildly displaced oblique fractures of the distal necks of the second, third, and fourth metatarsals. No other fracture or dislocation. Joint spaces are preserved. 2. Soft tissue edema about the forefoot. Electronically Signed   By: Jearld Lesch M.D.   On: 10/05/2022 11:45      Allergies  Allergen Reactions   Amlodipine Other (See Comments)    Ankle edema   Crestor [Rosuvastatin Calcium]     myalgias   Morphine Hives   Pravastatin Sodium Rash   Simvastatin Rash    Past Medical History:  Diagnosis Date   Cerebrovascular disease, unspecified    Cocaine abuse, unspecified    Diabetes mellitus    type 2, uncontrolled   Diarrhea    Essential hypertension, benign    Hyperlipidemia    Stroke (HCC)    x7   Social History   Socioeconomic History   Marital status: Divorced    Spouse name: Not on file   Number of children: 4   Years of education: Not on file   Highest education level: Not on file  Occupational History   Occupation: Disability  Tobacco Use   Smoking status: Never   Smokeless tobacco: Never  Vaping Use   Vaping status: Never Used  Substance and Sexual Activity   Alcohol use: No    Alcohol/week: 0.0 standard drinks of alcohol   Drug use: No   Sexual activity: Not Currently  Other Topics Concern   Not on file  Social History Narrative   Daily caffeine    Social Determinants of Health   Financial Resource Strain: Low Risk  (11/08/2021)   Overall Financial Resource Strain (CARDIA)    Difficulty of Paying Living Expenses: Not hard at all  Food Insecurity: No Food Insecurity (11/08/2021)   Hunger Vital Sign    Worried About Running Out of Food in the Last Year: Never true    Ran Out of Food in the Last Year: Never true  Transportation Needs: No Transportation Needs  (11/08/2021)   PRAPARE - Administrator, Civil Service (Medical): No    Lack of Transportation (Non-Medical): No  Physical Activity: Inactive (11/08/2021)   Exercise Vital Sign    Days of Exercise per Week: 0 days    Minutes of Exercise per Session: 0 min  Stress: No Stress Concern Present (11/08/2021)   [employer] of Occupational Health - Occupational Stress Questionnaire    Feeling of Stress : Not at all  Social Connections: Moderately Isolated (11/08/2021)   Social Connection and Isolation Panel [NHANES]    Frequency of Communication with Friends and Family: More than three times a week    Frequency of Social Gatherings with Friends and Family: More than three times a week  Attends Religious Services: Never    Active Member of Clubs or Organizations: Yes    Attends Banker Meetings: Never    Marital Status: Never married   Family History  Problem Relation Age of Onset   Colon cancer Neg Hx    Past Surgical History:  Procedure Laterality Date   TOTAL HIP ARTHROPLASTY  1970   Dr. Rayfield Citizen, MD 10/05/22 1304

## 2022-10-16 ENCOUNTER — Ambulatory Visit (INDEPENDENT_AMBULATORY_CARE_PROVIDER_SITE_OTHER): Payer: 59

## 2022-10-16 ENCOUNTER — Ambulatory Visit (INDEPENDENT_AMBULATORY_CARE_PROVIDER_SITE_OTHER): Payer: 59 | Admitting: Podiatry

## 2022-10-16 ENCOUNTER — Encounter: Payer: Self-pay | Admitting: Podiatry

## 2022-10-16 DIAGNOSIS — M84375A Stress fracture, left foot, initial encounter for fracture: Secondary | ICD-10-CM

## 2022-10-17 ENCOUNTER — Ambulatory Visit: Payer: 59 | Admitting: Podiatry

## 2022-10-17 NOTE — Progress Notes (Signed)
Subjective:   Patient ID: Michelle Lopez, female   DOB: 77 y.o.   MRN: [id number]   HPI Patient presents with caregiver in wheelchair with history of fracture approximate week.  She fell and thinks that bones were broken and has a lot of swelling in her left foot and is wearing a boot that she got in urgent care   ROS      Objective:  Physical Exam  Neurovascular status intact negative Denna Haggard' sign was noted quite a bit of forefoot midfoot edema left painful when pressed with patient not doing a good job of elevating or using compression     Assessment:  Problem ready for fracture of the left forefoot left     Plan:  H&P x-rays reviewed and I have recommended surgical shoe continued boot usage but this may be a little easier for her to wear at times.  I dispensed Ace wrap and instructed on elevation compression and patient will be seen back as symptoms indicate but this will take probably 8 to 12 weeks to heal  X-rays indicate fracture of the neck of the second third and fourth metatarsals left

## 2022-10-18 ENCOUNTER — Ambulatory Visit: Payer: 59 | Admitting: Podiatry

## 2022-10-25 ENCOUNTER — Ambulatory Visit: Payer: 59 | Admitting: Podiatry

## 2022-10-30 ENCOUNTER — Telehealth: Payer: Self-pay | Admitting: Internal Medicine

## 2022-10-30 ENCOUNTER — Other Ambulatory Visit: Payer: Self-pay | Admitting: Internal Medicine

## 2022-10-30 DIAGNOSIS — E1121 Type 2 diabetes mellitus with diabetic nephropathy: Secondary | ICD-10-CM

## 2022-10-30 DIAGNOSIS — N1832 Chronic kidney disease, stage 3b: Secondary | ICD-10-CM

## 2022-10-30 NOTE — Telephone Encounter (Signed)
Patient needs an appointment to get a medication refill, but Dr. Yetta Barre' next appointment is not until early November. Patient would like to know if they can be seen sooner so they can get their medication. Best callback for daughter Armando Reichert is [phone].  Best callback for caregiver Ms. Yetta Barre is [phone].

## 2022-11-01 ENCOUNTER — Ambulatory Visit (INDEPENDENT_AMBULATORY_CARE_PROVIDER_SITE_OTHER): Payer: 59 | Admitting: Podiatry

## 2022-11-01 ENCOUNTER — Encounter: Payer: Self-pay | Admitting: Podiatry

## 2022-11-01 DIAGNOSIS — M79674 Pain in right toe(s): Secondary | ICD-10-CM | POA: Diagnosis not present

## 2022-11-01 DIAGNOSIS — E1151 Type 2 diabetes mellitus with diabetic peripheral angiopathy without gangrene: Secondary | ICD-10-CM

## 2022-11-01 DIAGNOSIS — M79675 Pain in left toe(s): Secondary | ICD-10-CM

## 2022-11-01 DIAGNOSIS — B351 Tinea unguium: Secondary | ICD-10-CM | POA: Diagnosis not present

## 2022-11-01 NOTE — Progress Notes (Signed)
Subjective:  Patient ID: Michelle Lopez, female    DOB: Aug 04, 1945,   MRN: [id number]  No chief complaint on file.   77 y.o. female presents for concern of thickened elongated and painful nails that are difficult to trim. Requesting to have them trimmed today. Relates burning and tingling in their feet. Patient is diabetic and last A1c was  Lab Results  Component Value Date   HGBA1C 8.1 (H) 06/01/2022   .   PCP:  [name], MD    . Denies any other pedal complaints. Denies n/v/f/c.   Past Medical History:  Diagnosis Date   Cerebrovascular disease, unspecified    Cocaine abuse, unspecified    Diabetes mellitus    type 2, uncontrolled   Diarrhea    Essential hypertension, benign    Hyperlipidemia    Stroke (HCC)    x7    Objective:  Physical Exam: Vascular: DP/PT pulses 2/4 bilateral. CFT <3 seconds. Absent hair growth on digits. Edema noted to bilateral lower extremities. Xerosis noted bilaterally.  Skin. No lacerations or abrasions bilateral feet. Nails 1-5 bilateral  are thickened discolored and elongated with subungual debris.  Musculoskeletal: MMT 5/5 bilateral lower extremities in DF, PF, Inversion and Eversion. Deceased ROM in DF of ankle joint.  Neurological: Sensation intact to light touch. Protective sensation diminished bilateral.   .   Assessment:   1. Pain due to onychomycosis of toenails of both feet   2. Type II diabetes mellitus with peripheral circulatory disorder (HCC)      Plan:  Patient was evaluated and treated and all questions answered. -Discussed and educated patient on diabetic foot care, especially with  regards to the vascular, neurological and musculoskeletal systems.  -Stressed the importance of good glycemic control and the detriment of not  controlling glucose levels in relation to the foot. -Discussed supportive shoes at all times and checking feet regularly.  -Mechanically debrided all nails 1-5 bilateral using sterile nail nipper  and filed with dremel without incident  -Answered all patient questions -Patient to return  in 3 months for at risk foot care -Patient advised to call the office if any problems or questions arise in the meantime.   Louann Sjogren, DPM

## 2022-11-06 ENCOUNTER — Other Ambulatory Visit: Payer: Self-pay | Admitting: Internal Medicine

## 2022-11-06 ENCOUNTER — Other Ambulatory Visit: Payer: Self-pay

## 2022-11-06 DIAGNOSIS — E1121 Type 2 diabetes mellitus with diabetic nephropathy: Secondary | ICD-10-CM

## 2022-11-09 ENCOUNTER — Ambulatory Visit (INDEPENDENT_AMBULATORY_CARE_PROVIDER_SITE_OTHER): Payer: 59 | Admitting: Internal Medicine

## 2022-11-09 ENCOUNTER — Encounter: Payer: Self-pay | Admitting: Internal Medicine

## 2022-11-09 VITALS — BP 122/68 | HR 60 | Temp 98.3°F | Ht 60.0 in | Wt 129.0 lb

## 2022-11-09 DIAGNOSIS — E785 Hyperlipidemia, unspecified: Secondary | ICD-10-CM

## 2022-11-09 DIAGNOSIS — I739 Peripheral vascular disease, unspecified: Secondary | ICD-10-CM | POA: Diagnosis not present

## 2022-11-09 DIAGNOSIS — E1121 Type 2 diabetes mellitus with diabetic nephropathy: Secondary | ICD-10-CM

## 2022-11-09 DIAGNOSIS — I10 Essential (primary) hypertension: Secondary | ICD-10-CM

## 2022-11-09 DIAGNOSIS — H6121 Impacted cerumen, right ear: Secondary | ICD-10-CM | POA: Diagnosis not present

## 2022-11-09 DIAGNOSIS — K047 Periapical abscess without sinus: Secondary | ICD-10-CM | POA: Diagnosis not present

## 2022-11-09 DIAGNOSIS — E559 Vitamin D deficiency, unspecified: Secondary | ICD-10-CM

## 2022-11-09 DIAGNOSIS — N1832 Chronic kidney disease, stage 3b: Secondary | ICD-10-CM

## 2022-11-09 DIAGNOSIS — Z7984 Long term (current) use of oral hypoglycemic drugs: Secondary | ICD-10-CM

## 2022-11-09 DIAGNOSIS — J069 Acute upper respiratory infection, unspecified: Secondary | ICD-10-CM

## 2022-11-09 DIAGNOSIS — Z7985 Long-term (current) use of injectable non-insulin antidiabetic drugs: Secondary | ICD-10-CM

## 2022-11-09 MED ORDER — DAPAGLIFLOZIN PROPANEDIOL 10 MG PO TABS
10.0000 mg | ORAL_TABLET | Freq: Every day | ORAL | 0 refills | Status: DC
Start: 2022-11-09 — End: 2023-01-22

## 2022-11-09 MED ORDER — METOPROLOL TARTRATE 25 MG PO TABS
12.5000 mg | ORAL_TABLET | Freq: Two times a day (BID) | ORAL | 0 refills | Status: DC
Start: 2022-11-09 — End: 2023-01-26

## 2022-11-09 MED ORDER — CLOPIDOGREL BISULFATE 75 MG PO TABS
75.0000 mg | ORAL_TABLET | Freq: Every day | ORAL | 0 refills | Status: DC
Start: 2022-11-09 — End: 2023-01-26

## 2022-11-09 MED ORDER — AMOXICILLIN-POT CLAVULANATE 875-125 MG PO TABS: 1.0000 | ORAL_TABLET | Freq: Two times a day (BID) | ORAL | 0 refills | Status: DC

## 2022-11-09 MED ORDER — TAMSULOSIN HCL 0.4 MG PO CAPS
0.4000 mg | ORAL_CAPSULE | Freq: Every morning | ORAL | 0 refills | Status: DC
Start: 2022-11-09 — End: 2023-01-26

## 2022-11-09 MED ORDER — ATORVASTATIN CALCIUM 20 MG PO TABS
20.0000 mg | ORAL_TABLET | Freq: Every day | ORAL | 0 refills | Status: DC
Start: 2022-11-09 — End: 2023-01-26

## 2022-11-09 NOTE — Patient Instructions (Signed)
Your right ear was irrigated of wax today  Please take all new medication as prescribed -the antibiotic for the upper respiratory infection, and left lower tooth infection  Please continue all other medications as before, and refills have been done as requested.  Please have the pharmacy call with any other refills you may need.  Please keep your appointments with your specialists as you may have planned  Please see Dr Yetta Barre in 3 months for further refills

## 2022-11-09 NOTE — Progress Notes (Addendum)
Patient ID: Michelle Lopez, female   DOB: [birth year], 77 y.o.   MRN: [id number]        Chief Complaint: follow up right cerumen impaction, URI, left lower dental infection, dm, [id number]       HPI:  Michelle Lopez is a 77 y.o. female   Here with 2-3 days acute onset fever, facial pain, pressure, headache, general weakness and malaise, and clearish d/c, with mild ST and cough, but pt denies chest pain, wheezing, increased sob or doe, orthopnea, PND, increased LE swelling, palpitations, dizziness or syncope.  Also has reduced right hearing for 1 wk.   Pt denies polydipsia, polyuria, or new focal neuro s/s.     Wt Readings from Last 3 Encounters:  11/09/22 129 lb (58.5 kg)  06/01/22 131 lb (59.4 kg)  02/22/22 138 lb (62.6 kg)   BP Readings from Last 3 Encounters:  11/09/22 122/68  10/05/22 (!) 162/72  06/01/22 122/62         Past Medical History:  Diagnosis Date   Cerebrovascular disease, unspecified    Cocaine abuse, unspecified    Diabetes mellitus    type 2, uncontrolled   Diarrhea    Essential hypertension, benign    Hyperlipidemia    Stroke (HCC)    x7   Past Surgical History:  Procedure Laterality Date   TOTAL HIP ARTHROPLASTY  1970   Dr. Annell Greening    reports that she has never smoked. She has never used smokeless tobacco. She reports that she does not drink alcohol and does not use drugs. family history is not on file. Allergies  Allergen Reactions   Amlodipine Other (See Comments)    Ankle edema   Crestor [Rosuvastatin Calcium]     myalgias   Morphine Hives   Pravastatin Sodium Rash   Simvastatin Rash   Current Outpatient Medications on File Prior to Visit  Medication Sig Dispense Refill   aspirin 81 MG tablet Take 81 mg by mouth daily.     Blood Glucose Monitoring Suppl (CONTOUR NEXT EZ) w/Device KIT 1 Act by Does not apply route 2 (two) times daily. 1 kit 3   CLICKFINE PEN NEEDLES 31G X 6 MM MISC USE TO INJECT insulin UP TO 5 TIMES DAILY 300 each 1    HYDROcodone-acetaminophen (NORCO/VICODIN) 5-325 MG tablet Take 0.5-1 tablets by mouth every 6 (six) hours as needed for moderate pain or severe pain. 10 tablet 0   OneTouch UltraSoft 2 Lancets MISC USE TO CHECK BLOOD SUGAR 2 TIMES DAILY 300 each 1   TOUJEO MAX SOLOSTAR 300 UNIT/ML Solostar Pen Inject 30 Units into the skin daily. 6 mL 3   No current facility-administered medications on file prior to visit.        ROS:  All others reviewed and negative.  Objective        PE:  BP 122/68 (BP Location: Right Arm, Patient Position: Sitting, Cuff Size: Normal)   Pulse 60   Temp 98.3 F (36.8 C) (Oral)   Ht 5' (1.524 m)   Wt 129 lb (58.5 kg)   SpO2 99%   BMI 25.19 kg/m                 Constitutional: Pt appears in NAD               HENT: Head: NCAT.                Right Ear: External ear normal.  Right wax impaction resolved and hearing  improved               Left Ear: External ear normal. Bilat tm's with mild erythema.  Max sinus areas tender.  Pharynx with mild erythema, no exudate               Eyes: . Pupils are equal, round, and reactive to light. Conjunctivae and EOM are normal               Nose: without d/c or deformity; mouth with left lower eyetooth loose and swelling, tender gum               Neck: Neck supple. Gross normal ROM               Cardiovascular: Normal rate and regular rhythm.                 Pulmonary/Chest: Effort normal and breath sounds without rales or wheezing.                               Neurological: Pt is alert. At baseline orientation, motor grossly intact               Skin: Skin is warm. No rashes, no other new lesions, LE edema - none               Psychiatric: Pt behavior is normal without agitation   Micro: none  Cardiac tracings I have personally interpreted today:  none  Pertinent Radiological findings (summarize): none   Lab Results  Component Value Date   WBC 7.9 02/22/2022   HGB 14.7 02/22/2022   HCT 45.2 02/22/2022   PLT 252.0  02/22/2022   GLUCOSE 186 (H) 06/01/2022   CHOL 179 06/01/2022   TRIG 190.0 (H) 06/01/2022   HDL 37.20 (L) 06/01/2022   LDLDIRECT 100.0 07/06/2015   LDLCALC 104 (H) 06/01/2022   ALT 13 02/22/2022   AST 18 02/22/2022   NA 137 06/01/2022   K 3.7 06/01/2022   CL 96 06/01/2022   CREATININE 1.58 (H) 06/01/2022   BUN 25 (H) 06/01/2022   CO2 31 06/01/2022   TSH 1.44 02/22/2022   INR 0.9 04/03/2019   HGBA1C 8.1 (H) 06/01/2022   MICROALBUR 0.8 06/01/2022   Assessment/Plan:  Michelle Lopez is a 77 y.o. Black or African American [2] female with  has a past medical history of Cerebrovascular disease, unspecified, Cocaine abuse, unspecified, Diabetes mellitus, Diarrhea, Essential hypertension, benign, Hyperlipidemia, and Stroke (HCC).  DM (diabetes mellitus), type 2 with renal complications (HCC) Lab Results  Component Value Date   HGBA1C 8.1 (H) 06/01/2022   Uncontrolled, goal A1c < 7.5, pt to continue current medical treatment toujeo 30 u every day, farxiga 10 every day as declines change for now   Vitamin D deficiency Last vitamin D Lab Results  Component Value Date   VD25OH 45.01 02/22/2022   Stable, cont oral replacement   Stage 3b chronic kidney disease (HCC) Lab Results  Component Value Date   CREATININE 1.58 (H) 06/01/2022   Stable overall, cont to avoid nephrotoxins   Essential hypertension, benign BP Readings from Last 3 Encounters:  11/09/22 122/68  10/05/22 (!) 162/72  06/01/22 122/62   Stable, pt to continue medical treatment lopressor 1/2 bid   Dental infection Mild to mod, for antibx course augmentin bid course,  to f/u any worsening symptoms or concerns  Cerumen impaction Right now resolved, hearing improved,  to f/u any worsening symptoms or concerns   Acute upper respiratory infection Likely viral, for mucinex bid prn Followup: Return in about 3 months (around 02/09/2023) for follow up to Dr Yetta Barre.  Oliver Barre, MD 11/12/2022 4:55 PM [facility]

## 2022-11-12 ENCOUNTER — Encounter: Payer: Self-pay | Admitting: Internal Medicine

## 2022-11-12 DIAGNOSIS — J069 Acute upper respiratory infection, unspecified: Secondary | ICD-10-CM | POA: Insufficient documentation

## 2022-11-12 DIAGNOSIS — I10 Essential (primary) hypertension: Secondary | ICD-10-CM | POA: Insufficient documentation

## 2022-11-12 DIAGNOSIS — K047 Periapical abscess without sinus: Secondary | ICD-10-CM | POA: Insufficient documentation

## 2022-11-12 DIAGNOSIS — H6120 Impacted cerumen, unspecified ear: Secondary | ICD-10-CM | POA: Insufficient documentation

## 2022-11-12 NOTE — Assessment & Plan Note (Signed)
Lab Results  Component Value Date   HGBA1C 8.1 (H) 06/01/2022   Uncontrolled, goal A1c < 7.5, pt to continue current medical treatment toujeo 30 u every day, farxiga 10 every day as declines change for now

## 2022-11-12 NOTE — Assessment & Plan Note (Signed)
Lab Results  Component Value Date   CREATININE 1.58 (H) 06/01/2022   Stable overall, cont to avoid nephrotoxins

## 2022-11-12 NOTE — Assessment & Plan Note (Signed)
Likely viral, for mucinex bid prn

## 2022-11-12 NOTE — Assessment & Plan Note (Signed)
BP Readings from Last 3 Encounters:  11/09/22 122/68  10/05/22 (!) 162/72  06/01/22 122/62   Stable, pt to continue medical treatment lopressor 1/2 bid

## 2022-11-12 NOTE — Assessment & Plan Note (Signed)
Right now resolved, hearing improved,  to f/u any worsening symptoms or concerns

## 2022-11-12 NOTE — Addendum Note (Signed)
Addended by: Corwin Levins on: 11/12/2022 04:56 PM   Modules accepted: Level of Service

## 2022-11-12 NOTE — Assessment & Plan Note (Signed)
Mild to mod, for antibx course augmentin bid course,  to f/u any worsening symptoms or concerns

## 2022-11-12 NOTE — Assessment & Plan Note (Signed)
Last vitamin D Lab Results  Component Value Date   VD25OH 45.01 02/22/2022   Stable, cont oral replacement

## 2022-11-27 ENCOUNTER — Other Ambulatory Visit: Payer: Self-pay | Admitting: Internal Medicine

## 2022-11-27 DIAGNOSIS — N1832 Chronic kidney disease, stage 3b: Secondary | ICD-10-CM

## 2022-11-27 DIAGNOSIS — E1121 Type 2 diabetes mellitus with diabetic nephropathy: Secondary | ICD-10-CM

## 2022-12-20 ENCOUNTER — Other Ambulatory Visit: Payer: Self-pay | Admitting: Internal Medicine

## 2022-12-20 DIAGNOSIS — E1121 Type 2 diabetes mellitus with diabetic nephropathy: Secondary | ICD-10-CM

## 2022-12-21 LAB — HM DIABETES EYE EXAM

## 2023-01-01 ENCOUNTER — Ambulatory Visit: Payer: 59 | Admitting: Internal Medicine

## 2023-01-02 ENCOUNTER — Other Ambulatory Visit: Payer: Self-pay | Admitting: Internal Medicine

## 2023-01-02 DIAGNOSIS — E1121 Type 2 diabetes mellitus with diabetic nephropathy: Secondary | ICD-10-CM

## 2023-01-02 DIAGNOSIS — N1832 Chronic kidney disease, stage 3b: Secondary | ICD-10-CM

## 2023-01-20 ENCOUNTER — Other Ambulatory Visit: Payer: Self-pay

## 2023-01-20 ENCOUNTER — Emergency Department (HOSPITAL_COMMUNITY): Payer: 59

## 2023-01-20 ENCOUNTER — Emergency Department (HOSPITAL_COMMUNITY)
Admission: EM | Admit: 2023-01-20 | Discharge: 2023-01-20 | Disposition: A | Discharge status: Home / Self Care | Payer: 59 | Attending: Emergency Medicine | Admitting: Emergency Medicine

## 2023-01-20 DIAGNOSIS — S0083XA Contusion of other part of head, initial encounter: Secondary | ICD-10-CM | POA: Diagnosis present

## 2023-01-20 DIAGNOSIS — Z7982 Long term (current) use of aspirin: Secondary | ICD-10-CM | POA: Insufficient documentation

## 2023-01-20 DIAGNOSIS — Z96642 Presence of left artificial hip joint: Secondary | ICD-10-CM | POA: Insufficient documentation

## 2023-01-20 DIAGNOSIS — I6782 Cerebral ischemia: Secondary | ICD-10-CM | POA: Diagnosis not present

## 2023-01-20 DIAGNOSIS — Z7902 Long term (current) use of antithrombotics/antiplatelets: Secondary | ICD-10-CM | POA: Insufficient documentation

## 2023-01-20 DIAGNOSIS — Z794 Long term (current) use of insulin: Secondary | ICD-10-CM | POA: Insufficient documentation

## 2023-01-20 DIAGNOSIS — I10 Essential (primary) hypertension: Secondary | ICD-10-CM | POA: Insufficient documentation

## 2023-01-20 DIAGNOSIS — W19XXXA Unspecified fall, initial encounter: Secondary | ICD-10-CM | POA: Diagnosis not present

## 2023-01-20 DIAGNOSIS — E162 Hypoglycemia, unspecified: Secondary | ICD-10-CM | POA: Insufficient documentation

## 2023-01-20 DIAGNOSIS — Z79899 Other long term (current) drug therapy: Secondary | ICD-10-CM | POA: Diagnosis not present

## 2023-01-20 DIAGNOSIS — M50322 Other cervical disc degeneration at C5-C6 level: Secondary | ICD-10-CM | POA: Diagnosis not present

## 2023-01-20 DIAGNOSIS — R4182 Altered mental status, unspecified: Secondary | ICD-10-CM | POA: Insufficient documentation

## 2023-01-20 DIAGNOSIS — I700 Atherosclerosis of aorta: Secondary | ICD-10-CM | POA: Insufficient documentation

## 2023-01-20 LAB — CBC WITH DIFFERENTIAL/PLATELET
Abs Immature Granulocytes: 0.01 10*3/uL (ref 0.00–0.07)
Basophils Absolute: 0.1 10*3/uL (ref 0.0–0.1)
Basophils Relative: 1 %
Eosinophils Absolute: 0.1 10*3/uL (ref 0.0–0.5)
Eosinophils Relative: 1 %
HCT: 46.7 % — ABNORMAL HIGH (ref 36.0–46.0)
Hemoglobin: 14.5 g/dL (ref 12.0–15.0)
Immature Granulocytes: 0 %
Lymphocytes Relative: 33 %
Lymphs Abs: 2.2 10*3/uL (ref 0.7–4.0)
MCH: 25.8 pg — ABNORMAL LOW (ref 26.0–34.0)
MCHC: 31 g/dL (ref 30.0–36.0)
MCV: 83.1 fL (ref 80.0–100.0)
Monocytes Absolute: 0.7 10*3/uL (ref 0.1–1.0)
Monocytes Relative: 10 %
Neutro Abs: 3.8 10*3/uL (ref 1.7–7.7)
Neutrophils Relative %: 55 %
Platelets: 209 10*3/uL (ref 150–400)
RBC: 5.62 MIL/uL — ABNORMAL HIGH (ref 3.87–5.11)
RDW: 14.1 % (ref 11.5–15.5)
WBC: 6.8 10*3/uL (ref 4.0–10.5)
nRBC: 0 % (ref 0.0–0.2)

## 2023-01-20 LAB — BRAIN NATRIURETIC PEPTIDE: B Natriuretic Peptide: 27.3 pg/mL (ref 0.0–100.0)

## 2023-01-20 LAB — URINALYSIS, ROUTINE W REFLEX MICROSCOPIC
Bacteria, UA: NONE SEEN
Bilirubin Urine: NEGATIVE
Glucose, UA: >=500 mg/dL — AB
Hgb urine dipstick: NEGATIVE
Ketones, ur: 5 mg/dL — AB
Leukocytes,Ua: NEGATIVE
Nitrite: NEGATIVE
Protein, ur: NEGATIVE mg/dL
Specific Gravity, Urine: 1.013 (ref 1.005–1.030)
pH: 6 (ref 5.0–8.0)

## 2023-01-20 LAB — CBG MONITORING, ED
Glucose-Capillary: 22 mg/dL — CL (ref 70–99)
Glucose-Capillary: 149 mg/dL — ABNORMAL HIGH (ref 70–99)
Glucose-Capillary: 61 mg/dL — ABNORMAL LOW (ref 70–99)
Glucose-Capillary: 82 mg/dL (ref 70–99)

## 2023-01-20 LAB — COMPREHENSIVE METABOLIC PANEL
ALT: 10 U/L (ref 0–44)
AST: 23 U/L (ref 15–41)
Albumin: 3.2 g/dL — ABNORMAL LOW (ref 3.5–5.0)
Alkaline Phosphatase: 59 U/L (ref 38–126)
Anion gap: 11 (ref 5–15)
BUN: 14 mg/dL (ref 8–23)
CO2: 23 mmol/L (ref 22–32)
Calcium: 8.8 mg/dL — ABNORMAL LOW (ref 8.9–10.3)
Chloride: 106 mmol/L (ref 98–111)
Creatinine, Ser: 0.95 mg/dL (ref 0.44–1.00)
GFR, Estimated: >60 mL/min (ref >60)
Glucose, Bld: 174 mg/dL — ABNORMAL HIGH (ref 70–99)
Potassium: 3.5 mmol/L (ref 3.5–5.1)
Sodium: 140 mmol/L (ref 135–145)
Total Bilirubin: 0.7 mg/dL (ref <1.2)
Total Protein: 6.9 g/dL (ref 6.5–8.1)

## 2023-01-20 LAB — I-STAT CHEM 8, ED
BUN: 16 mg/dL (ref 8–23)
Calcium, Ion: 1.08 mmol/L — ABNORMAL LOW (ref 1.15–1.40)
Chloride: 102 mmol/L (ref 98–111)
Creatinine, Ser: 1 mg/dL (ref 0.44–1.00)
Glucose, Bld: 178 mg/dL — ABNORMAL HIGH (ref 70–99)
HCT: 47 % — ABNORMAL HIGH (ref 36.0–46.0)
Hemoglobin: 16 g/dL — ABNORMAL HIGH (ref 12.0–15.0)
Potassium: 3.4 mmol/L — ABNORMAL LOW (ref 3.5–5.1)
Sodium: 142 mmol/L (ref 135–145)
TCO2: 27 mmol/L (ref 22–32)

## 2023-01-20 LAB — APTT: aPTT: 28 s (ref 24–36)

## 2023-01-20 MED ORDER — DEXTROSE 50 % IV SOLN
INTRAVENOUS | Status: AC
  Filled 2023-01-20: qty 50

## 2023-01-20 MED ORDER — METOPROLOL TARTRATE 25 MG PO TABS
12.5000 mg | ORAL_TABLET | Freq: Once | ORAL | Status: AC
  Administered 2023-01-20: 12.5 mg via ORAL
  Filled 2023-01-20: qty 1

## 2023-01-20 MED ORDER — GLUCAGON HCL RDNA (DIAGNOSTIC) 1 MG IJ SOLR
INTRAMUSCULAR | Status: AC
  Filled 2023-01-20: qty 1

## 2023-01-20 NOTE — ED Notes (Signed)
Xray arrived at bedside.

## 2023-01-20 NOTE — Discharge Instructions (Addendum)
You have been seen and discharged from the emergency department.  Your blood sugar drop is most likely due to you not eating dinner last night.  It is extremely important that you continue eating regular meals especially dinner to prevent low blood sugar overnight.  The imaging from today showed no acute injury.  Your blood sugar stabilized.  Follow-up with your regular doctors for reevaluation of your insulin dose.  Follow-up with your primary provider for further evaluation and further care. Take home medications as prescribed. If you have any worsening symptoms or further concerns for your health please return to an emergency department for further evaluation.

## 2023-01-20 NOTE — ED Notes (Signed)
Patient transported to CT 

## 2023-01-20 NOTE — ED Triage Notes (Signed)
Pt BIB by [facility] from home [facility]KW was 10pm last night. Found on the floor this morning at 0900 with hematoma on forehead. A &O x4 at baseline. Pt is on Plavix. Hypoglycemia CBG 42. 192/90 BP

## 2023-01-20 NOTE — ED Provider Notes (Signed)
[facility] EMERGENCY DEPARTMENT AT [facility] Provider Note   CSN: [id number] Arrival date & time: 01/20/23  1216     History  Chief Complaint  Patient presents with   Fall    Hypoglycemic    Michelle Lopez is a 77 y.o. female.  HPI   77 year old diabetic female who is anticoagulated on Plavix presents to the emergency department after presumed fall.  Patient lives with her daughter.  Was last known normal around 10 PM when she went to bed.  Found this morning between the bed and the wall with a hematoma to the forehead, she was altered.  EMS reports that blood sugar was in the 40s.  They were unable to establish an IV line, they attempted oral glucose without success.  On arrival patient was altered, repeat CBG was in the 20s.  Home Medications Prior to Admission medications   Medication Sig Start Date End Date Taking? Authorizing Provider  amoxicillin-clavulanate (AUGMENTIN) 875-125 MG tablet Take 1 tablet by mouth 2 (two) times daily. 11/09/22   Corwin Levins, MD  aspirin 81 MG tablet Take 81 mg by mouth daily.    [provider]  atorvastatin (LIPITOR) 20 MG tablet Take 1 tablet (20 mg total) by mouth daily. 11/09/22   Corwin Levins, MD  Blood Glucose Monitoring Suppl (CONTOUR NEXT EZ) w/Device KIT 1 Act by Does not apply route 2 (two) times daily. 11/08/21   [name], MD  clopidogrel (PLAVIX) 75 MG tablet Take 1 tablet (75 mg total) by mouth daily. 11/09/22   Corwin Levins, MD  dapagliflozin propanediol (FARXIGA) 10 MG TABS tablet Take 1 tablet (10 mg total) by mouth daily. 11/09/22   Corwin Levins, MD  HYDROcodone-acetaminophen (NORCO/VICODIN) 5-325 MG tablet Take 0.5-1 tablets by mouth every 6 (six) hours as needed for moderate pain or severe pain. 10/05/22   Mardella Layman, MD  metoprolol tartrate (LOPRESSOR) 25 MG tablet Take 0.5 tablets (12.5 mg total) by mouth 2 (two) times daily. 11/09/22   Corwin Levins, MD  OneTouch UltraSoft 2 Lancets MISC USE TO  CHECK BLOOD SUGAR 2 TIMES DAILY 04/24/22   [name], MD  tamsulosin (FLOMAX) 0.4 MG CAPS capsule Take 1 capsule (0.4 mg total) by mouth every morning. 11/09/22   Corwin Levins, MD  TOUJEO MAX SOLOSTAR 300 UNIT/ML Solostar Pen Inject 30 Units into the skin daily. 11/06/22   Corwin Levins, MD  TRUEPLUS 5-BEVEL PEN NEEDLES 31G X 6 MM MISC USE TO INJECT insulin UP TO 5 TIMES DAILY 12/20/22   [name], MD      Allergies    Amlodipine, Crestor [rosuvastatin calcium], Morphine, Pravastatin sodium, and Simvastatin    Review of Systems   Review of Systems  Unable to perform ROS: Acuity of condition    Physical Exam Updated Vital Signs BP (!) 202/87   Pulse 66   Resp 14   SpO2 100%  Physical Exam Vitals and nursing note reviewed.  Constitutional:      Appearance: Normal appearance.  HENT:     Head: Normocephalic.     Comments: Mid frontal hematoma    Right Ear: External ear normal.     Left Ear: External ear normal.     Mouth/Throat:     Mouth: Mucous membranes are moist.  Eyes:     Pupils: Pupils are equal, round, and reactive to light.  Cardiovascular:     Rate and Rhythm: Normal rate.  Pulmonary:  Effort: Pulmonary effort is normal. No respiratory distress.  Abdominal:     Palpations: Abdomen is soft.     Comments: No bruising  Musculoskeletal:     Comments: No deformity, pelvis stable  Skin:    General: Skin is warm.  Neurological:     Mental Status: She is alert and oriented to person, place, and time. Mental status is at baseline.  Psychiatric:        Mood and Affect: Mood normal.     ED Results / Procedures / Treatments   Labs (all labs ordered are listed, but only abnormal results are displayed) Labs Reviewed  CBC WITH DIFFERENTIAL/PLATELET - Abnormal; Notable for the following components:      Result Value   RBC 5.62 (*)    HCT 46.7 (*)    MCH 25.8 (*)    All other components within normal limits  COMPREHENSIVE METABOLIC PANEL - Abnormal;  Notable for the following components:   Glucose, Bld 174 (*)    Calcium 8.8 (*)    Albumin 3.2 (*)    All other components within normal limits  URINALYSIS, ROUTINE W REFLEX MICROSCOPIC - Abnormal; Notable for the following components:   Glucose, UA >=500 (*)    Ketones, ur 5 (*)    All other components within normal limits  CBG MONITORING, ED - Abnormal; Notable for the following components:   Glucose-Capillary 22 (*)    All other components within normal limits  CBG MONITORING, ED - Abnormal; Notable for the following components:   Glucose-Capillary 149 (*)    All other components within normal limits  I-STAT CHEM 8, ED - Abnormal; Notable for the following components:   Potassium 3.4 (*)    Glucose, Bld 178 (*)    Calcium, Ion 1.08 (*)    Hemoglobin 16.0 (*)    HCT 47.0 (*)    All other components within normal limits  CBG MONITORING, ED - Abnormal; Notable for the following components:   Glucose-Capillary 61 (*)    All other components within normal limits  APTT  BRAIN NATRIURETIC PEPTIDE  CBG MONITORING, ED    EKG EKG Interpretation Date/Time:  Saturday January 20 2023 12:29:02 EST Ventricular Rate:  86 PR Interval:  192 QRS Duration:  93 QT Interval:  345 QTC Calculation: 413 R Axis:   6  Text Interpretation: Sinus rhythm Anterior infarct, old Confirmed by Kristine Royal [id number] on 01/20/2023 3:44:38 PM  Radiology CT Cervical Spine Wo Contrast Result Date: 01/20/2023 CLINICAL DATA:  Polytrauma, blunt EXAM: CT CERVICAL SPINE WITHOUT CONTRAST TECHNIQUE: Multidetector CT imaging of the cervical spine was performed without intravenous contrast. Multiplanar CT image reconstructions were also generated. RADIATION DOSE REDUCTION: This exam was performed according to the departmental dose-optimization program which includes automated exposure control, adjustment of the mA and/or kV according to patient size and/or use of iterative reconstruction technique. COMPARISON:   None Available. FINDINGS: Alignment: Facet joints are aligned without dislocation or traumatic listhesis. Dens and lateral masses are aligned. Straightening and slight reversal of the cervical lordosis. Skull base and vertebrae: No acute fracture. No primary bone lesion or focal pathologic process. Soft tissues and spinal canal: No prevertebral fluid or swelling. No visible canal hematoma. Disc levels: Degenerative disc disease is most pronounced at C5-6. Multilevel bilateral facet arthropathy. Upper chest: Negative. Other: Bilateral carotid atherosclerosis. IMPRESSION: No acute fracture or traumatic listhesis of the cervical spine. Electronically Signed   By: Duanne Guess D.O.   On: 01/20/2023 13:20  CT Head Wo Contrast Result Date: 01/20/2023 CLINICAL DATA:  Polytrauma, blunt EXAM: CT HEAD WITHOUT CONTRAST TECHNIQUE: Contiguous axial images were obtained from the base of the skull through the vertex without intravenous contrast. RADIATION DOSE REDUCTION: This exam was performed according to the departmental dose-optimization program which includes automated exposure control, adjustment of the mA and/or kV according to patient size and/or use of iterative reconstruction technique. COMPARISON:  03/18/2013, 07/05/2014 FINDINGS: Brain: No evidence of acute infarction, hemorrhage, hydrocephalus, extra-axial collection or mass lesion/mass effect. Extensive low-density changes within the periventricular and subcortical white matter most compatible with chronic microvascular ischemic change. Mild-moderate diffuse cerebral volume loss. Vascular: Atherosclerotic calcifications involving the large vessels of the skull base. No unexpected hyperdense vessel. Skull: Normal. Negative for fracture or focal lesion. Sinuses/Orbits: Small amount of layering fluid in the left maxillary sinus. Other: Mild focal soft tissue swelling of the anterior right frontal scalp. IMPRESSION: 1. No acute intracranial findings. 2. Mild  focal soft tissue swelling of the anterior right frontal scalp. No underlying calvarial fracture. 3. Small amount of layering fluid in the left maxillary sinus. Correlate for acute sinusitis. Electronically Signed   By: Duanne Guess D.O.   On: 01/20/2023 13:16   DG Pelvis Portable Result Date: 01/20/2023 CLINICAL DATA:  Fall EXAM: PORTABLE PELVIS 1-2 VIEWS COMPARISON:  None Available. FINDINGS: There is no evidence of pelvic fracture or diastasis. Prior left total hip arthroplasty. Severe degenerative changes of the right hip. Advanced atherosclerotic vascular calcifications. IMPRESSION: Negative. Electronically Signed   By: Duanne Guess D.O.   On: 01/20/2023 13:13   DG Chest Port 1 View Result Date: 01/20/2023 CLINICAL DATA:  Fall EXAM: PORTABLE CHEST 1 VIEW COMPARISON:  05/02/2021 FINDINGS: The heart size and mediastinal contours are within normal limits. Aortic atherosclerosis. Elevation of the right hemidiaphragm. No focal airspace consolidation, pleural effusion, or pneumothorax. The visualized skeletal structures are unremarkable. IMPRESSION: No active disease. Electronically Signed   By: Duanne Guess D.O.   On: 01/20/2023 13:12    Procedures .Critical Care  Performed by: Rozelle Logan, DO Authorized by: Rozelle Logan, DO   Critical care provider statement:    Critical care time (minutes):  30   Critical care time was exclusive of:  Separately billable procedures and treating other patients   Critical care was necessary to treat or prevent imminent or life-threatening deterioration of the following conditions:  Endocrine crisis and trauma   Critical care was time spent personally by me on the following activities:  Development of treatment plan with patient or surrogate, discussions with consultants, evaluation of patient's response to treatment, examination of patient, ordering and review of laboratory studies, ordering and review of radiographic studies, ordering and  performing treatments and interventions, pulse oximetry, re-evaluation of patient's condition and review of old charts   I assumed direction of critical care for this patient from another provider in my specialty: no     Care discussed with: admitting provider       Medications Ordered in ED Medications  glucagon (human recombinant) (GLUCAGEN) 1 MG injection (has no administration in time range)  dextrose 50 % solution (has no administration in time range)    ED Course/ Medical Decision Making/ A&P                                 Medical Decision Making Amount and/or Complexity of Data Reviewed Labs: ordered. Radiology: ordered.  77 year old female presents emergency department after being found between the bed and the wall, presumed fall.  She was altered, blood sugar was in the 40s for EMS, it was in the 20s on arrival.  Emergent IV access was obtained and D50 was given.  Patient had immediate improvement of mentation, was ANO x 3.  She has a small hematoma to the frontal forehead.  Portable chest and pelvis are unremarkable.  CT of the head and neck showed no acute finding.  Blood work shows that patient's blood sugar has been stable.  On reevaluation the patient admits to not eating dinner before going to bed last night.  This most likely explains the drop in blood sugar.  I explained the importance of regular meals especially dinner.  She understands.  The daughter who lives with her was on the phone during this discussion.  Also of note the patient became hypertensive but did not take her morning medications, her HTN medications were ordered.  Given the degree of hypoglycemia I offered admission.  However the patient is adamant that she wants to go home.  I have agreed to let her eat and drink and we will repeat a CBG.  If this is stable and she is baseline I would consider discharge.  Daughter and other daughter on the phone present for this conversation.  Patient signed out  pending p.o. and repeat CBG.        Final Clinical Impression(s) / ED Diagnoses Final diagnoses:  None    Rx / DC Orders ED Discharge Orders     None         Rozelle Logan, DO 01/20/23 1556

## 2023-01-20 NOTE — ED Notes (Addendum)
Pt BIB by [facility] from home [facility]KW was 10pm last night. Found on the floor this morning at 0900. A &O x4 at baseline. Plavix. Hypoglycemia CBG 42. 192/90 BP

## 2023-01-20 NOTE — ED Provider Notes (Signed)
Patient seen after prior ED provider.  She is improved.  She is taking good p.o.  Her blood glucose is improved.  Patient offered additional observation.  She declined same.  She is emphatic about her interest in going home.  Importance of close follow-up stressed.  Strict return precautions given and understood.   Wynetta Fines, MD 01/20/23 [phone]

## 2023-01-22 ENCOUNTER — Other Ambulatory Visit: Payer: Self-pay | Admitting: Internal Medicine

## 2023-01-22 ENCOUNTER — Other Ambulatory Visit: Payer: Self-pay

## 2023-01-22 DIAGNOSIS — N1832 Chronic kidney disease, stage 3b: Secondary | ICD-10-CM

## 2023-01-22 DIAGNOSIS — E1121 Type 2 diabetes mellitus with diabetic nephropathy: Secondary | ICD-10-CM

## 2023-01-23 ENCOUNTER — Ambulatory Visit (INDEPENDENT_AMBULATORY_CARE_PROVIDER_SITE_OTHER): Payer: 59 | Admitting: Internal Medicine

## 2023-01-23 ENCOUNTER — Other Ambulatory Visit: Payer: Self-pay | Admitting: Internal Medicine

## 2023-01-23 VITALS — BP 142/68 | HR 59 | Temp 97.6°F | Resp 16 | Ht 60.0 in | Wt 130.0 lb

## 2023-01-23 DIAGNOSIS — E160 Drug-induced hypoglycemia without coma: Secondary | ICD-10-CM | POA: Diagnosis not present

## 2023-01-23 DIAGNOSIS — E1121 Type 2 diabetes mellitus with diabetic nephropathy: Secondary | ICD-10-CM

## 2023-01-23 DIAGNOSIS — I6300 Cerebral infarction due to thrombosis of unspecified precerebral artery: Secondary | ICD-10-CM

## 2023-01-23 DIAGNOSIS — N1832 Chronic kidney disease, stage 3b: Secondary | ICD-10-CM | POA: Diagnosis not present

## 2023-01-23 DIAGNOSIS — E538 Deficiency of other specified B group vitamins: Secondary | ICD-10-CM

## 2023-01-23 DIAGNOSIS — E119 Type 2 diabetes mellitus without complications: Secondary | ICD-10-CM

## 2023-01-23 DIAGNOSIS — Z23 Encounter for immunization: Secondary | ICD-10-CM

## 2023-01-23 DIAGNOSIS — E441 Mild protein-calorie malnutrition: Secondary | ICD-10-CM

## 2023-01-23 DIAGNOSIS — T383X5A Adverse effect of insulin and oral hypoglycemic [antidiabetic] drugs, initial encounter: Secondary | ICD-10-CM

## 2023-01-23 DIAGNOSIS — Z794 Long term (current) use of insulin: Secondary | ICD-10-CM

## 2023-01-23 MED ORDER — TOUJEO MAX SOLOSTAR 300 UNIT/ML ~~LOC~~ SOPN: 20.0000 [IU] | PEN_INJECTOR | Freq: Every day | SUBCUTANEOUS | 0 refills | Status: DC

## 2023-01-23 MED ORDER — DEXCOM G7 SENSOR MISC: 1.0000 | Freq: Every day | 1 refills | Status: DC

## 2023-01-23 MED ORDER — GVOKE HYPOPEN 2-PACK 1 MG/0.2ML ~~LOC~~ SOAJ: 1.0000 | Freq: Every day | SUBCUTANEOUS | 5 refills | Status: DC | PRN

## 2023-01-23 MED ORDER — DEXCOM G7 RECEIVER DEVI: 1.0000 | Freq: Every day | 1 refills | Status: DC

## 2023-01-23 MED ORDER — BOOSTRIX 5-2.5-18.5 LF-MCG/0.5 IM SUSP: 0.5000 mL | Freq: Once | INTRAMUSCULAR | 0 refills | Status: AC

## 2023-01-23 MED ORDER — CYANOCOBALAMIN 1000 MCG/ML IJ SOLN
1000.0000 ug | Freq: Once | INTRAMUSCULAR | Status: AC
  Administered 2023-01-23: 1000 ug via INTRAMUSCULAR

## 2023-01-23 MED ORDER — GVOKE HYPOPEN 2-PACK 1 MG/0.2ML ~~LOC~~ SOAJ: 1.0000 | Freq: Every day | SUBCUTANEOUS | 5 refills | Status: AC | PRN

## 2023-01-23 NOTE — Progress Notes (Deleted)
Subjective:  Patient ID: Michelle Lopez, female    DOB: May 25, 1945  Age: 77 y.o. MRN: [id number]  CC: No chief complaint on file.   HPI Michelle Lopez presents for ***  Outpatient Medications Prior to Visit  Medication Sig Dispense Refill   aspirin 81 MG tablet Take 81 mg by mouth daily.     atorvastatin (LIPITOR) 20 MG tablet Take 1 tablet (20 mg total) by mouth daily. 90 tablet 0   Blood Glucose Monitoring Suppl (CONTOUR NEXT EZ) w/Device KIT 1 Act by Does not apply route 2 (two) times daily. 1 kit 3   clopidogrel (PLAVIX) 75 MG tablet Take 1 tablet (75 mg total) by mouth daily. 90 tablet 0   dapagliflozin propanediol (FARXIGA) 10 MG TABS tablet TAKE 1 TABLET BY MOUTH EVERY DAY 90 tablet 0   HYDROcodone-acetaminophen (NORCO/VICODIN) 5-325 MG tablet Take 0.5-1 tablets by mouth every 6 (six) hours as needed for moderate pain or severe pain. 10 tablet 0   metoprolol tartrate (LOPRESSOR) 25 MG tablet Take 0.5 tablets (12.5 mg total) by mouth 2 (two) times daily. 90 tablet 0   OneTouch UltraSoft 2 Lancets MISC USE TO CHECK BLOOD SUGAR 2 TIMES DAILY 300 each 1   tamsulosin (FLOMAX) 0.4 MG CAPS capsule Take 1 capsule (0.4 mg total) by mouth every morning. 90 capsule 0   TOUJEO MAX SOLOSTAR 300 UNIT/ML Solostar Pen Inject 30 Units into the skin daily. 6 mL 3   TRUEPLUS 5-BEVEL PEN NEEDLES 31G X 6 MM MISC USE TO INJECT insulin UP TO 5 TIMES DAILY 300 each 1   amoxicillin-clavulanate (AUGMENTIN) 875-125 MG tablet Take 1 tablet by mouth 2 (two) times daily. (Patient not taking: Reported on 01/23/2023) 20 tablet 0   No facility-administered medications prior to visit.    ROS Review of Systems  Objective:  BP (!) 142/68 (BP Location: Left Arm, Patient Position: Sitting, Cuff Size: Large)   Pulse (!) 59   Temp 97.6 F (36.4 C) (Temporal)   Ht 5' (1.524 m)   Wt 130 lb (59 kg)   SpO2 94%   BMI 25.39 kg/m   BP Readings from Last 3 Encounters:  01/23/23 (!) 142/68  01/20/23 (!) 202/87   11/09/22 122/68    Wt Readings from Last 3 Encounters:  01/23/23 130 lb (59 kg)  01/20/23 128 lb 15.5 oz (58.5 kg)  11/09/22 129 lb (58.5 kg)    Physical Exam Cardiovascular:     Rate and Rhythm: Normal rate and regular rhythm.     Heart sounds: Murmur heard.     Systolic murmur is present with a grade of 1/6.     No diastolic murmur is present.  Pulmonary:     Effort: Pulmonary effort is normal.     Breath sounds: Examination of the right-lower field reveals rales. Examination of the left-lower field reveals rales. Rales present. No decreased breath sounds, wheezing or rhonchi.  Musculoskeletal:     Right lower leg: No edema.     Left lower leg: No edema.     Lab Results  Component Value Date   WBC 6.8 01/20/2023   HGB 14.5 01/20/2023   HCT 46.7 (H) 01/20/2023   PLT 209 01/20/2023   GLUCOSE 174 (H) 01/20/2023   CHOL 179 06/01/2022   TRIG 190.0 (H) 06/01/2022   HDL 37.20 (L) 06/01/2022   LDLDIRECT 100.0 07/06/2015   LDLCALC 104 (H) 06/01/2022   ALT 10 01/20/2023   AST 23 01/20/2023   NA 140 01/20/2023  K 3.5 01/20/2023   CL 106 01/20/2023   CREATININE 0.95 01/20/2023   BUN 14 01/20/2023   CO2 23 01/20/2023   TSH 1.44 02/22/2022   INR 0.9 04/03/2019   HGBA1C 8.1 (H) 06/01/2022   MICROALBUR 0.8 06/01/2022    CT Cervical Spine Wo Contrast Result Date: 01/20/2023 CLINICAL DATA:  Polytrauma, blunt EXAM: CT CERVICAL SPINE WITHOUT CONTRAST TECHNIQUE: Multidetector CT imaging of the cervical spine was performed without intravenous contrast. Multiplanar CT image reconstructions were also generated. RADIATION DOSE REDUCTION: This exam was performed according to the departmental dose-optimization program which includes automated exposure control, adjustment of the mA and/or kV according to patient size and/or use of iterative reconstruction technique. COMPARISON:  None Available. FINDINGS: Alignment: Facet joints are aligned without dislocation or traumatic listhesis. Dens  and lateral masses are aligned. Straightening and slight reversal of the cervical lordosis. Skull base and vertebrae: No acute fracture. No primary bone lesion or focal pathologic process. Soft tissues and spinal canal: No prevertebral fluid or swelling. No visible canal hematoma. Disc levels: Degenerative disc disease is most pronounced at C5-6. Multilevel bilateral facet arthropathy. Upper chest: Negative. Other: Bilateral carotid atherosclerosis. IMPRESSION: No acute fracture or traumatic listhesis of the cervical spine. Electronically Signed   By: Duanne Guess D.O.   On: 01/20/2023 13:20   CT Head Wo Contrast Result Date: 01/20/2023 CLINICAL DATA:  Polytrauma, blunt EXAM: CT HEAD WITHOUT CONTRAST TECHNIQUE: Contiguous axial images were obtained from the base of the skull through the vertex without intravenous contrast. RADIATION DOSE REDUCTION: This exam was performed according to the departmental dose-optimization program which includes automated exposure control, adjustment of the mA and/or kV according to patient size and/or use of iterative reconstruction technique. COMPARISON:  03/18/2013, 07/05/2014 FINDINGS: Brain: No evidence of acute infarction, hemorrhage, hydrocephalus, extra-axial collection or mass lesion/mass effect. Extensive low-density changes within the periventricular and subcortical white matter most compatible with chronic microvascular ischemic change. Mild-moderate diffuse cerebral volume loss. Vascular: Atherosclerotic calcifications involving the large vessels of the skull base. No unexpected hyperdense vessel. Skull: Normal. Negative for fracture or focal lesion. Sinuses/Orbits: Small amount of layering fluid in the left maxillary sinus. Other: Mild focal soft tissue swelling of the anterior right frontal scalp. IMPRESSION: 1. No acute intracranial findings. 2. Mild focal soft tissue swelling of the anterior right frontal scalp. No underlying calvarial fracture. 3. Small amount  of layering fluid in the left maxillary sinus. Correlate for acute sinusitis. Electronically Signed   By: Duanne Guess D.O.   On: 01/20/2023 13:16   DG Pelvis Portable Result Date: 01/20/2023 CLINICAL DATA:  Fall EXAM: PORTABLE PELVIS 1-2 VIEWS COMPARISON:  None Available. FINDINGS: There is no evidence of pelvic fracture or diastasis. Prior left total hip arthroplasty. Severe degenerative changes of the right hip. Advanced atherosclerotic vascular calcifications. IMPRESSION: Negative. Electronically Signed   By: Duanne Guess D.O.   On: 01/20/2023 13:13   DG Chest Port 1 View Result Date: 01/20/2023 CLINICAL DATA:  Fall EXAM: PORTABLE CHEST 1 VIEW COMPARISON:  05/02/2021 FINDINGS: The heart size and mediastinal contours are within normal limits. Aortic atherosclerosis. Elevation of the right hemidiaphragm. No focal airspace consolidation, pleural effusion, or pneumothorax. The visualized skeletal structures are unremarkable. IMPRESSION: No active disease. Electronically Signed   By: Duanne Guess D.O.   On: 01/20/2023 13:12    Assessment & Plan:  There are no diagnoses linked to this encounter.   Follow-up: No follow-ups on file.  Sanda Linger, MD

## 2023-01-23 NOTE — Progress Notes (Unsigned)
Subjective:  Patient ID: Michelle Lopez, female    DOB: [birth year]  Age: 77 y.o. MRN: [id number]  CC: No chief complaint on file.   HPI Michelle Lopez presents for ***  Outpatient Medications Prior to Visit  Medication Sig Dispense Refill   aspirin 81 MG tablet Take 81 mg by mouth daily.     atorvastatin (LIPITOR) 20 MG tablet Take 1 tablet (20 mg total) by mouth daily. 90 tablet 0   Blood Glucose Monitoring Suppl (CONTOUR NEXT EZ) w/Device KIT 1 Act by Does not apply route 2 (two) times daily. 1 kit 3   clopidogrel (PLAVIX) 75 MG tablet Take 1 tablet (75 mg total) by mouth daily. 90 tablet 0   dapagliflozin propanediol (FARXIGA) 10 MG TABS tablet TAKE 1 TABLET BY MOUTH EVERY DAY 90 tablet 0   HYDROcodone-acetaminophen (NORCO/VICODIN) 5-325 MG tablet Take 0.5-1 tablets by mouth every 6 (six) hours as needed for moderate pain or severe pain. 10 tablet 0   metoprolol tartrate (LOPRESSOR) 25 MG tablet Take 0.5 tablets (12.5 mg total) by mouth 2 (two) times daily. 90 tablet 0   OneTouch UltraSoft 2 Lancets MISC USE TO CHECK BLOOD SUGAR 2 TIMES DAILY 300 each 1   tamsulosin (FLOMAX) 0.4 MG CAPS capsule Take 1 capsule (0.4 mg total) by mouth every morning. 90 capsule 0   TRUEPLUS 5-BEVEL PEN NEEDLES 31G X 6 MM MISC USE TO INJECT insulin UP TO 5 TIMES DAILY 300 each 1   TOUJEO MAX SOLOSTAR 300 UNIT/ML Solostar Pen Inject 30 Units into the skin daily. 6 mL 3   No facility-administered medications prior to visit.    ROS Review of Systems  Objective:  There were no vitals taken for this visit.  BP Readings from Last 3 Encounters:  01/23/23 (!) 142/68  01/20/23 (!) 202/87  11/09/22 122/68    Wt Readings from Last 3 Encounters:  01/23/23 130 lb (59 kg)  01/20/23 128 lb 15.5 oz (58.5 kg)  11/09/22 129 lb (58.5 kg)    Physical Exam  Lab Results  Component Value Date   WBC 6.8 01/20/2023   HGB 14.5 01/20/2023   HCT 46.7 (H) 01/20/2023   PLT 209 01/20/2023   GLUCOSE 174 (H)  01/20/2023   CHOL 179 06/01/2022   TRIG 190.0 (H) 06/01/2022   HDL 37.20 (L) 06/01/2022   LDLDIRECT 100.0 07/06/2015   LDLCALC 104 (H) 06/01/2022   ALT 10 01/20/2023   AST 23 01/20/2023   NA 140 01/20/2023   K 3.5 01/20/2023   CL 106 01/20/2023   CREATININE 0.95 01/20/2023   BUN 14 01/20/2023   CO2 23 01/20/2023   TSH 1.44 02/22/2022   INR 0.9 04/03/2019   HGBA1C 8.1 (H) 06/01/2022   MICROALBUR 0.8 06/01/2022    CT Cervical Spine Wo Contrast Result Date: 01/20/2023 CLINICAL DATA:  Polytrauma, blunt EXAM: CT CERVICAL SPINE WITHOUT CONTRAST TECHNIQUE: Multidetector CT imaging of the cervical spine was performed without intravenous contrast. Multiplanar CT image reconstructions were also generated. RADIATION DOSE REDUCTION: This exam was performed according to the departmental dose-optimization program which includes automated exposure control, adjustment of the mA and/or kV according to patient size and/or use of iterative reconstruction technique. COMPARISON:  None Available. FINDINGS: Alignment: Facet joints are aligned without dislocation or traumatic listhesis. Dens and lateral masses are aligned. Straightening and slight reversal of the cervical lordosis. Skull base and vertebrae: No acute fracture. No primary bone lesion or focal pathologic process. Soft tissues and spinal canal: No  prevertebral fluid or swelling. No visible canal hematoma. Disc levels: Degenerative disc disease is most pronounced at C5-6. Multilevel bilateral facet arthropathy. Upper chest: Negative. Other: Bilateral carotid atherosclerosis. IMPRESSION: No acute fracture or traumatic listhesis of the cervical spine. Electronically Signed   By: Duanne Guess D.O.   On: 01/20/2023 13:20   CT Head Wo Contrast Result Date: 01/20/2023 CLINICAL DATA:  Polytrauma, blunt EXAM: CT HEAD WITHOUT CONTRAST TECHNIQUE: Contiguous axial images were obtained from the base of the skull through the vertex without intravenous contrast.  RADIATION DOSE REDUCTION: This exam was performed according to the departmental dose-optimization program which includes automated exposure control, adjustment of the mA and/or kV according to patient size and/or use of iterative reconstruction technique. COMPARISON:  03/18/2013, 07/05/2014 FINDINGS: Brain: No evidence of acute infarction, hemorrhage, hydrocephalus, extra-axial collection or mass lesion/mass effect. Extensive low-density changes within the periventricular and subcortical white matter most compatible with chronic microvascular ischemic change. Mild-moderate diffuse cerebral volume loss. Vascular: Atherosclerotic calcifications involving the large vessels of the skull base. No unexpected hyperdense vessel. Skull: Normal. Negative for fracture or focal lesion. Sinuses/Orbits: Small amount of layering fluid in the left maxillary sinus. Other: Mild focal soft tissue swelling of the anterior right frontal scalp. IMPRESSION: 1. No acute intracranial findings. 2. Mild focal soft tissue swelling of the anterior right frontal scalp. No underlying calvarial fracture. 3. Small amount of layering fluid in the left maxillary sinus. Correlate for acute sinusitis. Electronically Signed   By: Duanne Guess D.O.   On: 01/20/2023 13:16   DG Pelvis Portable Result Date: 01/20/2023 CLINICAL DATA:  Fall EXAM: PORTABLE PELVIS 1-2 VIEWS COMPARISON:  None Available. FINDINGS: There is no evidence of pelvic fracture or diastasis. Prior left total hip arthroplasty. Severe degenerative changes of the right hip. Advanced atherosclerotic vascular calcifications. IMPRESSION: Negative. Electronically Signed   By: Duanne Guess D.O.   On: 01/20/2023 13:13   DG Chest Port 1 View Result Date: 01/20/2023 CLINICAL DATA:  Fall EXAM: PORTABLE CHEST 1 VIEW COMPARISON:  05/02/2021 FINDINGS: The heart size and mediastinal contours are within normal limits. Aortic atherosclerosis. Elevation of the right hemidiaphragm. No focal  airspace consolidation, pleural effusion, or pneumothorax. The visualized skeletal structures are unremarkable. IMPRESSION: No active disease. Electronically Signed   By: Duanne Guess D.O.   On: 01/20/2023 13:12    Assessment & Plan:  Cerebrovascular accident (CVA) due to thrombosis of precerebral artery (HCC) -     For home use only DME Hospital bed  Type 2 diabetes mellitus with diabetic nephropathy, without long-term current use of insulin (HCC) -     Toujeo Max SoloStar; Inject 20 Units into the skin daily.  Dispense: 6 mL; Refill: 0  Need for prophylactic vaccination with combined diphtheria-tetanus-pertussis (DTP) vaccine -     Boostrix; Inject 0.5 mLs into the muscle once for 1 dose.  Dispense: 0.5 mL; Refill: 0  Hypoglycemia due to insulin -     Dexcom G7 Sensor; 1 Act by Does not apply route daily.  Dispense: 9 each; Refill: 1 -     Dexcom G7 Receiver; 1 Act by Does not apply route daily.  Dispense: 9 each; Refill: 1 -     Gvoke HypoPen 2-Pack; Inject 1 Act into the skin daily as needed.  Dispense: 2 mL; Refill: 5  Insulin-requiring or dependent type II diabetes mellitus (HCC) -     Dexcom G7 Sensor; 1 Act by Does not apply route daily.  Dispense: 9 each; Refill: 1 -  Dexcom G7 Receiver; 1 Act by Does not apply route daily.  Dispense: 9 each; Refill: 1 -     Gvoke HypoPen 2-Pack; Inject 1 Act into the skin daily as needed.  Dispense: 2 mL; Refill: 5     Follow-up: No follow-ups on file.  Sanda Linger, MD

## 2023-01-23 NOTE — Progress Notes (Unsigned)
Subjective:  Patient ID: Michelle Lopez, female    DOB: 05-11-1945  Age: 77 y.o. MRN: [id number]  CC: Hypertension and Hyperlipidemia   HPI Michelle Lopez presents for f/up ----  Discussed the use of AI scribe software for clinical note transcription with the patient, who gave verbal consent to proceed.  History of Present Illness   The patient, with a history of diabetes, presented with a recent episode of hypoglycemia. She reported a blood sugar level of 26, which led to hospitalization. The patient's insulin dose was questioned by the hospital pharmacist, who suggested that the current dose of 30 units might be too high. The patient's caregiver reported that she was following previous instructions and had not adjusted the insulin dose in response to low blood sugar levels. When hypoglycemia occurred, the caregiver treated it with orange juice.  The patient also had an incident where she crawled under a bed and had difficulty getting out due to the metal frame. It was suggested that she might have hit her head during this incident, but no pain or other symptoms were reported.  The patient had not received a flu shot for the current year, and there was interest in receiving one. The patient's caregiver expressed interest in updating the patient's tetanus shot.  The patient had a recent fall resulting in a foot injury with three broken bones. She was awaiting further care for this injury. The patient's caregiver also reported concerns about the patient's urinary incontinence and requested updated supplies.  The patient's caregiver also reported that the patient had been experiencing increased urinary frequency, waking up three times a night to urinate. This was causing significant frustration for the patient.  The patient's caregiver also expressed a need for a hospital bed and grab bars for the shower to improve the patient's safety at home. She was also interested in a continuous glucose  monitor for better management of the patient's diabetes.       Outpatient Medications Prior to Visit  Medication Sig Dispense Refill   aspirin 81 MG tablet Take 81 mg by mouth daily.     atorvastatin (LIPITOR) 20 MG tablet Take 1 tablet (20 mg total) by mouth daily. 90 tablet 0   Blood Glucose Monitoring Suppl (CONTOUR NEXT EZ) w/Device KIT 1 Act by Does not apply route 2 (two) times daily. 1 kit 3   clopidogrel (PLAVIX) 75 MG tablet Take 1 tablet (75 mg total) by mouth daily. 90 tablet 0   dapagliflozin propanediol (FARXIGA) 10 MG TABS tablet TAKE 1 TABLET BY MOUTH EVERY DAY 90 tablet 0   HYDROcodone-acetaminophen (NORCO/VICODIN) 5-325 MG tablet Take 0.5-1 tablets by mouth every 6 (six) hours as needed for moderate pain or severe pain. 10 tablet 0   metoprolol tartrate (LOPRESSOR) 25 MG tablet Take 0.5 tablets (12.5 mg total) by mouth 2 (two) times daily. 90 tablet 0   OneTouch UltraSoft 2 Lancets MISC USE TO CHECK BLOOD SUGAR 2 TIMES DAILY 300 each 1   tamsulosin (FLOMAX) 0.4 MG CAPS capsule Take 1 capsule (0.4 mg total) by mouth every morning. 90 capsule 0   TRUEPLUS 5-BEVEL PEN NEEDLES 31G X 6 MM MISC USE TO INJECT insulin UP TO 5 TIMES DAILY 300 each 1   TOUJEO MAX SOLOSTAR 300 UNIT/ML Solostar Pen Inject 30 Units into the skin daily. 6 mL 3   amoxicillin-clavulanate (AUGMENTIN) 875-125 MG tablet Take 1 tablet by mouth 2 (two) times daily. (Patient not taking: Reported on 01/23/2023) 20 tablet 0  No facility-administered medications prior to visit.    ROS Review of Systems  Constitutional:  Positive for fatigue. Negative for appetite change, chills and diaphoresis.  HENT: Negative.    Respiratory: Negative.  Negative for cough, chest tightness, shortness of breath and wheezing.   Cardiovascular:  Negative for chest pain, palpitations and leg swelling.  Gastrointestinal:  Negative for abdominal pain, constipation, diarrhea and nausea.  Endocrine: Positive for polyuria.   Genitourinary:  Positive for frequency. Negative for difficulty urinating and dysuria.  Musculoskeletal:  Positive for gait problem. Negative for myalgias.  Skin: Negative.   Psychiatric/Behavioral:  Positive for behavioral problems, confusion and decreased concentration. Negative for sleep disturbance. The patient is not nervous/anxious.     Objective:  BP (!) 142/68 (BP Location: Left Arm, Patient Position: Sitting, Cuff Size: Large)   Pulse (!) 59   Temp 97.6 F (36.4 C) (Temporal)   Ht 5' (1.524 m)   Wt 130 lb (59 kg)   SpO2 94%   BMI 25.39 kg/m   BP Readings from Last 3 Encounters:  01/23/23 (!) 142/68  01/20/23 (!) 202/87  11/09/22 122/68    Wt Readings from Last 3 Encounters:  01/23/23 130 lb (59 kg)  01/20/23 128 lb 15.5 oz (58.5 kg)  11/09/22 129 lb (58.5 kg)    Physical Exam Vitals reviewed.  Constitutional:      General: She is not in acute distress.    Appearance: She is ill-appearing. She is not toxic-appearing or diaphoretic.  HENT:     Nose: Nose normal.     Mouth/Throat:     Mouth: Mucous membranes are moist.  Eyes:     General: No scleral icterus.    Conjunctiva/sclera: Conjunctivae normal.  Cardiovascular:     Rate and Rhythm: Normal rate and regular rhythm.     Heart sounds: Murmur heard.     No friction rub. No gallop.  Pulmonary:     Effort: Pulmonary effort is normal.     Breath sounds: No stridor. No wheezing, rhonchi or rales.  Abdominal:     General: Abdomen is flat.     Palpations: There is no mass.     Tenderness: There is no abdominal tenderness. There is no guarding.     Hernia: No hernia is present.  Musculoskeletal:        General: Normal range of motion.     Cervical back: Neck supple.     Right lower leg: No edema.     Left lower leg: No edema.  Lymphadenopathy:     Cervical: No cervical adenopathy.  Skin:    General: Skin is warm and dry.  Neurological:     Mental Status: She is alert. Mental status is at baseline.   Psychiatric:        Attention and Perception: Perception normal. She is inattentive.        Mood and Affect: Affect is flat.        Speech: She is noncommunicative. Speech is delayed.        Behavior: Behavior normal. Behavior is cooperative.        Thought Content: Thought content normal. Thought content is not paranoid or delusional. Thought content does not include homicidal or suicidal ideation.        Cognition and Memory: Cognition is impaired. Memory is impaired.     Lab Results  Component Value Date   WBC 6.8 01/20/2023   HGB 14.5 01/20/2023   HCT 46.7 (H) 01/20/2023   PLT  209 01/20/2023   GLUCOSE 174 (H) 01/20/2023   CHOL 179 06/01/2022   TRIG 190.0 (H) 06/01/2022   HDL 37.20 (L) 06/01/2022   LDLDIRECT 100.0 07/06/2015   LDLCALC 104 (H) 06/01/2022   ALT 10 01/20/2023   AST 23 01/20/2023   NA 140 01/20/2023   K 3.5 01/20/2023   CL 106 01/20/2023   CREATININE 0.95 01/20/2023   BUN 14 01/20/2023   CO2 23 01/20/2023   TSH 1.44 02/22/2022   INR 0.9 04/03/2019   HGBA1C 8.1 (H) 06/01/2022   MICROALBUR 0.8 06/01/2022    CT Cervical Spine Wo Contrast Result Date: 01/20/2023 CLINICAL DATA:  Polytrauma, blunt EXAM: CT CERVICAL SPINE WITHOUT CONTRAST TECHNIQUE: Multidetector CT imaging of the cervical spine was performed without intravenous contrast. Multiplanar CT image reconstructions were also generated. RADIATION DOSE REDUCTION: This exam was performed according to the departmental dose-optimization program which includes automated exposure control, adjustment of the mA and/or kV according to patient size and/or use of iterative reconstruction technique. COMPARISON:  None Available. FINDINGS: Alignment: Facet joints are aligned without dislocation or traumatic listhesis. Dens and lateral masses are aligned. Straightening and slight reversal of the cervical lordosis. Skull base and vertebrae: No acute fracture. No primary bone lesion or focal pathologic process. Soft tissues  and spinal canal: No prevertebral fluid or swelling. No visible canal hematoma. Disc levels: Degenerative disc disease is most pronounced at C5-6. Multilevel bilateral facet arthropathy. Upper chest: Negative. Other: Bilateral carotid atherosclerosis. IMPRESSION: No acute fracture or traumatic listhesis of the cervical spine. Electronically Signed   By: Duanne Guess D.O.   On: 01/20/2023 13:20   CT Head Wo Contrast Result Date: 01/20/2023 CLINICAL DATA:  Polytrauma, blunt EXAM: CT HEAD WITHOUT CONTRAST TECHNIQUE: Contiguous axial images were obtained from the base of the skull through the vertex without intravenous contrast. RADIATION DOSE REDUCTION: This exam was performed according to the departmental dose-optimization program which includes automated exposure control, adjustment of the mA and/or kV according to patient size and/or use of iterative reconstruction technique. COMPARISON:  03/18/2013, 07/05/2014 FINDINGS: Brain: No evidence of acute infarction, hemorrhage, hydrocephalus, extra-axial collection or mass lesion/mass effect. Extensive low-density changes within the periventricular and subcortical white matter most compatible with chronic microvascular ischemic change. Mild-moderate diffuse cerebral volume loss. Vascular: Atherosclerotic calcifications involving the large vessels of the skull base. No unexpected hyperdense vessel. Skull: Normal. Negative for fracture or focal lesion. Sinuses/Orbits: Small amount of layering fluid in the left maxillary sinus. Other: Mild focal soft tissue swelling of the anterior right frontal scalp. IMPRESSION: 1. No acute intracranial findings. 2. Mild focal soft tissue swelling of the anterior right frontal scalp. No underlying calvarial fracture. 3. Small amount of layering fluid in the left maxillary sinus. Correlate for acute sinusitis. Electronically Signed   By: Duanne Guess D.O.   On: 01/20/2023 13:16   DG Pelvis Portable Result Date:  01/20/2023 CLINICAL DATA:  Fall EXAM: PORTABLE PELVIS 1-2 VIEWS COMPARISON:  None Available. FINDINGS: There is no evidence of pelvic fracture or diastasis. Prior left total hip arthroplasty. Severe degenerative changes of the right hip. Advanced atherosclerotic vascular calcifications. IMPRESSION: Negative. Electronically Signed   By: Duanne Guess D.O.   On: 01/20/2023 13:13   DG Chest Port 1 View Result Date: 01/20/2023 CLINICAL DATA:  Fall EXAM: PORTABLE CHEST 1 VIEW COMPARISON:  05/02/2021 FINDINGS: The heart size and mediastinal contours are within normal limits. Aortic atherosclerosis. Elevation of the right hemidiaphragm. No focal airspace consolidation, pleural effusion, or pneumothorax. The  visualized skeletal structures are unremarkable. IMPRESSION: No active disease. Electronically Signed   By: Duanne Guess D.O.   On: 01/20/2023 13:12    Assessment & Plan:  Type 2 diabetes mellitus with diabetic nephropathy, without long-term current use of insulin (HCC) -     Microalbumin / creatinine urine ratio; Future -     Urinalysis, Routine w reflex microscopic; Future -     Hemoglobin A1c; Future  Stage 3b chronic kidney disease (HCC) -     Urinalysis, Routine w reflex microscopic; Future  Protein-calorie malnutrition, mild (HCC) -     AMB Referral [facility] Care Management  Cerebrovascular accident (CVA) due to thrombosis of precerebral artery (HCC) -     AMB Referral [facility] Care Management  Hypoglycemia due to insulin -     Gvoke HypoPen 2-Pack; Inject 1 Act into the skin daily as needed.  Dispense: 2 mL; Refill: 5 -     Dexcom G7 Receiver; 1 Act by Does not apply route daily.  Dispense: 9 each; Refill: 1 -     Dexcom G7 Sensor; 1 Act by Does not apply route daily.  Dispense: 9 each; Refill: 1  Insulin-requiring or dependent type II diabetes mellitus (HCC) -     Toujeo Max SoloStar; Inject 20 Units into the skin daily.  Dispense: 6 mL; Refill: 0 -     Gvoke HypoPen 2-Pack; Inject 1  Act into the skin daily as needed.  Dispense: 2 mL; Refill: 5 -     Dexcom G7 Receiver; 1 Act by Does not apply route daily.  Dispense: 9 each; Refill: 1 -     Dexcom G7 Sensor; 1 Act by Does not apply route daily.  Dispense: 9 each; Refill: 1  Need for influenza vaccination -     Flu Vaccine Trivalent High Dose (Fluad)  B12 deficiency -     Cyanocobalamin     Follow-up: No follow-ups on file.  Sanda Linger, MD

## 2023-01-24 ENCOUNTER — Telehealth: Payer: Self-pay | Admitting: *Deleted

## 2023-01-24 LAB — URINALYSIS, ROUTINE W REFLEX MICROSCOPIC
Bilirubin Urine: NEGATIVE
Hgb urine dipstick: NEGATIVE
Ketones, ur: NEGATIVE
Leukocytes,Ua: NEGATIVE
Nitrite: NEGATIVE
RBC / HPF: NONE SEEN (ref 0–?)
Specific Gravity, Urine: 1.02 (ref 1.000–1.030)
Total Protein, Urine: NEGATIVE
Urine Glucose: >=1000 — AB
Urobilinogen, UA: 0.2 (ref 0.0–1.0)
pH: 6 (ref 5.0–8.0)

## 2023-01-24 LAB — MICROALBUMIN / CREATININE URINE RATIO
Creatinine,U: 124.9 mg/dL
Microalb Creat Ratio: 1 mg/g (ref 0.0–30.0)
Microalb, Ur: 1.2 mg/dL (ref 0.0–1.9)

## 2023-01-24 LAB — HEMOGLOBIN A1C: Hgb A1c MFr Bld: 6.2 % (ref 4.6–6.5)

## 2023-01-24 MED ORDER — DEXCOM G7 SENSOR MISC: 1.0000 | Freq: Every day | 1 refills | Status: DC

## 2023-01-24 MED ORDER — DEXCOM G7 RECEIVER DEVI: 1.0000 | Freq: Every day | 1 refills | Status: DC

## 2023-01-24 NOTE — Progress Notes (Signed)
Complex Care Management  Outreach Note  01/24/2023 Name: Michelle Lopez MRN: [id number] DOB: [birth year]   Complex Care Management Outreach Attempts: An unsuccessful telephone outreach was attempted today to offer the patient information about available complex care management services.  Follow Up Plan:  Additional outreach attempts will be made to offer the patient complex care management information and services.   Encounter Outcome:  No Answer  Burman Nieves, CCMA Care Coordination Care Guide Direct Dial: [phone]

## 2023-01-25 ENCOUNTER — Encounter: Payer: Self-pay | Admitting: Internal Medicine

## 2023-01-25 ENCOUNTER — Other Ambulatory Visit: Payer: Self-pay | Admitting: Internal Medicine

## 2023-01-25 DIAGNOSIS — E1121 Type 2 diabetes mellitus with diabetic nephropathy: Secondary | ICD-10-CM

## 2023-01-25 DIAGNOSIS — I10 Essential (primary) hypertension: Secondary | ICD-10-CM

## 2023-01-25 DIAGNOSIS — I739 Peripheral vascular disease, unspecified: Secondary | ICD-10-CM

## 2023-01-25 NOTE — Patient Instructions (Signed)
Type 1 Diabetes Mellitus, Diagnosis, Adult  Type 1 diabetes mellitus, or type 1 diabetes, is a long-term (chronic) disease. It happens when the cells in the pancreas that make a hormone called insulin are destroyed. Normally, insulin lets blood sugar (glucose) enter cells in your body. This gives you energy. If you have type 1 diabetes, glucose cannot get into cells. It builds up in the blood instead. This causes high blood glucose (hyperglycemia). There is no cure for type 1 diabetes. Treatment can help you manage your condition. What are the causes? The exact cause of type 1 diabetes is not known. What increases the risk? You may be more likely to have type 1 diabetes if a family member has it as well. You may also be more at risk if: You have a gene for type 1 diabetes that was passed down to you from a parent (inherited). You have an autoimmune disorder. This means that your body's disease-fighting system (immune system) attacks your body. You have been exposed to certain viruses. You live in an area with cold weather. What are the signs or symptoms? Symptoms may begin slowly over days or weeks. They may also start all of a sudden. Symptoms may include: Increased thirst or hunger. Needing to pee (urinate) more often or peeing more at night. Sudden weight changes that you cannot explain. Tiredness (fatigue) or weakness. Vision changes. These may include blurry vision. How is this diagnosed? Type 1 diabetes is diagnosed based on your symptoms, your medical history, a physical exam, and your blood glucose level. Your blood glucose may be checked with: A fasting blood glucose (FBG) test. You will not be allowed to eat (you will fast) for 8 hours or more before a blood sample is taken. A random blood glucose test. This test checks your blood glucose at any time of day no matter when you last ate. A hemoglobin A1C (A1C) blood test. This shows what your blood glucose levels have been over the  last 2-3 months. You may be diagnosed with type 1 diabetes if: Your FBG level is 126 mg/dL (7 mmol/L) or higher. Your random blood glucose level is 200 mg/dL (16.1 mmol/L) or higher. Your A1C level is 6.5% or higher. These blood tests may be done more than once. You may also need other blood tests. How is this treated? You may work with an expert called an endocrinologist to find ways to manage your diabetes. You should also follow instructions from your health care provider. You may need to: Take insulin every day. This helps to keep your blood glucose levels in the right range. Take medicines to help prevent problems from diabetes. You may need to take: Aspirin. Medicine to lower your cholesterol. Medicine to control your blood pressure. Check your blood glucose as often as told. Make diet and lifestyle changes. You may be told to: Follow a nutrition plan that has been made just for you by a dietitian. Get regular exercise. Find ways to manage stress. Your provider will set treatment goals that are right for you. These goals will be based on your age, other conditions you have, and how you respond to treatment. In general, your A1C level should be less than 7% and your blood glucose levels should be: 80-130 mg/dL (0.9-6.0 mmol/L) before meals (preprandial). Below 180 mg/dL (10 mmol/L) after meals (postprandial). Follow these instructions at home: Questions to ask your health care provider You may want to ask your provider: Do I need to meet with a certified  diabetes care and education specialist? Should I join a support group for people with diabetes? What equipment will I need to manage my diabetes at home? What diabetes medicines should I take, and when? How often should I check my blood glucose? What number should I call if I have questions? When is my next appointment? General instructions Take over-the-counter and prescription medicines only as told by your provider. Keep all  follow-up visits. You will need regular blood tests to make sure the treatments are working. Your provider may adjust your medicines based on your test results. Where to find more information American Diabetes Association (ADA): diabetes.org Association of Diabetes Care and Education Specialists (ADCES): diabeteseducator.org International Diabetes Federation (IDF): http://hill.biz/ Contact a health care provider if: Your blood glucose level is higher than 240 mg/dL (16.1 mmol/L) for 2 days in a row. You have been sick or have had a fever for 2 days or more, and you are not getting better. For more than 6 hours, you: Cannot eat or drink. Have nausea and vomiting. Have diarrhea. Get help right away if: Your blood glucose is less than 54 mg/dL (3 mmol/L). You become confused, or you have trouble thinking clearly. You have trouble breathing. These symptoms may be an emergency. Get help right away. Call 911. Do not wait to see if the symptoms will go away. Do not drive yourself to the hospital. This information is not intended to replace advice given to you by your health care provider. Make sure you discuss any questions you have with your health care provider. Document Revised: 09/26/2021 Document Reviewed: 09/26/2021 Elsevier Patient Education  2024 [employer].

## 2023-01-25 NOTE — Progress Notes (Signed)
Complex Care Management  Outreach Note  01/25/2023 Name: Michelle Lopez MRN: [id number] DOB: 12-24-1945   Complex Care Management Outreach Attempts: A second unsuccessful outreach was attempted today to offer the patient with information about available complex care management services.  Follow Up Plan:  Additional outreach attempts will be made to offer the patient complex care management information and services.   Encounter Outcome:  No Answer  Burman Nieves, CCMA Care Coordination Care Guide Direct Dial: [phone]

## 2023-01-26 ENCOUNTER — Other Ambulatory Visit: Payer: Self-pay

## 2023-01-26 NOTE — Progress Notes (Signed)
Complex Care Management  Outreach Note  01/26/2023 Name: Dayli Priester MRN: [id number] DOB: [birth year]   Complex Care Management Outreach Attempts: A third unsuccessful outreach was attempted today to offer the patient with information about available complex care management services.  Follow Up Plan:  No further outreach attempts will be made at this time. We have been unable to contact the patient to offer or enroll patient in complex care management services.  Encounter Outcome:  No Answer  Burman Nieves, CCMA Care Coordination Care Guide Direct Dial: [phone]

## 2023-02-12 ENCOUNTER — Encounter: Payer: Self-pay | Admitting: Internal Medicine

## 2023-02-12 ENCOUNTER — Ambulatory Visit (INDEPENDENT_AMBULATORY_CARE_PROVIDER_SITE_OTHER): Payer: 59 | Admitting: Internal Medicine

## 2023-02-12 VITALS — BP 146/70 | HR 62 | Temp 97.9°F | Resp 16 | Ht 60.0 in | Wt 128.0 lb

## 2023-02-12 DIAGNOSIS — Z794 Long term (current) use of insulin: Secondary | ICD-10-CM | POA: Diagnosis not present

## 2023-02-12 DIAGNOSIS — Z8673 Personal history of transient ischemic attack (TIA), and cerebral infarction without residual deficits: Secondary | ICD-10-CM | POA: Diagnosis not present

## 2023-02-12 DIAGNOSIS — E1121 Type 2 diabetes mellitus with diabetic nephropathy: Secondary | ICD-10-CM

## 2023-02-12 DIAGNOSIS — E119 Type 2 diabetes mellitus without complications: Secondary | ICD-10-CM | POA: Diagnosis not present

## 2023-02-12 DIAGNOSIS — I6300 Cerebral infarction due to thrombosis of unspecified precerebral artery: Secondary | ICD-10-CM

## 2023-02-12 MED ORDER — TOUJEO MAX SOLOSTAR 300 UNIT/ML ~~LOC~~ SOPN: 10.0000 [IU] | PEN_INJECTOR | Freq: Every day | SUBCUTANEOUS | 0 refills | Status: DC

## 2023-02-12 NOTE — Progress Notes (Signed)
 Subjective:  Patient ID: Michelle Lopez, female    DOB: May 22, 1945  Age: 78 y.o. MRN: [id number]  CC: Hypertension and Diabetes   HPI Michelle Lopez presents for f/up ----  Discussed the use of AI scribe software for clinical note transcription with the patient, who gave verbal consent to proceed.  History of Present Illness   The patient, with a history of diabetes, has been experiencing frequent episodes of hypoglycemia, with blood glucose levels dropping as low as 55. These episodes seem to occur daily and have been managed by the patient's caregiver with peanut butter crackers. The patient's current insulin  dosage was recently reduced from 30 to 20, but the hypoglycemic episodes persist.  The patient's weight was reported as 128, with no known changes in weight or appetite. The patient is also on Farxiga  for kidney and heart protection.  In addition to diabetes management, the patient's caregiver expressed concerns about the patient's physical activity levels. The patient spends most of her time in bed and does not engage in any exercise. The caregiver is interested in arranging for physical therapy at home to encourage more activity.  The patient also has a need for incontinence supplies, which the caregiver is coordinating. The patient was previously using a continuous glucose monitor, but the caregiver plans to return to finger-stick glucose checks due to discomfort with the monitor.       Outpatient Medications Prior to Visit  Medication Sig Dispense Refill   aspirin  81 MG tablet Take 81 mg by mouth daily.     atorvastatin  (LIPITOR) 20 MG tablet TAKE 1 TABLET BY MOUTH EVERY DAY 90 tablet 0   Blood Glucose Monitoring Suppl (CONTOUR NEXT EZ) w/Device KIT 1 Act by Does not apply route 2 (two) times daily. 1 kit 3   clopidogrel  (PLAVIX ) 75 MG tablet TAKE 1 TABLET BY MOUTH ONCE DAILY 90 tablet 0   Continuous Glucose Receiver (DEXCOM G7 RECEIVER) DEVI 1 Act by Does not apply route daily.  9 each 1   Continuous Glucose Sensor (DEXCOM G7 SENSOR) MISC 1 Act by Does not apply route daily. 9 each 1   Cyanocobalamin  (VITAMIN B 12 PO) Take by mouth.     dapagliflozin  propanediol (FARXIGA ) 10 MG TABS tablet TAKE 1 TABLET BY MOUTH EVERY DAY 90 tablet 0   Glucagon  (GVOKE HYPOPEN  2-PACK) 1 MG/0.2ML SOAJ Inject 1 Act into the skin daily as needed. 2 mL 5   metoprolol  tartrate (LOPRESSOR ) 25 MG tablet TAKE 1/2 TABLET BY MOUTH 2 TIMES DAILY 90 tablet 0   OneTouch UltraSoft 2 Lancets MISC USE TO CHECK BLOOD SUGAR 2 TIMES DAILY 300 each 1   tamsulosin  (FLOMAX ) 0.4 MG CAPS capsule TAKE 1 CAPSULE BY MOUTH EVERY MORNING 90 capsule 0   TRUEPLUS 5-BEVEL PEN NEEDLES 31G X 6 MM MISC USE TO INJECT insulin  UP TO 5 TIMES DAILY 300 each 1   VITAMIN E PO Take by mouth.     insulin  glargine, 2 Unit Dial , (TOUJEO  MAX SOLOSTAR) 300 UNIT/ML Solostar Pen Inject 20 Units into the skin daily. 6 mL 0   HYDROcodone -acetaminophen  (NORCO/VICODIN) 5-325 MG tablet Take 0.5-1 tablets by mouth every 6 (six) hours as needed for moderate pain or severe pain. 10 tablet 0   No facility-administered medications prior to visit.    ROS Review of Systems  Constitutional:  Positive for fatigue. Negative for appetite change, chills, diaphoresis, fever and unexpected weight change.  HENT:  Positive for trouble swallowing. Negative for voice change.  Eyes: Negative.   Respiratory:  Negative for cough, shortness of breath and wheezing.   Cardiovascular:  Negative for chest pain, palpitations and leg swelling.  Gastrointestinal:  Negative for abdominal pain, constipation, diarrhea, nausea and vomiting.  Endocrine: Negative.   Genitourinary: Negative.  Negative for difficulty urinating.  Musculoskeletal:  Positive for gait problem.  Skin: Negative.  Negative for color change and pallor.  Neurological:  Positive for weakness. Negative for dizziness and numbness.  Hematological:  Negative for adenopathy. Does not bruise/bleed  easily.  Psychiatric/Behavioral:  Positive for behavioral problems, confusion and decreased concentration. The patient is not nervous/anxious.     Objective:  BP (!) 146/70 (BP Location: Right Arm, Patient Position: Sitting)   Pulse 62   Temp 97.9 F (36.6 C) (Oral)   Resp 16   Ht 5' (1.524 m)   Wt 128 lb (58.1 kg)   SpO2 96%   BMI 25.00 kg/m   BP Readings from Last 3 Encounters:  02/12/23 (!) 146/70  01/23/23 (!) 142/68  01/20/23 (!) 202/87    Wt Readings from Last 3 Encounters:  02/12/23 128 lb (58.1 kg)  01/23/23 130 lb (59 kg)  01/20/23 128 lb 15.5 oz (58.5 kg)    Physical Exam Vitals reviewed.  Constitutional:      General: She is not in acute distress.    Appearance: She is ill-appearing. She is not toxic-appearing or diaphoretic.  HENT:     Nose: Nose normal.     Mouth/Throat:     Mouth: Mucous membranes are moist.  Eyes:     General: No scleral icterus.    Conjunctiva/sclera: Conjunctivae normal.  Cardiovascular:     Rate and Rhythm: Normal rate and regular rhythm.     Heart sounds: No murmur heard. Pulmonary:     Effort: Pulmonary effort is normal.     Breath sounds: No stridor. No wheezing, rhonchi or rales.  Abdominal:     General: Abdomen is flat.     Palpations: There is no mass.     Tenderness: There is no abdominal tenderness. There is no guarding.     Hernia: No hernia is present.  Musculoskeletal:        General: Normal range of motion.     Cervical back: Neck supple.     Right lower leg: No edema.     Left lower leg: No edema.  Lymphadenopathy:     Cervical: No cervical adenopathy.  Neurological:     Mental Status: She is alert. Mental status is at baseline.     Cranial Nerves: Cranial nerve deficit present.     Sensory: Sensory deficit present.     Motor: Weakness present.     Coordination: Coordination abnormal.     Gait: Gait abnormal.     Deep Tendon Reflexes: Reflexes abnormal.  Psychiatric:        Mood and Affect: Mood normal.         Behavior: Behavior normal.     Lab Results  Component Value Date   WBC 6.8 01/20/2023   HGB 14.5 01/20/2023   HCT 46.7 (H) 01/20/2023   PLT 209 01/20/2023   GLUCOSE 174 (H) 01/20/2023   CHOL 179 06/01/2022   TRIG 190.0 (H) 06/01/2022   HDL 37.20 (L) 06/01/2022   LDLDIRECT 100.0 07/06/2015   LDLCALC 104 (H) 06/01/2022   ALT 10 01/20/2023   AST 23 01/20/2023   NA 140 01/20/2023   K 3.5 01/20/2023   CL 106 01/20/2023  CREATININE 0.95 01/20/2023   BUN 14 01/20/2023   CO2 23 01/20/2023   TSH 1.44 02/22/2022   INR 0.9 04/03/2019   HGBA1C 6.2 01/23/2023   MICROALBUR 1.2 01/23/2023    CT Cervical Spine Wo Contrast Result Date: 01/20/2023 CLINICAL DATA:  Polytrauma, blunt EXAM: CT CERVICAL SPINE WITHOUT CONTRAST TECHNIQUE: Multidetector CT imaging of the cervical spine was performed without intravenous contrast. Multiplanar CT image reconstructions were also generated. RADIATION DOSE REDUCTION: This exam was performed according to the departmental dose-optimization program which includes automated exposure control, adjustment of the mA and/or kV according to patient size and/or use of iterative reconstruction technique. COMPARISON:  None Available. FINDINGS: Alignment: Facet joints are aligned without dislocation or traumatic listhesis. Dens and lateral masses are aligned. Straightening and slight reversal of the cervical lordosis. Skull base and vertebrae: No acute fracture. No primary bone lesion or focal pathologic process. Soft tissues and spinal canal: No prevertebral fluid or swelling. No visible canal hematoma. Disc levels: Degenerative disc disease is most pronounced at C5-6. Multilevel bilateral facet arthropathy. Upper chest: Negative. Other: Bilateral carotid atherosclerosis. IMPRESSION: No acute fracture or traumatic listhesis of the cervical spine. Electronically Signed   By: Mabel Converse D.O.   On: 01/20/2023 13:20   CT Head Wo Contrast Result Date:  01/20/2023 CLINICAL DATA:  Polytrauma, blunt EXAM: CT HEAD WITHOUT CONTRAST TECHNIQUE: Contiguous axial images were obtained from the base of the skull through the vertex without intravenous contrast. RADIATION DOSE REDUCTION: This exam was performed according to the departmental dose-optimization program which includes automated exposure control, adjustment of the mA and/or kV according to patient size and/or use of iterative reconstruction technique. COMPARISON:  03/18/2013, 07/05/2014 FINDINGS: Brain: No evidence of acute infarction, hemorrhage, hydrocephalus, extra-axial collection or mass lesion/mass effect. Extensive low-density changes within the periventricular and subcortical white matter most compatible with chronic microvascular ischemic change. Mild-moderate diffuse cerebral volume loss. Vascular: Atherosclerotic calcifications involving the large vessels of the skull base. No unexpected hyperdense vessel. Skull: Normal. Negative for fracture or focal lesion. Sinuses/Orbits: Small amount of layering fluid in the left maxillary sinus. Other: Mild focal soft tissue swelling of the anterior right frontal scalp. IMPRESSION: 1. No acute intracranial findings. 2. Mild focal soft tissue swelling of the anterior right frontal scalp. No underlying calvarial fracture. 3. Small amount of layering fluid in the left maxillary sinus. Correlate for acute sinusitis. Electronically Signed   By: Mabel Converse D.O.   On: 01/20/2023 13:16   DG Pelvis Portable Result Date: 01/20/2023 CLINICAL DATA:  Fall EXAM: PORTABLE PELVIS 1-2 VIEWS COMPARISON:  None Available. FINDINGS: There is no evidence of pelvic fracture or diastasis. Prior left total hip arthroplasty. Severe degenerative changes of the right hip. Advanced atherosclerotic vascular calcifications. IMPRESSION: Negative. Electronically Signed   By: Mabel Converse D.O.   On: 01/20/2023 13:13   DG Chest Port 1 View Result Date: 01/20/2023 CLINICAL DATA:   Fall EXAM: PORTABLE CHEST 1 VIEW COMPARISON:  05/02/2021 FINDINGS: The heart size and mediastinal contours are within normal limits. Aortic atherosclerosis. Elevation of the right hemidiaphragm. No focal airspace consolidation, pleural effusion, or pneumothorax. The visualized skeletal structures are unremarkable. IMPRESSION: No active disease. Electronically Signed   By: Mabel Converse D.O.   On: 01/20/2023 13:12    Assessment & Plan:   Cerebrovascular accident (CVA) due to thrombosis of precerebral artery (HCC) -     Ambulatory referral to Home Health  Insulin -requiring or dependent type II diabetes mellitus (HCC)- Will decrease her basal  insulin  dose by 50%. -     Toujeo  Max SoloStar; Inject 10 Units into the skin daily.  Dispense: 6 mL; Refill: 0 -     Ambulatory referral to Home Health     Follow-up: Return in about 4 months (around 06/12/2023).  Debby Molt, MD

## 2023-02-12 NOTE — Patient Instructions (Signed)
Type 1 Diabetes Mellitus, Diagnosis, Adult  Type 1 diabetes mellitus, or type 1 diabetes, is a long-term (chronic) disease. It happens when the cells in the pancreas that make a hormone called insulin are destroyed. Normally, insulin lets blood sugar (glucose) enter cells in your body. This gives you energy. If you have type 1 diabetes, glucose cannot get into cells. It builds up in the blood instead. This causes high blood glucose (hyperglycemia). There is no cure for type 1 diabetes. Treatment can help you manage your condition. What are the causes? The exact cause of type 1 diabetes is not known. What increases the risk? You may be more likely to have type 1 diabetes if a family member has it as well. You may also be more at risk if: You have a gene for type 1 diabetes that was passed down to you from a parent (inherited). You have an autoimmune disorder. This means that your body's disease-fighting system (immune system) attacks your body. You have been exposed to certain viruses. You live in an area with cold weather. What are the signs or symptoms? Symptoms may begin slowly over days or weeks. They may also start all of a sudden. Symptoms may include: Increased thirst or hunger. Needing to pee (urinate) more often or peeing more at night. Sudden weight changes that you cannot explain. Tiredness (fatigue) or weakness. Vision changes. These may include blurry vision. How is this diagnosed? Type 1 diabetes is diagnosed based on your symptoms, your medical history, a physical exam, and your blood glucose level. Your blood glucose may be checked with: A fasting blood glucose (FBG) test. You will not be allowed to eat (you will fast) for 8 hours or more before a blood sample is taken. A random blood glucose test. This test checks your blood glucose at any time of day no matter when you last ate. A hemoglobin A1C (A1C) blood test. This shows what your blood glucose levels have been over the  last 2-3 months. You may be diagnosed with type 1 diabetes if: Your FBG level is 126 mg/dL (7 mmol/L) or higher. Your random blood glucose level is 200 mg/dL (16.1 mmol/L) or higher. Your A1C level is 6.5% or higher. These blood tests may be done more than once. You may also need other blood tests. How is this treated? You may work with an expert called an endocrinologist to find ways to manage your diabetes. You should also follow instructions from your health care provider. You may need to: Take insulin every day. This helps to keep your blood glucose levels in the right range. Take medicines to help prevent problems from diabetes. You may need to take: Aspirin. Medicine to lower your cholesterol. Medicine to control your blood pressure. Check your blood glucose as often as told. Make diet and lifestyle changes. You may be told to: Follow a nutrition plan that has been made just for you by a dietitian. Get regular exercise. Find ways to manage stress. Your provider will set treatment goals that are right for you. These goals will be based on your age, other conditions you have, and how you respond to treatment. In general, your A1C level should be less than 7% and your blood glucose levels should be: 80-130 mg/dL (0.9-6.0 mmol/L) before meals (preprandial). Below 180 mg/dL (10 mmol/L) after meals (postprandial). Follow these instructions at home: Questions to ask your health care provider You may want to ask your provider: Do I need to meet with a certified  diabetes care and education specialist? Should I join a support group for people with diabetes? What equipment will I need to manage my diabetes at home? What diabetes medicines should I take, and when? How often should I check my blood glucose? What number should I call if I have questions? When is my next appointment? General instructions Take over-the-counter and prescription medicines only as told by your provider. Keep all  follow-up visits. You will need regular blood tests to make sure the treatments are working. Your provider may adjust your medicines based on your test results. Where to find more information American Diabetes Association (ADA): diabetes.org Association of Diabetes Care and Education Specialists (ADCES): diabeteseducator.org International Diabetes Federation (IDF): http://hill.biz/ Contact a health care provider if: Your blood glucose level is higher than 240 mg/dL (16.1 mmol/L) for 2 days in a row. You have been sick or have had a fever for 2 days or more, and you are not getting better. For more than 6 hours, you: Cannot eat or drink. Have nausea and vomiting. Have diarrhea. Get help right away if: Your blood glucose is less than 54 mg/dL (3 mmol/L). You become confused, or you have trouble thinking clearly. You have trouble breathing. These symptoms may be an emergency. Get help right away. Call 911. Do not wait to see if the symptoms will go away. Do not drive yourself to the hospital. This information is not intended to replace advice given to you by your health care provider. Make sure you discuss any questions you have with your health care provider. Document Revised: 09/26/2021 Document Reviewed: 09/26/2021 Elsevier Patient Education  2024 [employer].

## 2023-02-13 DIAGNOSIS — E119 Type 2 diabetes mellitus without complications: Secondary | ICD-10-CM | POA: Insufficient documentation

## 2023-02-14 ENCOUNTER — Ambulatory Visit (INDEPENDENT_AMBULATORY_CARE_PROVIDER_SITE_OTHER): Payer: 59 | Admitting: Podiatry

## 2023-02-14 ENCOUNTER — Encounter: Payer: Self-pay | Admitting: Podiatry

## 2023-02-14 DIAGNOSIS — B351 Tinea unguium: Secondary | ICD-10-CM | POA: Diagnosis not present

## 2023-02-14 DIAGNOSIS — M79674 Pain in right toe(s): Secondary | ICD-10-CM

## 2023-02-14 DIAGNOSIS — M79675 Pain in left toe(s): Secondary | ICD-10-CM | POA: Diagnosis not present

## 2023-02-14 DIAGNOSIS — E1151 Type 2 diabetes mellitus with diabetic peripheral angiopathy without gangrene: Secondary | ICD-10-CM

## 2023-02-16 ENCOUNTER — Other Ambulatory Visit: Payer: Self-pay

## 2023-02-16 ENCOUNTER — Ambulatory Visit: Payer: 59

## 2023-02-16 ENCOUNTER — Encounter: Payer: Self-pay | Admitting: Podiatry

## 2023-02-16 ENCOUNTER — Ambulatory Visit: Payer: 59 | Admitting: Podiatry

## 2023-02-16 DIAGNOSIS — M79672 Pain in left foot: Secondary | ICD-10-CM

## 2023-02-16 NOTE — Progress Notes (Signed)
 Subjective:  Patient ID: Michelle Lopez, female    DOB: 07-09-1945,  MRN: [id number]  78 y.o. female presents to clinic with  at risk foot care. Pt has h/o NIDDM with PAD and painful, discolored, thick toenails which interfere with daily activities. She is accompanied by her daughter on today's visit. Daughter has concern regarding patient's left foot. Patient was seen in September and diagnosed with stress fractures of left 2nd, 3rd and 4th metatarsals. She has a walking boot as well as a Darco shoe. Daughter states patient was wearing devices all day as well as in bed. Patient started to complain about pain when wearing both devices. Left foot is still swollen. Patient states left foot is not painful today. They would like guidance regarding the stress fractures as she has had no follow up xrays. Chief Complaint  Patient presents with   Scnetx    RM#[id number] DFC A1C 01/24/2023 6.2    PCP is Joshua Debby CROME, MD. Last visit 02/12/2023.  Allergies  Allergen Reactions   Amlodipine  Other (See Comments)    Ankle edema   Crestor  [Rosuvastatin  Calcium ]     myalgias   Morphine  Hives   Pravastatin Sodium Rash   Simvastatin Rash    Review of Systems: Negative except as noted in the HPI.   Objective:  Michelle Lopez is a pleasant 78 y.o. female WD, WN in NAD. AAO x 3.  Vascular Examination: CFT <3 seconds b/l LE. Diminished DP pulse(s) b/l LE. Diminished PT pulse(s) b/l LE. Pedal hair absent. No pain with calf compression b/l. No edema noted right foot. Nonpitting edema noted left foot. No ischemia or gangrene noted b/l LE. No cyanosis or clubbing noted b/l LE.  Neurological Examination: Sensation grossly intact b/l with 10 gram monofilament. Vibratory sensation intact b/l.   Dermatological Examination: Pedal skin with normal turgor, texture and tone b/l. Toenails 1-5 b/l thick, discolored, elongated with subungual debris and pain on dorsal palpation. No hyperkeratotic lesions noted b/l.    Musculoskeletal Examination: Muscle strength 5/5 to b/l LE. HAV with bunion deformity noted b/l LE. Hammertoe(s) noted to the bilateral 2nd toes.  Radiographs: None  Last A1c:      Latest Ref Rng & Units 01/23/2023    4:28 PM 06/01/2022    2:33 PM  Hemoglobin A1C  Hemoglobin-A1c 4.6 - 6.5 % 6.2  8.1      Assessment:   1. Pain due to onychomycosis of toenails of both feet   2. Type II diabetes mellitus with peripheral circulatory disorder [facility])    Plan:  -Patient's family member present. All questions/concerns addressed on today's visit. -Consent given for treatment as described below: -Examined patient. -Assisted family in scheduling patient follow up appointment with Dr. Sikora for follow up of stress fractures left foot and they will see her in [place] office on this Friday. Daughter instructed to bring both the walking boot as well as the Darco shoe to the appointment. They related understanding. -Patient to continue soft, supportive shoe gear daily. -Mycotic toenails 1-5 bilaterally were debrided in length and girth with sterile nail nippers and dremel without incident. -Patient/POA to call should there be question/concern in the interim.  Return in about 3 months (around 05/15/2023).  Michelle Lopez, DPM      [place] LOCATION: [address]  [place], [place] [address]                   Office [phone]   [facility] LOCATION: [address] [place], [place] [address] Office [phone]

## 2023-02-20 ENCOUNTER — Ambulatory Visit: Payer: 59 | Admitting: Podiatry

## 2023-02-26 ENCOUNTER — Telehealth: Payer: Self-pay | Admitting: Internal Medicine

## 2023-02-26 NOTE — Telephone Encounter (Signed)
Reason for NWG:NFAOZH called to check the status of the patient receiving her incontinent supplies and paperwork for home care for the patient. She stated that they discussed this at the time of the patient's last appt with Dr. Yetta Barre on 1/6. Please callback and advise.  [phone]

## 2023-02-27 ENCOUNTER — Ambulatory Visit: Payer: 59 | Admitting: Podiatry

## 2023-02-27 NOTE — Telephone Encounter (Signed)
Michelle Lopez that she would have to reach out to social services because we have already completed the for for her to have her incontinence supplies. Also advised her I would have to reach out to our referral coordinator in regards to where the Home health referral went. She gave a verbal understanding.

## 2023-02-27 NOTE — Telephone Encounter (Signed)
Michelle Lopez has been informed that we are waiting to see which home health agency will pick Michelle Lopez case up. She gave a verbal understanding.

## 2023-03-06 ENCOUNTER — Ambulatory Visit (INDEPENDENT_AMBULATORY_CARE_PROVIDER_SITE_OTHER): Payer: 59 | Admitting: Podiatry

## 2023-03-06 DIAGNOSIS — Z91199 Patient's noncompliance with other medical treatment and regimen due to unspecified reason: Secondary | ICD-10-CM

## 2023-03-06 NOTE — Progress Notes (Signed)
No show

## 2023-03-16 ENCOUNTER — Ambulatory Visit: Payer: Self-pay | Admitting: Internal Medicine

## 2023-03-16 NOTE — Telephone Encounter (Signed)
 Copied from CRM [id number]. Topic: Clinical - Red Word Triage >> Mar 16, 2023 10:43 AM Zebedee SAUNDERS wrote: Red Word that prompted transfer to Nurse Triage: Pt was exposed to flu and has fever, coughing, congestion.   Chief Complaint: Influenza Exposure with Symptoms Symptoms: Cough,  Congestion, Fever Frequency: Acute Pertinent Negatives: Patient denies chest pain or dyspnea Disposition: [] ED /[] Urgent Care (no appt availability in office) / [x] Appointment(In office/virtual)/ []  [facility]/ [] Home Care/ [] Refused Recommended Disposition /[] [facility]/ []  Follow-up with PCP Additional Notes: CE is being triaged for influenza symptoms after exposure recently to her diagnosed daughter. This RN educated pt on home care, new-worsening symptoms, when to call back/seek emergent care. Pt verbalized understanding and agrees to plan. Scheduled patient per preference on 03/19/2023.   Reason for Disposition  [1] Influenza EXPOSURE (Close Contact) within last 48 hours (2 days) AND [2] exposed person is HIGH RISK (e.g., age > 64 years, pregnant, HIV+, chronic medical condition)  Answer Assessment - Initial Assessment Questions 1. TYPE of EXPOSURE: How were you exposed? (e.g., close contact, not a close contact)     Close Contact  2. DATE of EXPOSURE: When did the exposure occur? (e.g., hour, days, weeks)      Days  3. PREGNANCY: Is there any chance you are pregnant? When was your last menstrual period?     No  4. HIGH RISK for COMPLICATIONS: Do you have any heart or lung problems? Do you have a weakened immune system? (e.g., CHF, COPD, asthma, HIV positive, chemotherapy, renal failure, diabetes mellitus, sickle cell anemia)     Diabetic, Heart History  5. SYMPTOMS: Do you have any symptoms? (e.g., cough, fever, sore throat, difficulty breathing).     Coughing, Congestion, Fever, Sneezing  Protocols used: Influenza Exposure-A-AH

## 2023-03-19 ENCOUNTER — Ambulatory Visit (INDEPENDENT_AMBULATORY_CARE_PROVIDER_SITE_OTHER): Payer: 59

## 2023-03-19 ENCOUNTER — Ambulatory Visit (INDEPENDENT_AMBULATORY_CARE_PROVIDER_SITE_OTHER): Payer: 59 | Admitting: Family Medicine

## 2023-03-19 ENCOUNTER — Encounter: Payer: Self-pay | Admitting: Family Medicine

## 2023-03-19 VITALS — BP 124/80 | HR 70 | Temp 97.8°F | Ht 60.0 in

## 2023-03-19 DIAGNOSIS — R531 Weakness: Secondary | ICD-10-CM

## 2023-03-19 DIAGNOSIS — B349 Viral infection, unspecified: Secondary | ICD-10-CM

## 2023-03-19 DIAGNOSIS — R051 Acute cough: Secondary | ICD-10-CM

## 2023-03-19 LAB — COMPREHENSIVE METABOLIC PANEL
ALT: 10 U/L (ref 0–35)
AST: 24 U/L (ref 0–37)
Albumin: 3.6 g/dL (ref 3.5–5.2)
Alkaline Phosphatase: 52 U/L (ref 39–117)
BUN: 20 mg/dL (ref 6–23)
CO2: 27 meq/L (ref 19–32)
Calcium: 8.9 mg/dL (ref 8.4–10.5)
Chloride: 104 meq/L (ref 96–112)
Creatinine, Ser: 1.12 mg/dL (ref 0.40–1.20)
GFR: 46.58 mL/min — ABNORMAL LOW (ref >60.00)
Glucose, Bld: 99 mg/dL (ref 70–99)
Potassium: 3.1 meq/L — ABNORMAL LOW (ref 3.5–5.1)
Sodium: 141 meq/L (ref 135–145)
Total Bilirubin: 0.4 mg/dL (ref 0.2–1.2)
Total Protein: 7.3 g/dL (ref 6.0–8.3)

## 2023-03-19 LAB — POCT INFLUENZA A/B
Influenza A, POC: NEGATIVE
Influenza B, POC: NEGATIVE

## 2023-03-19 MED ORDER — OSELTAMIVIR PHOSPHATE 75 MG PO CAPS: 75.0000 mg | ORAL_CAPSULE | Freq: Two times a day (BID) | ORAL | 0 refills | Status: AC

## 2023-03-19 MED ORDER — BENZONATATE 100 MG PO CAPS: 100.0000 mg | ORAL_CAPSULE | Freq: Three times a day (TID) | ORAL | 0 refills | Status: DC | PRN

## 2023-03-19 NOTE — Patient Instructions (Signed)
 I have sent in tamiflu  for you to take twice a day for 5 days.   I have sent in tessalon  perles for you to take 200mg  three times per day as needed for cough. This medication should not make you sleepy. Be sure to drink plenty of fluids with this medication.  We are checking labs today, will be in contact with any results that require further attention  We are getting an xray today. We will be in contact with any abnormal results that require further attention.  Continue supportive care at home, make sure you are drinking plenty of fluids and getting some rest.  Follow-up with me if symptoms are persisting over the next week.

## 2023-03-19 NOTE — Progress Notes (Signed)
 Acute Office Visit  Subjective:     Patient ID: Michelle Lopez, female    DOB: 12/10/1945, 78 y.o.   MRN: [id number]  Chief Complaint  Patient presents with   Nasal Congestion    Eyes running, draining   Cough   Diarrhea    HPI Patient is in today for evaluation of nasal congestion, cough, fatigue, fever, weakness, diarrhea for the last 5 days. Reports new onset stool leakage today with diarrhea. Has tried pushing fluids and rest. Reports flu exposure with her daughter that lives with her. Denies abdominal pain, nausea, vomiting, rash, other symptoms.  Medical hx as outlined below.  ROS Per HPI      Objective:    BP 124/80 (BP Location: Left Arm, Patient Position: Sitting, Cuff Size: Normal)   Pulse 70   Temp 97.8 F (36.6 C) (Temporal)   Ht 5' (1.524 m)   SpO2 98%   BMI 25.00 kg/m    Physical Exam Vitals and nursing note reviewed.  Constitutional:      General: She is not in acute distress.    Appearance: She is ill-appearing.  HENT:     Head: Normocephalic and atraumatic.     Nose: No congestion.     Mouth/Throat:     Mouth: Mucous membranes are moist.     Pharynx: Oropharynx is clear. No oropharyngeal exudate or posterior oropharyngeal erythema.  Eyes:     Extraocular Movements: Extraocular movements intact.  Cardiovascular:     Rate and Rhythm: Regular rhythm. Bradycardia present.  Pulmonary:     Effort: Pulmonary effort is normal. No respiratory distress.     Breath sounds: Examination of the right-middle field reveals decreased breath sounds. Examination of the left-middle field reveals decreased breath sounds. Examination of the right-lower field reveals decreased breath sounds. Examination of the left-lower field reveals decreased breath sounds. Decreased breath sounds present. No wheezing, rhonchi or rales.  Musculoskeletal:     Cervical back: Normal range of motion and neck supple.  Lymphadenopathy:     Cervical: No cervical adenopathy.  Skin:     General: Skin is warm and dry.  Neurological:     Mental Status: She is alert.    Results for orders placed or performed in visit on 03/19/23  POCT Influenza A/B  Result Value Ref Range   Influenza A, POC Negative Negative   Influenza B, POC Negative Negative        Assessment & Plan:  1. Viral illness (Primary)  - POCT Influenza A/B - CBC with Differential/Platelet - Comprehensive metabolic panel - DG Chest 2 View; Future - oseltamivir  (TAMIFLU ) 75 MG capsule; Take 1 capsule (75 mg total) by mouth 2 (two) times daily for 5 days.  Dispense: 10 capsule; Refill: 0 - Flu testing negative, will treat for symptomatic exposure to the flu within the last 5 days given presentation and risk factors for complications  2. Acute cough  - CBC with Differential/Platelet - Comprehensive metabolic panel - DG Chest 2 View; Future - benzonatate  (TESSALON  PERLES) 100 MG capsule; Take 1 capsule (100 mg total) by mouth 3 (three) times daily as needed for cough.  Dispense: 20 capsule; Refill: 0  3. Weakness  - CBC with Differential/Platelet - Comprehensive metabolic panel - DG Chest 2 View; Future   Meds ordered this encounter  Medications   oseltamivir  (TAMIFLU ) 75 MG capsule    Sig: Take 1 capsule (75 mg total) by mouth 2 (two) times daily for 5 days.  Dispense:  10 capsule    Refill:  0   benzonatate  (TESSALON  PERLES) 100 MG capsule    Sig: Take 1 capsule (100 mg total) by mouth 3 (three) times daily as needed for cough.    Dispense:  20 capsule    Refill:  0    Return if symptoms worsen or fail to improve.  Casimer Clear, FNP

## 2023-03-20 ENCOUNTER — Other Ambulatory Visit: Payer: Self-pay | Admitting: Internal Medicine

## 2023-03-20 DIAGNOSIS — N1832 Chronic kidney disease, stage 3b: Secondary | ICD-10-CM

## 2023-03-20 DIAGNOSIS — E1121 Type 2 diabetes mellitus with diabetic nephropathy: Secondary | ICD-10-CM

## 2023-03-26 ENCOUNTER — Emergency Department (HOSPITAL_COMMUNITY)
Admission: EM | Admit: 2023-03-26 | Discharge: 2023-03-27 | Disposition: A | Discharge status: Home / Self Care | Payer: 59 | Attending: Emergency Medicine | Admitting: Emergency Medicine

## 2023-03-26 ENCOUNTER — Emergency Department (HOSPITAL_COMMUNITY): Payer: 59

## 2023-03-26 DIAGNOSIS — E860 Dehydration: Secondary | ICD-10-CM | POA: Diagnosis not present

## 2023-03-26 DIAGNOSIS — Z7984 Long term (current) use of oral hypoglycemic drugs: Secondary | ICD-10-CM | POA: Diagnosis not present

## 2023-03-26 DIAGNOSIS — I6502 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: Secondary | ICD-10-CM | POA: Insufficient documentation

## 2023-03-26 DIAGNOSIS — E119 Type 2 diabetes mellitus without complications: Secondary | ICD-10-CM | POA: Diagnosis not present

## 2023-03-26 DIAGNOSIS — R410 Disorientation, unspecified: Secondary | ICD-10-CM | POA: Diagnosis present

## 2023-03-26 DIAGNOSIS — I672 Cerebral atherosclerosis: Secondary | ICD-10-CM | POA: Insufficient documentation

## 2023-03-26 DIAGNOSIS — Z7902 Long term (current) use of antithrombotics/antiplatelets: Secondary | ICD-10-CM | POA: Insufficient documentation

## 2023-03-26 DIAGNOSIS — R059 Cough, unspecified: Secondary | ICD-10-CM | POA: Diagnosis not present

## 2023-03-26 DIAGNOSIS — Z794 Long term (current) use of insulin: Secondary | ICD-10-CM | POA: Insufficient documentation

## 2023-03-26 DIAGNOSIS — I6782 Cerebral ischemia: Secondary | ICD-10-CM | POA: Insufficient documentation

## 2023-03-26 DIAGNOSIS — I69354 Hemiplegia and hemiparesis following cerebral infarction affecting left non-dominant side: Secondary | ICD-10-CM | POA: Insufficient documentation

## 2023-03-26 DIAGNOSIS — Z7982 Long term (current) use of aspirin: Secondary | ICD-10-CM | POA: Insufficient documentation

## 2023-03-26 DIAGNOSIS — I10 Essential (primary) hypertension: Secondary | ICD-10-CM | POA: Insufficient documentation

## 2023-03-26 DIAGNOSIS — R4182 Altered mental status, unspecified: Secondary | ICD-10-CM | POA: Diagnosis not present

## 2023-03-26 DIAGNOSIS — R4781 Slurred speech: Secondary | ICD-10-CM | POA: Diagnosis not present

## 2023-03-26 DIAGNOSIS — N3000 Acute cystitis without hematuria: Secondary | ICD-10-CM

## 2023-03-26 LAB — CBC
HCT: 49.3 % — ABNORMAL HIGH (ref 36.0–46.0)
Hemoglobin: 15.3 g/dL — ABNORMAL HIGH (ref 12.0–15.0)
MCH: 25.7 pg — ABNORMAL LOW (ref 26.0–34.0)
MCHC: 31 g/dL (ref 30.0–36.0)
MCV: 82.7 fL (ref 80.0–100.0)
Platelets: 280 10*3/uL (ref 150–400)
RBC: 5.96 MIL/uL — ABNORMAL HIGH (ref 3.87–5.11)
RDW: 13.8 % (ref 11.5–15.5)
WBC: 7.9 10*3/uL (ref 4.0–10.5)
nRBC: 0 % (ref 0.0–0.2)

## 2023-03-26 LAB — URINALYSIS, ROUTINE W REFLEX MICROSCOPIC
Bilirubin Urine: NEGATIVE
Glucose, UA: >=500 mg/dL — AB
Hgb urine dipstick: NEGATIVE
Ketones, ur: 20 mg/dL — AB
Nitrite: NEGATIVE
Protein, ur: 30 mg/dL — AB
Specific Gravity, Urine: 1.023 (ref 1.005–1.030)
pH: 5 (ref 5.0–8.0)

## 2023-03-26 LAB — RESP PANEL BY RT-PCR (RSV, FLU A&B, COVID)  RVPGX2
Influenza A by PCR: NEGATIVE
Influenza B by PCR: NEGATIVE
Resp Syncytial Virus by PCR: NEGATIVE
SARS Coronavirus 2 by RT PCR: NEGATIVE

## 2023-03-26 LAB — COMPREHENSIVE METABOLIC PANEL
ALT: 10 U/L (ref 0–44)
AST: 17 U/L (ref 15–41)
Albumin: 3.3 g/dL — ABNORMAL LOW (ref 3.5–5.0)
Alkaline Phosphatase: 50 U/L (ref 38–126)
Anion gap: 14 (ref 5–15)
BUN: 11 mg/dL (ref 8–23)
CO2: 26 mmol/L (ref 22–32)
Calcium: 9.4 mg/dL (ref 8.9–10.3)
Chloride: 103 mmol/L (ref 98–111)
Creatinine, Ser: 1.2 mg/dL — ABNORMAL HIGH (ref 0.44–1.00)
GFR, Estimated: 46 mL/min — ABNORMAL LOW (ref >60)
Glucose, Bld: 100 mg/dL — ABNORMAL HIGH (ref 70–99)
Potassium: 3.9 mmol/L (ref 3.5–5.1)
Sodium: 143 mmol/L (ref 135–145)
Total Bilirubin: 1.2 mg/dL (ref 0.0–1.2)
Total Protein: 7.6 g/dL (ref 6.5–8.1)

## 2023-03-26 LAB — CBG MONITORING, ED: Glucose-Capillary: 75 mg/dL (ref 70–99)

## 2023-03-26 NOTE — ED Notes (Signed)
 Unable to get labs ?

## 2023-03-26 NOTE — ED Provider Notes (Signed)
[place] [facility] AT [facility] Provider Note   CSN: [id number] Arrival date & time: 03/26/23  1316     History {Add pertinent medical, surgical, social history, OB history to HPI:1} Chief Complaint  Patient presents with   Altered Mental Status    Michelle Lopez is a 78 y.o. female.  78 yo F with hx of stroke, HTN, HLD, and DM who presented with confusion. Pt has a cough. Had diarrhea recently as well. Per daughter, [name] the home caregiver was saying that she couldn't understand their mother's speech. She was having a tough time understanding the paramedics. Does have poor hearing. LKW was 2200. Currently taking tamiflu for viral illness that other family member have. Has some LUE weakness from her prior stroke. Does also have a limp. No dx of dementia. Typically AAOx2 (sometimes know x3 and will know the year). Typically has her medications given to her. She still dresses herself.        Home Medications Prior to Admission medications   Medication Sig Start Date End Date Taking? Authorizing Provider  aspirin 81 MG tablet Take 81 mg by mouth daily.    [provider]  atorvastatin (LIPITOR) 20 MG tablet TAKE 1 TABLET BY MOUTH EVERY DAY 01/26/23   [name], MD  benzonatate (TESSALON PERLES) 100 MG capsule Take 1 capsule (100 mg total) by mouth 3 (three) times daily as needed for cough. 03/19/23   Moshe Cipro, FNP  Blood Glucose Monitoring Suppl (CONTOUR NEXT EZ) w/Device KIT 1 Act by Does not apply route 2 (two) times daily. 11/08/21   [name], MD  clopidogrel (PLAVIX) 75 MG tablet TAKE 1 TABLET BY MOUTH ONCE DAILY 01/26/23   [name], MD  Continuous Glucose Receiver (DEXCOM G7 RECEIVER) DEVI 1 Act by Does not apply route daily. 01/24/23   [name], MD  Continuous Glucose Sensor (DEXCOM G7 SENSOR) MISC 1 Act by Does not apply route daily. 01/24/23   [name], MD  Cyanocobalamin (VITAMIN B 12 PO)  Take by mouth.    [provider]  FARXIGA 10 MG TABS tablet TAKE 1 TABLET BY MOUTH ONCE DAILY 03/20/23   [name], MD  Glucagon (GVOKE HYPOPEN 2-PACK) 1 MG/0.2ML SOAJ Inject 1 Act into the skin daily as needed. 01/23/23   [name], MD  insulin glargine, 2 Unit Dial, (TOUJEO MAX SOLOSTAR) 300 UNIT/ML Solostar Pen Inject 10 Units into the skin daily. 02/12/23   [name], MD  metoprolol tartrate (LOPRESSOR) 25 MG tablet TAKE 1/2 TABLET BY MOUTH 2 TIMES DAILY 01/26/23   [name], MD  OneTouch UltraSoft 2 Lancets MISC USE TO CHECK BLOOD SUGAR 2 TIMES DAILY 04/24/22   [name], MD  tamsulosin (FLOMAX) 0.4 MG CAPS capsule TAKE 1 CAPSULE BY MOUTH EVERY MORNING 01/26/23   [name], MD  TRUEPLUS 5-BEVEL PEN NEEDLES 31G X 6 MM MISC USE TO INJECT insulin UP TO 5 TIMES DAILY 12/20/22   [name], MD  VITAMIN E PO Take by mouth.    [provider]      Allergies    Amlodipine, Crestor [rosuvastatin calcium], Morphine, Pravastatin sodium, and Simvastatin    Review of Systems   Review of Systems  Physical Exam Updated Vital Signs BP 122/65   Pulse 66   Temp 97.9 F (36.6 C)   Resp 17   SpO2 99%   Physical Exam Vitals and nursing  note reviewed.  Constitutional:      General: She is not in acute distress.    Appearance: She is well-developed.  HENT:     Head: Normocephalic and atraumatic.     Right Ear: External ear normal.     Left Ear: External ear normal.     Nose: Nose normal.  Eyes:     Extraocular Movements: Extraocular movements intact.     Conjunctiva/sclera: Conjunctivae normal.     Pupils: Pupils are equal, round, and reactive to light.  Cardiovascular:     Rate and Rhythm: Normal rate and regular rhythm.     Heart sounds: No murmur heard. Pulmonary:     Effort: Pulmonary effort is normal. No respiratory distress.     Breath sounds: Normal breath sounds.  Musculoskeletal:     Cervical back: Normal range of motion  and neck supple.     Right lower leg: No edema.     Left lower leg: No edema.  Skin:    General: Skin is warm and dry.  Neurological:     General: No focal deficit present.     Mental Status: She is alert.     Cranial Nerves: No cranial nerve deficit.     Sensory: No sensory deficit.     Motor: No weakness.  Psychiatric:        Mood and Affect: Mood normal.     ED Results / Procedures / Treatments   Labs (all labs ordered are listed, but only abnormal results are displayed) Labs Reviewed  RESP PANEL BY RT-PCR (RSV, FLU A&B, COVID)  RVPGX2  COMPREHENSIVE METABOLIC PANEL  CBC  CBG MONITORING, ED    EKG None  Radiology No results found.  Procedures Procedures  {Document cardiac monitor, telemetry assessment procedure when appropriate:1}  Medications Ordered in ED Medications - No data to display  ED Course/ Medical Decision Making/ A&P Clinical Course as of 03/26/23 1619  Mon Mar 26, 2023  1616 Attempted to contact Daughters Armando Reichert and Sue Lush without success. [RP]    Clinical Course User Index [RP] Rondel Baton, MD   {   Click here for ABCD2, HEART and other calculatorsREFRESH Note before signing :1}                              Medical Decision Making Amount and/or Complexity of Data Reviewed Labs: ordered. Radiology: ordered.   ***  {Document critical care time when appropriate:1} {Document review of labs and clinical decision tools ie heart score, Chads2Vasc2 etc:1}  {Document your independent review of radiology images, and any outside records:1} {Document your discussion with family members, caretakers, and with consultants:1} {Document social determinants of health affecting pt's care:1} {Document your decision making why or why not admission, treatments were needed:1} Final Clinical Impression(s) / ED Diagnoses Final diagnoses:  None    Rx / DC Orders ED Discharge Orders     None

## 2023-03-26 NOTE — ED Provider Triage Note (Signed)
Emergency Medicine Provider Triage Evaluation Note  Michelle Lopez , a 78 y.o. female with a history of stroke, hypertension, and diabetes was evaluated in triage.  Pt complains of confusion and fever.  Entire family is sick with flu.  Patient is currently taking Tamiflu.  When patient awoke this morning family reported that she was confused.  Went to bed at 2100 last night.  Review of Systems  Positive: See above Negative: Pain/headache  Physical Exam  BP 122/65   Pulse 66   Temp 97.9 F (36.6 C)   Resp 17   SpO2 99%  Gen:   Awake, no distress   Resp:  Normal effort.  Satting well on room air MSK:   Moves all 4 extremities without difficulty  Other:  No gross cranial nerve abnormalities.  No arm drift.  Bilateral lower extremities without drift.  Oriented to self and place but not year.  Medical Decision Making  Medically screening exam initiated at 1:58 PM.  Appropriate orders placed.  Michelle Lopez was informed that the remainder of the evaluation will be completed by another provider, this initial triage assessment does not replace that evaluation, and the importance of remaining in the ED until their evaluation is complete.  Presentation likely consistent with encephalopathy from influenza.  Given her age and confusion feel that she should be brought back to her room instead of going to the waiting room.  No focal deficits on my exam that would suggest LVO indicating a stroke activation     Rondel Baton, MD 03/26/23 1410

## 2023-03-26 NOTE — ED Triage Notes (Signed)
Pt arrives via EMS from home. EMS states entire family has had the flu. She is currently taking tamiflu. Family called EMS due to her waking up with AMS this morning. Last known well was 2100 last night.

## 2023-03-27 ENCOUNTER — Telehealth: Payer: Self-pay | Admitting: Internal Medicine

## 2023-03-27 ENCOUNTER — Emergency Department (HOSPITAL_COMMUNITY): Payer: 59

## 2023-03-27 DIAGNOSIS — R4182 Altered mental status, unspecified: Secondary | ICD-10-CM | POA: Diagnosis not present

## 2023-03-27 LAB — CBG MONITORING, ED: Glucose-Capillary: 98 mg/dL (ref 70–99)

## 2023-03-27 MED ORDER — SODIUM CHLORIDE 0.9 % IV BOLUS
500.0000 mL | Freq: Once | INTRAVENOUS | Status: AC
  Administered 2023-03-27: 500 mL via INTRAVENOUS

## 2023-03-27 MED ORDER — CEPHALEXIN 500 MG PO CAPS: 500.0000 mg | ORAL_CAPSULE | Freq: Four times a day (QID) | ORAL | 0 refills | Status: AC

## 2023-03-27 NOTE — ED Notes (Signed)
Called daughter, Armando Reichert, to find out details of patient's ride home. No answer, Will call back.

## 2023-03-27 NOTE — Progress Notes (Addendum)
1:15pm: CSW returned call to patient's daughter Armando Reichert who states she is on her way to pick up patient now.  10:15am: CSW spoke with patient's daughter Armando Reichert who states she will come pick her mom up after she finishes cleaning her room up in the home.  CSW informed RN.  Edwin Dada, MSW, LCSW Transitions of Care  Clinical Social Worker II [phone]

## 2023-03-27 NOTE — ED Provider Notes (Signed)
Care assumed at midnight.  Patient with history of prior CVA here for evaluation of altered mental status.  Household contacts have influenza and she is currently on Tamiflu.  Care assumed pending MRI.  MRI without acute abnormality.  On assessment at bedside patient is awake, alert and oriented to person, place and recent events.  She has fluent speech and no acute complaints.  MRI is negative for acute infarct.  CMP with mild elevation in her creatinine when compared to prior 1 week ago.  Possible mild dehydration.  Will treat with IV fluids. Discussed with patient's daughter Sue Lush over the phone studies performed and findings.  Feel she is stable for discharge home with outpatient follow-up and return precautions.   Tilden Fossa, MD 03/27/23 [phone]

## 2023-03-27 NOTE — ED Notes (Signed)
Dr. Rubin Payor at bedside assessing patient to ensure she is safe for discharge.

## 2023-03-27 NOTE — ED Provider Notes (Signed)
   ED Course / MDM   Clinical Course as of 03/27/23 0005  Mon Mar 26, 2023  1616 Attempted to contact Daughters Armando Reichert and Sue Lush without success. [RP]  1632 Received sign out from Dr. Eloise Harman, presenting with altered mental status, pending labs, CT head. No formal diagnosis of dementia but has some chronic confusion. Worse than normal. Home health aide concerned patient has trouble speaking and understanding. Patient denies any complaints, has had prior stroke [WS]  1633 Signed out to Dr Suezanne Jacquet.  [RP]  Tue Mar 27, 2023  0003 CT head negative.  Patient denies any complaints.  Speech remains a little slurred, otherwise, no evidence of cranial nerve deficit, weakness in the arms or leg.  Attempted to call the patient's daughter to help provide collateral information about whether her speech is at baseline or not, however there is no answer.  Given reported concern about speech difficulty and history of prior stroke will obtain MRI to further evaluate..  Signed out to oncoming provider pending MRI. [WS]    Clinical Course User Index [RP] Rondel Baton, MD [WS] Lonell Grandchild, MD   Medical Decision Making Amount and/or Complexity of Data Reviewed Labs: ordered. Radiology: ordered.         Lonell Grandchild, MD 03/27/23 Jorje Guild

## 2023-03-27 NOTE — ED Notes (Addendum)
Called by RN to come and see daughter who is here to pick up her mother. Pt was discharged early am and there was communication with the other daughter. There was communication to daughter Sue Lush and Shirlee Limerick is the daughter the patient lives with. There is no current POA on file and informed daughter Shirlee Limerick to bring in next time. SW has been in contact with daughter Shirlee Limerick this am since 1015 and then called again about picking up patient. Daughter has been trying to get home cleaned since she just got out of hospital being sick. Concerned that patient has not had daily medications administered today. Pt is a diabetic and only takes insulin in the am. Requested primary RN to request order to check CBG from provider here currently and it was 98. Primary RN has touched base with Dr. Wallace Cullens to re-check patient prior to discharge to ensure she is still safe for discharge. Pt was provided a sugar free cola.

## 2023-03-27 NOTE — ED Notes (Signed)
Pending Discharge : EDP notified family on discharge plan , family will arrange transport for patient .

## 2023-03-27 NOTE — Telephone Encounter (Signed)
 Copied from CRM [id number]. Topic: Referral - Request for Referral >> Mar 27, 2023  3:23 PM Michelle Lopez wrote: Did the patient discuss referral with their provider in the last year? per patient's daughter Michelle Lopez, yes. (I did offer appointment but she says her mom was just seen there). The daughter says this is something that can't wait and her mom is needing to be checked out ASAP. (If No - schedule appointment) (If Yes - send message)  Appointment offered? No  Type of order/referral and detailed reason for visit: Neurology, patient has been absent minded, they were looking for her and she was under the bed, also has been having slurred speech  Preference of office, provider, location: [facility] on [facility]   If referral order, have you been seen by this specialty before? A referral was placed back in 2016 to Dr. Everlena Cooper for vertigo (If Yes, this issue or another issue? When? Where?   Can we respond through MyChart? No

## 2023-03-27 NOTE — ED Notes (Signed)
 Patient transported to MRI

## 2023-03-27 NOTE — Discharge Planning (Signed)
RNCM met with pt and daughter, Armando Reichert at bedside.  Armando Reichert states POA paperwork should be in chart.  RNCM unable to locate; advised her to bring in to be scanned into system.

## 2023-03-27 NOTE — ED Notes (Signed)
 AVS with prescriptions provided to and discussed with patient and family member at bedside. Pt verbalizes understanding of discharge instructions and denies any questions or concerns at this time. Pt has ride home. Pt taken out of department via W/C.  ? ?

## 2023-03-27 NOTE — ED Notes (Signed)
Attempted to call both daughters listed as emergency contacts, no answer. Will call SW.

## 2023-03-27 NOTE — ED Provider Notes (Signed)
  Physical Exam  BP (!) 162/54 (BP Location: Left Arm)   Pulse (!) 57   Temp 98.1 F (36.7 C) (Oral)   Resp 12   SpO2 93%   Physical Exam  Procedures  Procedures  ED Course / MDM   Clinical Course as of 03/27/23 1603  Mon Mar 26, 2023  1616 Attempted to contact Daughters Armando Reichert and Sue Lush without success. [RP]  1632 Received sign out from Dr. Eloise Harman, presenting with altered mental status, pending labs, CT head. No formal diagnosis of dementia but has some chronic confusion. Worse than normal. Home health aide concerned patient has trouble speaking and understanding. Patient denies any complaints, has had prior stroke [WS]  1633 Signed out to Dr Suezanne Jacquet.  [RP]  Tue Mar 27, 2023  0003 CT head negative.  Patient denies any complaints.  Speech remains a little slurred, otherwise, no evidence of cranial nerve deficit, weakness in the arms or leg.  Attempted to call the patient's daughter to help provide collateral information about whether her speech is at baseline or not, however there is no answer.  Given reported concern about speech difficulty and history of prior stroke will obtain MRI to further evaluate..  Signed out to oncoming provider pending MRI. [WS]    Clinical Course User Index [RP] Rondel Baton, MD [WS] Lonell Grandchild, MD   Medical Decision Making Amount and/or Complexity of Data Reviewed Labs: ordered. Radiology: ordered.  Risk Prescription drug management.   Asked to see patient with daughter.  She is here now.  Worried that patient had not seen a doctor yet with her stay.  However I am the fourth doctor to see her.  Reviewed blood work and workup with patient's daughter.  MRI negative for stroke.  Creatinine mildly increased.  Although urine potentially could have infection.  No cultures been sent and patient cannot give no history about it.  Normal white count but with confusion will give 5-day treatment.  Will discharge home.       Benjiman Core, MD 03/27/23 [phone]

## 2023-03-27 NOTE — ED Notes (Signed)
Pt used bedpan with staff assist. Repositioned in bed. Tolerated well.

## 2023-03-28 ENCOUNTER — Other Ambulatory Visit: Payer: Self-pay | Admitting: Internal Medicine

## 2023-03-28 DIAGNOSIS — F0390 Unspecified dementia without behavioral disturbance: Secondary | ICD-10-CM

## 2023-03-28 NOTE — Telephone Encounter (Signed)
Place the referral please send message back to me once you do

## 2023-03-28 NOTE — Telephone Encounter (Signed)
 Unable to reach patient. LMTRC

## 2023-03-28 NOTE — Telephone Encounter (Signed)
Patients daughter wants her checked for dementia and alzheimer's she doesn't want palliative care or hospice.

## 2023-04-05 ENCOUNTER — Other Ambulatory Visit: Payer: Self-pay | Admitting: Internal Medicine

## 2023-04-05 ENCOUNTER — Encounter: Payer: Self-pay | Admitting: Physician Assistant

## 2023-04-05 DIAGNOSIS — H814 Vertigo of central origin: Secondary | ICD-10-CM

## 2023-04-05 DIAGNOSIS — I69391 Dysphagia following cerebral infarction: Secondary | ICD-10-CM

## 2023-04-05 DIAGNOSIS — F0390 Unspecified dementia without behavioral disturbance: Secondary | ICD-10-CM

## 2023-04-05 NOTE — Telephone Encounter (Signed)
 Can you please place the referral for neurology ?

## 2023-04-05 NOTE — Telephone Encounter (Signed)
 Can you also place her a referral to ENT at Dr. Eliane Decree office. She is now requesting this.

## 2023-04-05 NOTE — Telephone Encounter (Signed)
 Patient's daughter Armando Reichert would like a call back from [facility]. She is confused as to why Dr. Yetta Barre would want her mother to be referred to hospice or palliative care. Best callback is [phone].

## 2023-04-11 NOTE — Progress Notes (Signed)
 To test for infection

## 2023-04-19 ENCOUNTER — Encounter: Payer: Self-pay | Admitting: Physician Assistant

## 2023-04-19 ENCOUNTER — Ambulatory Visit (INDEPENDENT_AMBULATORY_CARE_PROVIDER_SITE_OTHER): Admitting: Physician Assistant

## 2023-04-19 ENCOUNTER — Other Ambulatory Visit: Payer: Self-pay | Admitting: Internal Medicine

## 2023-04-19 VITALS — BP 150/68 | HR 76 | Resp 20 | Ht 60.0 in | Wt 119.0 lb

## 2023-04-19 DIAGNOSIS — F01C0 Vascular dementia, severe, without behavioral disturbance, psychotic disturbance, mood disturbance, and anxiety: Secondary | ICD-10-CM

## 2023-04-19 DIAGNOSIS — I10 Essential (primary) hypertension: Secondary | ICD-10-CM

## 2023-04-19 DIAGNOSIS — E1121 Type 2 diabetes mellitus with diabetic nephropathy: Secondary | ICD-10-CM

## 2023-04-19 DIAGNOSIS — I739 Peripheral vascular disease, unspecified: Secondary | ICD-10-CM

## 2023-04-19 NOTE — Patient Instructions (Signed)
 It was a pleasure to see you today at our office.   Recommendations:   Agree with PT for strength and balance  Recommend 24/7 supervision, sitter versus HHN  Recommend visiting the website : " Dementia [facility]" to better understand some behaviors related to memory loss.    For psychiatric meds, mood meds: Please have your primary care physician manage these medications.  If you have any severe symptoms of a stroke, or other severe issues such as confusion,severe chills or fever, etc call 911 or go to the ER as you may need to be evaluated further   For guidance regarding [facility] and if placement were needed at the facility, contact Social Worker tel: [phone]  For assessment of decision of mental capacity and competency:  Call Dr. Erick Blinks, geriatric psychiatrist at [phone]  Counseling regarding caregiver distress, including caregiver depression, anxiety and issues regarding community resources, adult day care programs, adult living facilities, or memory care questions:  please contact your  Primary Doctor's Social Worker   Whom to call: Memory  decline, memory medications: Call our office [phone]    [email or web address]   RECOMMENDATIONS FOR ALL PATIENTS WITH MEMORY PROBLEMS: 1. Continue to exercise (Recommend 30 minutes of walking everyday, or 3 hours every week) 2. Increase social interactions - continue going to [place] and enjoy social gatherings with friends and family 3. Eat healthy, avoid fried foods and eat more fruits and vegetables 4. Maintain adequate blood pressure, blood sugar, and blood cholesterol level. Reducing the risk of stroke and cardiovascular disease also helps promoting better memory. 5. Avoid stressful situations. Live a simple life and avoid aggravations. Organize your time and prepare for the next day in anticipation. 6. Sleep well, avoid any interruptions of  sleep and avoid any distractions in the bedroom that may interfere with adequate sleep quality 7. Avoid sugar, avoid sweets as there is a strong link between excessive sugar intake, diabetes, and cognitive impairment We discussed the Mediterranean diet, which has been shown to help patients reduce the risk of progressive memory disorders and reduces cardiovascular risk. This includes eating fish, eat fruits and green leafy vegetables, nuts like almonds and hazelnuts, walnuts, and also use olive oil. Avoid fast foods and fried foods as much as possible. Avoid sweets and sugar as sugar use has been linked to worsening of memory function.  There is always a concern of gradual progression of memory problems. If this is the case, then we may need to adjust level of care according to patient needs. Support, both to the patient and caregiver, should then be put into place.      FALL PRECAUTIONS: Be cautious when walking. Scan the area for obstacles that may increase the risk of trips and falls. When getting up in the mornings, sit up at the edge of the bed for a few minutes before getting out of bed. Consider elevating the bed at the head end to avoid drop of blood pressure when getting up. Walk always in a well-lit room (use night lights in the walls). Avoid area rugs or power cords from appliances in the middle of the walkways. Use a walker or a cane if necessary and consider physical therapy for balance exercise. Get your eyesight checked regularly.  FINANCIAL OVERSIGHT: Supervision, especially oversight when making financial decisions or transactions is also recommended.  HOME SAFETY: Consider the safety of the kitchen when operating appliances like stoves, microwave oven, and blender. Consider having supervision and share cooking responsibilities  until no longer able to participate in those. Accidents with firearms and other hazards in the house should be identified and addressed as well.   ABILITY TO  BE LEFT ALONE: If patient is unable to contact 911 operator, consider using LifeLine, or when the need is there, arrange for someone to stay with patients. Smoking is a fire hazard, consider supervision or cessation. Risk of wandering should be assessed by caregiver and if detected at any point, supervision and safe proof recommendations should be instituted.  MEDICATION SUPERVISION: Inability to self-administer medication needs to be constantly addressed. Implement a mechanism to ensure safe administration of the medications.      Mediterranean Diet A Mediterranean diet refers to food and lifestyle choices that are based on the traditions of countries located on the [employer] [place]. This way of eating has been shown to help prevent certain conditions and improve outcomes for people who have chronic diseases, like kidney disease and heart disease. What are tips for following this plan? Lifestyle  Cook and eat meals together with your family, when possible. Drink enough fluid to keep your urine clear or pale yellow. Be physically active every day. This includes: Aerobic exercise like running or swimming. Leisure activities like gardening, walking, or housework. Get 7-8 hours of sleep each night. If recommended by your health care provider, drink red wine in moderation. This means 1 glass a day for nonpregnant women and 2 glasses a day for men. A glass of wine equals 5 oz (150 mL). Reading food labels  Check the serving size of packaged foods. For foods such as rice and pasta, the serving size refers to the amount of cooked product, not dry. Check the total fat in packaged foods. Avoid foods that have saturated fat or trans fats. Check the ingredients list for added sugars, such as corn syrup. Shopping  At the grocery store, buy most of your food from the areas near the walls of the store. This includes: Fresh fruits and vegetables (produce). Grains, beans, nuts, and seeds. Some of these  may be available in unpackaged forms or large amounts (in bulk). Fresh seafood. Poultry and eggs. Low-fat dairy products. Buy whole ingredients instead of prepackaged foods. Buy fresh fruits and vegetables in-season from local farmers markets. Buy frozen fruits and vegetables in resealable bags. If you do not have access to quality fresh seafood, buy precooked frozen shrimp or canned fish, such as tuna, salmon, or sardines. Buy small amounts of raw or cooked vegetables, salads, or olives from the deli or salad bar at your store. Stock your pantry so you always have certain foods on hand, such as olive oil, canned tuna, canned tomatoes, rice, pasta, and beans. Cooking  Cook foods with extra-virgin olive oil instead of using butter or other vegetable oils. Have meat as a side dish, and have vegetables or grains as your main dish. This means having meat in small portions or adding small amounts of meat to foods like pasta or stew. Use beans or vegetables instead of meat in common dishes like chili or lasagna. Experiment with different cooking methods. Try roasting or broiling vegetables instead of steaming or sauteing them. Add frozen vegetables to soups, stews, pasta, or rice. Add nuts or seeds for added healthy fat at each meal. You can add these to yogurt, salads, or vegetable dishes. Marinate fish or vegetables using olive oil, lemon juice, garlic, and fresh herbs. Meal planning  Plan to eat 1 vegetarian meal one day each week. Try to  work up to 2 vegetarian meals, if possible. Eat seafood 2 or more times a week. Have healthy snacks readily available, such as: Vegetable sticks with hummus. Greek yogurt. Fruit and nut trail mix. Eat balanced meals throughout the week. This includes: Fruit: 2-3 servings a day Vegetables: 4-5 servings a day Low-fat dairy: 2 servings a day Fish, poultry, or lean meat: 1 serving a day Beans and legumes: 2 or more servings a week Nuts and seeds: 1-2  servings a day Whole grains: 6-8 servings a day Extra-virgin olive oil: 3-4 servings a day Limit red meat and sweets to only a few servings a month What are my food choices? Mediterranean diet Recommended Grains: Whole-grain pasta. Brown rice. Bulgar wheat. Polenta. Couscous. Whole-wheat bread. Orpah Cobb. Vegetables: Artichokes. Beets. Broccoli. Cabbage. Carrots. Eggplant. Green beans. Chard. Kale. Spinach. Onions. Leeks. Peas. Squash. Tomatoes. Peppers. Radishes. Fruits: Apples. Apricots. Avocado. Berries. Bananas. Cherries. Dates. Figs. Grapes. Lemons. Melon. Oranges. Peaches. Plums. Pomegranate. Meats and other protein foods: Beans. Almonds. Sunflower seeds. Pine nuts. Peanuts. Cod. Salmon. Scallops. Shrimp. Tuna. Tilapia. Clams. Oysters. Eggs. Dairy: Low-fat milk. Cheese. Greek yogurt. Beverages: Water. Red wine. Herbal tea. Fats and oils: Extra virgin olive oil. Avocado oil. Grape seed oil. Sweets and desserts: [place] yogurt with honey. Baked apples. Poached pears. Trail mix. Seasoning and other foods: Basil. Cilantro. Coriander. Cumin. Mint. Parsley. Sage. Rosemary. Tarragon. Garlic. Oregano. Thyme. Pepper. Balsalmic vinegar. Tahini. Hummus. Tomato sauce. Olives. Mushrooms. Limit these Grains: Prepackaged pasta or rice dishes. Prepackaged cereal with added sugar. Vegetables: Deep fried potatoes (french fries). Fruits: Fruit canned in syrup. Meats and other protein foods: Beef. Pork. Lamb. Poultry with skin. Hot dogs. Tomasa Blase. Dairy: Ice cream. Sour cream. Whole milk. Beverages: Juice. Sugar-sweetened soft drinks. Beer. Liquor and spirits. Fats and oils: Butter. Canola oil. Vegetable oil. Beef fat (tallow). Lard. Sweets and desserts: Cookies. Cakes. Pies. Candy. Seasoning and other foods: Mayonnaise. Premade sauces and marinades. The items listed may not be a complete list. Talk with your dietitian about what dietary choices are right for you. Summary The Mediterranean diet  includes both food and lifestyle choices. Eat a variety of fresh fruits and vegetables, beans, nuts, seeds, and whole grains. Limit the amount of red meat and sweets that you eat. Talk with your health care provider about whether it is safe for you to drink red wine in moderation. This means 1 glass a day for nonpregnant women and 2 glasses a day for men. A glass of wine equals 5 oz (150 mL). This information is not intended to replace advice given to you by your health care provider. Make sure you discuss any questions you have with your health care provider. Document Released: 09/16/2015 Document Revised: 10/19/2015 Document Reviewed: 09/16/2015 Elsevier Interactive Patient Education  2017 [employer].

## 2023-04-19 NOTE — Progress Notes (Addendum)
 Assessment/Plan:     Michelle Lopez is a very pleasant 78 y.o. year old RH female with a history of hypertension, hyperlipidemia, carotid artery disease, prior CVA with residual dysarthria, LE weakness, extensive vascular disease dating back to 2007,  "chronic confusion ", HOH  seen today for evaluation of memory loss.  Recent MRI brain February 2025, personally reviewed without acute findings, chronic remote extensive infarcts of the left cerebellum and brainstem, severe chronic microvascular disease, remote lacunar infarct left basal ganglia, right corona radiata (2010), moderate cerebral and mild hippocampal atrophy noted, chronic occlusion of the left vertebral artery (present in an MRI from 2015).  Unfortunately, her MMSE was 0/30.  She was unable to comprehend instructions.  In view of her advanced dementia, suspect that is likely due to vascular disease, no antidementia medication will be indicated.  Patient is able to participate in basic ADLs.  Daughter is experiencing significant caregiver distress, and she is to meet with her PCP is Child psychotherapist to discuss home health nursing, as the patient will need 24/7 supervision for safety.   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   Given advanced disease, no antidementia medication is indicated as this is no longer therapeutic, and the risks of the meds outweigh the benefits of it Agree with [facility] for 24/7 supervision for safety Recommend good control of cardiovascular risk factors. Continue Plavix and ASA as per cardiology Continue to control mood as per PCP Recommend PT for strength and balance.  Recommend hearing aids to improve hearing. No follow-up is indicated given the irreversibility of the symptoms and advanced dementia She may benefit from memory care facility.    Subjective:    The patient is accompanied by her daughter  who supplements the history.    How long did patient have memory difficulties? "I don't see it because I  live with her, but maybe 2020"-daughter says.  Progressively, she had increasing issues with comprehending instructions, remembering basic tasks, conversations, names, etc.  "She leaves stuff at the table and forget, I think she does it purposely"-daughter says. repeats oneself? Denies  Disoriented when walking into a room?  Patient denies    Leaving objects in unusual places?  Denies.    Wandering behavior? Denies.   Any personality changes, or depression, anxiety? Denies   Hallucinations or paranoia? denies   Seizures? denies    Any sleep changes?  Sleeps well , "she wants to sleep all day"-daughter says. She denies vivid dreams, REM behavior or sleepwalking   Sleep apnea? Denies.   Any hygiene concerns?  Needs to be reminded to brush her teeth and bathe. Independent of bathing and dressing? Endorsed  Does the patient need help with medications?  Daughter is in charge   Who is in charge of the finances?  Daughter is in charge     Any changes in appetite?  "Very well" . Patient have trouble swallowing?  Denies.  Does the patient cook? No  Any headaches?  Denies.   Chronic pain? Denies.   Ambulates with difficulty? Denies, but carries a cane "like a pocketbook"-daughter says. .  Recent falls or head injuries?  Denies although the other day "she was under the bed, but does not appear that she had fallen, she has been and remember how to get out from under the bed".     Vision changes?  Denies any new issues.  Any strokelike symptoms?  Denies any acute symptoms.  She has chronic arm and leg weakness, R facial  drooping, chronic.  Any tremors? Denies.   Any anosmia? Denies.   Any incontinence of urine? Endorsed, wears diapers. Prone to UTIs.  Any bowel dysfunction? Denies  Patient lives with daughter and her grandson (temporarily) History of heavy alcohol intake? Denies.   History of heavy tobacco use? Denies.   Family history of dementia?  Her mother had with dementia, vascular. Does  patient drive? No    Allergies  Allergen Reactions   Amlodipine Other (See Comments)    Ankle edema   Crestor [Rosuvastatin Calcium]     myalgias   Morphine Hives   Pravastatin Sodium Rash   Simvastatin Rash    Current Outpatient Medications  Medication Instructions   aspirin 81 mg, Daily   atorvastatin (LIPITOR) 20 mg, Oral, Daily   benzonatate (TESSALON PERLES) 100 mg, Oral, 3 times daily PRN   Blood Glucose Monitoring Suppl (CONTOUR NEXT EZ) w/Device KIT 1 Act, Does not apply, 2 times daily   cholecalciferol (VITAMIN D3) 2,000 Units, Daily   clopidogrel (PLAVIX) 75 mg, Oral, Daily   Continuous Glucose Receiver (DEXCOM G7 RECEIVER) DEVI 1 Act, Does not apply, Daily   Continuous Glucose Sensor (DEXCOM G7 SENSOR) MISC 1 Act, Does not apply, Daily   Cyanocobalamin (VITAMIN B 12 PO) Take by mouth.   Farxiga 10 mg, Oral, Daily   Glucagon (GVOKE HYPOPEN 2-PACK) 1 MG/0.2ML SOAJ 1 Act, Subcutaneous, Daily PRN   metoprolol tartrate (LOPRESSOR) 25 MG tablet TAKE 1/2 TABLET BY MOUTH 2 TIMES DAILY   OneTouch UltraSoft 2 Lancets MISC USE TO CHECK BLOOD SUGAR 2 TIMES DAILY   tamsulosin (FLOMAX) 0.4 mg, Oral, Every morning   Toujeo Max SoloStar 10 Units, Subcutaneous, Daily   TRUEPLUS 5-BEVEL PEN NEEDLES 31G X 6 MM MISC USE TO INJECT insulin UP TO 5 TIMES DAILY     VITALS:   Vitals:   04/19/23 0941 04/19/23 0956  BP: (!) 149/68 (!) 150/68  Pulse: 76   Resp: 20   SpO2: 95%   Weight: 119 lb (54 kg)   Height: 5' (1.524 m)       PHYSICAL EXAM   HEENT:  Normocephalic, atraumatic.  The superficial temporal arteries are without ropiness or tenderness. Cardiovascular: Regular rate and rhythm. Lungs: Clear to auscultation bilaterally. Neck: There are no carotid bruits noted bilaterally.  NEUROLOGICAL:     No data to display             04/19/2023   12:00 PM 08/27/2018    9:55 AM  MMSE - Mini Mental State Exam  Not completed: -- --     Orientation:  Alert and not oriented  to person, place and not to time. No aphasia or dysarthria. Fund of knowledge is reduced.  Recent and remote memory impaired.  Attention and concentration are reduced.  Able to name objects and repeat phrases. Delayed recall 0/3  cranial nerves: There is good facial symmetry. Extraocular muscles are intact and visual fields are full to confrontational testing. Speech is fluent and clear. No tongue deviation. Hearing is decreased to conversational tone L>R. Tone: Tone is good throughout. Sensation: Sensation is intact to light touch.  Vibration is intact at the bilateral big toe.  Coordination: The patient has some difficulty with RAM's or FNF bilaterally.  Abnormal  finger to nose  Motor: Strength is 5/5 in the bilateral upper and lower extremities. There is no pronator drift. There are no fasciculations noted. DTR's: Deep tendon reflexes are 2/4 bilaterally. Gait and Station: The patient is  able to ambulate without difficulty, but slow steps. Gait is cautious and narrow. Stride length is short       Thank you for allowing [place] the opportunity to participate in the care of this nice patient. Please do not hesitate to contact us for any questions or concerns.   Total time spent on today's visit was 54 minutes dedicated to this patient today, preparing to see patient, examining the patient, ordering tests and/or medications and counseling the patient, documenting clinical information in the EHR or other health record, independently interpreting results and communicating results to the patient/family, discussing treatment and goals, answering patient's questions and coordinating care.  Cc:  [name], MD  Marlowe Kays 04/19/2023 12:14 PM

## 2023-04-25 ENCOUNTER — Encounter

## 2023-05-07 ENCOUNTER — Ambulatory Visit (INDEPENDENT_AMBULATORY_CARE_PROVIDER_SITE_OTHER): Admitting: Podiatry

## 2023-05-07 ENCOUNTER — Other Ambulatory Visit: Payer: Self-pay | Admitting: Podiatry

## 2023-05-07 ENCOUNTER — Ambulatory Visit (INDEPENDENT_AMBULATORY_CARE_PROVIDER_SITE_OTHER)

## 2023-05-07 DIAGNOSIS — G5762 Lesion of plantar nerve, left lower limb: Secondary | ICD-10-CM | POA: Diagnosis not present

## 2023-05-07 DIAGNOSIS — M84375D Stress fracture, left foot, subsequent encounter for fracture with routine healing: Secondary | ICD-10-CM | POA: Diagnosis not present

## 2023-05-07 DIAGNOSIS — M79672 Pain in left foot: Secondary | ICD-10-CM | POA: Diagnosis not present

## 2023-05-07 DIAGNOSIS — M79671 Pain in right foot: Secondary | ICD-10-CM

## 2023-05-07 NOTE — Progress Notes (Unsigned)
 Chief Complaint  Patient presents with   Foot Pain    Left foot pain    HPI: 78 y.o. female presenting today with c/o pain, burning/tingling, noted near the left second interspace.  Patient describes pain on the ball of the foot when standing.  Feels like "something" is on the bottom of the foot when walking, but nothing is actually there.  Denies injury.  She also has history of stress fractures in some of the metatarsal bones on the same foot.  She has a newer pair of shoes that have a mesh material and states that they do not put extra pressure from side-to-side.  She typically sees Dr. Donzetta Matters for routine footcare  Past Medical History:  Diagnosis Date   Cerebrovascular disease, unspecified    Cocaine abuse, unspecified    Diabetes mellitus    type 2, uncontrolled   Diarrhea    Essential hypertension, benign    Hyperlipidemia    Stroke (HCC)    x7    Past Surgical History:  Procedure Laterality Date   TOTAL HIP ARTHROPLASTY  1970   Dr. Annell Greening    Allergies  Allergen Reactions   Amlodipine Other (See Comments)    Ankle edema   Crestor [Rosuvastatin Calcium]     myalgias   Morphine Hives   Pravastatin Sodium Rash   Simvastatin Rash    PHYSICAL EXAM: General: The patient is alert and oriented x3 in no acute distress.  Dermatology: Skin is warm, dry and supple bilateral lower extremities. Interspaces are clear of maceration and debris.    Vascular: Palpable pedal pulses bilaterally. Capillary refill within normal limits.  No appreciable edema.  No erythema or calor.  Neurological: Light touch sensation grossly intact bilateral feet.   Musculoskeletal Exam: No pedal deformities noted.  No pain on palpation of the metatarsals.  No pain with ROM of the MPJ's.  There is a positive Muldor's sign with compression of the left second interspace.  There is pain with compression of the left second interspace.  No pain on palpation to the metatarsal bones  RADIOGRAPHIC  EXAM (left foot, 3 weightbearing views, 05/07/2023):  Normal osseous mineralization.  Joint space narrowing at the first MPJ.  Significant increase in first intermetatarsal angle and hallux abductus angle.  Tibial sesamoid position is 6.  Evidence of previous metatarsal neck fractures which show adequate bone healing on x-ray today.  No new fractures seen.  Contractures of toes 2 through 5 with medial angulation of toes 4 and 5  ASSESSMENT / PLAN OF CARE: 1. Neuroma of second interspace of left foot   2. Stress fracture of metatarsal bone of left foot with routine healing, subsequent encounter    Discussed patient's condition today and possible etiologies.  Discussed conservative treatment options, including off-loading, shoe modifications, cortisone injection, NSAID use, and possible surgery if conservative options fail.  With the patient's consent, a corticosteroid injection was administered to the left second interspace consisting of a mixture of 1% Xylocaine plain, 0.5% Marcaine plain, and Kenalog 10 for total of 1.5 cc administered.  A Band-Aid was applied.  She tolerated this well.  Discussed shoe fit with the patient today to make sure there is no side-to-side increased pressure.  Informed the patient that the stress fractures have healed  Follow-up with Dr. Donzetta Matters as scheduled  Deem Marmol D. Malon Branton, DPM, FACFAS [facility]     [address]  [place], [place] [address]                Office [phone]  Fax [phone]

## 2023-05-08 ENCOUNTER — Ambulatory Visit (INDEPENDENT_AMBULATORY_CARE_PROVIDER_SITE_OTHER): Admitting: Podiatry

## 2023-05-08 ENCOUNTER — Encounter: Payer: Self-pay | Admitting: Podiatry

## 2023-05-08 VITALS — Ht 60.0 in | Wt 119.0 lb

## 2023-05-08 DIAGNOSIS — M79674 Pain in right toe(s): Secondary | ICD-10-CM | POA: Diagnosis not present

## 2023-05-08 DIAGNOSIS — M2041 Other hammer toe(s) (acquired), right foot: Secondary | ICD-10-CM | POA: Diagnosis not present

## 2023-05-08 DIAGNOSIS — M79675 Pain in left toe(s): Secondary | ICD-10-CM | POA: Diagnosis not present

## 2023-05-08 DIAGNOSIS — M2042 Other hammer toe(s) (acquired), left foot: Secondary | ICD-10-CM

## 2023-05-08 DIAGNOSIS — E119 Type 2 diabetes mellitus without complications: Secondary | ICD-10-CM | POA: Diagnosis not present

## 2023-05-08 DIAGNOSIS — E1151 Type 2 diabetes mellitus with diabetic peripheral angiopathy without gangrene: Secondary | ICD-10-CM | POA: Diagnosis not present

## 2023-05-08 DIAGNOSIS — B351 Tinea unguium: Secondary | ICD-10-CM

## 2023-05-13 NOTE — Progress Notes (Signed)
 ANNUAL DIABETIC FOOT EXAM  Subjective: Michelle Lopez presents today for annual diabetic foot exam. She is accompanied by her daughter on today's visit. Daughter states Mom had injection of left foot and review of visit reveals steroid injection for neuroma 2nd webspace left foot. Chief Complaint  Patient presents with   [facility]    She is here for a diabetic nail trim, (daughter with her as she has cognitive impairment, Last A1C in the system was 6.2. PCP is thomas jones.    Patient confirms h/o diabetes.  Patient denies any h/o foot wounds.  She is accompanied by her daughter on today's visit.   Patient has been diagnosed with neuropathy.  [name], MD is patient's PCP.  Past Medical History:  Diagnosis Date   Cerebrovascular disease, unspecified    Cocaine abuse, unspecified    Diabetes mellitus    type 2, uncontrolled   Diarrhea    Essential hypertension, benign    Hyperlipidemia    Stroke [facility])    x7   Patient Active Problem List   Diagnosis Date Noted   Insulin-requiring or dependent type II diabetes mellitus (HCC) 02/13/2023   Essential hypertension, benign 11/12/2022   Yeast vaginitis 01/07/2022   Need for prophylactic vaccination and inoculation against varicella 06/13/2021   Need for prophylactic vaccination with combined diphtheria-tetanus-pertussis (DTP) vaccine 06/13/2021   Bilateral hearing loss 05/02/2021   Stage 3b chronic kidney disease (HCC) 04/14/2020   Tinea corporis 12/17/2018   Protein-calorie malnutrition, mild (HCC) 10/17/2017   B12 deficiency 08/23/2017   Primary osteoarthritis of right hip 04/03/2017   PAD (peripheral artery disease) (HCC) 07/24/2016   Vitamin D deficiency 07/06/2015   Osteopenia, senile 03/03/2014   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 Annell Greening   Current Outpatient Medications on File Prior to Visit  Medication Sig Dispense Refill   aspirin 81 MG tablet Take 81 mg by mouth daily.     atorvastatin (LIPITOR) 20 MG tablet TAKE 1 TABLET BY MOUTH EVERY DAY 90 tablet 0   benzonatate (TESSALON PERLES) 100 MG capsule Take 1 capsule (100 mg total) by mouth 3 (three) times daily as needed for cough. 20 capsule 0   Blood Glucose Monitoring Suppl (CONTOUR NEXT EZ) w/Device KIT 1 Act by Does not apply route 2 (two) times daily. 1 kit 3   cholecalciferol (VITAMIN D3) 25 MCG (1000 UNIT) tablet Take 2,000 Units by mouth daily.     clopidogrel (PLAVIX) 75 MG tablet TAKE 1 TABLET BY MOUTH ONCE DAILY 90 tablet 0   Continuous Glucose Receiver (DEXCOM G7 RECEIVER) DEVI 1 Act by Does not apply route daily. 9 each 1   Continuous Glucose Sensor (DEXCOM G7 SENSOR) MISC 1 Act by Does not apply route daily. 9 each 1   Cyanocobalamin (VITAMIN B 12 PO) Take by mouth.     FARXIGA 10 MG TABS tablet TAKE 1 TABLET BY MOUTH ONCE DAILY 90 tablet 0   Glucagon (GVOKE  HYPOPEN 2-PACK) 1 MG/0.2ML SOAJ Inject 1 Act into the skin daily as needed. 2 mL 5   insulin glargine, 2 Unit Dial, (TOUJEO MAX SOLOSTAR) 300 UNIT/ML Solostar Pen Inject 10 Units into the skin daily. 6 mL 0   metoprolol tartrate (LOPRESSOR) 25 MG tablet TAKE 1/2 TABLET BY MOUTH 2 TIMES DAILY 90 tablet 0   OneTouch UltraSoft 2 Lancets MISC USE TO CHECK BLOOD SUGAR 2 TIMES DAILY 300 each 1   tamsulosin (FLOMAX) 0.4 MG CAPS capsule TAKE 1 CAPSULE BY MOUTH EVERY MORNING 90 capsule 0   TRUEPLUS 5-BEVEL PEN NEEDLES 31G X 6 MM MISC USE TO INJECT insulin UP TO 5  TIMES DAILY 300 each 1   No current facility-administered medications on file prior to visit.    Allergies  Allergen Reactions   Amlodipine Other (See Comments)    Ankle edema   Crestor [Rosuvastatin Calcium]     myalgias   Morphine Hives   Pravastatin Sodium Rash   Simvastatin Rash   Social History   Occupational History   Occupation: Disability  Tobacco Use   Smoking status: Never   Smokeless tobacco: Never  Vaping Use   Vaping status: Never Used  Substance and Sexual Activity   Alcohol use: No    Alcohol/week: 0.0 standard drinks of alcohol   Drug use: No   Sexual activity: Not Currently   Family History  Problem Relation Age of Onset   Colon cancer Neg Hx    Immunization History  Administered Date(s) Administered   Fluad Quad(high Dose 65+) 01/07/2019, 11/08/2021   Fluad Trivalent(High Dose 65+) 01/23/2023   Influenza Whole 01/14/2009, 11/16/2009   Influenza, High Dose Seasonal PF 12/14/2014, 11/27/2016, 10/17/2017   Influenza,inj,Quad PF,6+ Mos 10/31/2013   Influenza-Unspecified 12/07/2012   PFIZER(Purple Top)SARS-COV-2 Vaccination 09/04/2019, 10/07/2019, 06/14/2020   Pneumococcal Conjugate-13 06/23/2014   Pneumococcal Polysaccharide-23 12/24/2008, 08/23/2017   Td 11/16/2009   Zoster, Live 10/03/2012     Review of Systems: Negative except as noted in the HPI.   Objective: There were no vitals filed for this visit.  Michelle Lopez is a pleasant 78 y.o. female in NAD. AAO X 3.  Diabetic foot exam was performed with the following findings:   Vascular Examination: CFT <3 seconds b/l. DP/PT pulses faintly palpable. Digital hair absent. Skin temperature gradient warm to warm b/l. No ischemia or gangrene. No cyanosis or clubbing noted b/l. Trace edema noted BLE.   Neurological Examination: Sensation grossly intact b/l with 10 gram monofilament. Vibratory sensation intact b/l.   Dermatological Examination: Pedal skin thin, shiny and atrophic b/l. No open  wounds. No interdigital macerations.   Toenails 1-5 b/l thick, discolored, elongated with subungual debris and pain on dorsal palpation.   No corns, calluses nor porokeratotic lesions noted.  Musculoskeletal Examination: Muscle strength 4/5 to all lower extremity muscle groups bilaterally. HAV with bunion deformity noted b/l LE. Hammertoe(s) L 2nd toe and R 2nd toe.  Radiographs: None     Lab Results  Component Value Date   HGBA1C 6.2 01/23/2023   ADA Risk Categorization: High Risk  Patient has one or more of the following: Loss of protective sensation Absent pedal pulses Severe Foot deformity History of foot ulcer  Assessment: 1. Pain due to onychomycosis of toenails of both feet   2. Acquired hammertoes of both feet   3. Type II diabetes mellitus with peripheral circulatory disorder (HCC)   4. Encounter for diabetic foot exam (HCC)     Plan: Diabetic foot examination performed  today. All patient's and/or POA's questions/concerns addressed on today's visit. Toenails 1-5 debrided in length and girth without incident. Continue foot and shoe inspections daily. Monitor blood glucose per PCP/Endocrinologist's recommendations. Continue soft, supportive shoe gear daily. Report any pedal injuries to medical professional. Call office if there are any questions/concerns. -Patient/POA to call should there be question/concern in the interim. Return in about 3 months (around 08/07/2023).  Freddie Breech, DPM      [facility] LOCATION: [address] [place] [address]                   Office [phone]   [facility] LOCATION: [address] [place], [place] [address] Office [phone]

## 2023-05-14 ENCOUNTER — Encounter: Payer: Self-pay | Admitting: Physician Assistant

## 2023-06-12 ENCOUNTER — Ambulatory Visit: Payer: 59 | Admitting: Internal Medicine

## 2023-06-13 ENCOUNTER — Ambulatory Visit: Payer: 59 | Admitting: Podiatry

## 2023-06-14 ENCOUNTER — Encounter: Admitting: Family Medicine

## 2023-06-18 ENCOUNTER — Telehealth: Payer: Self-pay | Admitting: Internal Medicine

## 2023-06-18 NOTE — Telephone Encounter (Signed)
 Copied from CRM [id number]. Topic: General - Other >> Jun 18, 2023 11:08 AM Howard Macho wrote: Reason for CRM: stephanie from [facility] called wanted to verify the patient and get the last visit the patient had with the doctor CB [phone]

## 2023-06-19 NOTE — Telephone Encounter (Signed)
 I have confirmed that the patient Is still being followed by Dr. Rochelle Chu and we have seen her within the last 6 months. Will be on the lookout for the paperwork to be signed

## 2023-06-20 ENCOUNTER — Other Ambulatory Visit: Payer: Self-pay | Admitting: Internal Medicine

## 2023-06-20 DIAGNOSIS — N1832 Chronic kidney disease, stage 3b: Secondary | ICD-10-CM

## 2023-06-20 DIAGNOSIS — E1121 Type 2 diabetes mellitus with diabetic nephropathy: Secondary | ICD-10-CM

## 2023-07-06 ENCOUNTER — Ambulatory Visit

## 2023-07-13 ENCOUNTER — Other Ambulatory Visit: Payer: Self-pay | Admitting: Internal Medicine

## 2023-07-13 DIAGNOSIS — I739 Peripheral vascular disease, unspecified: Secondary | ICD-10-CM

## 2023-07-13 DIAGNOSIS — E1121 Type 2 diabetes mellitus with diabetic nephropathy: Secondary | ICD-10-CM

## 2023-07-13 DIAGNOSIS — I10 Essential (primary) hypertension: Secondary | ICD-10-CM

## 2023-07-17 ENCOUNTER — Ambulatory Visit

## 2023-07-18 ENCOUNTER — Ambulatory Visit

## 2023-07-23 ENCOUNTER — Ambulatory Visit (INDEPENDENT_AMBULATORY_CARE_PROVIDER_SITE_OTHER): Admitting: Otolaryngology

## 2023-07-23 ENCOUNTER — Encounter (INDEPENDENT_AMBULATORY_CARE_PROVIDER_SITE_OTHER): Payer: Self-pay | Admitting: Otolaryngology

## 2023-07-23 VITALS — BP 144/69 | HR 76 | Ht 63.0 in | Wt 130.0 lb

## 2023-07-23 DIAGNOSIS — H6123 Impacted cerumen, bilateral: Secondary | ICD-10-CM

## 2023-07-23 DIAGNOSIS — R42 Dizziness and giddiness: Secondary | ICD-10-CM

## 2023-07-24 DIAGNOSIS — H6123 Impacted cerumen, bilateral: Secondary | ICD-10-CM | POA: Insufficient documentation

## 2023-07-24 NOTE — Progress Notes (Signed)
 CC: Recurrent dizziness  HPI:  Michelle Lopez is a 78 y.o. female who presents today with her daughter.  According to the daughter, the patient has been experiencing frequent recurrent dizziness for many years.  The patient describes her dizziness as a lightheaded and off-balance sensation.  Each episode typically last for several minutes.  The patient has been symptomatic on a daily basis.  Her dizziness is often triggered with sudden movement, especially when she gets up from a supine position.  According to the daughter, the patient is severely deconditioned.  She was diagnosed with vascular dementia.  The patient is often bedridden and does not ambulate much.  She is steadily losing weight over the past 3 months.  She has a history of cerebrovascular accident.  The patient denies any otalgia, otorrhea, or spinning vertigo.  She has a history of bilateral hearing loss.  She has never worn hearing aids.  Past Medical History:  Diagnosis Date   Cerebrovascular disease, unspecified    Cocaine abuse, unspecified    Diabetes mellitus    type 2, uncontrolled   Diarrhea    Essential hypertension, benign    Hyperlipidemia    Stroke (HCC)    x7    Past Surgical History:  Procedure Laterality Date   TOTAL HIP ARTHROPLASTY  1970   Dr. Tommy Frames    Family History  Problem Relation Age of Onset   Colon cancer Neg Hx     Social History:  reports that she has never smoked. She has never used smokeless tobacco. She reports that she does not drink alcohol  and does not use drugs.  Allergies:  Allergies  Allergen Reactions   Amlodipine  Other (See Comments)    Ankle edema   Crestor  [Rosuvastatin  Calcium ]     myalgias   Morphine  Hives   Pravastatin Sodium Rash   Simvastatin Rash    Prior to Admission medications   Medication Sig Start Date End Date Taking? Authorizing Provider  aspirin  81 MG tablet Take 81 mg by mouth daily.   Yes [provider]  atorvastatin  (LIPITOR) 20 MG  tablet TAKE 1 TABLET BY MOUTH EVERY DAY 07/16/23  Yes Arcadio Knuckles, MD  benzonatate  (TESSALON  PERLES) 100 MG capsule Take 1 capsule (100 mg total) by mouth 3 (three) times daily as needed for cough. 03/19/23  Yes Wellington Half, FNP  Blood Glucose Monitoring Suppl (CONTOUR NEXT EZ) w/Device KIT 1 Act by Does not apply route 2 (two) times daily. 11/08/21  Yes Arcadio Knuckles, MD  cholecalciferol (VITAMIN D3) 25 MCG (1000 UNIT) tablet Take 2,000 Units by mouth daily.   Yes [provider]  clopidogrel  (PLAVIX ) 75 MG tablet TAKE 1 TABLET BY MOUTH ONCE DAILY 07/16/23  Yes Arcadio Knuckles, MD  Continuous Glucose Receiver (DEXCOM G7 RECEIVER) DEVI 1 Act by Does not apply route daily. 01/24/23  Yes Arcadio Knuckles, MD  Continuous Glucose Sensor (DEXCOM G7 SENSOR) MISC 1 Act by Does not apply route daily. 01/24/23  Yes Arcadio Knuckles, MD  Cyanocobalamin  (VITAMIN B 12 PO) Take by mouth.   Yes [provider]  FARXIGA  10 MG TABS tablet TAKE 1 TABLET BY MOUTH ONCE DAILY 06/20/23  Yes Arcadio Knuckles, MD  Glucagon  (GVOKE HYPOPEN  2-PACK) 1 MG/0.2ML SOAJ Inject 1 Act into the skin daily as needed. 01/23/23  Yes Arcadio Knuckles, MD  insulin  glargine, 2 Unit Dial , (TOUJEO  MAX SOLOSTAR) 300 UNIT/ML Solostar Pen Inject 10 Units into the skin daily. 02/12/23  Yes Arcadio Knuckles, MD  metoprolol  tartrate (LOPRESSOR ) 25 MG tablet TAKE 1/2 TABLET BY MOUTH 2 TIMES DAILY 07/16/23  Yes Arcadio Knuckles, MD  OneTouch UltraSoft 2 Lancets MISC USE TO CHECK BLOOD SUGAR 2 TIMES DAILY 04/24/22  Yes Arcadio Knuckles, MD  tamsulosin  (FLOMAX ) 0.4 MG CAPS capsule TAKE 1 CAPSULE BY MOUTH EVERY MORNING 01/26/23  Yes Arcadio Knuckles, MD  TRUEPLUS 5-BEVEL PEN NEEDLES 31G X 6 MM MISC USE TO INJECT insulin  UP TO 5 TIMES DAILY 12/20/22  Yes Arcadio Knuckles, MD    Blood pressure (!) 144/69, pulse 76, height 5' 3 (1.6 m), weight 130 lb (59 kg), SpO2 94%. Exam: General: Communicates without difficulty, well nourished, no  acute distress. Head: Normocephalic, no evidence injury, no tenderness, facial buttresses intact without stepoff. Face/sinus: No tenderness to palpation and percussion. Facial movement is normal and symmetric. Eyes: PERRL, EOMI. No scleral icterus, conjunctivae clear. Neuro: CN II exam reveals vision grossly intact.  No nystagmus at any point of gaze. Ears: Auricles well formed without lesions.  Bilateral cerumen impaction.  Nose: External evaluation reveals normal support and skin without lesions.  Dorsum is intact.  Anterior rhinoscopy reveals congested mucosa over anterior aspect of inferior turbinates and intact septum.  No purulence noted. Oral:  Oral cavity and oropharynx are intact, symmetric, without erythema or edema.  Mucosa is moist without lesions. Neck: Full range of motion without pain.  There is no significant lymphadenopathy.  No masses palpable.  Thyroid  bed within normal limits to palpation.  Parotid glands and submandibular glands equal bilaterally without mass.  Trachea is midline. Neuro:  CN 2-12 grossly intact. Vestibular: No nystagmus at any point of gaze. Dix Hallpike negative. Vestibular: There is no nystagmus with pneumatic pressure on either tympanic membrane or Valsalva. The cerebellar examination is unremarkable.    Procedure: Bilateral cerumen disimpaction Anesthesia: None Description: Under the operating microscope, the cerumen is carefully removed with a combination of cerumen currette, alligator forceps, and suction catheters.  After the cerumen is removed, the TMs are noted to be normal.  No mass, erythema, or lesions. The patient tolerated the procedure well.    Assessment: 1.  Incidental finding of bilateral cerumen impaction.  After the cerumen disimpaction procedure, both tympanic membranes and middle ear spaces are noted to be normal. 2.  Her recurrent dizziness is likely multifactorial.  The patient is severely deconditioned.  She has multiple risk factors for systemic  dizziness. 3.  Her Dix-Hallpike maneuver is negative.  Plan: 1.  Otomicroscopy with bilateral cerumen disimpaction. 2.  The physical exam findings are reviewed with the patient and her daughter. 3.  The patient is strongly encouraged to increase her activity level. 4.  The pathophysiology of vestibular dysfunction and dizziness are discussed extensively with the patient and her daughter. The possible differential diagnoses are reviewed. Questions are invited and answered.   5.  The patient will likely benefit from undergoing physical therapy/vestibular rehabilitation to improve her balancing function. A referral will be arranged as soon as possible.   Jordy Hewins W Iz[place] Shughart 07/24/2023, 12:06 PM

## 2023-07-26 ENCOUNTER — Encounter: Payer: Self-pay | Admitting: Internal Medicine

## 2023-07-26 ENCOUNTER — Ambulatory Visit (INDEPENDENT_AMBULATORY_CARE_PROVIDER_SITE_OTHER): Admitting: Internal Medicine

## 2023-07-26 ENCOUNTER — Ambulatory Visit (INDEPENDENT_AMBULATORY_CARE_PROVIDER_SITE_OTHER)

## 2023-07-26 VITALS — BP 156/66 | HR 61 | Temp 98.1°F | Ht 63.0 in | Wt 115.2 lb

## 2023-07-26 DIAGNOSIS — I10 Essential (primary) hypertension: Secondary | ICD-10-CM

## 2023-07-26 DIAGNOSIS — E1121 Type 2 diabetes mellitus with diabetic nephropathy: Secondary | ICD-10-CM | POA: Diagnosis not present

## 2023-07-26 DIAGNOSIS — E441 Mild protein-calorie malnutrition: Secondary | ICD-10-CM

## 2023-07-26 DIAGNOSIS — Z7984 Long term (current) use of oral hypoglycemic drugs: Secondary | ICD-10-CM

## 2023-07-26 DIAGNOSIS — M545 Low back pain, unspecified: Secondary | ICD-10-CM

## 2023-07-26 DIAGNOSIS — N1832 Chronic kidney disease, stage 3b: Secondary | ICD-10-CM

## 2023-07-26 DIAGNOSIS — Z7985 Long-term (current) use of injectable non-insulin antidiabetic drugs: Secondary | ICD-10-CM

## 2023-07-26 DIAGNOSIS — S3992XA Unspecified injury of lower back, initial encounter: Secondary | ICD-10-CM | POA: Insufficient documentation

## 2023-07-26 NOTE — Progress Notes (Unsigned)
 Subjective:  Patient ID: Michelle Lopez, female    DOB: 05/16/1945  Age: 78 y.o. MRN: [id number]  CC: Medical Management of Chronic Issues (4-6 month follow up. Patients daughter is concerned about the patients weight loss. Patient had a fall about a month ago when she was trying to close the door. She didn't hit her head nor did she seen any blood.  Patient states that she has had dizziness since her fall. ) and Back Pain   HPI Michelle Lopez presents for f/up ----  Discussed the use of AI scribe software for clinical note transcription with the patient, who gave verbal consent to proceed.  History of Present Illness   Michelle Lopez is an 78 year old female who presents with persistent back pain following a fall one month ago.  Approximately one month ago, she experienced a fall, landing on her buttocks, which resulted in persistent soreness in her back. Initially, she described pain in her hip but later clarified that the pain is actually in her back.  She has not undergone any imaging studies such as an x-ray to evaluate the injury. She is currently taking pain medication, which she finds helpful in managing the pain. Despite the discomfort, she is able to walk and bear weight on the affected side, although it still hurts. She can move her legs without pain and has no numbness, weakness, or tingling in her legs or feet.  In terms of daily activities, she is able to go up and down the steps in her house, although she does not engage in much other physical activity. She reports experiencing weight loss but has no fevers, chills, nausea, vomiting, or loss of appetite.       Outpatient Medications Prior to Visit  Medication Sig Dispense Refill   aspirin  81 MG tablet Take 81 mg by mouth daily.     atorvastatin  (LIPITOR) 20 MG tablet TAKE 1 TABLET BY MOUTH EVERY DAY 90 tablet 0   benzonatate  (TESSALON  PERLES) 100 MG capsule Take 1 capsule (100 mg total) by mouth 3 (three) times daily as needed  for cough. 20 capsule 0   Blood Glucose Monitoring Suppl (CONTOUR NEXT EZ) w/Device KIT 1 Act by Does not apply route 2 (two) times daily. 1 kit 3   cholecalciferol (VITAMIN D3) 25 MCG (1000 UNIT) tablet Take 2,000 Units by mouth daily.     clopidogrel  (PLAVIX ) 75 MG tablet TAKE 1 TABLET BY MOUTH ONCE DAILY 90 tablet 0   Continuous Glucose Receiver (DEXCOM G7 RECEIVER) DEVI 1 Act by Does not apply route daily. 9 each 1   Continuous Glucose Sensor (DEXCOM G7 SENSOR) MISC 1 Act by Does not apply route daily. 9 each 1   Cyanocobalamin  (VITAMIN B 12 PO) Take by mouth.     FARXIGA  10 MG TABS tablet TAKE 1 TABLET BY MOUTH ONCE DAILY 90 tablet 0   Glucagon  (GVOKE HYPOPEN  2-PACK) 1 MG/0.2ML SOAJ Inject 1 Act into the skin daily as needed. 2 mL 5   insulin  glargine, 2 Unit Dial , (TOUJEO  MAX SOLOSTAR) 300 UNIT/ML Solostar Pen Inject 10 Units into the skin daily. 6 mL 0   metoprolol  tartrate (LOPRESSOR ) 25 MG tablet TAKE 1/2 TABLET BY MOUTH 2 TIMES DAILY 90 tablet 0   OneTouch UltraSoft 2 Lancets MISC USE TO CHECK BLOOD SUGAR 2 TIMES DAILY 300 each 1   tamsulosin  (FLOMAX ) 0.4 MG CAPS capsule TAKE 1 CAPSULE BY MOUTH EVERY MORNING 90 capsule 0   TRUEPLUS 5-BEVEL PEN NEEDLES  31G X 6 MM MISC USE TO INJECT insulin  UP TO 5 TIMES DAILY 300 each 1   No facility-administered medications prior to visit.    ROS Review of Systems  Objective:  BP (!) 156/66 (BP Location: Left Arm, Patient Position: Sitting, Cuff Size: Normal)   Pulse 61   Temp 98.1 F (36.7 C) (Oral)   Ht 5' 3 (1.6 m)   Wt 115 lb 3.2 oz (52.3 kg)   SpO2 97%   BMI 20.41 kg/m   BP Readings from Last 3 Encounters:  07/26/23 (!) 156/66  07/23/23 (!) 144/69  04/19/23 (!) 150/68    Wt Readings from Last 3 Encounters:  07/26/23 115 lb 3.2 oz (52.3 kg)  07/23/23 130 lb (59 kg)  05/08/23 119 lb (54 kg)    Physical Exam  Lab Results  Component Value Date   WBC 7.9 03/26/2023   HGB 15.3 (H) 03/26/2023   HCT 49.3 (H) 03/26/2023   PLT  280 03/26/2023   GLUCOSE 123 (H) 07/26/2023   CHOL 179 06/01/2022   TRIG 190.0 (H) 06/01/2022   HDL 37.20 (L) 06/01/2022   LDLDIRECT 100.0 07/06/2015   LDLCALC 104 (H) 06/01/2022   ALT 10 03/26/2023   AST 17 03/26/2023   NA 141 07/26/2023   K 4.1 07/26/2023   CL 102 07/26/2023   CREATININE 1.04 07/26/2023   BUN 18 07/26/2023   CO2 29 07/26/2023   TSH 1.44 02/22/2022   INR 0.9 04/03/2019   HGBA1C 7.6 (H) 07/26/2023   MICROALBUR 1.2 01/23/2023    MR BRAIN WO CONTRAST Result Date: 03/27/2023 CLINICAL DATA:  Acute neurologic deficit EXAM: MRI HEAD WITHOUT CONTRAST TECHNIQUE: Multiplanar, multiecho pulse sequences of the brain and surrounding structures were obtained without intravenous contrast. COMPARISON:  07/05/2014 FINDINGS: Brain: No acute infarct, mass effect or extra-axial collection. No acute or chronic hemorrhage. There is confluent hyperintense T2-weighted signal within the white matter. Parenchymal volume and CSF spaces are normal. Chronic infarcts of the left cerebellum and brainstem. The midline structures are normal. Vascular: Chronic occlusion of the left vertebral artery. Otherwise normal flow voids. Skull and upper cervical spine: Normal calvarium and skull base. Visualized upper cervical spine and soft tissues are normal. Sinuses/Orbits:No paranasal sinus fluid levels or advanced mucosal thickening. No mastoid or middle ear effusion. Normal orbits. IMPRESSION: 1. No acute intracranial abnormality. 2. 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 Electronically Signed   By: Juanetta Nordmann M.D.   On: 03/27/2023 02:53   CT Head Wo Contrast Result Date: 03/26/2023 CLINICAL DATA:  Confusion EXAM: CT HEAD WITHOUT CONTRAST TECHNIQUE: Contiguous axial images were obtained from the base of the skull through the vertex without intravenous contrast. RADIATION DOSE REDUCTION: This exam was performed according to the departmental dose-optimization program which  includes automated exposure control, adjustment of the mA and/or kV according to patient size and/or use of iterative reconstruction technique. COMPARISON:  01/20/2023 FINDINGS: Brain: No acute CT findings. Extensive old infarction in the left side of the cerebellum. Chronic small-vessel changes of the brainstem, thalami and cerebral hemispheric white matter. No sign of acute infarction, mass lesion, hemorrhage, hydrocephalus or extra-axial collection. Vascular: There is atherosclerotic calcification of the major vessels at the base of the brain. Skull: Negative Sinuses/Orbits: Ordinary seasonal mucosal inflammatory changes. Orbits negative. Other: None IMPRESSION: No acute CT finding. Extensive old infarction in the left side of the cerebellum. Chronic small-vessel changes of the brainstem, thalami and cerebral hemispheric white matter. Electronically Signed  By: Bettylou Brunner M.D.   On: 03/26/2023 19:48   DG Chest 2 View Result Date: 03/26/2023 CLINICAL DATA:  Flu-like symptoms. EXAM: CHEST - 2 VIEW COMPARISON:  03/19/2023. FINDINGS: Bilateral lung fields are essentially clear. Scattered sub 5 mm calcified granuloma overlying the bilateral upper mid lung zones are unchanged since the prior CT scan from 12/14/2009. No acute consolidation or lung collapse. No pulmonary edema. Bilateral costophrenic angles are clear. Stable cardio-mediastinal silhouette. No acute osseous abnormalities. The soft tissues are within normal limits. IMPRESSION: No active cardiopulmonary disease. Electronically Signed   By: Beula Brunswick M.D.   On: 03/26/2023 16:46   DG Lumbar Spine Complete Result Date: 07/26/2023 CLINICAL DATA:  Low back pain following a fall 1 month ago. EXAM: LUMBAR SPINE - COMPLETE 4+ VIEW COMPARISON:  None Available. FINDINGS: Partially sacralized L5 vertebra. Mild multilevel degenerative changes including facet degenerative changes with associated grade 1 anterolisthesis at the L4-5 level. No fractures or  pars defects. Atheromatous arterial calcifications without visible aneurysm. Severe right hip degenerative changes. Left hip prosthesis. IMPRESSION: 1. No fracture. 2. Mild multilevel degenerative changes. 3. Severe right hip degenerative changes. Electronically Signed   By: Catherin Closs M.D.   On: 07/26/2023 15:57     Assessment & Plan:  Acute bilateral low back pain without sciatica -     DG Lumbar Spine Complete; Future  Lower back injury, initial encounter -     DG Lumbar Spine Complete; Future  Stage 3b chronic kidney disease (HCC) -     Basic metabolic panel with GFR; Future  Type 2 diabetes mellitus with diabetic nephropathy, without long-term current use of insulin  (HCC) -     Basic metabolic panel with GFR; Future -     Hemoglobin A1c; Future     Follow-up: No follow-ups on file.  Sandra Crouch, MD

## 2023-07-27 LAB — HEMOGLOBIN A1C: Hgb A1c MFr Bld: 7.6 % — ABNORMAL HIGH (ref 4.6–6.5)

## 2023-07-27 LAB — BASIC METABOLIC PANEL WITH GFR
BUN: 18 mg/dL (ref 6–23)
CO2: 29 meq/L (ref 19–32)
Calcium: 9.8 mg/dL (ref 8.4–10.5)
Chloride: 102 meq/L (ref 96–112)
Creatinine, Ser: 1.04 mg/dL (ref 0.40–1.20)
GFR: 50.78 mL/min — ABNORMAL LOW (ref >60.00)
Glucose, Bld: 123 mg/dL — ABNORMAL HIGH (ref 70–99)
Potassium: 4.1 meq/L (ref 3.5–5.1)
Sodium: 141 meq/L (ref 135–145)

## 2023-07-29 ENCOUNTER — Ambulatory Visit: Payer: Self-pay | Admitting: Internal Medicine

## 2023-07-29 MED ORDER — DRONABINOL 2.5 MG PO CAPS: 2.5000 mg | ORAL_CAPSULE | Freq: Two times a day (BID) | ORAL | 2 refills | Status: DC

## 2023-07-30 ENCOUNTER — Ambulatory Visit

## 2023-07-31 ENCOUNTER — Telehealth: Payer: Self-pay | Admitting: Internal Medicine

## 2023-07-31 NOTE — Telephone Encounter (Signed)
 Copied from CRM [id number]. Topic: Clinical - Prescription Issue >> Jul 30, 2023  3:51 PM Deaijah H wrote: Reason for CRM: [facility] due to dronabinol (MARINOL) 2.5 MG capsule being completely unavailable and if Dr. Joshua wanted to change prescription

## 2023-08-01 ENCOUNTER — Encounter: Payer: Self-pay | Admitting: Internal Medicine

## 2023-08-01 ENCOUNTER — Ambulatory Visit (INDEPENDENT_AMBULATORY_CARE_PROVIDER_SITE_OTHER)

## 2023-08-01 VITALS — BP 128/68 | HR 65 | Ht 63.5 in | Wt 117.4 lb

## 2023-08-01 DIAGNOSIS — Z5986 Financial insecurity: Secondary | ICD-10-CM | POA: Diagnosis not present

## 2023-08-01 DIAGNOSIS — Z59819 Housing instability, housed unspecified: Secondary | ICD-10-CM | POA: Diagnosis not present

## 2023-08-01 DIAGNOSIS — Z0000 Encounter for general adult medical examination without abnormal findings: Secondary | ICD-10-CM | POA: Diagnosis not present

## 2023-08-01 DIAGNOSIS — E119 Type 2 diabetes mellitus without complications: Secondary | ICD-10-CM

## 2023-08-01 DIAGNOSIS — H9193 Unspecified hearing loss, bilateral: Secondary | ICD-10-CM

## 2023-08-01 NOTE — Patient Instructions (Addendum)
 Ms. Kief , Thank you for taking time out of your busy schedule to complete your Annual Wellness Visit with me. I enjoyed our conversation and look forward to speaking with you again next year. I, as well as your care team,  appreciate your ongoing commitment to your health goals. Please review the following plan we discussed and let me know if I can assist you in the future. Your Game plan/ To Do List    Referrals: If you haven't heard from the office you've been referred to, please reach out to them at the phone provided.  [facility] Referral for financial/housing insecurities; Referral to Dr Cleatus for an eye exam; Referral to an Audiologist for a hearing exam Follow up Visits: Next Medicare AWV with our clinical staff: 08/07/2024   Have you seen your provider in the last 6 months (3 months if uncontrolled diabetes)? Yes Next Office Visit with your provider: 10/31/2023  Clinician Recommendations:  Aim for 30 minutes of exercise or brisk walking, 6-8 glasses of water, and 5 servings of fruits and vegetables each day. Educated and advised on getting the Tdap (Tetenus) and COVID vaccines in 2025 at local pharmacy.      This is a list of the screening recommended for you and due dates:  Health Maintenance  Topic Date Due   COVID-19 Vaccine (4 - 2024-25 season) 08/11/2023*   DTaP/Tdap/Td vaccine (2 - Tdap) 07/25/2024*   Flu Shot  09/07/2023   Yearly kidney health urinalysis for diabetes  01/23/2024   Hemoglobin A1C  01/25/2024   Complete foot exam   05/07/2024   Yearly kidney function blood test for diabetes  07/25/2024   Medicare Annual Wellness Visit  07/31/2024   Pneumococcal Vaccine for age over 74  Completed   DEXA scan (bone density measurement)  Completed   Hepatitis B Vaccine  Aged Out   HPV Vaccine  Aged Out   Meningitis B Vaccine  Aged Out   Hepatitis C Screening  Discontinued   Zoster (Shingles) Vaccine  Discontinued  *Topic was postponed. The date shown is not the original due  date.    Advanced directives: (Copy Requested) Please bring a copy of your health care power of attorney and living will to the office to be added to your chart at your convenience. You can mail to [facility] [address] [place], [place] [address] or email to [email or web address] Advance Care Planning is important because it:  [x]  Makes sure you receive the medical care that is consistent with your values, goals, and preferences  [x]  It provides guidance to your family and loved ones and reduces their decisional burden about whether or not they are making the right decisions based on your wishes.  Follow the link provided in your after visit summary or read over the paperwork we have mailed to you to help you started getting your Advance Directives in place. If you need assistance in completing these, please reach out to us  so that we can help you!

## 2023-08-01 NOTE — Progress Notes (Signed)
 Subjective:   Michelle Lopez is a 78 y.o. who presents for a Medicare Wellness preventive visit.  As a reminder, Annual Wellness Visits don't include a physical exam, and some assessments may be limited, especially if this visit is performed virtually. We may recommend an in-person follow-up visit with your provider if needed.  Visit Complete: In person  Persons Participating in Visit: Patient. With Dtr, [name].  AWV Questionnaire: No: Patient Medicare AWV questionnaire was not completed prior to this visit.  Cardiac Risk Factors include: advanced age (>32men, >86 women);diabetes mellitus;dyslipidemia;hypertension     Objective:    Today's Vitals   08/01/23 1405  BP: 128/68  Pulse: 65  SpO2: 96%  Weight: 117 lb 6.4 oz (53.3 kg)  Height: 5' 3.5 (1.613 m)   Body mass index is 20.47 kg/m.     08/01/2023    2:03 PM 04/19/2023    9:52 AM 01/20/2023    1:36 PM 01/03/2022    1:52 PM 11/08/2021    3:38 PM 03/19/2020    3:48 PM 04/03/2019    3:00 PM  Advanced Directives  Does Patient Have a Medical Advance Directive? Yes No No No Yes Yes Yes  Type of Estate agent of [place];Living will    Healthcare Power of [place];Living will Living will;Healthcare Power of [employer] Power of Attorney  Does patient want to make changes to medical advance directive?      No - Patient declined No - Patient declined  Copy of Healthcare Power of Attorney in Chart? No - copy requested    No - copy requested No - copy requested No - copy requested  Would patient like information on creating a medical advance directive?  No - Patient declined         Current Medications (verified) Outpatient Encounter Medications as of 08/01/2023  Medication Sig   aspirin  81 MG tablet Take 81 mg by mouth daily.   atorvastatin  (LIPITOR) 20 MG tablet TAKE 1 TABLET BY MOUTH EVERY DAY   benzonatate  (TESSALON  PERLES) 100 MG capsule Take 1 capsule (100 mg total) by mouth 3  (three) times daily as needed for cough.   Blood Glucose Monitoring Suppl (CONTOUR NEXT EZ) w/Device KIT 1 Act by Does not apply route 2 (two) times daily.   cholecalciferol (VITAMIN D3) 25 MCG (1000 UNIT) tablet Take 2,000 Units by mouth daily.   clopidogrel  (PLAVIX ) 75 MG tablet TAKE 1 TABLET BY MOUTH ONCE DAILY   Continuous Glucose Receiver (DEXCOM G7 RECEIVER) DEVI 1 Act by Does not apply route daily.   Continuous Glucose Sensor (DEXCOM G7 SENSOR) MISC 1 Act by Does not apply route daily.   Cyanocobalamin  (VITAMIN B 12 PO) Take by mouth.   dronabinol (MARINOL) 2.5 MG capsule Take 1 capsule (2.5 mg total) by mouth 2 (two) times daily before lunch and supper.   FARXIGA  10 MG TABS tablet TAKE 1 TABLET BY MOUTH ONCE DAILY   Glucagon  (GVOKE HYPOPEN  2-PACK) 1 MG/0.2ML SOAJ Inject 1 Act into the skin daily as needed.   insulin  glargine, 2 Unit Dial , (TOUJEO  MAX SOLOSTAR) 300 UNIT/ML Solostar Pen Inject 10 Units into the skin daily.   metoprolol  tartrate (LOPRESSOR ) 25 MG tablet TAKE 1/2 TABLET BY MOUTH 2 TIMES DAILY   OneTouch UltraSoft 2 Lancets MISC USE TO CHECK BLOOD SUGAR 2 TIMES DAILY   tamsulosin  (FLOMAX ) 0.4 MG CAPS capsule TAKE 1 CAPSULE BY MOUTH EVERY MORNING   TRUEPLUS 5-BEVEL PEN NEEDLES 31G X 6 MM MISC  USE TO INJECT insulin  UP TO 5 TIMES DAILY   No facility-administered encounter medications on file as of 08/01/2023.    Allergies (verified) Amlodipine , Crestor  [rosuvastatin  calcium ], Morphine , Pravastatin sodium, and Simvastatin   History: Past Medical History:  Diagnosis Date   Cerebrovascular disease, unspecified    Cocaine abuse, unspecified    Diabetes mellitus    type 2, uncontrolled   Diarrhea    Essential hypertension, benign    Hyperlipidemia    Stroke (HCC)    x7   Past Surgical History:  Procedure Laterality Date   TOTAL HIP ARTHROPLASTY  1970   Dr. Oneil Herald   Family History  Problem Relation Age of Onset   Colon cancer Neg Hx    Social History    Socioeconomic History   Marital status: Divorced    Spouse name: Not on file   Number of children: 2   Years of education: 8   Highest education level: Not on file  Occupational History   Occupation: Disability  Tobacco Use   Smoking status: Never   Smokeless tobacco: Never  Vaping Use   Vaping status: Never Used  Substance and Sexual Activity   Alcohol  use: No    Alcohol /week: 0.0 standard drinks of alcohol    Drug use: No   Sexual activity: Not Currently  Other Topics Concern   Not on file  Social History Narrative   Daily caffeine    Two floor    Drinks no caffeine   Daughter Michelle Lopez stays with her   Was a homemaker over the years   Social Drivers of Corporate investment banker Strain: Medium Risk (08/01/2023)   Overall Financial Resource Strain (CARDIA)    Difficulty of Paying Living Expenses: Somewhat hard  Food Insecurity: No Food Insecurity (08/01/2023)   Hunger Vital Sign    Worried About Running Out of Food in the Last Year: Never true    Ran Out of Food in the Last Year: Never true  Transportation Needs: No Transportation Needs (08/01/2023)   PRAPARE - Administrator, Civil Service (Medical): No    Lack of Transportation (Non-Medical): No  Physical Activity: Inactive (08/01/2023)   Exercise Vital Sign    Days of Exercise per Week: 0 days    Minutes of Exercise per Session: 0 min  Stress: No Stress Concern Present (08/01/2023)   [employer] of Occupational Health - Occupational Stress Questionnaire    Feeling of Stress: Not at all  Social Connections: Moderately Isolated (08/01/2023)   Social Connection and Isolation Panel    Frequency of Communication with Friends and Family: More than three times a week    Frequency of Social Gatherings with Friends and Family: Once a week    Attends Religious Services: Never    Database administrator or Organizations: Yes    Attends Banker Meetings: Never    Marital Status: Never married     Tobacco Counseling Counseling given: No    Clinical Intake:  Pre-visit preparation completed: Yes  Pain : No/denies pain     BMI - recorded: 20.47 Nutritional Status: BMI of 19-24  Normal Nutritional Risks: None Diabetes: Yes CBG done?: No Did pt. bring in CBG monitor from home?: No  Lab Results  Component Value Date   HGBA1C 7.6 (H) 07/26/2023   HGBA1C 6.2 01/23/2023   HGBA1C 8.1 (H) 06/01/2022     How often do you need to have someone help you when you read instructions, pamphlets,  or other written materials from your doctor or pharmacy?: 5 - Always (Dtr handles)  Interpreter Needed?: No  Information entered by :: Verdie Saba, CMA   Activities of Daily Living     08/01/2023    2:44 PM  In your present state of health, do you have any difficulty performing the following activities:  Hearing? 1  Comment referral to Audiologist  Vision? 1  Comment referral to [facility] (established pt)  Difficulty concentrating or making decisions? 1  Comment Dtr handles decisions  Walking or climbing stairs? 1  Comment uses a cane  Dressing or bathing? 1  Comment Dtr assists  Doing errands, shopping? 1  Comment Dtr Therapist, music and eating ? Y  Comment Dtr assists  Using the Toilet? Y  Comment Dtr assists  In the past six months, have you accidently leaked urine? Y  Comment wears a depend  Do you have problems with loss of bowel control? Y  Comment wears a depend  Managing your Medications? Y  Comment Dtr handles  Managing your Finances? Y  Comment Dtr Hotel manager your Housekeeping? Y  Comment Dtr handles    Patient Care Team: Joshua Debby CROME, MD as PCP - Diedre Cleatus Collar, MD as Consulting Physician (Ophthalmology) Karis Daniel Motts, MD as Referring Physician (Otolaryngology) Wertman, Sara E, PA-C (Neurology)  I have updated your Care Teams any recent Medical Services you may have received from other providers in  the past year.     Assessment:   This is a routine wellness examination for [place].  Hearing/Vision screen Hearing Screening - Comments:: Referral to an Audiologist; has seen an ENT Vision Screening - Comments:: Referral to [facility]   Goals Addressed               This Visit's Progress     Patient Stated (pt-stated)        Patient stated she plans to start Physical Therapy        Depression Screen     08/01/2023    2:15 PM 07/26/2023    2:46 PM 11/09/2022    3:24 PM 11/08/2021    3:17 PM 06/13/2021    3:00 PM 06/02/2020    9:17 AM 03/19/2020    3:46 PM  PHQ 2/9 Scores  PHQ - 2 Score 0 1 4 0 0 0 0  PHQ- 9 Score 3 4 5         Fall Risk     08/01/2023    2:14 PM 07/26/2023    2:46 PM 04/19/2023    9:52 AM 11/09/2022    3:24 PM 11/08/2021    3:18 PM  Fall Risk   Falls in the past year? 1 1 0 1 0  Number falls in past yr: 0 1 0 0 0  Comment 1      Injury with Fall? 0 1 0 1 0  Risk for fall due to : Impaired balance/gait;Impaired mobility Impaired balance/gait  History of fall(s) No Fall Risks  Risk for fall due to: Comment uses a cane      Follow up Falls evaluation completed;Falls prevention discussed Falls evaluation completed Falls evaluation completed Falls evaluation completed Falls prevention discussed      Data saved with a previous flowsheet row definition    MEDICARE RISK AT HOME:  Medicare Risk at Home Any stairs in or around the home?: Yes If so, are there any without handrails?: No Home free of  loose throw rugs in walkways, pet beds, electrical cords, etc?: Yes Adequate lighting in your home to reduce risk of falls?: Yes Life alert?: No Use of a cane, walker or w/c?: Yes (cane/walker) Grab bars in the bathroom?: No Shower chair or bench in shower?: No Elevated toilet seat or a handicapped toilet?: No  TIMED UP AND GO:  Was the test performed?  No  Cognitive Function: Impaired: Patient has current diagnosis of cognitive impairment.     04/19/2023   12:00 PM 08/27/2018    9:55 AM  MMSE - Mini Mental State Exam  Not completed: -- --        08/01/2023    2:23 PM 11/08/2021    3:19 PM  6CIT Screen  What Year? 4 points 0 points  What month? 0 points 0 points  What time? 3 points 0 points  Count back from 20 0 points 0 points  Months in reverse 4 points 4 points  Repeat phrase 10 points 0 points  Total Score 21 points 4 points    Immunizations Immunization History  Administered Date(s) Administered   Fluad Quad(high Dose 65+) 01/07/2019, 11/08/2021   Fluad Trivalent(High Dose 65+) 01/23/2023   Influenza Whole 01/14/2009, 11/16/2009   Influenza, High Dose Seasonal PF 12/14/2014, 11/27/2016, 10/17/2017   Influenza,inj,Quad PF,6+ Mos 10/31/2013   Influenza-Unspecified 12/07/2012   PFIZER(Purple Top)SARS-COV-2 Vaccination 09/04/2019, 10/07/2019, 06/14/2020   Pneumococcal Conjugate-13 06/23/2014   Pneumococcal Polysaccharide-23 12/24/2008, 08/23/2017   Td 11/16/2009   Zoster, Live 10/03/2012    Screening Tests Health Maintenance  Topic Date Due   COVID-19 Vaccine (4 - 2024-25 season) 08/11/2023 (Originally 10/08/2022)   DTaP/Tdap/Td (2 - Tdap) 07/25/2024 (Originally 11/17/2019)   INFLUENZA VACCINE  09/07/2023   Diabetic kidney evaluation - Urine ACR  01/23/2024   HEMOGLOBIN A1C  01/25/2024   FOOT EXAM  05/07/2024   Diabetic kidney evaluation - eGFR measurement  07/25/2024   Medicare Annual Wellness (AWV)  07/31/2024   Pneumococcal Vaccine: 50+ Years  Completed   DEXA SCAN  Completed   Hepatitis B Vaccines  Aged Out   HPV VACCINES  Aged Out   Meningococcal B Vaccine  Aged Out   Hepatitis C Screening  Discontinued   Zoster Vaccines- Shingrix   Discontinued    Health Maintenance  There are no preventive care reminders to display for this patient. Health Maintenance Items Addressed: Referral sent to Optometry/Ophthalmology, Referral to VBCI - financial/housing/dementia resources insecurities  Additional  Screening:  Vision Screening: Recommended annual ophthalmology exams for early detection of glaucoma and other disorders of the eye. Would you like a referral to an eye doctor? Yes  Referral to [facility] for a Diabetic eye exam  Dental Screening: Recommended annual dental exams for proper oral hygiene  Community Resource Referral / Chronic Care Management: CRR required this visit?  Yes   CCM required this visit?  No   Plan:    I have personally reviewed and noted the following in the patient's chart:   Medical and social history Use of alcohol , tobacco or illicit drugs  Current medications and supplements including opioid prescriptions. Patient is not currently taking opioid prescriptions. Functional ability and status Nutritional status Physical activity Advanced directives List of other physicians Hospitalizations, surgeries, and ER visits in previous 12 months Vitals Screenings to include cognitive, depression, and falls Referrals and appointments  In addition, I have reviewed and discussed with patient certain preventive protocols, quality metrics, and best practice recommendations. A written personalized care plan for preventive  services as well as general preventive health recommendations were provided to patient.   Verdie CHRISTELLA Saba, CMA   08/01/2023   After Visit Summary: (In Person-Declined) Patient declined AVS at this time.  Notes: Please refer to Routing Comments.

## 2023-08-02 ENCOUNTER — Telehealth: Payer: Self-pay | Admitting: *Deleted

## 2023-08-02 NOTE — Progress Notes (Signed)
 Complex Care Management Note Care Guide Note  08/02/2023 Name: Michelle Lopez MRN: [id number] DOB: March 19, 1945   Complex Care Management Outreach Attempts: An unsuccessful telephone outreach was attempted today to offer the patient information about available complex care management services.  Follow Up Plan:  Additional outreach attempts will be made to offer the patient complex care management information and services.   Encounter Outcome:  No Answer  Thedford Franks, CMA [place]  [facility], [facility] Guide Direct Dial : [phone]  Fax: [phone] Website: [email or web address]

## 2023-08-03 ENCOUNTER — Telehealth: Payer: Self-pay | Admitting: Internal Medicine

## 2023-08-03 ENCOUNTER — Other Ambulatory Visit: Payer: Self-pay | Admitting: Internal Medicine

## 2023-08-03 DIAGNOSIS — F5104 Psychophysiologic insomnia: Secondary | ICD-10-CM

## 2023-08-03 DIAGNOSIS — E441 Mild protein-calorie malnutrition: Secondary | ICD-10-CM

## 2023-08-03 MED ORDER — MIRTAZAPINE 7.5 MG PO TABS: 7.5000 mg | ORAL_TABLET | Freq: Every day | ORAL | 0 refills | Status: DC

## 2023-08-03 NOTE — Telephone Encounter (Signed)
 Pt brought in POA info, scanned into chart and routed to [facility] POOL. Please advise, Thanks

## 2023-08-03 NOTE — Telephone Encounter (Signed)
**Note De-identified  [place] Obfuscation** Please advise 

## 2023-08-03 NOTE — Telephone Encounter (Signed)
 Noted

## 2023-08-03 NOTE — Progress Notes (Unsigned)
 Complex Care Management Note Care Guide Note  08/03/2023 Name: Michelle Lopez MRN: [id number] DOB: 1945/08/19   Complex Care Management Outreach Attempts: A second unsuccessful outreach was attempted today to offer the patient with information about available complex care management services.  Follow Up Plan:  Additional outreach attempts will be made to offer the patient complex care management information and services.   Encounter Outcome:  No Answer  Thedford Franks, CMA [place]  [facility], [facility] Guide Direct Dial : [phone]  Fax: [phone] Website: [email or web address]

## 2023-08-06 NOTE — Progress Notes (Signed)
 Complex Care Management Note  Care Guide Note 08/06/2023 Name: Raevin Wierenga MRN: [id number] DOB: 11/07/1945  Dovie Kapusta is a 78 y.o. year old female who sees Joshua Debby CROME, MD for primary care. I reached out to [name] by phone today to offer complex care management services.  Ms. Lanigan was given information about Complex Care Management services today including:   The Complex Care Management services include support from the care team which includes your Nurse Care Manager, Clinical Social Worker, or Pharmacist.  The Complex Care Management team is here to help remove barriers to the health concerns and goals most important to you. Complex Care Management services are voluntary, and the patient may decline or stop services at any time by request to their care team member.   Complex Care Management Consent Status: Patient agreed to services and verbal consent obtained.   Follow up plan:  Telephone appointment with complex care management team member scheduled for:  08/07/2023 and 08/08/2023  Encounter Outcome:  Patient Scheduled  Thedford Franks, CMA [place]  [facility], [facility] Guide Direct Dial : [phone]  Fax: [phone] Website: [email or web address]

## 2023-08-07 ENCOUNTER — Other Ambulatory Visit: Payer: Self-pay

## 2023-08-08 ENCOUNTER — Other Ambulatory Visit: Payer: Self-pay

## 2023-08-08 NOTE — Patient Outreach (Unsigned)
 Complex Care Management   Visit Note  08/08/2023  Name:  Michelle Lopez MRN: [id number] DOB: 1945/11/30  Situation: Referral received for Complex Care Management related to SDOH Barriers:  Housing and financial constraints.  Financial Resource Strain I obtained verbal consent from Caregiver.  Visit completed with daughter  on the phone  Background:   Past Medical History:  Diagnosis Date   Cerebrovascular disease, unspecified    Cocaine abuse, unspecified    Diabetes mellitus    type 2, uncontrolled   Diarrhea    Essential hypertension, benign    Hyperlipidemia    Stroke [facility])    x7    Assessment: spoke with daughter who denies any medical issues at this time and primary concern is housing and financial strain (recent loss of husband). Patient Reported Symptoms:  Cognitive Cognitive Status: Requires Assistance Decision Making, Alert and oriented to person, place, and time, Able to follow simple commands   Health Maintenance Behaviors: Annual physical exam, Social activities, Hobbies, Immunizations  Neurological Neurological Review of Symptoms: Weakness (left side from previous stroke use of a cane) Neurological Management Strategies: Coping strategies, Routine screening, Activity Neurological Self-Management Outcome: 4 (good)  HEENT HEENT Symptoms Reported: Change or loss of hearing HEENT Management Strategies: Routine screening HEENT Self-Management Outcome: 4 (good)    Cardiovascular Cardiovascular Symptoms Reported: No symptoms reported Does patient have uncontrolled Hypertension?:  (daughter reports ([employer]) takes BP)    Respiratory Respiratory Symptoms Reported: No symptoms reported    Endocrine Endocrine Symptoms Reported: No symptoms reported Is patient diabetic?: Yes Is patient checking blood sugars at home?: Yes    Gastrointestinal Gastrointestinal Symptoms Reported: No symptoms reported      Genitourinary Genitourinary Symptoms Reported: No symptoms  reported    Integumentary Integumentary Symptoms Reported: No symptoms reported    Musculoskeletal Musculoskelatal Symptoms Reviewed: Limited mobility Additional Musculoskeletal Details: Assistive devices   Falls in the past year?: No Number of falls in past year: 1 or less Was there an injury with Fall?: No Fall Risk Category Calculator: 0 Patient Fall Risk Level: Low Fall Risk    Psychosocial Psychosocial Symptoms Reported: No symptoms reported     Quality of Family Relationships: supportive      08/01/2023    2:15 PM  Depression screen PHQ 2/9  Decreased Interest 0  Down, Depressed, Hopeless 0  PHQ - 2 Score 0  Altered sleeping 0  Tired, decreased energy 0  Change in appetite 0  Feeling bad or failure about yourself  0  Trouble concentrating 0  Moving slowly or fidgety/restless 3  Suicidal thoughts 0  PHQ-9 Score 3  Difficult doing work/chores Extremely dIfficult    There were no vitals filed for this visit.  Medications Reviewed Today     Reviewed by Iris Hairston M, RN (Registered Nurse) on 08/08/23 at 1433  Med List Status: <None>   Medication Order Taking? Sig Documenting Provider Last Dose Status Informant  aspirin  81 MG tablet [id number] Yes Take 81 mg by mouth daily. [provider]  Active Child  atorvastatin  (LIPITOR) 20 MG tablet [id number] Yes TAKE 1 TABLET BY MOUTH EVERY DAY Joshua Debby CROME, MD  Active   Blood Glucose Monitoring Suppl (CONTOUR NEXT EZ) w/Device KIT [id number] Yes 1 Act by Does not apply route 2 (two) times daily. Joshua Debby CROME, MD  Active Child  cholecalciferol (VITAMIN D3) 25 MCG (1000 UNIT) tablet [id number] Yes Take 2,000 Units by mouth daily. [provider]  Active Child  clopidogrel  (PLAVIX ) 75 MG  tablet [id number] Yes TAKE 1 TABLET BY MOUTH ONCE DAILY Joshua Debby CROME, MD  Active   Continuous Glucose Receiver [facility] G7 RECEIVER) [place] [id number]  1 Act by Does not apply route daily.  Patient not taking: Reported on  08/08/2023   Joshua Debby CROME, MD  Active Child  Continuous Glucose Sensor [facility] G7 [place]) [place] [id number]  1 Act by Does not apply route daily.  Patient not taking: Reported on 08/08/2023   Joshua Debby CROME, MD  Active Child  Cyanocobalamin  (VITAMIN B 12 PO) [id number] Yes Take by mouth. [provider]  Active Child  FARXIGA  10 MG TABS tablet [id number] Yes TAKE 1 TABLET BY MOUTH ONCE DAILY Joshua Debby CROME, MD  Active   Glucagon  (GVOKE HYPOPEN  2-PACK) 1 MG/0.2ML SOAJ [id number] Yes Inject 1 Act into the skin daily as needed. Joshua Debby CROME, MD  Active Child  insulin  glargine, 2 Unit Dial , (TOUJEO  MAX SOLOSTAR) 300 UNIT/ML Solostar Pen [id number] Yes Inject 10 Units into the skin daily. Joshua Debby CROME, MD  Active Child  metoprolol  tartrate (LOPRESSOR ) 25 MG tablet [id number] Yes TAKE 1/2 TABLET BY MOUTH 2 TIMES DAILY Joshua Debby CROME, MD  Active   mirtazapine (REMERON) 7.5 MG tablet [id number]  Take 1 tablet (7.5 mg total) by mouth at bedtime.  Patient not taking: Reported on 08/08/2023   Joshua Debby CROME, MD  Active   OneTouch UltraSoft 2 Lancets MISC [id number] Yes USE TO CHECK BLOOD SUGAR 2 TIMES DAILY Joshua Debby CROME, MD  Active Child  tamsulosin  (FLOMAX ) 0.4 MG CAPS capsule [id number] Yes TAKE 1 CAPSULE BY MOUTH EVERY MORNING Joshua Debby CROME, MD  Active Child  TRUEPLUS 5-BEVEL PEN NEEDLES 31G X 6 MM MISC [id number] Yes USE TO INJECT insulin  UP TO 5 TIMES DAILY Joshua Debby CROME, MD  Active Child          Recommendation:   Continue Current Plan of Care  Follow Up Plan:   Telephone follow up appointment date/time:  08/21/23 9:30 am  Heddy Shutter, RN, MSN, BSN, CCM [place]  [facility], Population Health Case Manager Phone: [phone]

## 2023-08-09 ENCOUNTER — Other Ambulatory Visit

## 2023-08-09 NOTE — Patient Instructions (Addendum)
 Visit Information  Thank you for taking time to visit with me today. Please don't hesitate to contact me if I can be of assistance to you before our next scheduled appointment.  Our next appointment is by telephone on 08/21/23 at 9:30 am Please call the care guide team at [phone] if you need to cancel or reschedule your appointment.   Following is a copy of your care plan:   Goals Addressed             This Visit's Progress    VBCI RN Care Plan       Problems:  Care Coordination needs related to Financial Strain  and Housing   Goal: Over the next 90 days the Caregiver will work with Child psychotherapist to address Financial constraints related to limited finances and Housing barriers related to the management of available resources as evidenced by review of electronic medical record and patient or social worker report     Patient caregiver will verbalize receipt available resources provided by Child psychotherapist.  Interventions:   SDOH Barriers Patient interviewed and SDOH assessment performed        SDOH Interventions    Flowsheet Row Patient Outreach Telephone from 08/08/2023 in [place] POPULATION HEALTH DEPARTMENT Clinical Support from 08/01/2023 in [facility] [place] [facility] [place] Clinical Support from 11/08/2021 in [facility] [place]  SDOH Interventions     Food Insecurity Interventions Intervention Not Indicated Intervention Not Indicated Intervention Not Indicated  Housing Interventions Other (Comment)  [BSW to call] AMB Referral Intervention Not Indicated  Transportation Interventions -- Intervention Not Indicated Intervention Not Indicated  Utilities Interventions Other (Comment)  [VBCI BSW] AMB Referral Intervention Not Indicated  Alcohol  Usage Interventions -- Intervention Not Indicated (Score <7) Intervention Not Indicated (Score <7)  Financial Strain Interventions -- [employer] Provided Intervention Not Indicated  Physical  Activity Interventions -- Intervention Not Indicated Patient Refused, Other (Comments)  Stress Interventions -- Intervention Not Indicated Intervention Not Indicated  Social Connections Interventions -- Intervention Not Indicated Intervention Not Indicated  Health Literacy Interventions -- Intervention Not Indicated --   Patient interviewed and appropriate assessments performed Discussed plans with patient for ongoing care management follow up and provided patient with direct contact information for care management team Advised patient to be answer the phone for the VBCI social worker scheduled for 08/09/2023. Collaborated with caregiver and care guide to reschedule telephone follow up for 08/09/2023 Patient Self-Care Activities:  Attend all scheduled provider appointments Call provider office for new concerns or questions  Take medications as prescribed   Work with the social worker to address care coordination needs and will continue to work with the clinical team to address health care and disease management related needs  Plan:  Telephone follow up appointment with care management team member scheduled for:  08/21/2023 @ 9:30 am           Please call the Suicide and Crisis Lifeline: 988 call the [place]  National Suicide Prevention Lifeline: 8588550443 or TTY: (845) 863-3301 TTY 520-458-5360) to talk to a trained counselor call 1-800-273-TALK (toll free, 24 hour hotline) if you are experiencing a Mental Health or Behavioral Health Crisis or need someone to talk to.  The patient verbalized understanding of instructions, educational materials, and care plan provided today and DECLINED offer to receive copy of patient instructions, educational materials, and care plan.   Heddy Shutter, RN, MSN, BSN, CCM [place]  [facility], Population Health Case Manager Phone: [phone]

## 2023-08-13 ENCOUNTER — Ambulatory Visit: Attending: Otolaryngology

## 2023-08-13 VITALS — BP 168/70 | HR 72

## 2023-08-13 DIAGNOSIS — R2681 Unsteadiness on feet: Secondary | ICD-10-CM | POA: Insufficient documentation

## 2023-08-13 DIAGNOSIS — H903 Sensorineural hearing loss, bilateral: Secondary | ICD-10-CM | POA: Insufficient documentation

## 2023-08-13 DIAGNOSIS — R42 Dizziness and giddiness: Secondary | ICD-10-CM | POA: Diagnosis present

## 2023-08-13 NOTE — Therapy (Signed)
 OUTPATIENT PHYSICAL THERAPY VESTIBULAR EVALUATION     Patient Name: Michelle Lopez MRN: [id number] DOB:1945-09-16, 78 y.o., female Today's Date: 08/13/2023  END OF SESSION:  PT End of Session - 08/13/23 1014     Visit Number 1    Number of Visits 9    Date for PT Re-Evaluation 09/14/23    Authorization Type [employer] Dual -no auth reqd    PT Start Time 1017    PT Stop Time 1100    PT Time Calculation (min) 43 min    Equipment Utilized During Treatment Gait belt    Activity Tolerance Patient tolerated treatment well    Behavior During Therapy WFL for tasks assessed/performed          Past Medical History:  Diagnosis Date   Cerebrovascular disease, unspecified    Cocaine abuse, unspecified    Diabetes mellitus    type 2, uncontrolled   Diarrhea    Essential hypertension, benign    Hyperlipidemia    Stroke (HCC)    x7   Past Surgical History:  Procedure Laterality Date   TOTAL HIP ARTHROPLASTY  1970   Dr. Oneil Herald   Patient Active Problem List   Diagnosis Date Noted   Acute bilateral low back pain without sciatica 07/26/2023   Lower back injury, initial encounter 07/26/2023   Impacted cerumen of both ears 07/24/2023   Insulin -requiring or dependent type II diabetes mellitus (HCC) 02/13/2023   Essential hypertension, benign 11/12/2022   Need for prophylactic vaccination and inoculation against varicella 06/13/2021   Need for prophylactic vaccination with combined diphtheria-tetanus-pertussis (DTP) vaccine 06/13/2021   Bilateral hearing loss 05/02/2021   Stage 3b chronic kidney disease (HCC) 04/14/2020   Tinea corporis 12/17/2018   Protein-calorie malnutrition, mild (HCC) 10/17/2017   B12 deficiency 08/23/2017   Primary osteoarthritis of right hip 04/03/2017   PAD (peripheral artery disease) (HCC) 07/24/2016   Vitamin D  deficiency 07/06/2015   Osteopenia, senile 03/03/2014   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 03/21/2013   Seborrheic dermatitis  03/05/2013   Dysphagia as late effect of stroke 05/30/2011   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) 05/02/2011   Hyperlipidemia with target LDL less than 70    VERTIGO, CENTRAL 01/14/2009   Insomnia 01/14/2009   CVA (cerebral vascular accident) (HCC) 06/11/2008   DM (diabetes mellitus), type 2 with renal complications (HCC) 06/05/2007    PCP: Debby Molt, MD REFERRING PROVIDER: Daniel Moccasin, MD  REFERRING DIAG: R42 (ICD-10-CM) - Dizziness   THERAPY DIAG:  Dizziness and giddiness - Plan: PT plan of care cert/re-cert  Unsteadiness on feet - Plan: PT plan of care cert/re-cert  ONSET DATE: 07/24/23  Rationale for Evaluation and Treatment: Rehabilitation  SUBJECTIVE:   SUBJECTIVE STATEMENT: Patient arrives to clinic using [facility], initially with adult dtr, who needs to leave but will return. Per patient, reports she's here because she doesn't walk that well. Per patient, she had a CVA a month and a half ago, maybe a little longer. Endorses spinning dizziness at random times with unknown provocation. The dizziness has been going on for months. With further questioning, states that the dizziness started after a CVA. Of note, patient with known dementia and likely a poor historian.  Pt accompanied by: family member  PERTINENT HISTORY: cocaine abuse, DMII, HTN, HLD, CVA x7  PAIN:  Are you having pain? No  PRECAUTIONS: Fall   WEIGHT BEARING RESTRICTIONS: No  FALLS: Has patient fallen in last 6 months? Yes. Number of falls 1;  was trying to close a door and fell backwards  LIVING ENVIRONMENT: Lives with: lives with their family, lives with their spouse, and lives with their daughter Lives in: House/apartment Stairs: Yes: Internal: a flight  steps; on left going up and External: unsure steps; none Has following equipment at home: Vannie - 4 wheeled  PLOF: Needs assistance with ADLs and Needs assistance with homemaking  PATIENT GOALS: just walking I guess  OBJECTIVE:   Note: Objective measures were completed at Evaluation unless otherwise noted.  DIAGNOSTIC FINDINGS: 03/27/23 brain MRI  IMPRESSION: 1. No acute intracranial abnormality. 2. 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  COGNITION: Overall cognitive status: History of cognitive impairments - at baseline   SENSATION: WFL   POSTURE:  rounded shoulders and forward head  Cervical ROM:   WFL, pain free   LOWER EXTREMITY MMT:   MMT Right eval Left eval  Hip flexion 4+ 3+  Hip abduction 5 4  Hip adduction 5 4  Hip internal rotation    Hip external rotation    Knee flexion 5 4+  Knee extension 5 4  Ankle dorsiflexion 5 5  Ankle plantarflexion    Ankle inversion    Ankle eversion    (Blank rows = not tested)  BED MOBILITY:  Reports independent and denies dizziness   GAIT: Gait pattern: step to pattern, decreased arm swing- Left, decreased step length- Left, decreased stride length, decreased hip/knee flexion- Left, decreased ankle dorsiflexion- Left, circumduction- Left, trunk rotated posterior- Left, trunk flexed, narrow BOS, and poor foot clearance- Left Distance walked: clinic Assistive device utilized: Single point cane Level of assistance: Modified independence and SBA Comments: very slow gait speed  FUNCTIONAL TESTS:    [facility] PT Assessment - 08/13/23 0001       Standardized Balance Assessment   Standardized Balance Assessment Five Times Sit to Stand    Five times sit to stand comments  34.87s B UE           VESTIBULAR ASSESSMENT:  GENERAL OBSERVATION: NAD, using SPC   SYMPTOM BEHAVIOR:  Subjective history: see above  Non-Vestibular symptoms: blurred vision  Type of dizziness: woozy  Frequency: 5x in past week   Duration: few minutes   Aggravating factors: Spontaneous and Induced by motion: bending down to the ground  Relieving factors: no known relieving factors  Progression of symptoms: unchanged  OCULOMOTOR  EXAM:  Ocular Alignment: normal  Ocular ROM: No Limitations  Spontaneous Nystagmus: absent  Gaze-Induced Nystagmus: absent  Smooth Pursuits: saccades in vertical plane, L visual field >R  Saccades: dysmetria  Convergence/Divergence: 6 cm   VESTIBULAR - OCULAR REFLEX:   Slow VOR: Normal  VOR Cancellation: Corrective Saccades  Head-Impulse Test: HIT Right: slow HIT Left: slow    POSITIONAL TESTING: Right Roll Test: no nystagmus Left Roll Test: no nystagmus Right Sidelying: no nystagmus Left Sidelying: no nystagmus  MOTION SENSITIVITY:  Motion Sensitivity Quotient Intensity: 0 = none, 1 = Lightheaded, 2 = Mild, 3 = Moderate, 4 = Severe, 5 = Vomiting  Intensity  1. Sitting to supine 0  2. Supine to L side 0  3. Supine to R side 0  4. Supine to sitting 0  5. L Hallpike-Dix (sidelying) 0  6. Up from L  0  7. R Hallpike-Dix (sidelying) 0  8. Up from R  0  9. Sitting, head tipped to L knee 0  10. Head up from L knee 2  11. Sitting, head tipped to  R knee 0  12. Head up from R knee 4  13. Sitting head turns x5 0  14.Sitting head nods x5 0  15. In stance, 180 turn to L    16. In stance, 180 turn to R     MCTSIB: Condition 1: 30s- mild sway Condition 2: 17s                                                                                                                             TREATMENT  Self care/home management:  -ddx, slow increase in activity   PATIENT EDUCATION: Education details: PT POC, exam findings, see above Person educated: Patient Education method: Explanation Education comprehension: verbalized understanding and needs further education  HOME EXERCISE PROGRAM:  GOALS: Goals reviewed with patient? Yes  SHORT TERM GOALS: = LTG based on PT POC length   LONG TERM GOALS: Target date: 09/14/23  Pt will be independent with final HEP for improved activity tolerance and balance  Baseline: to be provided Goal status: INITIAL  2.  Pt will report </=  1/5 for all movements on MSQ to indicate improvement in motion sensitivity and improved activity tolerance.   Baseline: up to 4/5 Goal status: INITIAL  3.  Patient will complete at least 30s on condition 2 of MCTSIB to demonstrate improved balance on solid ground with vision obscured Baseline: 17s Goal status: INITIAL  ASSESSMENT:  CLINICAL IMPRESSION: Patient is an 78 y.o. female who was seen today for physical therapy evaluation and treatment for dizziness. Her vestibular exam is as expected given location of her multiple infarcts. Her presented with a slow HIT B, saccades in vertical plane and impaired smooth pursuits in vertical plane as well. Denies dizziness with any of this. Positional testing all (-) for both nystagmus and dizziness. MSQ reflective of mild motion sensitivity more so in planes where BP might also be impacted. Given her rather sedentary lifestyle, a large amount of her symptoms could be contributed to poor fitness. Her MCTSIB results reflect increased reliance on vision to maintain appropriate balance- increasing her risk  for falling. She would benefit from skilled PT services to address the above mentioned deficits.    OBJECTIVE IMPAIRMENTS: Abnormal gait, cardiopulmonary status limiting activity, decreased activity tolerance, decreased balance, decreased cognition, decreased endurance, decreased knowledge of condition, decreased knowledge of use of DME, difficulty walking, decreased strength, dizziness, and postural dysfunction.   ACTIVITY LIMITATIONS: carrying, bending, stairs, bathing, and locomotion level  PARTICIPATION LIMITATIONS: meal prep, cleaning, interpersonal relationship, and community activity  PERSONAL FACTORS: Age, Fitness, Past/current experiences, and Time since onset of injury/illness/exacerbation are also affecting patient's functional outcome.   REHAB POTENTIAL: Fair time since onset  CLINICAL DECISION MAKING: Evolving/moderate  complexity  EVALUATION COMPLEXITY: Moderate   PLAN:  PT FREQUENCY: 2x/week  PT DURATION: 4 weeks  PLANNED INTERVENTIONS: 97164- PT Re-evaluation, 97750- Physical Performance Testing, 97110-Therapeutic exercises, 97530- Therapeutic activity, [id number]- Neuromuscular re-education, 97535- Self Care, 02859- Manual therapy, [id number]- Gait  training, 02239- Orthotic Initial, [phone]- Orthotic/Prosthetic subsequent, [phone]- Canalith repositioning, Patient/Family education, Balance training, Stair training, Vestibular training, and Visual/preceptual remediation/compensation  PLAN FOR NEXT SESSION: L NMR, static balance EO/EC, habituation for MSQ items, HEP to address above   Delon DELENA Pop, PT Delon DELENA Pop, PT, DPT, CBIS  08/13/2023, 11:53 AM

## 2023-08-15 ENCOUNTER — Ambulatory Visit

## 2023-08-15 VITALS — BP 162/67 | HR 63

## 2023-08-15 DIAGNOSIS — R42 Dizziness and giddiness: Secondary | ICD-10-CM

## 2023-08-15 DIAGNOSIS — R2681 Unsteadiness on feet: Secondary | ICD-10-CM

## 2023-08-15 NOTE — Therapy (Signed)
 OUTPATIENT PHYSICAL THERAPY VESTIBULAR TREATMENT     Patient Name: Michelle Lopez MRN: [id number] DOB:02/16/1945, 78 y.o., female Today's Date: 08/15/2023  END OF SESSION:  PT End of Session - 08/15/23 1021     Visit Number 2    Number of Visits 9    Date for PT Re-Evaluation 09/14/23    Authorization Type UHC Dual -no auth reqd    PT Start Time 1016    PT Stop Time 1100    PT Time Calculation (min) 44 min    Equipment Utilized During Treatment Gait belt    Activity Tolerance Patient tolerated treatment well    Behavior During Therapy WFL for tasks assessed/performed          Past Medical History:  Diagnosis Date   Cerebrovascular disease, unspecified    Cocaine abuse, unspecified    Diabetes mellitus    type 2, uncontrolled   Diarrhea    Essential hypertension, benign    Hyperlipidemia    Stroke (HCC)    x7   Past Surgical History:  Procedure Laterality Date   TOTAL HIP ARTHROPLASTY  1970   Dr. Oneil Herald   Patient Active Problem List   Diagnosis Date Noted   Acute bilateral low back pain without sciatica 07/26/2023   Lower back injury, initial encounter 07/26/2023   Impacted cerumen of both ears 07/24/2023   Insulin -requiring or dependent type II diabetes mellitus (HCC) 02/13/2023   Essential hypertension, benign 11/12/2022   Need for prophylactic vaccination and inoculation against varicella 06/13/2021   Need for prophylactic vaccination with combined diphtheria-tetanus-pertussis (DTP) vaccine 06/13/2021   Bilateral hearing loss 05/02/2021   Stage 3b chronic kidney disease (HCC) 04/14/2020   Tinea corporis 12/17/2018   Protein-calorie malnutrition, mild (HCC) 10/17/2017   B12 deficiency 08/23/2017   Primary osteoarthritis of right hip 04/03/2017   PAD (peripheral artery disease) (HCC) 07/24/2016   Vitamin D  deficiency 07/06/2015   Osteopenia, senile 03/03/2014   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 03/21/2013   Seborrheic dermatitis 03/05/2013    Dysphagia as late effect of stroke 05/30/2011   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 Denies falls. Does have significant redness to L eye- denies pain/injury.   Pt accompanied by: family member  PERTINENT HISTORY: cocaine abuse, DMII, HTN, HLD, CVA x7  PAIN:  Are you having pain? No  PRECAUTIONS: Fall   PATIENT GOALS: just walking I guess  OBJECTIVE:  Note: Objective measures were completed at Evaluation unless otherwise noted.  DIAGNOSTIC FINDINGS: 03/27/23 brain MRI  IMPRESSION: 1. No acute intracranial abnormality. 2. 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    VESTIBULAR ASSESSMENT:  GENERAL OBSERVATION: NAD, using SPC   SYMPTOM BEHAVIOR:  Subjective history: see above  Non-Vestibular symptoms: blurred vision  Type of dizziness: woozy  Frequency: 5x in past week   Duration: few minutes   Aggravating factors: Spontaneous and Induced by  motion: bending down to the ground  Relieving factors: no known relieving factors  Progression of symptoms: unchanged  OCULOMOTOR EXAM:  Ocular Alignment: normal  Ocular ROM: No Limitations  Spontaneous Nystagmus: absent  Gaze-Induced Nystagmus: absent  Smooth Pursuits: saccades in vertical plane, L visual field >R  Saccades: dysmetria  Convergence/Divergence: 6 cm   VESTIBULAR - OCULAR REFLEX:    Slow VOR: Normal  VOR Cancellation: Corrective Saccades  Head-Impulse Test: HIT Right: slow HIT Left: slow    POSITIONAL TESTING: Right Roll Test: no nystagmus Left Roll Test: no nystagmus Right Sidelying: no nystagmus Left Sidelying: no nystagmus  MOTION SENSITIVITY:  Motion Sensitivity Quotient Intensity: 0 = none, 1 = Lightheaded, 2 = Mild, 3 = Moderate, 4 = Severe, 5 = Vomiting  Intensity  1. Sitting to supine 0  2. Supine to L side 0  3. Supine to R side 0  4. Supine to sitting 0  5. L Hallpike-Dix (sidelying) 0  6. Up from L  0  7. R Hallpike-Dix (sidelying) 0  8. Up from R  0  9. Sitting, head tipped to L knee 0  10. Head up from L knee 2  11. Sitting, head tipped to R knee 0  12. Head up from R knee 4  13. Sitting head turns x5 0  14.Sitting head nods x5 0  15. In stance, 180 turn to L    16. In stance, 180 turn to R     MCTSIB: Condition 1: 30s- mild sway Condition 2: 17s                                                                                                                             TREATMENT  NMR: Renda Chart seated  -impaired saccades -> L primarily   -regressed to no cog dual task of just seated saccades with 2 X's -habituation:   -seated bending down placing bean bags on cones placed on floor Standing Balance: Surface: Airex and Floor Position: Narrow Base of Support Feet Hip Width Apart Completed with: Eyes Open and Eyes Closed; Head Turns x 10 Reps and Head Nods x 10 Reps   PATIENT EDUCATION: Education details: initial HEP Person educated: Patient Education method: Explanation Education comprehension: verbalized understanding and needs further education  HOME EXERCISE PROGRAM: Access Code: [email or web address] URL: [email or web address] Date: 08/15/2023 Prepared by: Delon Pop  Exercises - Seated Horizontal Saccades  - 1 x daily - 7 x weekly - 3 sets - 30s hold - Sit to Stand with Counter Support  - 1 x daily -  7 x weekly - 3 sets - 5 reps  GOALS: Goals reviewed with patient? Yes  SHORT TERM GOALS: = LTG based on PT POC length   LONG TERM GOALS: Target date: 09/14/23  Pt will be independent with final HEP for improved activity tolerance and balance  Baseline: to be provided Goal status: INITIAL  2.  Pt will report </= 1/5 for all  movements on MSQ to indicate improvement in motion sensitivity and improved activity tolerance.   Baseline: up to 4/5 Goal status: INITIAL  3.  Patient will complete at least 30s on condition 2 of MCTSIB to demonstrate improved balance on solid ground with vision obscured Baseline: 17s Goal status: INITIAL  ASSESSMENT:  CLINICAL IMPRESSION: Patient seen for skilled PT session with emphasis on balance retraining and habituation. Noted to have difficulty completing Shiela Chart task without substantial verbal cues noting impaired saccades resulting in reports of mild dizziness and imbalance increasing her risk for falls. She also presents with increased visual reliance to maintain balance, which this coupled with impaired saccades, even further increases her risk for falls and morbidity. Continue POC.   OBJECTIVE IMPAIRMENTS: Abnormal gait, cardiopulmonary status limiting activity, decreased activity tolerance, decreased balance, decreased cognition, decreased endurance, decreased knowledge of condition, decreased knowledge of use of DME, difficulty walking, decreased strength, dizziness, and postural dysfunction.   ACTIVITY LIMITATIONS: carrying, bending, stairs, bathing, and locomotion level  PARTICIPATION LIMITATIONS: meal prep, cleaning, interpersonal relationship, and community activity  PERSONAL FACTORS: Age, Fitness, Past/current experiences, and Time since onset of injury/illness/exacerbation are also affecting patient's functional outcome.   REHAB POTENTIAL: Fair time since onset  CLINICAL DECISION MAKING: Evolving/moderate complexity  EVALUATION  COMPLEXITY: Moderate   PLAN:  PT FREQUENCY: 2x/week  PT DURATION: 4 weeks  PLANNED INTERVENTIONS: 97164- PT Re-evaluation, 97750- Physical Performance Testing, 97110-Therapeutic exercises, 97530- Therapeutic activity, [id number]- Neuromuscular re-education, 97535- Self Care, 02859- Manual therapy, [id number]- Gait training, [phone]- Orthotic Initial, [phone]- Orthotic/Prosthetic subsequent, [phone]- Canalith repositioning, Patient/Family education, Balance training, Stair training, Vestibular training, and Visual/preceptual remediation/compensation  PLAN FOR NEXT SESSION: L NMR, static balance EO/EC, habituation for MSQ items, decreasing L pelvic posterior rotation   Delon DELENA Pop, PT Delon DELENA Pop, PT, DPT, CBIS  08/15/2023, 12:39 PM

## 2023-08-16 ENCOUNTER — Ambulatory Visit: Admitting: Audiology

## 2023-08-16 DIAGNOSIS — R42 Dizziness and giddiness: Secondary | ICD-10-CM | POA: Diagnosis not present

## 2023-08-16 DIAGNOSIS — H903 Sensorineural hearing loss, bilateral: Secondary | ICD-10-CM

## 2023-08-16 NOTE — Procedures (Signed)
 Outpatient Audiology and [facility] [address] [place], [place]  [address] [phone]  AUDIOLOGICAL  EVALUATION  NAME: Shefali Ng     DOB:   [birth year]      MRN: [id number]                                                                                     DATE: 08/16/2023     REFERENT: Joshua Debby CROME, MD STATUS: Outpatient DIAGNOSIS: sensorineural hearing loss, bilateral    History: Eltha was seen for an audiological evaluation due to decreased hearing. She was accompanied to the appointment by her daughter. Keali's medical history is significant for Type 2 diabetes mellitus with diabetic nephropathy and stage 3b chronic kidney disease. Saisha reports a decrease in hearing occurring for a few months. Leather's daughter reports they are ready to pursue amplification for Marvette. Nastassia was last seen at [facility] on 06/16/21 at which time test results were consistent with a mild to moderately severe sensorineural hearing loss in the left ear and a moderate to severe mixed hearing loss in the right ear. Asymmetry noted at [phone], worse in the right ear. Asymmetric word recognition noted, worse in the right ear.   Evaluation:  Otoscopy showed a clear view of the tympanic membranes, bilaterally Tympanometry results were consistent with normal middle ear pressure and normal tympanic membrane mobility (Type A), bilaterally.  Audiometric testing was completed using Conventional Audiometry techniques with insert earphones and TDH headphones. Test results are consistent in the left ear with a mild to moderately-severe sensorineural hearing loss with a mixed component noted at 4000 Hz and consistent in the right ear with a moderate to severe sensorineural hearing loss with a mixed component noted at 4000 Hz. Speech Recognition Thresholds were obtained at 60 dB HL in the right ear and at 45  dB HL in the left ear. Word Recognition Testing was completed at 90 dB HL and Pura  scored 84% in the right ear and was completed at 75 dB HL and Vera scored 96% in the left ear.    Results:  The test results were reviewed with Karime and her daughter. Test results are consistent in the left ear with a mild to moderately-severe sensorineural hearing loss with a mixed component noted at 4000 Hz and consistent in the right ear with a moderate to severe sensorineural hearing loss with a mixed component noted at 4000 Hz. There is an asymmetry noted at [phone] worse in the right ear. In comparison to the last evaluation on 06/16/21, thresholds in the left ear are stable and thresholds in the right ear are mostly stable but have decreased at 1000-1500 Hz and 4000 Hz.  Evonne will have communication difficulty in all listening environments. She will benefit from the use of amplification and good communication strategies. A referral to an ENT was reviewed due to asymmetry. The test results and recommendations were reviewed with Laylonie and her daughter.   Recommendations: Referral to an ENT due to asymmetric hearing loss.  Use of Amplification. Latanya was given a handout with Hearing Aid Providers in the [place] area. Reve was given a handout for [place]  [facility]care Hearing Aids   30 minutes spent testing and counseling on results.   If you have any questions please feel free to contact me at [phone].  Darryle Posey Audiologist, Au.D., CCC-A 08/16/2023  2:59 PM  Cc: Joshua Debby CROME, MD

## 2023-08-17 ENCOUNTER — Other Ambulatory Visit: Payer: Self-pay

## 2023-08-17 NOTE — Patient Outreach (Signed)
 Complex Care Management   Visit Note  08/17/2023  Name:  Michelle Lopez MRN: [id number] DOB: 1945-02-21  Situation: Referral received for Complex Care Management related to SDOH Barriers:  Housing   Financial Resource Strain I obtained verbal consent from Caregiver.  Visit completed with caregiver  on the phone  Background:   Past Medical History:  Diagnosis Date   Cerebrovascular disease, unspecified    Cocaine abuse, unspecified    Diabetes mellitus    type 2, uncontrolled   Diarrhea    Essential hypertension, benign    Hyperlipidemia    Stroke (HCC)    x7    Assessment: BSW outreached patients caregiver to assist with SDOH barriers. Social worker identified barriers with housing, utilities and financial strain. Social worker has sent caregiver resources to patient/caregiver and has scheduled a follow up call with them on 08/13/23 at 1pm.  SDOH Interventions Today    Flowsheet Row Most Recent Value  SDOH Interventions   Housing Interventions Community Resources Provided  Utilities Interventions Community Resources Provided  Financial Strain Interventions Community Resources Provided      Recommendation:   No recommendations at this time.  Follow Up Plan:   Telephone follow-up 08/13/23 at 1pm  Orlean Fey, BSW [place]  [facility] Social Worker, [employer] [facility] [phone]

## 2023-08-17 NOTE — Patient Instructions (Signed)
 Visit Information  Thank you for taking time to visit with me today. Please don't hesitate to contact me if I can be of assistance to you before our next scheduled appointment.  Our next appointment is by telephone on 08/13/23 at 1pm Please call the care guide team at [phone] if you need to cancel or reschedule your appointment.      Please call the Suicide and Crisis Lifeline: 988 call the [place]  National Suicide Prevention Lifeline: 601-772-1890 or TTY: 5070577953 TTY (310) 103-4918) to talk to a trained counselor call 1-800-273-TALK (toll free, 24 hour hotline) go to [facility] [address] [place] [phone]) call 911 if you are experiencing a Mental Health or Behavioral Health Crisis or need someone to talk to.  Patient verbalizes understanding of instructions and care plan provided today and agrees to view in MyChart. Active MyChart status and patient understanding of how to access instructions and care plan via MyChart confirmed with patient.     Orlean Fey, BSW [place]  [facility] Social Worker, [employer] [facility] [phone]

## 2023-08-20 ENCOUNTER — Ambulatory Visit

## 2023-08-21 ENCOUNTER — Telehealth: Payer: Self-pay | Admitting: Internal Medicine

## 2023-08-21 ENCOUNTER — Other Ambulatory Visit: Payer: Self-pay

## 2023-08-21 NOTE — Patient Instructions (Addendum)
 Visit Information  Thank you for taking time to visit with me today. Please don't hesitate to contact me if I can be of assistance to you before our next scheduled appointment.  Our next appointment is by telephone on 09/21/23 at 2:00 pm Please call the care guide team at [phone] if you need to cancel or reschedule your appointment.   Following is a copy of your care plan:   Goals Addressed             This Visit's Progress    VBCI RN Care Plan       Problems:  Care Coordination needs related to Financial Strain  and Housing  Chronic Disease Management support and education needs related to Dementia  Goal: Over the next 90 days the Caregiver will continue to work with Medical illustrator and/or Social Worker to address care management and care coordination needs related to Dementia as evidenced by adherence to care management team scheduled appointments     take all medications exactly as prescribed and will call provider for medication related questions as evidenced by patient/caregiver report and/or chart review    work with Child psychotherapist to address Financial constraints related to limited finances and Housing barriers related to the management of available resources as evidenced by review of electronic medical record and patient or social worker report     Patient caregiver will verbalize receipt available resources provided by Child psychotherapist.  Interventions:  Dementia Interventions: Assessed medication management and Reviewed medications with caregiver Evaluated fall risk with patient's daughter/caregiver Reviewed upcoming appointment appointment with daughter/caregiver Assessed if any home safety concerns Provide education regarding safety in the home Provided education via Emmi email: common behavior changes with dementia: overview; dementia basics: what can you expect?; dementia caregiver: caregiver support. SDOH Barriers Patient interviewed and SDOH assessment performed         SDOH Interventions    Flowsheet Row Patient Outreach Telephone from 08/08/2023 in [place] [facility] Clinical Support from 08/01/2023 in [facility] Clinical Support from 11/08/2021 in [facility] [place]  SDOH Interventions     Food Insecurity Interventions Intervention Not Indicated Intervention Not Indicated Intervention Not Indicated  Housing Interventions Other (Comment)  [BSW to call] AMB Referral Intervention Not Indicated  Transportation Interventions -- Intervention Not Indicated Intervention Not Indicated  Utilities Interventions Other (Comment)  [VBCI BSW] AMB Referral Intervention Not Indicated  Alcohol  Usage Interventions -- Intervention Not Indicated (Score <7) Intervention Not Indicated (Score <7)  Financial Strain Interventions -- [employer] Provided Intervention Not Indicated  Physical Activity Interventions -- Intervention Not Indicated Patient Refused, Other (Comments)  Stress Interventions -- Intervention Not Indicated Intervention Not Indicated  Social Connections Interventions -- Intervention Not Indicated Intervention Not Indicated  Health Literacy Interventions -- Intervention Not Indicated --   Patient interviewed and appropriate assessments performed Discussed plans with patient for ongoing care management follow up and provided patient with direct contact information for care management team Advised patient to be answer the phone for the VBCI social worker scheduled for 08/09/2023. Collaborated with caregiver and care guide to reschedule telephone follow up for 08/09/2023 Patient Self-Care Activities:  Attend all scheduled provider appointments Call provider office for new concerns or questions  Take medications as prescribed   Work with the social worker to address care coordination needs and will continue to work with the clinical team to address health care and disease  management related needs  Plan:  Telephone follow up appointment with care management team member scheduled for:  09/21/23 at 2:00 pm           Please call the Suicide and Crisis Lifeline: 988 call the [place]  National Suicide Prevention Lifeline: 6104559186 or TTY: (671)497-8554 TTY (951)108-6467) to talk to a trained counselor if you are experiencing a Mental Health or Behavioral Health Crisis or need someone to talk to.  The patient verbalized understanding of instructions, educational materials, and care plan provided today and DECLINED offer to receive copy of patient instructions, educational materials, and care plan.   Heddy Shutter, RN, MSN, BSN, CCM [place]  [facility], Population Health Case Manager Phone: [phone]

## 2023-08-21 NOTE — Patient Outreach (Signed)
 Complex Care Management   Visit Note  08/21/2023  Name:  Michelle Lopez MRN: [id number] DOB: May 20, 1945  Situation: Referral received for Complex Care Management related to SDOH Barriers: Housing Financial Resource Strain, Dementia I obtained verbal consent from Patient.  Visit completed with patient  on the phone  Background:   Past Medical History:  Diagnosis Date   Cerebrovascular disease, unspecified    Cocaine abuse, unspecified    Diabetes mellitus    type 2, uncontrolled   Diarrhea    Essential hypertension, benign    Hyperlipidemia    Stroke (HCC)    x7    Assessment: Patient Reported Symptoms:  Cognitive Cognitive Status: Requires Assistance Decision Making (history of vascular dementia. daughter manages patient's health care needs.)      Neurological Neurological Review of Symptoms: No symptoms reported    HEENT HEENT Symptoms Reported: Change or loss of hearing (per daughter, patient had a hearing test on yesterday and is awaiting hearing aids)      Cardiovascular Cardiovascular Symptoms Reported: No symptoms reported    Respiratory Respiratory Symptoms Reported: No symptoms reported    Endocrine Endocrine Symptoms Reported: No symptoms reported Is patient diabetic?: Yes Is patient checking blood sugars at home?: Yes (daughter reports checks blood sugar daily) List most recent blood sugar readings, include date and time of day: per daughter, has not checked patient's blood sugar today, but report yesterday blood sugar was 103. she denies any low blood sugars.    Gastrointestinal Gastrointestinal Symptoms Reported: No symptoms reported Additional Gastrointestinal Details: daughter reports patient is eating well. daughter reports patient started taking dranabinol about a week ago.      Genitourinary Genitourinary Symptoms Reported: Incontinence Genitourinary Management Strategies: Incontinence garment/pad  Integumentary Integumentary Symptoms Reported: No  symptoms reported    Musculoskeletal Musculoskelatal Symptoms Reviewed: Limited mobility Additional Musculoskeletal Details: patient is engaged with outpatient rehab therapy for increased muscle strength/tone mobilization        Psychosocial Psychosocial Symptoms Reported: Not assessed            08/01/2023    2:15 PM  Depression screen PHQ 2/9  Decreased Interest 0  Down, Depressed, Hopeless 0  PHQ - 2 Score 0  Altered sleeping 0  Tired, decreased energy 0  Change in appetite 0  Feeling bad or failure about yourself  0  Trouble concentrating 0  Moving slowly or fidgety/restless 3  Suicidal thoughts 0  PHQ-9 Score 3  Difficult doing work/chores Extremely dIfficult    There were no vitals filed for this visit.  Medications Reviewed Today     Reviewed by Bayron Dalto M, RN (Registered Nurse) on 08/21/23 at 1701  Med List Status: <None>   Medication Order Taking? Sig Documenting Provider Last Dose Status Informant  aspirin  81 MG tablet [id number] Yes Take 81 mg by mouth daily. [provider]  Active Child  atorvastatin  (LIPITOR) 20 MG tablet [id number] Yes TAKE 1 TABLET BY MOUTH EVERY DAY Joshua Debby CROME, MD  Active   Blood Glucose Monitoring Suppl (CONTOUR NEXT EZ) w/Device KIT [id number]  1 Act by Does not apply route 2 (two) times daily. Joshua Debby CROME, MD  Active Child  cholecalciferol (VITAMIN D3) 25 MCG (1000 UNIT) tablet [id number] Yes Take 2,000 Units by mouth daily. [provider]  Active Child  clopidogrel  (PLAVIX ) 75 MG tablet [id number] Yes TAKE 1 TABLET BY MOUTH ONCE DAILY Joshua Debby CROME, MD  Active   Continuous Glucose Receiver [facility] G7 RECEIVER) ESPIRIDION [id number]  1 Act by Does not apply route daily.  Patient not taking: Reported on 08/21/2023   Joshua Debby CROME, MD  Active Child  Continuous Glucose Sensor [facility] G7 [place]) [place] [id number]  1 Act by Does not apply route daily.  Patient not taking: Reported on 08/21/2023   Joshua Debby CROME, MD   Active Child  Cyanocobalamin  (VITAMIN B 12 PO) [id number] Yes Take by mouth. [provider]  Active Child  FARXIGA  10 MG TABS tablet [id number] Yes TAKE 1 TABLET BY MOUTH ONCE DAILY Joshua Debby CROME, MD  Active   Glucagon  (GVOKE HYPOPEN  2-PACK) 1 MG/0.2ML EMMANUEL [id number]  Inject 1 Act into the skin daily as needed. Joshua Debby CROME, MD  Active Child  insulin  glargine, 2 Unit Dial , (TOUJEO  MAX SOLOSTAR) 300 UNIT/ML Solostar Pen [id number] Yes Inject 10 Units into the skin daily. Joshua Debby CROME, MD  Active Child  metoprolol  tartrate (LOPRESSOR ) 25 MG tablet [id number] Yes TAKE 1/2 TABLET BY MOUTH 2 TIMES DAILY Joshua Debby CROME, MD  Active   mirtazapine  (REMERON ) 7.5 MG tablet [id number] Yes Take 1 tablet (7.5 mg total) by mouth at bedtime. Joshua Debby CROME, MD  Active   OneTouch UltraSoft 2 Lancets MISC [id number]  USE TO CHECK BLOOD SUGAR 2 TIMES DAILY Joshua Debby CROME, MD  Active Child  tamsulosin  (FLOMAX ) 0.4 MG CAPS capsule [id number] Yes TAKE 1 CAPSULE BY MOUTH EVERY MORNING Joshua Debby CROME, MD  Active Child  TRUEPLUS 5-BEVEL PEN NEEDLES 31G X 6 MM MISC [id number]  USE TO INJECT insulin  UP TO 5 TIMES DAILY Joshua Debby CROME, MD  Active Child          Recommendation:   Continue Current Plan of Care  Follow Up Plan:   Telephone follow up appointment date/time:  09/21/23 at 2:00 pm  Heddy Shutter, RN, MSN, BSN, CCM [place]  [facility], Population Health Case Manager Phone: [phone]

## 2023-08-21 NOTE — Telephone Encounter (Signed)
 Copied from CRM [id number]. Topic: Clinical - Medication Question >> Aug 21, 2023 11:46 AM Deleta RAMAN wrote: Reason for CRM: patient daughter Is calling due to Nurse wallace telling her to return call about medication she is taking that is not being noted on chart or matching what the office has on file

## 2023-08-22 ENCOUNTER — Ambulatory Visit (INDEPENDENT_AMBULATORY_CARE_PROVIDER_SITE_OTHER): Admitting: Podiatry

## 2023-08-22 DIAGNOSIS — Z91198 Patient's noncompliance with other medical treatment and regimen for other reason: Secondary | ICD-10-CM

## 2023-08-24 ENCOUNTER — Encounter: Payer: Self-pay | Admitting: Physical Therapy

## 2023-08-24 ENCOUNTER — Ambulatory Visit: Admitting: Physical Therapy

## 2023-08-24 VITALS — BP 188/82 | HR 65

## 2023-08-24 DIAGNOSIS — R2681 Unsteadiness on feet: Secondary | ICD-10-CM

## 2023-08-24 DIAGNOSIS — R42 Dizziness and giddiness: Secondary | ICD-10-CM | POA: Diagnosis not present

## 2023-08-24 NOTE — Therapy (Signed)
 OUTPATIENT PHYSICAL THERAPY VESTIBULAR TREATMENT     Patient Name: Michelle Lopez MRN: [id number] DOB:September 14, 1945, 78 y.o., female Today's Date: 08/24/2023  END OF SESSION:  PT End of Session - 08/24/23 1021     Visit Number 3    Number of Visits 9    Date for PT Re-Evaluation 09/14/23    Authorization Type U[employer] Dual -no auth reqd    PT Start Time 1022    PT Stop Time 1101    PT Time Calculation (min) 39 min    Equipment Utilized During Treatment Gait belt    Activity Tolerance Patient tolerated treatment well    Behavior During Therapy WFL for tasks assessed/performed          Past Medical History:  Diagnosis Date   Cerebrovascular disease, unspecified    Cocaine abuse, unspecified    Diabetes mellitus    type 2, uncontrolled   Diarrhea    Essential hypertension, benign    Hyperlipidemia    Stroke (HCC)    x7   Past Surgical History:  Procedure Laterality Date   TOTAL HIP ARTHROPLASTY  1970   Dr. Oneil Herald   Patient Active Problem List   Diagnosis Date Noted   Acute bilateral low back pain without sciatica 07/26/2023   Lower back injury, initial encounter 07/26/2023   Impacted cerumen of both ears 07/24/2023   Insulin -requiring or dependent type II diabetes mellitus (HCC) 02/13/2023   Essential hypertension, benign 11/12/2022   Need for prophylactic vaccination and inoculation against varicella 06/13/2021   Need for prophylactic vaccination with combined diphtheria-tetanus-pertussis (DTP) vaccine 06/13/2021   Bilateral hearing loss 05/02/2021   Stage 3b chronic kidney disease (HCC) 04/14/2020   Tinea corporis 12/17/2018   Protein-calorie malnutrition, mild (HCC) 10/17/2017   B12 deficiency 08/23/2017   Primary osteoarthritis of right hip 04/03/2017   PAD (peripheral artery disease) (HCC) 07/24/2016   Vitamin D  deficiency 07/06/2015   Osteopenia, senile 03/03/2014   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 03/21/2013   Seborrheic dermatitis  03/05/2013   Dysphagia as late effect of stroke 05/30/2011   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 No changes of note since last session. Denies falls / close calls since last session. HEP is going good. Pt reports blurred vision in L eye - PT noted redness.     Pt accompanied by: family member  PERTINENT HISTORY: cocaine abuse, DMII, HTN, HLD, CVA x7  PAIN:  Are you having pain? No  PRECAUTIONS: Fall   PATIENT GOALS: just walking I guess  OBJECTIVE:  Note: Objective measures were completed at Evaluation unless otherwise noted.  DIAGNOSTIC FINDINGS: 03/27/23 brain MRI  IMPRESSION: 1. No acute intracranial abnormality. 2. 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    VESTIBULAR ASSESSMENT:  GENERAL OBSERVATION: NAD, using SPC   SYMPTOM BEHAVIOR:  Subjective history: see above  Non-Vestibular symptoms: blurred vision  Type of dizziness: woozy  Frequency: 5x in past week   Duration: few minutes   Aggravating factors: Spontaneous and Induced by motion: bending down to the ground  Relieving factors: no known relieving factors  Progression of symptoms: unchanged  OCULOMOTOR EXAM:  Ocular Alignment: normal  Ocular ROM: No Limitations  Spontaneous Nystagmus: absent  Gaze-Induced Nystagmus: absent  Smooth Pursuits: saccades in  vertical plane, L visual field >R  Saccades: dysmetria  Convergence/Divergence: 6 cm   VESTIBULAR - OCULAR REFLEX:   Slow VOR: Normal  VOR Cancellation: Corrective Saccades  Head-Impulse Test: HIT Right: slow HIT Left: slow    POSITIONAL TESTING: Right Roll Test: no nystagmus Left Roll Test: no nystagmus Right Sidelying: no nystagmus Left Sidelying: no nystagmus  MOTION SENSITIVITY:  Motion Sensitivity Quotient Intensity: 0 = none, 1 = Lightheaded, 2 = Mild, 3 = Moderate, 4 = Severe, 5 = Vomiting  Intensity  1. Sitting to supine 0  2. Supine to L side 0  3. Supine to R side 0  4. Supine to sitting 0  5. L Hallpike-Dix (sidelying) 0  6. Up from L  0  7. R Hallpike-Dix (sidelying) 0  8. Up from R  0  9. Sitting, head tipped to L knee 0  10. Head up from L knee 2  11. Sitting, head tipped to R knee 0  12. Head up from R knee 4  13. Sitting head turns x5 0  14.Sitting head nods x5 0  15. In stance, 180 turn to L    16. In stance, 180 turn to R     MCTSIB: Condition 1: 30s- mild sway Condition 2: 17s                                                                                                                             TREATMENT   Vitals:   08/24/23 1033  BP: (!) 188/82  Pulse: 65  *sitting (start of session) *pt denies any increased dizziness/lightheadedness from baseline *pt reports slight HA Discussion surrounding taking BP medication as prescribed before PT treatment sessions Instruction to take prescribed BP medication immediately following treatment session Pt was not appropriate to participate in skilled PT services today due to high SBP with associated HA  Discussion surrounding L eye redness that has persisted for >1 week with pt reported recent double vision PT urged pt to follow-up with Dr. Joshua (PCP) as soon as possible for further assessment Pt verbalized understanding and agreeable; PT will follow-up next session Education on regular  self-monitoring BP at home and keeping a log for readings to provide Dr. Joshua (PCP), especially with reported dizziness/unsteadiness on feet  PT provided pt copy of [employer] link to BP cuff for home use Education on following up with Dr. Joshua (PCP) in regards to high SBP, if it continues to run consistently high Pt verbalized understanding and agreeable; PT will follow-up next session   PATIENT EDUCATION: Education details: see  above, continue HEP as tolerated, BP cuff for home use, self-care Person educated: Patient Education method: Explanation and Handouts Education comprehension: verbalized understanding and needs further education  HOME EXERCISE PROGRAM: Access Code: [email or web address] URL: [email or web address] Date: 08/15/2023 Prepared by: Delon Pop  Exercises - Seated Horizontal Saccades  - 1 x daily - 7 x weekly - 3 sets - 30s hold - Sit to Stand with Counter Support  - 1 x daily - 7 x weekly - 3 sets - 5 reps  GOALS: Goals reviewed with patient? Yes  SHORT TERM GOALS: = LTG based on PT POC length   LONG TERM GOALS: Target date: 09/14/23  Pt will be independent with final HEP for improved activity tolerance and balance  Baseline: to be provided Goal status: INITIAL  2.  Pt will report </= 1/5 for all movements on MSQ to indicate improvement in motion sensitivity and improved activity tolerance.   Baseline: up to 4/5 Goal status: INITIAL  3.  Patient will complete at least 30s on condition 2 of MCTSIB to demonstrate improved balance on solid ground with vision obscured Baseline: 17s Goal status: INITIAL  ASSESSMENT:  CLINICAL IMPRESSION:  Pt seen for skilled PT treatment session with an emphasis on education on medical complexities, particularly BP management and L eye redness with recent double vision. Session was limited and ultimately terminated due to extremely elevated seated SBP reading with associated mild HA (sitting: 188/82). Pt reported not taking  her BP medication prior to PT treatment session arrival; PT instructed pt to take BP medication as prescribed before PT treatment sessions. Education was also given on regular self-monitoring BP at home and keeping a log for readings to provide Dr. Joshua (PCP), especially with reported dizziness/unsteadiness on feet PT urged the pt to consult PCP to address the L eye redness and recent double vision; pt verbalized understanding and agreeable. PT provided pt handouts to give to her daughter (caregiver), including the discussion surrounding BP management and L eye redness along with a copy of an Amazon link to purchase a BP cuff for home. Continue POC as able.   OBJECTIVE IMPAIRMENTS: Abnormal gait, cardiopulmonary status limiting activity, decreased activity tolerance, decreased balance, decreased cognition, decreased endurance, decreased knowledge of condition, decreased knowledge of use of DME, difficulty walking, decreased strength, dizziness, and postural dysfunction.   ACTIVITY LIMITATIONS: carrying, bending, stairs, bathing, and locomotion level  PARTICIPATION LIMITATIONS: meal prep, cleaning, interpersonal relationship, and community activity  PERSONAL FACTORS: Age, Fitness, Past/current experiences, and Time since onset of injury/illness/exacerbation are also affecting patient's functional outcome.   REHAB POTENTIAL: Fair time since onset  CLINICAL DECISION MAKING: Evolving/moderate complexity  EVALUATION COMPLEXITY: Moderate   PLAN:  PT FREQUENCY: 2x/week  PT DURATION: 4 weeks  PLANNED INTERVENTIONS: 97164- PT Re-evaluation, 97750- Physical Performance Testing, 97110-Therapeutic exercises, 97530- Therapeutic activity, [id number]- Neuromuscular re-education, 97535- Self Care, [id number]- Manual therapy, [id number]- Gait training, [phone]- Orthotic Initial, [phone]- Orthotic/Prosthetic subsequent, [phone]- Canalith repositioning, Patient/Family education, Balance training, Stair training, Vestibular  training, and Visual/preceptual remediation/compensation  PLAN FOR NEXT SESSION: Assess BP, follow-up about L eye redness, L NMR, static balance EO/EC, habituation for MSQ items, decreasing L pelvic posterior rotation   Waddell Nailer, Student-PT  08/24/2023, 11:26 AM

## 2023-08-24 NOTE — Patient Instructions (Signed)
 PT noted redness in L eye at PT treatment session on 08/15/23 that has persisted to today's treatment session on 08/24/23 with pt reporting recent double vision PT recommendation to follow-up with Dr. Joshua (PCP) for further assessment BP reading in seated position at the start of the session: 188/82  Pt states she did not take her BP medication prior to appointment arrival  PT deemed it unsafe to proceed with PT treatment session d/t extremely elevated SBP and symptomatic  Education was provided surrounding daily BP self-monitoring at home in order to participate in skilled PT services

## 2023-08-26 NOTE — Progress Notes (Signed)
 1. Failure to attend appointment with reason given   Appointment rescheduled.

## 2023-08-27 ENCOUNTER — Telehealth: Payer: Self-pay

## 2023-08-27 ENCOUNTER — Ambulatory Visit

## 2023-08-27 NOTE — Telephone Encounter (Signed)
 This therapist contacted the patient at this time 1202 on this date 08/27/23. And  was unable to reach patient and no voicemail box was established to allow for a HIPAA compliant VM to be left. This is the patients 1st no show for this POC.

## 2023-08-29 ENCOUNTER — Ambulatory Visit

## 2023-08-30 ENCOUNTER — Ambulatory Visit (INDEPENDENT_AMBULATORY_CARE_PROVIDER_SITE_OTHER): Admitting: Internal Medicine

## 2023-08-30 ENCOUNTER — Encounter: Payer: Self-pay | Admitting: Internal Medicine

## 2023-08-30 VITALS — BP 136/74 | HR 63 | Temp 98.1°F | Resp 16 | Ht 63.5 in | Wt 115.4 lb

## 2023-08-30 DIAGNOSIS — E1121 Type 2 diabetes mellitus with diabetic nephropathy: Secondary | ICD-10-CM | POA: Diagnosis not present

## 2023-08-30 DIAGNOSIS — Z0000 Encounter for general adult medical examination without abnormal findings: Secondary | ICD-10-CM | POA: Diagnosis not present

## 2023-08-30 DIAGNOSIS — Z794 Long term (current) use of insulin: Secondary | ICD-10-CM | POA: Diagnosis not present

## 2023-08-30 DIAGNOSIS — N1832 Chronic kidney disease, stage 3b: Secondary | ICD-10-CM | POA: Diagnosis not present

## 2023-08-30 DIAGNOSIS — Z0001 Encounter for general adult medical examination with abnormal findings: Secondary | ICD-10-CM | POA: Insufficient documentation

## 2023-08-30 DIAGNOSIS — E119 Type 2 diabetes mellitus without complications: Secondary | ICD-10-CM | POA: Diagnosis not present

## 2023-08-30 DIAGNOSIS — E785 Hyperlipidemia, unspecified: Secondary | ICD-10-CM | POA: Diagnosis not present

## 2023-08-30 DIAGNOSIS — I10 Essential (primary) hypertension: Secondary | ICD-10-CM

## 2023-08-30 DIAGNOSIS — R300 Dysuria: Secondary | ICD-10-CM

## 2023-08-30 LAB — URINALYSIS, ROUTINE W REFLEX MICROSCOPIC
Bilirubin Urine: NEGATIVE
Hgb urine dipstick: NEGATIVE
Ketones, ur: NEGATIVE
Leukocytes,Ua: NEGATIVE
Nitrite: NEGATIVE
RBC / HPF: NONE SEEN (ref 0–?)
Specific Gravity, Urine: 1.015 (ref 1.000–1.030)
Total Protein, Urine: NEGATIVE
Urine Glucose: >=1000 — AB
Urobilinogen, UA: 0.2 (ref 0.0–1.0)
pH: 6 (ref 5.0–8.0)

## 2023-08-30 LAB — TSH: TSH: 1.98 u[IU]/mL (ref 0.35–5.50)

## 2023-08-30 LAB — IBC + FERRITIN
Ferritin: 39.6 ng/mL (ref 10.0–291.0)
Iron: 63 ug/dL (ref 42–145)
Saturation Ratios: 21.8 % (ref 20.0–50.0)
TIBC: 288.4 ug/dL (ref 250.0–450.0)
Transferrin: 206 mg/dL — ABNORMAL LOW (ref 212.0–360.0)

## 2023-08-30 LAB — MICROALBUMIN / CREATININE URINE RATIO
Creatinine,U: 58.1 mg/dL
Microalb Creat Ratio: 13.4 mg/g (ref 0.0–30.0)
Microalb, Ur: 0.8 mg/dL (ref 0.0–1.9)

## 2023-08-30 MED ORDER — TAMSULOSIN HCL 0.4 MG PO CAPS: 0.4000 mg | ORAL_CAPSULE | Freq: Every morning | ORAL | 1 refills | Status: DC

## 2023-08-30 NOTE — Progress Notes (Signed)
 Subjective:  Patient ID: Michelle Lopez, female    DOB: 08-Feb-1945  Age: 78 y.o. MRN: [id number]  CC: Annual Exam (Discuss something for her iron and wants to know why she's constantly wanting to lay in the bed all day. Fatigued also . ), Hypertension, Urinary Tract Infection, Anemia, and Diabetes   HPI Michelle Lopez presents for a CPX and f/up -----  Her daughter has concerns about frequent and painful urination.  Outpatient Medications Prior to Visit  Medication Sig Dispense Refill   aspirin  81 MG tablet Take 81 mg by mouth daily.     atorvastatin  (LIPITOR) 20 MG tablet TAKE 1 TABLET BY MOUTH EVERY DAY 90 tablet 0   Blood Glucose Monitoring Suppl (CONTOUR NEXT EZ) w/Device KIT 1 Act by Does not apply route 2 (two) times daily. 1 kit 3   cholecalciferol (VITAMIN D3) 25 MCG (1000 UNIT) tablet Take 2,000 Units by mouth daily.     clopidogrel  (PLAVIX ) 75 MG tablet TAKE 1 TABLET BY MOUTH ONCE DAILY 90 tablet 0   Cyanocobalamin  (VITAMIN B 12 PO) Take by mouth.     FARXIGA  10 MG TABS tablet TAKE 1 TABLET BY MOUTH ONCE DAILY 90 tablet 0   Glucagon  (GVOKE HYPOPEN  2-PACK) 1 MG/0.2ML SOAJ Inject 1 Act into the skin daily as needed. 2 mL 5   insulin  glargine, 2 Unit Dial , (TOUJEO  MAX SOLOSTAR) 300 UNIT/ML Solostar Pen Inject 10 Units into the skin daily. 6 mL 0   metoprolol  tartrate (LOPRESSOR ) 25 MG tablet TAKE 1/2 TABLET BY MOUTH 2 TIMES DAILY 90 tablet 0   mirtazapine  (REMERON ) 7.5 MG tablet Take 1 tablet (7.5 mg total) by mouth at bedtime. 90 tablet 0   OneTouch UltraSoft 2 Lancets MISC USE TO CHECK BLOOD SUGAR 2 TIMES DAILY 300 each 1   TRUEPLUS 5-BEVEL PEN NEEDLES 31G X 6 MM MISC USE TO INJECT insulin  UP TO 5 TIMES DAILY 300 each 1   Continuous Glucose Receiver (DEXCOM G7 RECEIVER) DEVI 1 Act by Does not apply route daily. (Patient not taking: Reported on 08/21/2023) 9 each 1   Continuous Glucose Sensor (DEXCOM G7 SENSOR) MISC 1 Act by Does not apply route daily. (Patient not taking: Reported  on 08/21/2023) 9 each 1   tamsulosin  (FLOMAX ) 0.4 MG CAPS capsule TAKE 1 CAPSULE BY MOUTH EVERY MORNING (Patient not taking: Reported on 08/30/2023) 90 capsule 0   No facility-administered medications prior to visit.    ROS Review of Systems  Constitutional:  Positive for chills. Negative for activity change, appetite change and fatigue.  HENT: Negative.    Respiratory: Negative.  Negative for chest tightness, shortness of breath and wheezing.   Cardiovascular:  Negative for chest pain, palpitations and leg swelling.  Gastrointestinal:  Negative for abdominal pain, constipation, diarrhea, nausea and vomiting.  Genitourinary:  Positive for dysuria and frequency. Negative for hematuria and urgency.  Musculoskeletal: Negative.   Skin: Negative.   Neurological:  Negative for dizziness and weakness.  Hematological:  Negative for adenopathy. Does not bruise/bleed easily.    Objective:  BP 136/74 (BP Location: Left Arm, Patient Position: Sitting, Cuff Size: Small)   Pulse 63   Temp 98.1 F (36.7 C) (Oral)   Resp 16   Ht 5' 3.5 (1.613 m)   Wt 115 lb 6.4 oz (52.3 kg)   SpO2 95%   BMI 20.12 kg/m   BP Readings from Last 3 Encounters:  08/30/23 136/74  08/24/23 (!) 188/82  08/15/23 (!) 162/67    [employer]  Readings from Last 3 Encounters:  08/30/23 115 lb 6.4 oz (52.3 kg)  08/01/23 117 lb 6.4 oz (53.3 kg)  07/26/23 115 lb 3.2 oz (52.3 kg)    Physical Exam Vitals reviewed.  Constitutional:      General: She is not in acute distress.    Appearance: She is ill-appearing. She is not toxic-appearing or diaphoretic.  HENT:     Nose: Nose normal.     Mouth/Throat:     Mouth: Mucous membranes are moist.  Eyes:     General: No scleral icterus.    Conjunctiva/sclera: Conjunctivae normal.  Cardiovascular:     Rate and Rhythm: Normal rate and regular rhythm.     Heart sounds: No murmur heard.    No friction rub. No gallop.  Pulmonary:     Effort: Pulmonary effort is normal.     Breath  sounds: No stridor. No wheezing, rhonchi or rales.  Abdominal:     General: Abdomen is flat.     Palpations: There is no mass.     Tenderness: There is no abdominal tenderness. There is no guarding.     Hernia: No hernia is present.  Musculoskeletal:        General: Normal range of motion.     Cervical back: Neck supple.     Right lower leg: No edema.     Left lower leg: No edema.  Lymphadenopathy:     Cervical: No cervical adenopathy.  Skin:    General: Skin is warm and dry.  Neurological:     Mental Status: She is alert. Mental status is at baseline.  Psychiatric:        Mood and Affect: Mood normal.        Behavior: Behavior normal.     Lab Results  Component Value Date   WBC 7.9 03/26/2023   HGB 15.3 (H) 03/26/2023   HCT 49.3 (H) 03/26/2023   PLT 280 03/26/2023   GLUCOSE 123 (H) 07/26/2023   CHOL 179 06/01/2022   TRIG 190.0 (H) 06/01/2022   HDL 37.20 (L) 06/01/2022   LDLDIRECT 100.0 07/06/2015   LDLCALC 104 (H) 06/01/2022   ALT 10 03/26/2023   AST 17 03/26/2023   NA 141 07/26/2023   K 4.1 07/26/2023   CL 102 07/26/2023   CREATININE 1.04 07/26/2023   BUN 18 07/26/2023   CO2 29 07/26/2023   TSH 1.98 08/30/2023   INR 0.9 04/03/2019   HGBA1C 7.6 (H) 07/26/2023   MICROALBUR 0.8 08/30/2023    MR BRAIN WO CONTRAST Result Date: 03/27/2023 CLINICAL DATA:  Acute neurologic deficit EXAM: MRI HEAD WITHOUT CONTRAST TECHNIQUE: Multiplanar, multiecho pulse sequences of the brain and surrounding structures were obtained without intravenous contrast. COMPARISON:  07/05/2014 FINDINGS: Brain: No acute infarct, mass effect or extra-axial collection. No acute or chronic hemorrhage. There is confluent hyperintense T2-weighted signal within the white matter. Parenchymal volume and CSF spaces are normal. Chronic infarcts of the left cerebellum and brainstem. The midline structures are normal. Vascular: Chronic occlusion of the left vertebral artery. Otherwise normal flow voids. Skull  and upper cervical spine: Normal calvarium and skull base. Visualized upper cervical spine and soft tissues are normal. Sinuses/Orbits:No paranasal sinus fluid levels or advanced mucosal thickening. No mastoid or middle ear effusion. Normal orbits. IMPRESSION: 1. No acute intracranial abnormality. 2. 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 Electronically Signed   By: Franky Stanford M.D.   On: 03/27/2023 02:53   CT Head Wo Contrast  Result Date: 03/26/2023 CLINICAL DATA:  Confusion EXAM: CT HEAD WITHOUT CONTRAST TECHNIQUE: Contiguous axial images were obtained from the base of the skull through the vertex without intravenous contrast. RADIATION DOSE REDUCTION: This exam was performed according to the departmental dose-optimization program which includes automated exposure control, adjustment of the mA and/or kV according to patient size and/or use of iterative reconstruction technique. COMPARISON:  01/20/2023 FINDINGS: Brain: No acute CT findings. Extensive old infarction in the left side of the cerebellum. Chronic small-vessel changes of the brainstem, thalami and cerebral hemispheric white matter. No sign of acute infarction, mass lesion, hemorrhage, hydrocephalus or extra-axial collection. Vascular: There is atherosclerotic calcification of the major vessels at the base of the brain. Skull: Negative Sinuses/Orbits: Ordinary seasonal mucosal inflammatory changes. Orbits negative. Other: None IMPRESSION: No acute CT finding. Extensive old infarction in the left side of the cerebellum. Chronic small-vessel changes of the brainstem, thalami and cerebral hemispheric white matter. Electronically Signed   By: Oneil Officer M.D.   On: 03/26/2023 19:48   DG Chest 2 View Result Date: 03/26/2023 CLINICAL DATA:  Flu-like symptoms. EXAM: CHEST - 2 VIEW COMPARISON:  03/19/2023. FINDINGS: Bilateral lung fields are essentially clear. Scattered sub 5 mm calcified granuloma overlying the  bilateral upper mid lung zones are unchanged since the prior CT scan from 12/14/2009. No acute consolidation or lung collapse. No pulmonary edema. Bilateral costophrenic angles are clear. Stable cardio-mediastinal silhouette. No acute osseous abnormalities. The soft tissues are within normal limits. IMPRESSION: No active cardiopulmonary disease. Electronically Signed   By: Ree Molt M.D.   On: 03/26/2023 16:46    Assessment & Plan:  Hyperlipidemia with target LDL less than 70 -     TSH; Future  Dysuria -     Urinalysis, Routine w reflex microscopic; Future -     CULTURE, URINE COMPREHENSIVE; Future  Type 2 diabetes mellitus with diabetic nephropathy, without long-term current use of insulin  (HCC) -     Microalbumin / creatinine urine ratio; Future -     Urinalysis, Routine w reflex microscopic; Future  Stage 3b chronic kidney disease (HCC) -     Microalbumin / creatinine urine ratio; Future -     CULTURE, URINE COMPREHENSIVE; Future -     IBC + Ferritin; Future  Essential hypertension, benign- BP is well controlled. -     Urinalysis, Routine w reflex microscopic; Future -     Tamsulosin  HCl; Take 1 capsule (0.4 mg total) by mouth every morning.  Dispense: 90 capsule; Refill: 1  Insulin -requiring or dependent type II diabetes mellitus (HCC) -     Microalbumin / creatinine urine ratio; Future -     Urinalysis, Routine w reflex microscopic; Future  Encounter for general adult medical examination with abnormal findings- Exam completed, labs reviewed, vaccines reviewed, no cancer screenings indicated, pt ed material was given.      Follow-up: Return in about 3 months (around 11/30/2023).  Debby Molt, MD

## 2023-08-30 NOTE — Telephone Encounter (Signed)
 Patients daughter states that they have handled this with Nurse Prentiss already.

## 2023-08-30 NOTE — Patient Instructions (Signed)

## 2023-08-31 ENCOUNTER — Encounter: Payer: Self-pay | Admitting: Podiatry

## 2023-08-31 ENCOUNTER — Ambulatory Visit: Admitting: Podiatry

## 2023-08-31 DIAGNOSIS — M79674 Pain in right toe(s): Secondary | ICD-10-CM | POA: Diagnosis not present

## 2023-08-31 DIAGNOSIS — M79675 Pain in left toe(s): Secondary | ICD-10-CM

## 2023-08-31 DIAGNOSIS — E1151 Type 2 diabetes mellitus with diabetic peripheral angiopathy without gangrene: Secondary | ICD-10-CM | POA: Diagnosis not present

## 2023-08-31 DIAGNOSIS — B351 Tinea unguium: Secondary | ICD-10-CM | POA: Diagnosis not present

## 2023-08-31 NOTE — Progress Notes (Signed)
This patient returns to my office for at risk foot care.  This patient requires this care by a professional since this patient will be at risk due to having CKD, PAD and diabetes. This patient is unable to cut nails himself since the patient cannot reach his nails.These nails are painful walking and wearing shoes.  This patient presents for at risk foot care today.  General Appearance  Alert, conversant and in no acute stress.  Vascular  Dorsalis pedis and posterior tibial  pulses are  weakly palpable  bilaterally.  Capillary return is within normal limits  bilaterally. Temperature is within normal limits  bilaterally.  Neurologic  Senn-Weinstein monofilament wire test within normal limits  bilaterally. Muscle power within normal limits bilaterally.  Nails Thick disfigured discolored nails with subungual debris  from hallux to fifth toes bilaterally. No evidence of bacterial infection or drainage bilaterally.  Orthopedic  No limitations of motion  feet .  No crepitus or effusions noted.  No bony pathology or digital deformities noted.  Skin  normotropic skin with no porokeratosis noted bilaterally.  No signs of infections or ulcers noted.     Onychomycosis  Pain in right toes  Pain in left toes  Consent was obtained for treatment procedures.   Mechanical debridement of nails 1-5  bilaterally performed with a nail nipper.  Filed with dremel without incident.    Return office visit      4 months                Told patient to return for periodic foot care and evaluation due to potential at risk complications.   Gardiner Barefoot DPM

## 2023-09-01 LAB — CULTURE, URINE COMPREHENSIVE

## 2023-09-02 ENCOUNTER — Ambulatory Visit: Payer: Self-pay | Admitting: Internal Medicine

## 2023-09-03 ENCOUNTER — Ambulatory Visit

## 2023-09-04 ENCOUNTER — Ambulatory Visit (INDEPENDENT_AMBULATORY_CARE_PROVIDER_SITE_OTHER): Admitting: Podiatry

## 2023-09-04 DIAGNOSIS — Z91198 Patient's noncompliance with other medical treatment and regimen for other reason: Secondary | ICD-10-CM

## 2023-09-04 NOTE — Progress Notes (Signed)
 1. Failure to attend appointment with reason given    Appointment canceled and rescheduled.

## 2023-09-05 ENCOUNTER — Ambulatory Visit (INDEPENDENT_AMBULATORY_CARE_PROVIDER_SITE_OTHER): Admitting: Podiatry

## 2023-09-05 ENCOUNTER — Ambulatory Visit

## 2023-09-05 VITALS — BP 172/77 | HR 74

## 2023-09-05 DIAGNOSIS — R2681 Unsteadiness on feet: Secondary | ICD-10-CM

## 2023-09-05 DIAGNOSIS — R42 Dizziness and giddiness: Secondary | ICD-10-CM | POA: Diagnosis not present

## 2023-09-05 DIAGNOSIS — L600 Ingrowing nail: Secondary | ICD-10-CM

## 2023-09-05 DIAGNOSIS — M79674 Pain in right toe(s): Secondary | ICD-10-CM

## 2023-09-05 MED ORDER — GENTAMICIN SULFATE 0.1 % EX CREA: TOPICAL_CREAM | CUTANEOUS | 1 refills | Status: DC

## 2023-09-05 NOTE — Therapy (Signed)
 OUTPATIENT PHYSICAL THERAPY VESTIBULAR TREATMENT/ DISCHARGE SUMMARY      Patient Name: Michelle Lopez MRN: [id number] DOB:January 11, 1946, 78 y.o., female Today's Date: 09/05/2023  PHYSICAL THERAPY DISCHARGE SUMMARY  Visits from Start of Care: 4  Current functional level related to goals / functional outcomes: See below   Remaining deficits: See below   Education / Equipment: PT POC, exam findings, importance of maintaining at least household mobility, appropriate BP readings   Patient agrees to discharge. Patient goals were partially met. Patient is being discharged due to maximized rehab potential.   END OF SESSION:  PT End of Session - 09/05/23 1156     Visit Number 4    Number of Visits 9    Date for PT Re-Evaluation 09/14/23    Authorization Type [employer] Dual -no auth reqd    PT Start Time 1156   patient late   PT Stop Time 1226    PT Time Calculation (min) 30 min    Equipment Utilized During Treatment Gait belt    Activity Tolerance Patient tolerated treatment well    Behavior During Therapy WFL for tasks assessed/performed;Flat affect          Past Medical History:  Diagnosis Date   Cerebrovascular disease, unspecified    Cocaine abuse, unspecified    Diabetes mellitus    type 2, uncontrolled   Diarrhea    Essential hypertension, benign    Hyperlipidemia    Stroke (HCC)    x7   Past Surgical History:  Procedure Laterality Date   TOTAL HIP ARTHROPLASTY  1970   Dr. Oneil Herald   Patient Active Problem List   Diagnosis Date Noted   Encounter for general adult medical examination with abnormal findings 08/30/2023   Impacted cerumen of both ears 07/24/2023   Insulin -requiring or dependent type II diabetes mellitus (HCC) 02/13/2023   Essential hypertension, benign 11/12/2022   Bilateral hearing loss 05/02/2021   Stage 3b chronic kidney disease (HCC) 04/14/2020   Tinea corporis 12/17/2018   Protein-calorie malnutrition, mild (HCC) 10/17/2017   B12 deficiency  08/23/2017   Primary osteoarthritis of right hip 04/03/2017   PAD (peripheral artery disease) (HCC) 07/24/2016   Vitamin D  deficiency 07/06/2015   Osteopenia, senile 03/03/2014   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 03/21/2013   Seborrheic dermatitis 03/05/2013   Dysphagia as late effect of stroke 05/30/2011   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 Dtr reports that podiatrist cut her toe nails too short and it had to be fixed this morning prior to this appt. Patient agreeable to dc.     Pt accompanied by: family member  PERTINENT HISTORY: cocaine abuse, DMII, HTN, HLD, CVA x7  PAIN:  Are you having pain? No  PRECAUTIONS: Fall   PATIENT GOALS: just walking I guess  OBJECTIVE:  Note: Objective measures were completed at Evaluation unless otherwise noted.  DIAGNOSTIC FINDINGS: 03/27/23 brain MRI  IMPRESSION: 1.  No acute intracranial abnormality. 2. 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    VESTIBULAR ASSESSMENT:  GENERAL OBSERVATION: NAD, using SPC   SYMPTOM BEHAVIOR:  Subjective history: see above  Non-Vestibular symptoms: blurred vision  Type of dizziness: woozy  Frequency: 5x in past week   Duration: few minutes   Aggravating factors: Spontaneous and Induced by motion: bending down to the ground  Relieving factors: no known  relieving factors  Progression of symptoms: unchanged  OCULOMOTOR EXAM:  Ocular Alignment: normal  Ocular ROM: No Limitations  Spontaneous Nystagmus: absent  Gaze-Induced Nystagmus: absent  Smooth Pursuits: saccades in vertical plane, L visual field >R  Saccades: dysmetria  Convergence/Divergence: 6 cm   VESTIBULAR - OCULAR REFLEX:   Slow VOR: Normal  VOR Cancellation: Corrective Saccades  Head-Impulse Test: HIT Right: slow HIT Left: slow    POSITIONAL TESTING: Right Roll Test: no nystagmus Left Roll Test: no nystagmus Right Sidelying: no nystagmus Left Sidelying: no nystagmus  MOTION SENSITIVITY:  Motion Sensitivity Quotient Intensity: 0 = none, 1 = Lightheaded, 2 = Mild, 3 = Moderate, 4 = Severe, 5 = Vomiting  Intensity  1. Sitting to supine 0  2. Supine to L side 0  3. Supine to R side 0  4. Supine to sitting 0  5. L Hallpike-Dix (sidelying) 0  6. Up from L  0  7. R Hallpike-Dix (sidelying) 0  8. Up from R  0  9. Sitting, head tipped to L knee 2  10. Head up from L knee 0  11. Sitting, head tipped to R knee 2  12. Head up from R knee 0  13. Sitting head turns x5 0  14.Sitting head nods x5 0  15. In stance, 180 turn to L  0  16. In stance, 180 turn to R 0    MCTSIB: Condition 1: 30s- mild sway Condition 2: 25s- mild sway                                                                                                                             TREATMENT   Vitals:   09/05/23 1205 09/05/23 1217  BP: (!) 169/76 (!) 172/77  Pulse: 76 74   *sitting (start of session) *pt denies any increased dizziness/lightheadedness from baseline  Theract: -goal assessment (see vestib assessment above)  -habituation  -seated forward bend with gaze fixed on stable target   -initially 2/5, progressing to 3/5- PT questioning BP role in this   -did require Max verbal cues to complete exercise accurately so as to not challenge accommodation    PATIENT  EDUCATION: Education details: PT POC, exam findings Person educated: Patient Education method: Explanation and Handouts Education comprehension: verbalized understanding and needs further education  HOME EXERCISE PROGRAM: Access Code: [email or web address] URL: [email or web address] Date: 08/15/2023 Prepared by: Delon Pop  Exercises - Seated Horizontal Saccades  - 1 x daily - 7 x weekly - 3 sets - 30s  hold - Sit to Stand with Counter Support  - 1 x daily - 7 x weekly - 3 sets - 5 reps  GOALS: Goals reviewed with patient? Yes  SHORT TERM GOALS: = LTG based on PT POC length   LONG TERM GOALS: Target date: 09/14/23  Pt will be independent with final HEP for improved activity tolerance and balance  Baseline: to be provided; provided Goal status: MET  2.  Pt will report </= 1/5 for all movements on MSQ to indicate improvement in motion sensitivity and improved activity tolerance.   Baseline: up to 4/5; 2/5 Goal status: NOT MET  3.  Patient will complete at least 30s on condition 2 of MCTSIB to demonstrate improved balance on solid ground with vision obscured Baseline: 17s; 25s Goal status: NOT MET  ASSESSMENT:  CLINICAL IMPRESSION:  Patient seen for skilled PT session with emphasis on goal assessment and dc. She met 1/3 LTG and did progress toward the remaining ones. Her BP remains rather elevated, likely contributing to at least some of her dizziness. Her vestibular exam will remain positive for central findings given the number and location of her CVAs. Her progress in therapy was stunted by poor attendance and her elevated BP. Patient agreeable to dc at this time.   OBJECTIVE IMPAIRMENTS: Abnormal gait, cardiopulmonary status limiting activity, decreased activity tolerance, decreased balance, decreased cognition, decreased endurance, decreased knowledge of condition, decreased knowledge of use of DME, difficulty walking, decreased strength, dizziness, and postural  dysfunction.   ACTIVITY LIMITATIONS: carrying, bending, stairs, bathing, and locomotion level  PARTICIPATION LIMITATIONS: meal prep, cleaning, interpersonal relationship, and community activity  PERSONAL FACTORS: Age, Fitness, Past/current experiences, and Time since onset of injury/illness/exacerbation are also affecting patient's functional outcome.   REHAB POTENTIAL: Fair time since onset  CLINICAL DECISION MAKING: Evolving/moderate complexity  EVALUATION COMPLEXITY: Moderate   PLAN:  PT FREQUENCY: 2x/week  PT DURATION: 4 weeks  PLANNED INTERVENTIONS: 97164- PT Re-evaluation, 97750- Physical Performance Testing, 97110-Therapeutic exercises, 97530- Therapeutic activity, [id number]- Neuromuscular re-education, 97535- Self Care, 02859- Manual therapy, [id number]- Gait training, [phone]- Orthotic Initial, [phone]- Orthotic/Prosthetic subsequent, [phone]- Canalith repositioning, Patient/Family education, Balance training, Stair training, Vestibular training, and Visual/preceptual remediation/compensation  PLAN FOR NEXT SESSION: dc from PT   Delon DELENA Pop, PT Delon DELENA Pop, PT, DPT, CBIS  09/05/2023, 12:32 PM

## 2023-09-06 ENCOUNTER — Other Ambulatory Visit: Payer: Self-pay | Admitting: Internal Medicine

## 2023-09-06 DIAGNOSIS — E1121 Type 2 diabetes mellitus with diabetic nephropathy: Secondary | ICD-10-CM

## 2023-09-06 DIAGNOSIS — N1832 Chronic kidney disease, stage 3b: Secondary | ICD-10-CM

## 2023-09-07 ENCOUNTER — Encounter: Payer: Self-pay | Admitting: Podiatry

## 2023-09-07 NOTE — Progress Notes (Signed)
  Subjective:  Patient ID: Michelle Lopez, female    DOB: [birth year],  MRN: [id number]  Michelle Lopez presents to clinic today for concern of painful right great toe. Patient was seen on last week and had toenails debrided. Daughter is concerned after patient complained about pain when they got home. She then noticed blood on the side of her right great toe. They deny any redness, drainage or swelling  Chief Complaint  Patient presents with   Wound Check    Right foot great toenail trim wound check. Pt. Seen Dr. Loreda last Friday and he cut nails too short. IDDM A1C 7.5.   PCP is Joshua Debby CROME, MD.  Allergies  Allergen Reactions   Amlodipine  Other (See Comments)    Ankle edema   Crestor  [Rosuvastatin  Calcium ]     myalgias   Morphine  Hives   Pravastatin Sodium Rash   Simvastatin Rash    Review of Systems: Negative except as noted in the HPI.  Objective: No changes noted in today's physical examination. There were no vitals filed for this visit. Michelle Lopez is a pleasant 78 y.o. female thin build in NAD. AAO x 3.  Vascular Examination: CFT <3 seconds b/l. DP/PT pulses faintly palpable. Digital hair absent. Skin temperature gradient warm to warm b/l. No ischemia or gangrene. No cyanosis or clubbing noted b/l. Trace edema noted BLE.   Neurological Examination: Sensation grossly intact b/l with 10 gram monofilament. Vibratory sensation intact b/l.   Dermatological Examination:       Right great toe medial border with evidence of healing laceration of medial border. No erythema, no edema. There is dried heme. There is residual incurvated nail medial border.  Pedal skin thin, shiny and atrophic b/l. No open wounds. No interdigital macerations.       No corns, calluses nor porokeratotic lesions noted.  Musculoskeletal Examination: Muscle strength 4/5 to all lower extremity muscle groups bilaterally. HAV with bunion deformity noted b/l LE. Hammertoe(s) L 2nd toe and R 2nd  toe.  Radiographs: None  Assessment/Plan: 1. Pain of great toe, right     Meds ordered this encounter  Medications   gentamicin  cream (GARAMYCIN ) 0.1 %    Sig: Apply to affected toe once daily.    Dispense:  30 g    Refill:  1    -Patient was evaluated today. All questions/concerns addressed on today's visit. -Patient's family member present. All questions/concerns addressed on today's visit. -Offending nail border right great toe debrided and curretaged. Border cleansed with antibacterial soap followed by alcohol  TAO and band-aid applied. -Daughter instructed to perform epsom salt soaks once daily1-15 minutes for two weeks. Apply prescribed gentamicin  cream once daily. Follow up in 2 weeks for toe check. -Patient/POA to call should there be question/concern in the interim.   Return in about 3 months (around 12/06/2023).  Delon CROME Merlin, DPM      [facility] LOCATION: [address], [place] [address]                   Office [phone]   [facility] LOCATION: [address] [place], [place] [address] Office [phone]

## 2023-09-10 NOTE — Telephone Encounter (Signed)
 Last OV 08/30/23 Next OV 10/31/23  Last refill 06/20/23 Qty #90/0

## 2023-09-12 ENCOUNTER — Other Ambulatory Visit: Payer: Self-pay

## 2023-09-12 NOTE — Patient Outreach (Signed)
 BSW followed up with Ms. Edward's daughter, Marshall, to confirm if they had received the resources previously sent by mail. After a second mailing attempt, Ms. Marshall reported that they had still not received the letter. BSW offered to send the resources via email instead, and Ms. Marshall agreed. BSW sent the following resources via email: [employer], [employer]  [employer], [employer], [employer], [employer]. [employer], and [place] DSS for emergency utility and rental assistance. Ms. Marshall confirmed that they have now received the resources and BSW let her know to reach back out if she had any further questions. Social worker will close out the case.  Orlean Fey, BSW [place]  [facility] Social Worker, [employer] [facility] [phone]

## 2023-09-21 ENCOUNTER — Ambulatory Visit (INDEPENDENT_AMBULATORY_CARE_PROVIDER_SITE_OTHER): Admitting: Podiatry

## 2023-09-21 ENCOUNTER — Other Ambulatory Visit: Payer: Self-pay

## 2023-09-21 DIAGNOSIS — M79674 Pain in right toe(s): Secondary | ICD-10-CM | POA: Diagnosis not present

## 2023-09-21 NOTE — Patient Outreach (Signed)
 Complex Care Management   Visit Note  09/21/2023  Name:  Michelle Lopez MRN: [id number] DOB: Jul 06, 1945  Situation: Referral received for Complex Care Management related to SDOH Barriers: housing, financial resource strain, dementia. I obtained verbal consent from Patient.  Visit completed with daughter/dpr, [name]   on the phone  Background:   Past Medical History:  Diagnosis Date   Cerebrovascular disease, unspecified    Cocaine abuse, unspecified    Diabetes mellitus    type 2, uncontrolled   Diarrhea    Essential hypertension, benign    Hyperlipidemia    Stroke [facility])    x7    Assessment: RNCM spoke with patient and patient's daughter, [name]. Daughter reports patient is doing well. She denies any questions or concerns at this time. Patient Reported Symptoms:  Cognitive Cognitive Status: Requires Assistance Decision Making      Neurological Neurological Review of Symptoms: No symptoms reported (dementia- daughter assist with health management)    HEENT HEENT Symptoms Reported: No symptoms reported      Cardiovascular Cardiovascular Symptoms Reported: No symptoms reported    Respiratory Respiratory Symptoms Reported: No symptoms reported    Endocrine Endocrine Symptoms Reported: No symptoms reported Is patient diabetic?: Yes Is patient checking blood sugars at home?: Yes List most recent blood sugar readings, include date and time of day: yesterday morning BS 145. daughter states patients BS ranging 140-150's.    Gastrointestinal Gastrointestinal Symptoms Reported: No symptoms reported      Genitourinary Genitourinary Symptoms Reported: Incontinence Additional Genitourinary Details: no changes Genitourinary Management Strategies: Incontinence garment/pad  Integumentary Integumentary Symptoms Reported: No symptoms reported    Musculoskeletal Musculoskelatal Symptoms Reviewed: Limited mobility Additional Musculoskeletal Details: daughter reports  patient no longer particpating with therapy. uses Cane when out but does not need assistive device at home. Musculoskeletal Management Strategies: Medical device Musculoskeletal Self-Management Outcome: 4 (good) Falls in the past year?: No    Psychosocial Psychosocial Symptoms Reported: No symptoms reported            08/01/2023    2:15 PM  Depression screen PHQ 2/9  Decreased Interest 0  Down, Depressed, Hopeless 0  PHQ - 2 Score 0  Altered sleeping 0  Tired, decreased energy 0  Change in appetite 0  Feeling bad or failure about yourself  0  Trouble concentrating 0  Moving slowly or fidgety/restless 3  Suicidal thoughts 0  PHQ-9 Score 3  Difficult doing work/chores Extremely dIfficult    There were no vitals filed for this visit.  Medications Reviewed Today     Reviewed by Roark Rufo M, RN (Registered Nurse) on 09/21/23 at 1409  Med List Status: <None>   Medication Order Taking? Sig Documenting Provider Last Dose Status Informant  aspirin  81 MG tablet [id number] Yes Take 81 mg by mouth daily. [provider]  Active Child  atorvastatin  (LIPITOR) 20 MG tablet [id number] Yes TAKE 1 TABLET BY MOUTH EVERY DAY Joshua Debby CROME, MD  Active   Blood Glucose Monitoring Suppl (CONTOUR NEXT EZ) w/Device KIT [id number]  1 Act by Does not apply route 2 (two) times daily. Joshua Debby CROME, MD  Active Child  cholecalciferol (VITAMIN D3) 25 MCG (1000 UNIT) tablet [id number] Yes Take 2,000 Units by mouth daily. [provider]  Active Child  clopidogrel  (PLAVIX ) 75 MG tablet [id number] Yes TAKE 1 TABLET BY MOUTH ONCE DAILY Joshua Debby CROME, MD  Active   Cyanocobalamin  (VITAMIN B 12 PO) [id number] Yes Take by mouth. [provider]  Active Child  FARXIGA  10 MG TABS tablet [id number] Yes TAKE 1 TABLET BY MOUTH EVERY DAY Joshua Debby CROME, MD  Active   gentamicin  cream (GARAMYCIN ) 0.1 % [id number]  Apply to affected toe once daily.  Patient not taking: Reported on 09/21/2023    Gaynel Delon CROME, DPM  Active   Glucagon  (GVOKE HYPOPEN  2-PACK) 1 MG/0.2ML EMMANUEL [id number] Yes Inject 1 Act into the skin daily as needed. Joshua Debby CROME, MD  Active Child  insulin  glargine, 2 Unit Dial , (TOUJEO  MAX SOLOSTAR) 300 UNIT/ML Solostar Pen [id number] Yes Inject 10 Units into the skin daily. Joshua Debby CROME, MD  Active Child  metoprolol  tartrate (LOPRESSOR ) 25 MG tablet [id number] Yes TAKE 1/2 TABLET BY MOUTH 2 TIMES DAILY Joshua Debby CROME, MD  Active   mirtazapine  (REMERON ) 7.5 MG tablet [id number] Yes Take 1 tablet (7.5 mg total) by mouth at bedtime. Joshua Debby CROME, MD  Active   OneTouch UltraSoft 2 Lancets MISC [id number]  USE TO CHECK BLOOD SUGAR 2 TIMES DAILY Joshua Debby CROME, MD  Active Child  tamsulosin  (FLOMAX ) 0.4 MG CAPS capsule [id number] Yes Take 1 capsule (0.4 mg total) by mouth every morning. Joshua Debby CROME, MD  Active   TRUEPLUS 5-BEVEL PEN NEEDLES 31G X 6 MM MISC [id number]  USE TO INJECT insulin  UP TO 5 TIMES DAILY Joshua Debby CROME, MD  Active Child          Recommendation:   Continue Current Plan of Care  Follow Up Plan:   Telephone follow up appointment date/time:  10/22/23 at 2:00 pm  Heddy Shutter, RN, MSN, BSN, CCM [place]  [facility], Population Health Case Manager Phone: [phone]

## 2023-09-21 NOTE — Patient Instructions (Signed)
 Visit Information  Thank you for taking time to visit with me today. Please don't hesitate to contact me if I can be of assistance to you before our next scheduled appointment.  Your next care management appointment is by telephone on 10/22/23 at 2:00 pm   Please call the care guide team at [phone] if you need to cancel, schedule, or reschedule an appointment.   Please call the Suicide and Crisis Lifeline: 988 call the [place]  National Suicide Prevention Lifeline: 631-336-4031 or TTY: 239-437-6218 TTY 713 480 1984) to talk to a trained counselor call 1-800-273-TALK (toll free, 24 hour hotline) if you are experiencing a Mental Health or Behavioral Health Crisis or need someone to talk to.  Heddy Shutter, RN, MSN, BSN, CCM [place]  [facility], Population Health Case Manager Phone: [phone]

## 2023-09-28 ENCOUNTER — Encounter: Payer: Self-pay | Admitting: Podiatry

## 2023-09-28 NOTE — Progress Notes (Signed)
  Subjective:  Patient ID: Michelle Lopez, female    DOB: 12-02-1945,  MRN: [id number]  Michelle Lopez presents to clinic today for follow up toe check of right great toe. Daughter is present during today's visit. Patient states toe feels much better and daughter notes improvement as well  Chief Complaint  Patient presents with   Nail Problem    2 week follow up, right great toe   New problem(s): None.   PCP is Joshua Debby CROME, MD.  Allergies  Allergen Reactions   Amlodipine  Other (See Comments)    Ankle edema   Crestor  [Rosuvastatin  Calcium ]     myalgias   Morphine  Hives   Pravastatin Sodium Rash   Simvastatin Rash    Review of Systems: Negative except as noted in the HPI.  Objective: No changes noted in today's physical examination. There were no vitals filed for this visit. Michelle Lopez is a pleasant 78 y.o. female thin build in NAD. AAO x 3.  Vascular Examination: CFT <3 seconds b/l. DP/PT pulses faintly palpable. Digital hair absent. Skin temperature gradient warm to warm b/l. No ischemia or gangrene. No cyanosis or clubbing noted b/l. Trace edema noted BLE.   Neurological Examination: Sensation grossly intact b/l with 10 gram monofilament. Vibratory sensation intact b/l.   Dermatological Examination:       Right great toe medial border completely healed/epithelialized. No erythema, no edema.   Pedal skin thin, shiny and atrophic b/l. No open wounds. No interdigital macerations.       No corns, calluses nor porokeratotic lesions noted.  Musculoskeletal Examination: Muscle strength 4/5 to all lower extremity muscle groups bilaterally. HAV with bunion deformity noted b/l LE. Hammertoe(s) L 2nd toe and R 2nd toe.  Radiographs: None  Assessment/Plan: 1. Pain of great toe, right     -Patient was evaluated today. All questions/concerns addressed on today's visit. -Patient's family member present. All questions/concerns addressed on today's visit. -Discontinue soaks  and antibiotic cream. -Patient to continue soft, supportive shoe gear daily. -Patient/POA to call should there be question/concern in the interim.   Return in about 3 months (around 12/22/2023).  Delon CROME Merlin, DPM      [place] LOCATION: [address], [place] [address]                   Office [phone]   [facility] LOCATION: [address] [place], [place] [address] Office [phone]

## 2023-10-09 ENCOUNTER — Other Ambulatory Visit: Payer: Self-pay | Admitting: Internal Medicine

## 2023-10-09 DIAGNOSIS — F5104 Psychophysiologic insomnia: Secondary | ICD-10-CM

## 2023-10-09 DIAGNOSIS — I10 Essential (primary) hypertension: Secondary | ICD-10-CM

## 2023-10-09 DIAGNOSIS — E441 Mild protein-calorie malnutrition: Secondary | ICD-10-CM

## 2023-10-09 DIAGNOSIS — E1121 Type 2 diabetes mellitus with diabetic nephropathy: Secondary | ICD-10-CM

## 2023-10-09 DIAGNOSIS — I739 Peripheral vascular disease, unspecified: Secondary | ICD-10-CM

## 2023-10-22 ENCOUNTER — Other Ambulatory Visit: Payer: Self-pay

## 2023-10-22 DIAGNOSIS — F0390 Unspecified dementia without behavioral disturbance: Secondary | ICD-10-CM

## 2023-10-22 NOTE — Patient Outreach (Signed)
 Complex Care Management   Visit Note  10/22/2023  Name:  Smt Lokey MRN: [id number] DOB: 1946/01/29  Situation: Referral received for Complex Care Management related to SDOH, DM dementia I obtained verbal consent from [name] daughter/dpr).  Visit completed with [name]  on the phone  Background:   Past Medical History:  Diagnosis Date   Cerebrovascular disease, unspecified    Cocaine abuse, unspecified    Diabetes mellitus    type 2, uncontrolled   Diarrhea    Essential hypertension, benign    Hyperlipidemia    Stroke [facility])    x7    Assessment: Patients daughter reports patient is doing fine. However, he expresses that patient is sleeping later in the day and likes to come down during the night to get food. RNCM encouraged daughter to follow up with PCP at upcoming appointment scheduled for 10/31/23. Patient Reported Symptoms:  Cognitive Cognitive Status: Requires Assistance Decision Making      Neurological Neurological Review of Symptoms: No symptoms reported    HEENT HEENT Symptoms Reported: No symptoms reported      Cardiovascular Cardiovascular Symptoms Reported: No symptoms reported    Respiratory Respiratory Symptoms Reported: No symptoms reported    Endocrine Endocrine Symptoms Reported: No symptoms reported Is patient diabetic?: Yes Is patient checking blood sugars at home?: Yes List most recent blood sugar readings, include date and time of day: BS yesterday-85 yesterday. denies any signs or symptoms of hypoglycemia    Gastrointestinal Gastrointestinal Symptoms Reported: No symptoms reported      Genitourinary Genitourinary Symptoms Reported: No symptoms reported    Integumentary Integumentary Symptoms Reported: No symptoms reported    Musculoskeletal Musculoskelatal Symptoms Reviewed: No symptoms reported        Psychosocial Psychosocial Symptoms Reported: Not assessed          There were no vitals filed for this  visit.  Medications Reviewed Today     Reviewed by Jonetta Dagley M, RN (Registered Nurse) on 10/22/23 at 1427  Med List Status: <None>   Medication Order Taking? Sig Documenting Provider Last Dose Status Informant  aspirin  81 MG tablet [id number] Yes Take 81 mg by mouth daily. [provider]  Active Child  atorvastatin  (LIPITOR) 20 MG tablet [id number] Yes TAKE 1 TABLET BY MOUTH EVERY DAY Joshua Debby CROME, MD  Active   Blood Glucose Monitoring Suppl (CONTOUR NEXT EZ) w/Device KIT [id number]  1 Act by Does not apply route 2 (two) times daily. Joshua Debby CROME, MD  Active Child  cholecalciferol (VITAMIN D3) 25 MCG (1000 UNIT) tablet [id number] Yes Take 2,000 Units by mouth daily. [provider]  Active Child  clopidogrel  (PLAVIX ) 75 MG tablet [id number] Yes TAKE 1 TABLET BY MOUTH ONCE DAILY Joshua Debby CROME, MD  Active   Cyanocobalamin  (VITAMIN B 12 PO) [id number] Yes Take by mouth. [provider]  Active Child  FARXIGA  10 MG TABS tablet [id number] Yes TAKE 1 TABLET BY MOUTH EVERY DAY Joshua Debby CROME, MD  Active   gentamicin  cream (GARAMYCIN ) 0.1 % [id number]  Apply to affected toe once daily.  Patient not taking: Reported on 10/22/2023   Gaynel Delon CROME, DPM  Active   Glucagon  (GVOKE HYPOPEN  2-PACK) 1 MG/0.2ML EMMANUEL [id number]  Inject 1 Act into the skin daily as needed. Joshua Debby CROME, MD  Active Child  insulin  glargine, 2 Unit Dial , (TOUJEO  MAX SOLOSTAR) 300 UNIT/ML Solostar Pen [id number] Yes Inject 10 Units into the skin daily. Joshua Debby CROME, MD  Active Child  metoprolol  tartrate (LOPRESSOR ) 25 MG tablet [id number] Yes TAKE 1/2 TABLET BY MOUTH 2 TIMES DAILY Joshua Debby CROME, MD  Active   mirtazapine  (REMERON ) 7.5 MG tablet [id number] Yes TAKE 1 TABLET BY MOUTH AT BEDTIME Joshua Debby CROME, MD  Active   OneTouch UltraSoft 2 Lancets MISC [id number]  USE TO CHECK BLOOD SUGAR 2 TIMES DAILY Joshua Debby CROME, MD  Active Child  tamsulosin  (FLOMAX ) 0.4 MG CAPS capsule [id number] Yes  Take 1 capsule (0.4 mg total) by mouth every morning. Joshua Debby CROME, MD  Active   TRUEPLUS 5-BEVEL PEN NEEDLES 31G X 6 MM MISC [id number]  USE TO INJECT insulin  UP TO 5 TIMES DAILY Joshua Debby CROME, MD  Active Child          Recommendation:   Referral to: LCSW for caregiver stress counseling Continue Current Plan of Care  Follow Up Plan:   Telephone follow up appointment date/time:  11/21/23 at 11:00 am  Heddy Shutter, RN, MSN, BSN, CCM [place]  [facility], Population Health Case Manager Phone: [phone]

## 2023-10-22 NOTE — Patient Instructions (Signed)
 Visit Information  Thank you for taking time to visit with me today. Please don't hesitate to contact me if I can be of assistance to you before our next scheduled appointment.  Your next care management appointment is by telephone on 11/21/23 at 11:00 am   Please call the care guide team at [phone] if you need to cancel, schedule, or reschedule an appointment.   Please call the Suicide and Crisis Lifeline: 988 call the [place]  National Suicide Prevention Lifeline: (772)376-4888 or TTY: 250-508-4503 TTY (318)433-5232) to talk to a trained counselor call 1-800-273-TALK (toll free, 24 hour hotline) if you are experiencing a Mental Health or Behavioral Health Crisis or need someone to talk to.  Heddy Shutter, RN, MSN, BSN, CCM   [facility], Population Health Case Manager Phone: [phone]

## 2023-10-31 ENCOUNTER — Ambulatory Visit: Admitting: Internal Medicine

## 2023-11-08 ENCOUNTER — Telehealth: Admitting: *Deleted

## 2023-11-08 ENCOUNTER — Telehealth: Payer: Self-pay | Admitting: Licensed Clinical Social Worker

## 2023-11-08 ENCOUNTER — Encounter: Payer: Self-pay | Admitting: Licensed Clinical Social Worker

## 2023-11-21 ENCOUNTER — Telehealth: Payer: Self-pay

## 2023-11-27 ENCOUNTER — Telehealth: Payer: Self-pay | Admitting: *Deleted

## 2023-11-27 ENCOUNTER — Encounter: Payer: Self-pay | Admitting: *Deleted

## 2023-11-27 NOTE — Patient Instructions (Signed)
 [name] - I am sorry I was unable to reach you today. I work with Joshua Debby CROME, MD and am calling to support your healthcare needs. Please contact me at [phone] at your earliest convenience. I look forward to speaking with you soon.   Thank you,    Audray Rumore, LCSW [place]  [facility], [facility] Licensed Clinical Social Worker  Direct Dial : [phone]

## 2023-11-28 ENCOUNTER — Other Ambulatory Visit: Payer: Self-pay | Admitting: Internal Medicine

## 2023-11-28 DIAGNOSIS — E1121 Type 2 diabetes mellitus with diabetic nephropathy: Secondary | ICD-10-CM

## 2023-11-28 DIAGNOSIS — N1832 Chronic kidney disease, stage 3b: Secondary | ICD-10-CM

## 2023-11-30 ENCOUNTER — Telehealth: Payer: Self-pay

## 2023-11-30 NOTE — Patient Instructions (Signed)
 [name] - I am sorry I was unable to reach you today for our scheduled appointment. I work with Joshua Debby CROME, MD and am calling to support your healthcare needs. Please contact me at [phone] at your earliest convenience. I look forward to speaking with you soon.   Thank you,   Heddy Shutter, RN, MSN, BSN, CCM [place]  [facility], Population Health Case Manager Phone: [phone]

## 2023-12-18 ENCOUNTER — Telehealth: Payer: Self-pay | Admitting: *Deleted

## 2023-12-18 ENCOUNTER — Encounter: Payer: Self-pay | Admitting: *Deleted

## 2023-12-18 NOTE — Patient Instructions (Signed)
 [name] - I have attempted to call you three times but have been unsuccessful in reaching you. I work with Joshua Debby CROME, MD and am calling to support your healthcare needs. If I can be of assistance to you, please contact me at [phone].     Thank you,    Shamus Desantis, LCSW [place]  [facility], [facility] Licensed Clinical Social Worker  Direct Dial : [phone]

## 2023-12-24 ENCOUNTER — Ambulatory Visit: Payer: Self-pay

## 2023-12-24 NOTE — Telephone Encounter (Signed)
 Mother states chest pain last night Daughter took patient's blood pressure 188/85 and then 45 minutes later it was 192/66---this was last night and she did not want to go to the hospital at that time Patient takes Insulin --she has been out for a while maybe 3 weeks  Patient's daughter states that the patient sneaks downstairs at night and sneaks sugary snacks  Patient just wants to sleep all day They have not checked her blood pressure or blood pressure   This RN advised the daughter that at this time with the limited information but with those recent vital signs last night, unknown blood sugar and blood pressure today, along with medical history it is recommended that the patient go to the ER either by family or by ambulance Daughter states she is on the way to the patient's house right now and she will check her blood sugar and blood pressure and if things are still elevated/symptoms present she will convince her mother to go to the ER and/or call 911 This RN advised her to call us  back with any questions or concerns and if anything changes   FYI Only or Action Required?: Action required by provider: clinical question for provider and update on patient condition.  Patient was last seen in primary care on 08/30/2023 by Joshua Debby CROME, MD.  Called Nurse Triage reporting Chest Pain.  Symptoms began yesterday.  Interventions attempted: Nothing.  Symptoms are: unknown.  Triage Disposition: Go to ED Now (or PCP Triage)  Patient/caregiver understands and will follow disposition?: Unsure                  Copied from CRM #[id number]. Topic: Clinical - Red Word Triage >> Dec 24, 2023 10:44 AM Zy'onna H wrote: [facility] that prompted transfer to Nurse Triage:  Pateints daughter stated she has been losing weight, eating not as normal -Chest pain  Requesting an Appointment Requesting Labs Completed Requesting to check A1C   **Transf. To NT** Reason for Disposition  Patient  sounds very sick or weak to the triager  Answer Assessment - Initial Assessment Questions Mother states chest pain last night Daughter took patient's blood pressure 188/85 and then 45 minutes later it was 192/66---this was last night and she did not want to go to the hospital at that time Patient takes Insulin --she has been out for a while maybe 3 weeks  Patient's daughter states that the patient sneaks downstairs at night and sneaks sugary snacks They dont know her current blood sugar Their family has had three deaths recently in a short time frame  Patient just wants to sleep all day They have not checked her blood pressure or blood pressure today   This RN advised the daughter that at this time with the limited information but with those recent vital signs last night, unknown blood sugar and blood pressure today, along with medical history it is recommended that the patient go to the ER either by family or by ambulance Daughter states she is on the way to the patient's house right now and she will check her blood sugar and blood pressure and if things are still elevated/symptoms present she will convince her mother to go to the ER and/or call 911 This RN advised her to call us  back with any questions or concerns and if anything changes  Protocols used: Chest Pain-A-AH

## 2023-12-24 NOTE — Telephone Encounter (Signed)
 Called [facility] to advise them of patient's situation and not wanting to go to the ER last night and with the daughter going to the patient at this time

## 2023-12-28 NOTE — Telephone Encounter (Signed)
 Please advise

## 2023-12-31 ENCOUNTER — Ambulatory Visit: Admitting: Podiatry

## 2024-01-01 ENCOUNTER — Encounter: Payer: Self-pay | Admitting: Podiatry

## 2024-01-01 ENCOUNTER — Ambulatory Visit (INDEPENDENT_AMBULATORY_CARE_PROVIDER_SITE_OTHER): Admitting: Podiatry

## 2024-01-01 DIAGNOSIS — B351 Tinea unguium: Secondary | ICD-10-CM | POA: Diagnosis not present

## 2024-01-01 DIAGNOSIS — E1151 Type 2 diabetes mellitus with diabetic peripheral angiopathy without gangrene: Secondary | ICD-10-CM | POA: Diagnosis not present

## 2024-01-01 DIAGNOSIS — M79675 Pain in left toe(s): Secondary | ICD-10-CM

## 2024-01-01 DIAGNOSIS — M79674 Pain in right toe(s): Secondary | ICD-10-CM | POA: Diagnosis not present

## 2024-01-06 NOTE — Progress Notes (Signed)
  Subjective:  Patient ID: Michelle Lopez, female    DOB: Apr 06, 1945,  MRN: [id number]  Michelle Lopez presents to clinic today for at risk foot care. Pt has h/o NIDDM with PAD and painful thick toenails that are difficult to trim. Pain interferes with ambulation. Aggravating factors include wearing enclosed shoe gear. Pain is relieved with periodic professional debridement.  Chief Complaint  Patient presents with   RFC    RFC. Diabetic A1c 7.6, 81 Mg Asprin Joshua Debby CROME, MD (PCP), 08/30/23.  Pt signed Medicaid ABN 01/01/24.    New problem(s): None.   PCP is Joshua Debby CROME, MD.  Allergies  Allergen Reactions   Amlodipine  Other (See Comments)    Ankle edema   Crestor  [Rosuvastatin  Calcium ]     myalgias   Morphine  Hives   Pravastatin Sodium Rash   Simvastatin Rash    Review of Systems: Negative except as noted in the HPI.  Objective: No changes noted in today's physical examination. There were no vitals filed for this visit. Michelle Lopez is a pleasant 78 y.o. female WD, WN in NAD. AAO x 3.   Vascular Examination: CFT <3 seconds b/l. DP/PT pulses faintly palpable. Digital hair absent. Skin temperature gradient warm to warm b/l. No ischemia or gangrene. No cyanosis or clubbing noted b/l. Trace edema noted BLE.   Neurological Examination: Sensation grossly intact b/l with 10 gram monofilament. Vibratory sensation intact b/l.   Dermatological Examination:       Right great toe medial border completely healed/epithelialized. No erythema, no edema.   Pedal skin thin, shiny and atrophic b/l. No open wounds. No interdigital macerations.       No corns, calluses nor porokeratotic lesions noted.  Musculoskeletal Examination: Muscle strength 4/5 to all lower extremity muscle groups bilaterally. HAV with bunion deformity noted b/l LE. Hammertoe(s) L 2nd toe and R 2nd toe.  Radiographs: None Assessment/Plan: 1. Pain due to onychomycosis of toenails of both feet   2. Type II  diabetes mellitus with peripheral circulatory disorder [facility])   Consent given for treatment. Patient examined. All patient's and/or POA's questions/concerns addressed on today's visit. Toenails 1-5 b/l debrided in length and girth without incident. Continue foot and shoe inspections daily. Monitor blood glucose per PCP/Endocrinologist's recommendations. Continue soft, supportive shoe gear daily. Report any pedal injuries to medical professional. Call office if there are any questions/concerns. -Patient/POA to call should there be question/concern in the interim.   Return in about 3 months (around 04/02/2024).  Michelle Lopez, DPM      [place] LOCATION: [address] [place] [address]                   Office [phone]   [facility] LOCATION: [address] [place], [place] [address] Office [phone]

## 2024-01-09 ENCOUNTER — Other Ambulatory Visit: Payer: Self-pay | Admitting: Internal Medicine

## 2024-01-09 DIAGNOSIS — F5104 Psychophysiologic insomnia: Secondary | ICD-10-CM

## 2024-01-09 DIAGNOSIS — I10 Essential (primary) hypertension: Secondary | ICD-10-CM

## 2024-01-09 DIAGNOSIS — I739 Peripheral vascular disease, unspecified: Secondary | ICD-10-CM

## 2024-01-09 DIAGNOSIS — E1121 Type 2 diabetes mellitus with diabetic nephropathy: Secondary | ICD-10-CM

## 2024-01-09 DIAGNOSIS — E441 Mild protein-calorie malnutrition: Secondary | ICD-10-CM

## 2024-01-16 ENCOUNTER — Other Ambulatory Visit: Payer: Self-pay | Admitting: Internal Medicine

## 2024-01-16 DIAGNOSIS — F5104 Psychophysiologic insomnia: Secondary | ICD-10-CM

## 2024-01-16 DIAGNOSIS — E441 Mild protein-calorie malnutrition: Secondary | ICD-10-CM

## 2024-01-16 DIAGNOSIS — E1121 Type 2 diabetes mellitus with diabetic nephropathy: Secondary | ICD-10-CM

## 2024-01-16 DIAGNOSIS — I739 Peripheral vascular disease, unspecified: Secondary | ICD-10-CM

## 2024-01-16 DIAGNOSIS — I10 Essential (primary) hypertension: Secondary | ICD-10-CM

## 2024-01-25 ENCOUNTER — Other Ambulatory Visit: Payer: Self-pay | Admitting: Internal Medicine

## 2024-01-28 ENCOUNTER — Ambulatory Visit: Admitting: Nurse Practitioner

## 2024-01-29 ENCOUNTER — Telehealth: Payer: Self-pay

## 2024-01-29 ENCOUNTER — Other Ambulatory Visit: Payer: Self-pay | Admitting: Internal Medicine

## 2024-01-29 DIAGNOSIS — E441 Mild protein-calorie malnutrition: Secondary | ICD-10-CM

## 2024-01-29 DIAGNOSIS — I10 Essential (primary) hypertension: Secondary | ICD-10-CM

## 2024-01-29 DIAGNOSIS — F5104 Psychophysiologic insomnia: Secondary | ICD-10-CM

## 2024-01-29 DIAGNOSIS — I739 Peripheral vascular disease, unspecified: Secondary | ICD-10-CM

## 2024-01-29 DIAGNOSIS — E1121 Type 2 diabetes mellitus with diabetic nephropathy: Secondary | ICD-10-CM

## 2024-01-29 NOTE — Telephone Encounter (Signed)
 Copied from CRM #[id number]. Topic: General - Other >> Jan 29, 2024  3:31 PM Burnard DEL wrote: Reason for CRM: [employer] called in stating that they received order back for patients incontinence supplies,however they are missing some information.He stated that there was 2 questions on the CSRA form. Questions 19 and 20,and all rest could be ignored. He stated that he refaxed the information to the office again on the 17th as well.However he will be refaxing again today just in case office did not receive.

## 2024-02-04 ENCOUNTER — Ambulatory Visit: Admitting: Family Medicine

## 2024-02-04 ENCOUNTER — Other Ambulatory Visit: Payer: Self-pay | Admitting: Family Medicine

## 2024-02-04 ENCOUNTER — Encounter: Payer: Self-pay | Admitting: Family Medicine

## 2024-02-04 VITALS — BP 140/80 | HR 67 | Temp 98.3°F | Resp 17 | Ht 63.0 in | Wt 117.0 lb

## 2024-02-04 DIAGNOSIS — N1832 Chronic kidney disease, stage 3b: Secondary | ICD-10-CM

## 2024-02-04 DIAGNOSIS — Z23 Encounter for immunization: Secondary | ICD-10-CM | POA: Diagnosis not present

## 2024-02-04 DIAGNOSIS — E119 Type 2 diabetes mellitus without complications: Secondary | ICD-10-CM

## 2024-02-04 DIAGNOSIS — I10 Essential (primary) hypertension: Secondary | ICD-10-CM

## 2024-02-04 DIAGNOSIS — I69391 Dysphagia following cerebral infarction: Secondary | ICD-10-CM | POA: Diagnosis not present

## 2024-02-04 DIAGNOSIS — Z79899 Other long term (current) drug therapy: Secondary | ICD-10-CM

## 2024-02-04 DIAGNOSIS — Z794 Long term (current) use of insulin: Secondary | ICD-10-CM

## 2024-02-04 DIAGNOSIS — E1121 Type 2 diabetes mellitus with diabetic nephropathy: Secondary | ICD-10-CM

## 2024-02-04 DIAGNOSIS — F0390 Unspecified dementia without behavioral disturbance: Secondary | ICD-10-CM | POA: Diagnosis not present

## 2024-02-04 LAB — CBC WITH DIFFERENTIAL/PLATELET
Basophils Absolute: 0.1 K/uL (ref 0.0–0.1)
Basophils Relative: 1.1 % (ref 0.0–3.0)
Eosinophils Absolute: 0 K/uL (ref 0.0–0.7)
Eosinophils Relative: 0.5 % (ref 0.0–5.0)
HCT: 47.1 % — ABNORMAL HIGH (ref 36.0–46.0)
Hemoglobin: 15.2 g/dL — ABNORMAL HIGH (ref 12.0–15.0)
Lymphocytes Relative: 52.3 % — ABNORMAL HIGH (ref 12.0–46.0)
Lymphs Abs: 3.1 K/uL (ref 0.7–4.0)
MCHC: 32.2 g/dL (ref 30.0–36.0)
MCV: 80.6 fl (ref 78.0–100.0)
Monocytes Absolute: 0.5 K/uL (ref 0.1–1.0)
Monocytes Relative: 8.4 % (ref 3.0–12.0)
Neutro Abs: 2.2 K/uL (ref 1.4–7.7)
Neutrophils Relative %: 37.7 % — ABNORMAL LOW (ref 43.0–77.0)
Platelets: 197 K/uL (ref 150.0–400.0)
RBC: 5.84 Mil/uL — ABNORMAL HIGH (ref 3.87–5.11)
RDW: 13.3 % (ref 11.5–15.5)
WBC: 5.8 K/uL (ref 4.0–10.5)

## 2024-02-04 LAB — COMPREHENSIVE METABOLIC PANEL WITH GFR
ALT: 9 U/L (ref 3–35)
AST: 14 U/L (ref 5–37)
Albumin: 4.2 g/dL (ref 3.5–5.2)
Alkaline Phosphatase: 58 U/L (ref 39–117)
BUN: 13 mg/dL (ref 6–23)
CO2: 30 meq/L (ref 19–32)
Calcium: 9.4 mg/dL (ref 8.4–10.5)
Chloride: 102 meq/L (ref 96–112)
Creatinine, Ser: 0.95 mg/dL (ref 0.40–1.20)
GFR: 56.4 mL/min — ABNORMAL LOW
Glucose, Bld: 154 mg/dL — ABNORMAL HIGH (ref 70–99)
Potassium: 3.8 meq/L (ref 3.5–5.1)
Sodium: 141 meq/L (ref 135–145)
Total Bilirubin: 0.5 mg/dL (ref 0.2–1.2)
Total Protein: 7.7 g/dL (ref 6.0–8.3)

## 2024-02-04 LAB — HEMOGLOBIN A1C: Hgb A1c MFr Bld: 8.1 % — ABNORMAL HIGH (ref 4.6–6.5)

## 2024-02-04 LAB — MICROALBUMIN / CREATININE URINE RATIO
Creatinine,U: 81.8 mg/dL
Microalb Creat Ratio: 53.4 mg/g — ABNORMAL HIGH (ref 0.0–30.0)
Microalb, Ur: 4.4 mg/dL — ABNORMAL HIGH (ref 0.7–1.9)

## 2024-02-04 LAB — TSH: TSH: 1.44 u[IU]/mL (ref 0.35–5.50)

## 2024-02-04 MED ORDER — DAPAGLIFLOZIN PROPANEDIOL 10 MG PO TABS: 10.0000 mg | ORAL_TABLET | Freq: Every day | ORAL | 1 refills | Status: AC

## 2024-02-04 MED ORDER — CONTOUR NEXT TEST VI STRP: ORAL_STRIP | 2 refills | Status: DC

## 2024-02-04 MED ORDER — TOUJEO MAX SOLOSTAR 300 UNIT/ML ~~LOC~~ SOPN: 10.0000 [IU] | PEN_INJECTOR | Freq: Every day | SUBCUTANEOUS | 0 refills | Status: AC

## 2024-02-04 MED ORDER — TAMSULOSIN HCL 0.4 MG PO CAPS: 0.4000 mg | ORAL_CAPSULE | Freq: Every morning | ORAL | 1 refills | Status: AC

## 2024-02-04 MED ORDER — ATORVASTATIN CALCIUM 20 MG PO TABS: 20.0000 mg | ORAL_TABLET | Freq: Every day | ORAL | 1 refills | Status: AC

## 2024-02-04 NOTE — Patient Instructions (Addendum)
 Continue current medication regimen.   I have sent in refills to your pharmacy today.  Follow up with specialists as scheduled.   We are checking labs today, will be in contact with any results that require further attention  We have given your flu vaccine today.   Follow-up with PCP in 6 mos

## 2024-02-04 NOTE — Telephone Encounter (Signed)
 Once we receive the form Then we will complete it

## 2024-02-04 NOTE — Progress Notes (Unsigned)
 "  Established Patient Office Visit  Subjective:     Patient ID: Michelle Lopez, female    DOB: 1945/10/14, 78 y.o.   MRN: [id number]  Chief Complaint  Patient presents with   Follow-up    Medication renewals hasn't had BP meds since Saturday unsure of the last time she had insulin      HPI  Discussed the use of AI scribe software for clinical note transcription with the patient, who gave verbal consent to proceed.  History of Present Illness Michelle Lopez is an 78 year old female who presents for medication refills and insulin  management.  Diabetes mellitus management - Uses Toujeo  insulin  10 units daily for glycemic control. - Difficulty obtaining refills for Toujeo  insulin  due to pharmacy requirements for new prescriptions. - Caregiver unable to obtain test strips and pen supplies for insulin  administration. - Blood glucose monitored with Contour meter.  Medication access and cost concerns - Unable to obtain refills for Farxiga  due to pharmacy requiring new prescriptions. - Medication cost is a concern, with pharmacy quoting high prices for some prescriptions.  Current medication regimen - Currently taking metoprolol , Remeron , and Flomax  in addition to Toujeo  insulin .     ROS Per HPI      Objective:    BP (!) 140/80 (BP Location: Right Arm, Patient Position: Sitting)   Pulse 67   Temp 98.3 F (36.8 C) (Temporal)   Resp 17   Ht 5' 3 (1.6 m)   Wt 117 lb (53.1 kg)   SpO2 97%   BMI 20.73 kg/m    Physical Exam Vitals and nursing note reviewed.  Constitutional:      General: She is not in acute distress.    Appearance: Normal appearance. She is normal weight.  HENT:     Head: Normocephalic and atraumatic.     Right Ear: External ear normal.     Left Ear: External ear normal.     Nose: Nose normal.     Mouth/Throat:     Mouth: Mucous membranes are moist.     Pharynx: Oropharynx is clear.  Eyes:     Extraocular Movements: Extraocular movements intact.      Pupils: Pupils are equal, round, and reactive to light.  Cardiovascular:     Rate and Rhythm: Normal rate and regular rhythm.     Pulses: Normal pulses.     Heart sounds: Normal heart sounds.  Pulmonary:     Effort: Pulmonary effort is normal. No respiratory distress.     Breath sounds: Normal breath sounds. No wheezing, rhonchi or rales.  Musculoskeletal:        General: Normal range of motion.     Cervical back: Normal range of motion.     Right lower leg: No edema.     Left lower leg: No edema.  Lymphadenopathy:     Cervical: No cervical adenopathy.  Neurological:     General: No focal deficit present.     Mental Status: She is alert and oriented to person, place, and time.  Psychiatric:        Mood and Affect: Mood normal.        Thought Content: Thought content normal.     No results found for any visits on 02/04/24.  The ASCVD Risk score (Arnett DK, et al., 2019) failed to calculate for the following reasons:   The 2019 ASCVD risk score is only valid for ages 47 to 73   Risk score cannot be calculated because patient has a  medical history suggesting prior/existing ASCVD   * - Cholesterol units were assumed  BP Readings from Last 3 Encounters:  02/04/24 (!) 140/80  09/05/23 (!) 172/77  08/30/23 136/74   Wt Readings from Last 3 Encounters:  02/04/24 117 lb (53.1 kg)  08/30/23 115 lb 6.4 oz (52.3 kg)  08/01/23 117 lb 6.4 oz (53.3 kg)      Last CBC Lab Results  Component Value Date   WBC 7.9 03/26/2023   HGB 15.3 (H) 03/26/2023   HCT 49.3 (H) 03/26/2023   MCV 82.7 03/26/2023   MCH 25.7 (L) 03/26/2023   RDW 13.8 03/26/2023   PLT 280 03/26/2023   Last metabolic panel Lab Results  Component Value Date   GLUCOSE 123 (H) 07/26/2023   NA 141 07/26/2023   K 4.1 07/26/2023   CL 102 07/26/2023   CO2 29 07/26/2023   BUN 18 07/26/2023   CREATININE 1.04 07/26/2023   GFR 50.78 (L) 07/26/2023   CALCIUM  9.8 07/26/2023   PHOS 2.5 04/08/2019   PROT 7.6 03/26/2023    ALBUMIN 3.3 (L) 03/26/2023   BILITOT 1.2 03/26/2023   ALKPHOS 50 03/26/2023   AST 17 03/26/2023   ALT 10 03/26/2023   ANIONGAP 14 03/26/2023   Last lipids Lab Results  Component Value Date   CHOL 179 06/01/2022   HDL 37.20 (L) 06/01/2022   LDLCALC 104 (H) 06/01/2022   LDLDIRECT 100.0 07/06/2015   TRIG 190.0 (H) 06/01/2022   CHOLHDL 5 06/01/2022   Last hemoglobin A1c Lab Results  Component Value Date   HGBA1C 7.6 (H) 07/26/2023   Last thyroid  functions Lab Results  Component Value Date   TSH 1.98 08/30/2023   FREET4 0.83 02/22/2022   Last vitamin D  Lab Results  Component Value Date   VD25OH 45.01 02/22/2022   Last vitamin B12 and Folate Lab Results  Component Value Date   VITAMINB12 >1500 (H) 06/01/2022   FOLATE 10.3 06/01/2022         Assessment & Plan:   Assessment and Plan Assessment & Plan Type 2 diabetes mellitus Requiring insulin  therapy. Issues with medication refills for insulin , Toujeo , and Farxiga . - Sent prescription for Toujeo  and Farxiga  to pharmacy. - Ensured insulin  delivery schedule is maintained.  General health maintenance Discussed flu vaccination and TB skin test with caregiver. - Administered flu shot. - Discussed TB skin test with caregiver.     Orders Placed This Encounter  Procedures   CBC w/Diff   Comprehensive metabolic panel with GFR    Release to patient:   Immediate [1]   Hemoglobin A1c   TSH   Microalbumin / creatinine urine ratio    Release to patient:   Immediate     Meds ordered this encounter  Medications   glucose blood (CONTOUR NEXT TEST) test strip    Sig: Use as instructed    Dispense:  600 each    Refill:  2   dapagliflozin  propanediol (FARXIGA ) 10 MG TABS tablet    Sig: Take 1 tablet (10 mg total) by mouth daily.    Dispense:  90 tablet    Refill:  1    Please deliver   insulin  glargine, 2 Unit Dial , (TOUJEO  MAX SOLOSTAR) 300 UNIT/ML Solostar Pen    Sig: Inject 10 Units into the skin daily.     Dispense:  6 mL    Refill:  0    This prescription was filled on 11/06/2022. Any refills authorized will be placed on file.   tamsulosin  (  FLOMAX ) 0.4 MG CAPS capsule    Sig: Take 1 capsule (0.4 mg total) by mouth every morning.    Dispense:  90 capsule    Refill:  1   atorvastatin  (LIPITOR) 20 MG tablet    Sig: Take 1 tablet (20 mg total) by mouth daily.    Dispense:  90 tablet    Refill:  1    No follow-ups on file.  Corean LITTIE Ku, FNP   "

## 2024-02-05 ENCOUNTER — Telehealth: Payer: Self-pay

## 2024-02-05 NOTE — Telephone Encounter (Signed)
 Copied from CRM #[id number]. Topic: General - Other >> Jan 29, 2024  3:31 PM Burnard DEL wrote: Reason for CRM: [employer] called in stating that they received order back for patients incontinence supplies,however they are missing some information.He stated that there was 2 questions on the [employer] form. Questions 19 and 20,and all rest could be ignored. He stated that he refaxed the information to the office again on the 17th as well.However he will be refaxing again today just in case office did not receive. >> Feb 05, 2024  9:43 AM Shanda MATSU wrote: Caller is calling to check status on incomplete form for incontinence supplies for member, adv caller that I do not show any notes that form has been recvd again or the missing questions completed, adv caller that I will have nurse contact him back with a status update.

## 2024-02-06 NOTE — Telephone Encounter (Signed)
 Form was received and sat on dr joshua desk 02/05/2024.

## 2024-02-08 ENCOUNTER — Ambulatory Visit: Payer: Self-pay | Admitting: Family Medicine

## 2024-02-12 ENCOUNTER — Ambulatory Visit: Admitting: Internal Medicine

## 2024-02-13 NOTE — Telephone Encounter (Signed)
 Dr. Joshua still has her paperwork.

## 2024-02-18 NOTE — Telephone Encounter (Signed)
 This has been completed and faxed back.

## 2024-02-19 ENCOUNTER — Ambulatory Visit: Admitting: Internal Medicine

## 2024-02-22 NOTE — Telephone Encounter (Signed)
 Copied from CRM [id number]. Topic: Clinical - Medication Question >> Feb 22, 2024 10:29 AM Viola FALCON wrote: Reason for CRM: Patient daughter Lynda called regarding patients glucose meter, they cannot find it and really need another one sent to the [facility] on file along with the test strips so she can check patients sugar. She would like it sent today. Please call her at [phone] [facility])

## 2024-02-29 ENCOUNTER — Other Ambulatory Visit: Payer: Self-pay

## 2024-02-29 DIAGNOSIS — E1121 Type 2 diabetes mellitus with diabetic nephropathy: Secondary | ICD-10-CM

## 2024-02-29 MED ORDER — BLOOD GLUCOSE TEST VI STRP: 1.0000 | ORAL_STRIP | 0 refills | Status: AC

## 2024-02-29 MED ORDER — LANCET DEVICE MISC: 1.0000 | 0 refills | Status: AC

## 2024-02-29 MED ORDER — BLOOD GLUCOSE MONITORING SUPPL DEVI: 1.0000 | 0 refills | Status: AC

## 2024-02-29 MED ORDER — LANCETS MISC: 1.0000 | 0 refills | Status: AC

## 2024-02-29 NOTE — Progress Notes (Signed)
 Resent

## 2024-04-16 ENCOUNTER — Ambulatory Visit: Admitting: Podiatry

## 2024-05-12 ENCOUNTER — Ambulatory Visit: Admitting: Internal Medicine

## 2024-08-04 ENCOUNTER — Ambulatory Visit: Admitting: Internal Medicine

## 2024-08-07 ENCOUNTER — Ambulatory Visit
